# Patient Record
Sex: Female | Born: 1945 | Race: White | Hispanic: No | Smoking: Never smoker
Health system: Southern US, Community
[De-identification: ages and names within clinical notes are randomized; demographics above are authoritative.]

## PROBLEM LIST (undated history)

## (undated) DIAGNOSIS — T7840XA Allergy, unspecified, initial encounter: Secondary | ICD-10-CM

## (undated) DIAGNOSIS — I10 Essential (primary) hypertension: Secondary | ICD-10-CM

## (undated) DIAGNOSIS — Z8489 Family history of other specified conditions: Secondary | ICD-10-CM

## (undated) DIAGNOSIS — F329 Major depressive disorder, single episode, unspecified: Secondary | ICD-10-CM

## (undated) DIAGNOSIS — B019 Varicella without complication: Secondary | ICD-10-CM

## (undated) DIAGNOSIS — K635 Polyp of colon: Secondary | ICD-10-CM

## (undated) DIAGNOSIS — S46012A Strain of muscle(s) and tendon(s) of the rotator cuff of left shoulder, initial encounter: Secondary | ICD-10-CM

## (undated) DIAGNOSIS — F32A Depression, unspecified: Secondary | ICD-10-CM

## (undated) DIAGNOSIS — K5790 Diverticulosis of intestine, part unspecified, without perforation or abscess without bleeding: Secondary | ICD-10-CM

## (undated) DIAGNOSIS — K224 Dyskinesia of esophagus: Secondary | ICD-10-CM

## (undated) DIAGNOSIS — K219 Gastro-esophageal reflux disease without esophagitis: Secondary | ICD-10-CM

## (undated) DIAGNOSIS — K5792 Diverticulitis of intestine, part unspecified, without perforation or abscess without bleeding: Secondary | ICD-10-CM

## (undated) DIAGNOSIS — K222 Esophageal obstruction: Secondary | ICD-10-CM

## (undated) DIAGNOSIS — M4302 Spondylolysis, cervical region: Secondary | ICD-10-CM

## (undated) DIAGNOSIS — R011 Cardiac murmur, unspecified: Secondary | ICD-10-CM

## (undated) DIAGNOSIS — M1990 Unspecified osteoarthritis, unspecified site: Secondary | ICD-10-CM

## (undated) DIAGNOSIS — H40009 Preglaucoma, unspecified, unspecified eye: Secondary | ICD-10-CM

## (undated) DIAGNOSIS — H269 Unspecified cataract: Secondary | ICD-10-CM

## (undated) DIAGNOSIS — F419 Anxiety disorder, unspecified: Secondary | ICD-10-CM

## (undated) DIAGNOSIS — M810 Age-related osteoporosis without current pathological fracture: Secondary | ICD-10-CM

## (undated) HISTORY — PX: NECK SURGERY: SHX720

## (undated) HISTORY — DX: Diverticulitis of intestine, part unspecified, without perforation or abscess without bleeding: K57.92

## (undated) HISTORY — DX: Anxiety disorder, unspecified: F41.9

## (undated) HISTORY — DX: Polyp of colon: K63.5

## (undated) HISTORY — DX: Depression, unspecified: F32.A

## (undated) HISTORY — DX: Major depressive disorder, single episode, unspecified: F32.9

## (undated) HISTORY — DX: Allergy, unspecified, initial encounter: T78.40XA

## (undated) HISTORY — DX: Age-related osteoporosis without current pathological fracture: M81.0

## (undated) HISTORY — DX: Gastro-esophageal reflux disease without esophagitis: K21.9

## (undated) HISTORY — DX: Varicella without complication: B01.9

## (undated) HISTORY — DX: Essential (primary) hypertension: I10

## (undated) HISTORY — DX: Unspecified cataract: H26.9

## (undated) HISTORY — DX: Diverticulosis of intestine, part unspecified, without perforation or abscess without bleeding: K57.90

## (undated) HISTORY — DX: Esophageal obstruction: K22.2

## (undated) HISTORY — DX: Cardiac murmur, unspecified: R01.1

---

## 1988-03-29 HISTORY — PX: HERNIA REPAIR: SHX51

## 1994-03-29 HISTORY — PX: OTHER SURGICAL HISTORY: SHX169

## 1998-03-29 HISTORY — PX: EYE SURGERY: SHX253

## 1998-07-24 ENCOUNTER — Other Ambulatory Visit: Admission: RE | Admit: 1998-07-24 | Discharge: 1998-07-24 | Payer: Self-pay | Admitting: Oral Surgery

## 1999-02-17 ENCOUNTER — Ambulatory Visit (HOSPITAL_COMMUNITY): Admission: RE | Admit: 1999-02-17 | Discharge: 1999-02-18 | Payer: Self-pay | Admitting: Ophthalmology

## 1999-02-17 ENCOUNTER — Encounter: Payer: Self-pay | Admitting: Ophthalmology

## 1999-09-16 ENCOUNTER — Other Ambulatory Visit: Admission: RE | Admit: 1999-09-16 | Discharge: 1999-09-16 | Payer: Self-pay | Admitting: Obstetrics and Gynecology

## 2000-03-23 ENCOUNTER — Other Ambulatory Visit: Admission: RE | Admit: 2000-03-23 | Discharge: 2000-03-23 | Payer: Self-pay | Admitting: Obstetrics and Gynecology

## 2000-09-05 ENCOUNTER — Encounter: Admission: RE | Admit: 2000-09-05 | Discharge: 2000-09-05 | Payer: Self-pay | Admitting: Internal Medicine

## 2000-09-05 ENCOUNTER — Encounter: Payer: Self-pay | Admitting: Internal Medicine

## 2000-10-10 ENCOUNTER — Other Ambulatory Visit: Admission: RE | Admit: 2000-10-10 | Discharge: 2000-10-10 | Payer: Self-pay | Admitting: Obstetrics and Gynecology

## 2001-04-17 ENCOUNTER — Other Ambulatory Visit: Admission: RE | Admit: 2001-04-17 | Discharge: 2001-04-17 | Payer: Self-pay | Admitting: Obstetrics and Gynecology

## 2002-03-19 ENCOUNTER — Other Ambulatory Visit: Admission: RE | Admit: 2002-03-19 | Discharge: 2002-03-19 | Payer: Self-pay | Admitting: Obstetrics and Gynecology

## 2004-04-13 ENCOUNTER — Other Ambulatory Visit: Admission: RE | Admit: 2004-04-13 | Discharge: 2004-04-13 | Payer: Self-pay | Admitting: Obstetrics and Gynecology

## 2005-06-10 ENCOUNTER — Other Ambulatory Visit: Admission: RE | Admit: 2005-06-10 | Discharge: 2005-06-10 | Payer: Self-pay | Admitting: Obstetrics and Gynecology

## 2005-06-17 ENCOUNTER — Encounter: Admission: RE | Admit: 2005-06-17 | Discharge: 2005-06-17 | Payer: Self-pay | Admitting: Internal Medicine

## 2005-07-16 ENCOUNTER — Ambulatory Visit: Payer: Self-pay | Admitting: Internal Medicine

## 2005-07-27 LAB — HM COLONOSCOPY

## 2005-07-29 ENCOUNTER — Ambulatory Visit: Payer: Self-pay | Admitting: Internal Medicine

## 2008-10-09 ENCOUNTER — Encounter: Admission: RE | Admit: 2008-10-09 | Discharge: 2008-10-09 | Payer: Self-pay | Admitting: Obstetrics and Gynecology

## 2009-11-25 ENCOUNTER — Encounter: Admission: RE | Admit: 2009-11-25 | Discharge: 2009-11-25 | Payer: Self-pay | Admitting: Obstetrics and Gynecology

## 2010-01-02 ENCOUNTER — Emergency Department (HOSPITAL_BASED_OUTPATIENT_CLINIC_OR_DEPARTMENT_OTHER): Admission: EM | Admit: 2010-01-02 | Discharge: 2010-01-02 | Payer: Self-pay | Admitting: Emergency Medicine

## 2010-06-11 LAB — COMPREHENSIVE METABOLIC PANEL
AST: 24 U/L (ref 0–37)
BUN: 18 mg/dL (ref 6–23)
CO2: 25 mEq/L (ref 19–32)
Calcium: 8.6 mg/dL (ref 8.4–10.5)
Creatinine, Ser: 0.6 mg/dL (ref 0.4–1.2)
GFR calc Af Amer: >60 mL/min (ref >60)
GFR calc non Af Amer: >60 mL/min (ref >60)
Total Bilirubin: 0.6 mg/dL (ref 0.3–1.2)

## 2010-06-11 LAB — CBC
HCT: 38.7 % (ref 36.0–46.0)
Hemoglobin: 12.8 g/dL (ref 12.0–15.0)
MCH: 30.5 pg (ref 26.0–34.0)
MCHC: 33 g/dL (ref 30.0–36.0)
MCV: 92 fL (ref 78.0–100.0)
Platelets: 300 10*3/uL (ref 150–400)
RBC: 4.19 MIL/uL (ref 3.87–5.11)
RDW: 11.6 % (ref 11.5–15.5)
WBC: 9.6 10*3/uL (ref 4.0–10.5)

## 2010-06-11 LAB — DIFFERENTIAL
Blasts: 0 %
Eosinophils Absolute: 0.3 10*3/uL (ref 0.0–0.7)
Eosinophils Relative: 3 % (ref 0–5)
Lymphocytes Relative: 16 % (ref 12–46)
Lymphs Abs: 1.5 10*3/uL (ref 0.7–4.0)
Monocytes Absolute: 0.4 10*3/uL (ref 0.1–1.0)
Monocytes Relative: 4 % (ref 3–12)
Neutro Abs: 7.3 10*3/uL (ref 1.7–7.7)
Neutrophils Relative %: 76 % (ref 43–77)
nRBC: 0 /100 WBC

## 2010-11-09 ENCOUNTER — Other Ambulatory Visit: Payer: Self-pay | Admitting: Obstetrics and Gynecology

## 2010-11-09 DIAGNOSIS — Z1231 Encounter for screening mammogram for malignant neoplasm of breast: Secondary | ICD-10-CM

## 2010-12-01 ENCOUNTER — Ambulatory Visit
Admission: RE | Admit: 2010-12-01 | Discharge: 2010-12-01 | Disposition: A | Discharge status: Home / Self Care | Payer: BC Managed Care – PPO | Source: Ambulatory Visit | Attending: Obstetrics and Gynecology | Admitting: Obstetrics and Gynecology

## 2010-12-01 DIAGNOSIS — Z1231 Encounter for screening mammogram for malignant neoplasm of breast: Secondary | ICD-10-CM

## 2011-12-08 ENCOUNTER — Other Ambulatory Visit: Payer: Self-pay | Admitting: Obstetrics and Gynecology

## 2011-12-08 DIAGNOSIS — Z1231 Encounter for screening mammogram for malignant neoplasm of breast: Secondary | ICD-10-CM

## 2011-12-10 ENCOUNTER — Ambulatory Visit
Admission: RE | Admit: 2011-12-10 | Discharge: 2011-12-10 | Disposition: A | Discharge status: Home / Self Care | Payer: BC Managed Care – PPO | Source: Ambulatory Visit | Attending: Obstetrics and Gynecology | Admitting: Obstetrics and Gynecology

## 2011-12-10 DIAGNOSIS — Z1231 Encounter for screening mammogram for malignant neoplasm of breast: Secondary | ICD-10-CM

## 2011-12-14 ENCOUNTER — Other Ambulatory Visit: Payer: Self-pay | Admitting: Obstetrics and Gynecology

## 2011-12-14 DIAGNOSIS — R928 Other abnormal and inconclusive findings on diagnostic imaging of breast: Secondary | ICD-10-CM

## 2011-12-15 ENCOUNTER — Ambulatory Visit
Admission: RE | Admit: 2011-12-15 | Discharge: 2011-12-15 | Disposition: A | Discharge status: Home / Self Care | Payer: BC Managed Care – PPO | Source: Ambulatory Visit | Attending: Obstetrics and Gynecology | Admitting: Obstetrics and Gynecology

## 2011-12-15 ENCOUNTER — Other Ambulatory Visit: Payer: Self-pay | Admitting: Obstetrics and Gynecology

## 2011-12-15 DIAGNOSIS — R928 Other abnormal and inconclusive findings on diagnostic imaging of breast: Secondary | ICD-10-CM

## 2012-11-10 ENCOUNTER — Other Ambulatory Visit: Payer: Self-pay

## 2012-11-10 DIAGNOSIS — Z1231 Encounter for screening mammogram for malignant neoplasm of breast: Secondary | ICD-10-CM

## 2012-11-23 ENCOUNTER — Ambulatory Visit (INDEPENDENT_AMBULATORY_CARE_PROVIDER_SITE_OTHER): Payer: BC Managed Care – PPO | Admitting: Family Medicine

## 2012-11-23 ENCOUNTER — Encounter: Payer: Self-pay | Admitting: Family Medicine

## 2012-11-23 VITALS — BP 118/80 | HR 74 | Temp 97.9°F | Ht 60.0 in | Wt 122.0 lb

## 2012-11-23 DIAGNOSIS — I10 Essential (primary) hypertension: Secondary | ICD-10-CM

## 2012-11-23 DIAGNOSIS — M546 Pain in thoracic spine: Secondary | ICD-10-CM

## 2012-11-23 DIAGNOSIS — R002 Palpitations: Secondary | ICD-10-CM

## 2012-11-23 DIAGNOSIS — Z8659 Personal history of other mental and behavioral disorders: Secondary | ICD-10-CM

## 2012-11-23 DIAGNOSIS — M545 Low back pain, unspecified: Secondary | ICD-10-CM

## 2012-11-23 DIAGNOSIS — K5732 Diverticulitis of large intestine without perforation or abscess without bleeding: Secondary | ICD-10-CM

## 2012-11-23 NOTE — Progress Notes (Signed)
  Subjective:    Patient ID: Heidi Shah, female    DOB: 1945/09/20, 67 y.o.   MRN: [id number]  HPI Patient seen to establish care Past medical history significant for remote depression, diverticulitis, hypertension. She is treated for hypertension with lisinopril HCTZ. Blood pressures have been well controlled. She's had previous surgeries as outlined below. 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 She is divorced. Smoked only briefly during college. Drinks about one glass of one per night. Exercises regularly.   she has the following issues:  Palpitations. She has not had any for 6 months but had previous episodes usually transient lasting about 30 seconds of sensation of increased heart rate associated with dizziness and presyncope but no syncope episodes. No chest pain. No clear provoking factors. She's not sure she's ever had even EKG  Left lumbar back pain. She has known scoliosis. She has a fullness left lumbar area and lower thoracic region. Has never had x-rays. She's had some relief with massage therapy. No appetite or weight changes. No radiculopathy symptoms  past history of frequent loose stools. She has recently made some dietary changes and symptoms have improved somewhat with that. She's had previous colonoscopy 2007- diverticular changes otherwise normal  Past Medical History  Diagnosis Date  . Chicken pox   . Depression   . Diverticulitis   . Hypertension    Past Surgical History  Procedure Laterality Date  . Cataract surgery Bilateral 1996  . Hernia repair  1990    reports that she has never smoked. She does not have any smokeless tobacco history on file. She reports that  drinks alcohol. She reports that she does not use illicit drugs. family history includes Cancer in her father; Heart disease (age of onset: 63) in her mother; Heart disease (age of onset: 59) in her father. Allergies  Allergen Reactions  . Penicillins             Review of  Systems  Constitutional: Negative for appetite change, fatigue and unexpected weight change.  Eyes: Negative for visual disturbance.  Respiratory: Negative for cough, chest tightness, shortness of breath and wheezing.   Cardiovascular: Positive for palpitations. Negative for chest pain and leg swelling.  Gastrointestinal: Negative for abdominal pain.  Endocrine: Negative for polydipsia and polyuria.  Genitourinary: Negative for dysuria.  Musculoskeletal: Positive for back pain.  Neurological: Negative for dizziness, seizures, syncope, weakness, light-headedness and headaches.       Objective:   Physical Exam  Constitutional: She is oriented to person, place, and time. She appears well-developed and well-nourished.  Neck: Neck supple. No thyromegaly present.  Cardiovascular: Normal rate and regular rhythm.  Exam reveals no gallop.   No murmur heard. Pulmonary/Chest: Effort normal and breath sounds normal. No respiratory distress. She has no wheezes. She has no rales.  Musculoskeletal: She exhibits no edema.  Patient has some curvature of the spine. She has bony fullness left lower thoracic and upper lumbar region. Minimally tender to palpation  Neurological: She is alert and oriented to person, place, and time.          Assessment & plan   #1 hypertension. Well controlled #2 history of diverticulitis currently stable #3 history of palpitations with presyncopal-type symptoms. She has not episode now for 6 months. Start with basic EKG. We discussed possible event monitor but these symptoms are very infrequent and would be difficult to capture. #4 left lumbar back pain. Given duration of symptoms start with some plain films

## 2012-11-23 NOTE — Patient Instructions (Addendum)
Schedule complete physical. 

## 2012-12-05 ENCOUNTER — Ambulatory Visit (INDEPENDENT_AMBULATORY_CARE_PROVIDER_SITE_OTHER)
Admission: RE | Admit: 2012-12-05 | Discharge: 2012-12-05 | Disposition: A | Discharge status: Home / Self Care | Payer: BC Managed Care – PPO | Source: Ambulatory Visit | Attending: Family Medicine | Admitting: Family Medicine

## 2012-12-05 DIAGNOSIS — M545 Low back pain, unspecified: Secondary | ICD-10-CM

## 2012-12-05 DIAGNOSIS — M546 Pain in thoracic spine: Secondary | ICD-10-CM

## 2012-12-05 LAB — HM PAP SMEAR: HM Pap smear: NEGATIVE

## 2012-12-11 ENCOUNTER — Ambulatory Visit
Admission: RE | Admit: 2012-12-11 | Discharge: 2012-12-11 | Disposition: A | Discharge status: Home / Self Care | Payer: BC Managed Care – PPO | Source: Ambulatory Visit

## 2012-12-11 DIAGNOSIS — Z1231 Encounter for screening mammogram for malignant neoplasm of breast: Secondary | ICD-10-CM

## 2012-12-12 ENCOUNTER — Other Ambulatory Visit: Payer: Self-pay | Admitting: Obstetrics and Gynecology

## 2012-12-12 DIAGNOSIS — R928 Other abnormal and inconclusive findings on diagnostic imaging of breast: Secondary | ICD-10-CM

## 2012-12-20 ENCOUNTER — Other Ambulatory Visit (INDEPENDENT_AMBULATORY_CARE_PROVIDER_SITE_OTHER): Payer: BC Managed Care – PPO

## 2012-12-20 DIAGNOSIS — R7989 Other specified abnormal findings of blood chemistry: Secondary | ICD-10-CM

## 2012-12-20 DIAGNOSIS — Z0000 Encounter for general adult medical examination without abnormal findings: Secondary | ICD-10-CM

## 2012-12-20 LAB — CBC WITH DIFFERENTIAL/PLATELET
Basophils Absolute: 0 10*3/uL (ref 0.0–0.1)
Eosinophils Absolute: 0.2 10*3/uL (ref 0.0–0.7)
Hemoglobin: 13.7 g/dL (ref 12.0–15.0)
Lymphocytes Relative: 43.2 % (ref 12.0–46.0)
Lymphs Abs: 2 10*3/uL (ref 0.7–4.0)
MCHC: 33.9 g/dL (ref 30.0–36.0)
Monocytes Relative: 11.2 % (ref 3.0–12.0)
Neutro Abs: 1.8 10*3/uL (ref 1.4–7.7)
Platelets: 288 10*3/uL (ref 150.0–400.0)
RDW: 12.8 % (ref 11.5–14.6)

## 2012-12-20 LAB — BASIC METABOLIC PANEL
BUN: 16 mg/dL (ref 6–23)
CO2: 30 mEq/L (ref 19–32)
Calcium: 9.4 mg/dL (ref 8.4–10.5)
GFR: 115.52 mL/min (ref >60.00)
Glucose, Bld: 86 mg/dL (ref 70–99)

## 2012-12-20 LAB — LIPID PANEL
Cholesterol: 263 mg/dL — ABNORMAL HIGH (ref 0–200)
HDL: 87.9 mg/dL (ref >39.00)
Triglycerides: 47 mg/dL (ref 0.0–149.0)

## 2012-12-20 LAB — POCT URINALYSIS DIPSTICK
Bilirubin, UA: NEGATIVE
Glucose, UA: NEGATIVE
Nitrite, UA: NEGATIVE
Urobilinogen, UA: 0.2

## 2012-12-20 LAB — HEPATIC FUNCTION PANEL
AST: 24 U/L (ref 0–37)
Albumin: 4.2 g/dL (ref 3.5–5.2)
Alkaline Phosphatase: 38 U/L — ABNORMAL LOW (ref 39–117)
Total Bilirubin: 0.6 mg/dL (ref 0.3–1.2)

## 2012-12-20 LAB — TSH: TSH: 1.09 u[IU]/mL (ref 0.35–5.50)

## 2012-12-21 ENCOUNTER — Ambulatory Visit
Admission: RE | Admit: 2012-12-21 | Discharge: 2012-12-21 | Disposition: A | Discharge status: Home / Self Care | Payer: BC Managed Care – PPO | Source: Ambulatory Visit | Attending: Obstetrics and Gynecology | Admitting: Obstetrics and Gynecology

## 2012-12-21 DIAGNOSIS — R928 Other abnormal and inconclusive findings on diagnostic imaging of breast: Secondary | ICD-10-CM

## 2012-12-27 ENCOUNTER — Encounter: Payer: Self-pay | Admitting: Family Medicine

## 2012-12-27 ENCOUNTER — Ambulatory Visit (INDEPENDENT_AMBULATORY_CARE_PROVIDER_SITE_OTHER): Payer: BC Managed Care – PPO | Admitting: Family Medicine

## 2012-12-27 VITALS — BP 128/82 | HR 92 | Temp 98.2°F | Ht 60.0 in | Wt 124.0 lb

## 2012-12-27 DIAGNOSIS — R319 Hematuria, unspecified: Secondary | ICD-10-CM

## 2012-12-27 DIAGNOSIS — Z23 Encounter for immunization: Secondary | ICD-10-CM

## 2012-12-27 DIAGNOSIS — Z0000 Encounter for general adult medical examination without abnormal findings: Secondary | ICD-10-CM

## 2012-12-27 LAB — POCT URINALYSIS DIPSTICK
Bilirubin, UA: NEGATIVE
Glucose, UA: NEGATIVE
Nitrite, UA: NEGATIVE
Protein, UA: NEGATIVE
Spec Grav, UA: 1.01
Urobilinogen, UA: 0.2
pH, UA: 6

## 2012-12-27 MED ORDER — LISINOPRIL-HYDROCHLOROTHIAZIDE 10-12.5 MG PO TABS: 1.0000 | ORAL_TABLET | Freq: Every day | ORAL | Status: DC

## 2012-12-27 NOTE — Progress Notes (Signed)
  Subjective:    Patient ID: Heidi Shah, female    DOB: [birth year], 67 y.o.   MRN: [id number]  HPI Patient is here for complete physical She continues see gynecologist regularly for Pap smears and mammograms. She has not had flu vaccine yet. No history of Pneumovax. She has hypertension which is been well controlled with lisinopril HCTZ. She had colonoscopy back in 2007 which was normal. She exercises regularly with walking.  Past Medical History  Diagnosis Date  . Chicken pox   . Depression   . Diverticulitis   . Hypertension    Past Surgical History  Procedure Laterality Date  . Cataract surgery Bilateral 1996  . Hernia repair  1990    reports that she has never smoked. She does not have any smokeless tobacco history on file. She reports that  drinks alcohol. She reports that she does not use illicit drugs. family history includes COPD in her mother; Cancer in her father; Heart disease (age of onset: 73) in her mother; Heart disease (age of onset: 46) in her father. Allergies  Allergen Reactions  . Penicillins       Review of Systems  Constitutional: Negative for fever, activity change, appetite change, fatigue and unexpected weight change.  HENT: Negative for hearing loss, ear pain, sore throat and trouble swallowing.   Eyes: Negative for visual disturbance.  Respiratory: Negative for cough and shortness of breath.   Cardiovascular: Negative for chest pain and palpitations.  Gastrointestinal: Negative for abdominal pain, diarrhea, constipation and blood in stool.  Genitourinary: Negative for dysuria and hematuria.  Musculoskeletal: Negative for myalgias, back pain and arthralgias.  Skin: Negative for rash.  Neurological: Negative for dizziness, syncope and headaches.  Hematological: Negative for adenopathy.  Psychiatric/Behavioral: Negative for confusion and dysphoric mood.       Objective:   Physical Exam  Constitutional: She is oriented to person, place,  and time. She appears well-developed and well-nourished.  HENT:  Head: Normocephalic and atraumatic.  Eyes: EOM are normal. Pupils are equal, round, and reactive to light.  Neck: Normal range of motion. Neck supple. No thyromegaly present.  Cardiovascular: Normal rate, regular rhythm and normal heart sounds.   No murmur heard. Pulmonary/Chest: Breath sounds normal. No respiratory distress. She has no wheezes. She has no rales.  Abdominal: Soft. Bowel sounds are normal. She exhibits no distension and no mass. There is no tenderness. There is no rebound and no guarding.  Genitourinary:  Per GYN  Musculoskeletal: Normal range of motion. She exhibits no edema.  Lymphadenopathy:    She has no cervical adenopathy.  Neurological: She is alert and oriented to person, place, and time. She displays normal reflexes. No cranial nerve deficit.  Skin: No rash noted.  Psychiatric: She has a normal mood and affect. Her behavior is normal. Judgment and thought content normal.          Assessment & Plan:  Healthy 67 year old female. She'll continue GYN followup. Flu vaccine and Pneumovax given. Repeat urine with recent hematuria on urine dipstick. She will need repeat colonoscopy in 3 years

## 2012-12-27 NOTE — Patient Instructions (Addendum)
Continue with regular weightbearing exercise Continue with daily calcium consumption 1200 mg and vitamin D [phone] international units daily

## 2012-12-28 ENCOUNTER — Telehealth: Payer: Self-pay

## 2012-12-28 LAB — URINALYSIS, MICROSCOPIC ONLY
Bacteria, UA: NONE SEEN
Crystals: NONE SEEN
Squamous Epithelial / LPF: NONE SEEN

## 2012-12-28 MED ORDER — ALPRAZOLAM 0.25 MG PO TABS: ORAL_TABLET | ORAL | Status: DC

## 2012-12-28 NOTE — Telephone Encounter (Signed)
May refill once but would avoid regular use to avoid dependency.

## 2012-12-28 NOTE — Telephone Encounter (Signed)
Xanax refill Patient was getting this RX filled by another doctor, and she wants to know if she can start getting it filled here. She only wants #30  Patient was seen 12/27/2012

## 2012-12-28 NOTE — Telephone Encounter (Signed)
RX called into pharmacy

## 2013-01-19 ENCOUNTER — Ambulatory Visit (INDEPENDENT_AMBULATORY_CARE_PROVIDER_SITE_OTHER): Payer: BC Managed Care – PPO | Admitting: Internal Medicine

## 2013-01-19 ENCOUNTER — Encounter: Payer: Self-pay | Admitting: Internal Medicine

## 2013-01-19 VITALS — BP 144/90 | HR 64 | Temp 98.1°F | Wt 123.0 lb

## 2013-01-19 DIAGNOSIS — K224 Dyskinesia of esophagus: Secondary | ICD-10-CM | POA: Insufficient documentation

## 2013-01-19 DIAGNOSIS — K219 Gastro-esophageal reflux disease without esophagitis: Secondary | ICD-10-CM | POA: Insufficient documentation

## 2013-01-19 DIAGNOSIS — R1310 Dysphagia, unspecified: Secondary | ICD-10-CM | POA: Insufficient documentation

## 2013-01-19 NOTE — Patient Instructions (Addendum)
This   Sounds  Like esophageal spasm  Acid related but concerned about poss narrowing of esophagus.  Stop aspirin   Stop the tums. Can cause acid rebound. Begin prilosec otc   Or nexium every day   And can add on  Zantac 150 mg  twice a day.   Will arrange Gi referral for the reasons we discussed .   Diet for Gastroesophageal Reflux Disease, Adult Reflux (acid reflux) is when acid from your stomach flows up into the esophagus. When acid comes in contact with the esophagus, the acid causes irritation and soreness (inflammation) in the esophagus. When reflux happens often or so severely that it causes damage to the esophagus, it is called gastroesophageal reflux disease (GERD). Nutrition therapy can help ease the discomfort of GERD. FOODS OR DRINKS TO AVOID OR LIMIT  Smoking or chewing tobacco. Nicotine is one of the most potent stimulants to acid production in the gastrointestinal tract.  Caffeinated and decaffeinated coffee and black tea.  Regular or low-calorie carbonated beverages or energy drinks (caffeine-free carbonated beverages are allowed).   Strong spices, such as black pepper, white pepper, red pepper, cayenne, curry powder, and chili powder.  Peppermint or spearmint.  Chocolate.  High-fat foods, including meats and fried foods. Extra added fats including oils, butter, salad dressings, and nuts. Limit these to less than 8 tsp per day.  Fruits and vegetables if they are not tolerated, such as citrus fruits or tomatoes.  Alcohol.  Any food that seems to aggravate your condition. If you have questions regarding your diet, call your caregiver or a registered dietitian. OTHER THINGS THAT MAY HELP GERD INCLUDE:   Eating your meals slowly, in a relaxed setting.  Eating 5 to 6 small meals per day instead of 3 large meals.  Eliminating food for a period of time if it causes distress.  Not lying down until 3 hours after eating a meal.  Keeping the head of your bed raised 6  to 9 inches (15 to 23 cm) by using a foam wedge or blocks under the legs of the bed. Lying flat may make symptoms worse.  Being physically active. Weight loss may be helpful in reducing reflux in overweight or obese adults.  Wear loose fitting clothing EXAMPLE MEAL PLAN This meal plan is approximately 2,000 calories based on https://www.bernard.org/ meal planning guidelines. Breakfast   cup cooked oatmeal.  1 cup strawberries.  1 cup low-fat milk.  1 oz almonds. Snack  1 cup cucumber slices.  6 oz yogurt (made from low-fat or fat-free milk). Lunch  2 slice whole-wheat bread.  2 oz sliced [place].  2 tsp mayonnaise.  1 cup blueberries.  1 cup snap peas. Snack  6 whole-wheat crackers.  1 oz string cheese. Dinner   cup brown rice.  1 cup mixed veggies.  1 tsp olive oil.  3 oz grilled fish. Document Released: 03/15/2005 Document Revised: 06/07/2011 Document Reviewed: 01/29/2011 [facility] Patient Information 2014 [place], [place].

## 2013-01-19 NOTE — Progress Notes (Signed)
Chief Complaint  Patient presents with  . Abdominal Pain    Has tried tums and soft drinks for the carbonation. Not helping.  Started on Wednesday.  Karie Fetch Reflux    HPI: Patient comes in today for SDA for  new problem evaluation. PCP NA  New sudden onset this week of  Pain high epigastrum after eating  When eats food heurts and get caught  Solids worse than liquids "burns" no radiation . Taking tums.so far.  ? remote hx of   Endoscopy.  No chronic gerd described  Onset 2 days ago.  Never had this before  Years ago had some prilosec for years. But none needed  ROS: See pertinent positives and negatives per HPI. No other chest pain shortness of breath cardiovascular symptoms. No change in bowel habits blood in stool is leaving town soon. Uncertain what to take. Doesn't take 1 Advil Aleve does take a baby aspirin a day. Denies any choking. No unusual weight loss.  Past Medical History  Diagnosis Date  . Chicken pox   . Depression   . Diverticulitis   . Hypertension     Family History  Problem Relation Age of Onset  . Heart disease Mother 31  . COPD Mother   . Heart disease Father 97  . Cancer Father     kidney    History   Social History  . Marital Status: Legally Separated    Spouse Name: N/A    Number of Children: N/A  . Years of Education: N/A   Social History Main Topics  . Smoking status: Never Smoker   . Smokeless tobacco: None  . Alcohol Use: Yes  . Drug Use: No  . Sexual Activity: None   Other Topics Concern  . None   Social History Narrative  . None    Outpatient Encounter Prescriptions as of 01/19/2013  Medication Sig Dispense Refill  . ALPRAZolam (XANAX) 0.25 MG tablet Take 1 tablet by mouth twice daily as needed  30 tablet  0  . aspirin 81 MG tablet Take 81 mg by mouth daily.      . cetirizine (ZYRTEC) 10 MG tablet Take 10 mg by mouth daily.      . Cholecalciferol (D3 ADULT PO) Take by mouth. daily      . lisinopril-hydrochlorothiazide  (PRINZIDE,ZESTORETIC) 10-12.5 MG per tablet Take 1 tablet by mouth daily.  90 tablet  3  . saccharomyces boulardii (FLORASTOR) 250 MG capsule Take 250 mg by mouth daily.      . vitamin C (ASCORBIC ACID) 500 MG tablet Take 500 mg by mouth daily.       No facility-administered encounter medications on file as of 01/19/2013.    EXAM:  BP 144/90  Pulse 64  Temp(Src) 98.1 F (36.7 C) (Oral)  Wt 123 lb (55.792 kg)  BMI 24.02 kg/m2  SpO2 98%  Body mass index is 24.02 kg/(m^2).  GENERAL: vitals reviewed and listed above, alert, oriented, appears well hydrated and in no acute distress HEENT: atraumatic, conjunctiva  clear, no obvious abnormalities on inspection of external nose and ears OP : no lesion edema or exudate  NECK: no obvious masses on inspection palpation no adenopathy LUNGS: clear to auscultation bilaterally, no wheezes, rales or rhonchi, good air movement CV: HRRR, no clubbing cyanosis or  peripheral edema nl cap refill  Abdomen:  Sof,t normal bowel sounds without hepatosplenomegaly, no guarding rebound or masses no CVA tenderness has some tenderness in the high epigastrium without guarding or  rebound no masses are felt. 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 -Patient advised to return or notify health care team  if symptoms worsen or persist or new concerns arise. 2 weeks  Fu  Also  Gi referral.  Patient Instructions  This   Sounds  Like esophageal spasm  Acid related but concerned about poss narrowing of esophagus.  Stop  aspirin   Stop the tums. Can cause acid rebound. Begin prilosec otc   Or nexium every day   And can add on  Zantac 150 mg  twice a day.   Will arrange Gi referral for the reasons we discussed .   Diet for Gastroesophageal Reflux Disease, Adult Reflux (acid reflux) is when acid from your stomach flows up into the esophagus. When acid comes in contact with the esophagus, the acid causes irritation and soreness (inflammation) in the esophagus. When reflux happens often or so severely that it causes damage to the esophagus, it is called gastroesophageal reflux disease (GERD). Nutrition therapy can help ease the discomfort of GERD. FOODS OR DRINKS TO AVOID OR LIMIT  Smoking or chewing tobacco. Nicotine is one of the most potent stimulants to acid production in the gastrointestinal tract.  Caffeinated and decaffeinated coffee and black tea.  Regular or low-calorie carbonated beverages or energy drinks (caffeine-free carbonated beverages are allowed).   Strong spices, such as black pepper, white pepper, red pepper, cayenne, curry powder, and chili powder.  Peppermint or spearmint.  Chocolate.  High-fat foods, including meats and fried foods. Extra added fats including oils, butter, salad dressings, and nuts. Limit these to less than 8 tsp per day.  Fruits and vegetables if they are not tolerated, such as citrus fruits or tomatoes.  Alcohol.  Any food that seems to aggravate your condition. If you have questions regarding your diet, call your caregiver or a registered dietitian. OTHER THINGS THAT MAY HELP GERD INCLUDE:   Eating your meals slowly, in a relaxed setting.  Eating 5 to 6 small meals per day instead of 3 large meals.  Eliminating food for a period of time if it causes distress.  Not lying down until 3 hours after eating a meal.  Keeping the head of your bed raised 6 to 9 inches (15 to 23 cm) by using a foam wedge or blocks under the legs of the bed. Lying flat may make  symptoms worse.  Being physically active. Weight loss may be helpful in reducing reflux in overweight or obese adults.  Wear loose fitting clothing EXAMPLE MEAL PLAN This meal plan is approximately 2,000 calories based on https://www.bernard.org/ meal planning guidelines. Breakfast   cup cooked oatmeal.  1 cup strawberries.  1 cup low-fat milk.  1 oz almonds. Snack  1 cup cucumber slices.  6 oz yogurt (made from low-fat or fat-free milk). Lunch  2 slice whole-wheat bread.  2 oz sliced [place].  2 tsp mayonnaise.  1 cup blueberries.  1 cup snap peas. Snack  6 whole-wheat crackers.  1 oz string cheese. Dinner   cup brown rice.  1 cup mixed veggies.  1 tsp olive oil.  3 oz grilled fish. Document Released:  03/15/2005 Document Revised: 06/07/2011 Document Reviewed: 01/29/2011 [employer] Patient Information 2014 [place], [place].         Neta Mends. Panosh M.D.

## 2013-01-31 ENCOUNTER — Telehealth: Payer: Self-pay | Admitting: Internal Medicine

## 2013-01-31 NOTE — Telephone Encounter (Signed)
Pt had colon done with Dr. Marina Goodell 07/29/05. Pt has been referred for GERD. Pt would like to switch from Dr. Marina Goodell to Dr. Arlyce Dice, states Dr. Arlyce Dice was her father's doctor. Dr. Marina Goodell will you approve the switch? Please advise.

## 2013-02-01 ENCOUNTER — Encounter: Payer: Self-pay | Admitting: Gastroenterology

## 2013-02-01 NOTE — Telephone Encounter (Signed)
Sure, no problem. 

## 2013-02-01 NOTE — Telephone Encounter (Signed)
Please schedule pt to see Dr. Arlyce Dice, see notes below.

## 2013-02-01 NOTE — Telephone Encounter (Signed)
Dr. Kaplan will you accept this pt? 

## 2013-02-01 NOTE — Telephone Encounter (Signed)
Lm for pt to call to schedule

## 2013-02-01 NOTE — Telephone Encounter (Signed)
Pt is scheduled for an OV with Dr. Arlyce Dice on 03-05-13.

## 2013-02-01 NOTE — Telephone Encounter (Signed)
Ok

## 2013-03-05 ENCOUNTER — Ambulatory Visit (INDEPENDENT_AMBULATORY_CARE_PROVIDER_SITE_OTHER): Payer: Medicare Other | Admitting: Gastroenterology

## 2013-03-05 ENCOUNTER — Encounter: Payer: Self-pay | Admitting: Gastroenterology

## 2013-03-05 VITALS — BP 122/80 | HR 88 | Ht 59.45 in | Wt 126.2 lb

## 2013-03-05 DIAGNOSIS — K5900 Constipation, unspecified: Secondary | ICD-10-CM | POA: Insufficient documentation

## 2013-03-05 DIAGNOSIS — R1310 Dysphagia, unspecified: Secondary | ICD-10-CM

## 2013-03-05 NOTE — Progress Notes (Signed)
History of Present Illness: Heidi Shah 67 year old white female referred for evaluation of dysphagia.  She's having dysphagia to solids as well as odynophagia.  She takes ranitidine and over-the-counter Prilosec for pyrosis.  Should she miss a day"s worth of therapy she becomes symptomatic.  She also complains of intermittent constipation.  Last colonoscopy apparently was 2007.    Past Medical History  Diagnosis Date  . Chicken pox   . Depression   . Diverticulitis   . Hypertension    Past Surgical History  Procedure Laterality Date  . Cataract surgery Bilateral 1996  . Hernia repair  1990  . Neck surgery      as a child   family history includes COPD in her mother; Cancer in her father; Crohn's disease in her mother; Heart disease (age of onset: 59) in her mother; Heart disease (age of onset: 77) in her father. Current Outpatient Prescriptions  Medication Sig Dispense Refill  . ALPRAZolam (XANAX) 0.25 MG tablet 2 (two) times daily as needed. Take 1 tablet by mouth twice daily as needed      . aspirin 81 MG tablet Take 81 mg by mouth daily.      . cetirizine (ZYRTEC) 10 MG tablet Take 10 mg by mouth daily.      . Cholecalciferol (D3 ADULT PO) Take by mouth. daily      . lisinopril-hydrochlorothiazide (PRINZIDE,ZESTORETIC) 10-12.5 MG per tablet Take 1 tablet by mouth daily.  90 tablet  3  . omeprazole (PRILOSEC OTC) 20 MG tablet Take 20 mg by mouth daily.      Marland Kitchen saccharomyces boulardii (FLORASTOR) 250 MG capsule Take 250 mg by mouth daily.      . vitamin C (ASCORBIC ACID) 500 MG tablet Take 500 mg by mouth daily.       No current facility-administered medications for this visit.   Allergies as of 03/05/2013 - Review Complete 03/05/2013  Allergen Reaction Noted  . Penicillins  11/23/2012    reports that she has never smoked. She has never used smokeless tobacco. She reports that she drinks alcohol. She reports that she does not use illicit drugs.     Review of Systems: Pertinent  positive and negative review of systems were noted in the above HPI section. All other review of systems were otherwise negative.  Vital signs were reviewed in today's medical record Physical Exam: General: Well developed , well nourished, no acute distress Skin: anicteric Head: Normocephalic and atraumatic Eyes:  sclerae anicteric, EOMI Ears: Normal auditory acuity Mouth: No deformity or lesions Neck: Supple, no masses or thyromegaly Lungs: Clear throughout to auscultation Heart: Regular rate and rhythm; no murmurs, rubs or bruits Abdomen: Soft, non tender and non distended. No masses, hepatosplenomegaly or hernias noted. Normal Bowel sounds Rectal:deferred Musculoskeletal: Symmetrical with no gross deformities  Skin: No lesions on visible extremities Pulses:  Normal pulses noted Extremities: No clubbing, cyanosis, edema or deformities noted Neurological: Alert oriented x 4, grossly nonfocal Cervical Nodes:  No significant cervical adenopathy Inguinal Nodes: No significant inguinal adenopathy Psychological:  Alert and cooperative. Normal mood and affect      A reason

## 2013-03-05 NOTE — Assessment & Plan Note (Signed)
Patient has new onset of dysphagia to solids raising the concern for an esophageal stricture.  She also has odynophagia probably do to esophageal inflammation.  Recommendations #1 upper endoscopy with dilatation as indicated #2 trial of dexilant 60 mg daily  Risks, alternatives, and complications of the procedure, including bleeding, perforation, and possible need for surgery, were explained to the patient.  Patient's questions were answered.

## 2013-03-05 NOTE — Patient Instructions (Signed)
You have been scheduled for an endoscopy with propofol. Please follow written instructions given to you at your visit today. If you use inhalers (even only as needed), please bring them with you on the day of your procedure. Your physician has requested that you go to www.startemmi.com and enter the access code given to you at your visit today. This web site gives a general overview about your procedure. However, you should still follow specific instructions given to you by our office regarding your preparation for the procedure.  Dexilant will be sent to your pharmacy

## 2013-03-05 NOTE — Assessment & Plan Note (Signed)
Symptoms are route probably due to functional constipation  Recommendations #1 fiber supplementation

## 2013-03-07 ENCOUNTER — Telehealth: Payer: Self-pay | Admitting: Gastroenterology

## 2013-03-07 ENCOUNTER — Encounter: Payer: Self-pay | Admitting: Gastroenterology

## 2013-03-07 MED ORDER — ESOMEPRAZOLE MAGNESIUM 40 MG PO CPDR: 40.0000 mg | DELAYED_RELEASE_CAPSULE | Freq: Every morning | ORAL | Status: DC

## 2013-03-07 NOTE — Telephone Encounter (Signed)
Dr Retia Passe sending in Nexium for this patient Her insurance will not cover dexilant at this point

## 2013-03-08 NOTE — Telephone Encounter (Signed)
ok 

## 2013-04-20 ENCOUNTER — Encounter: Payer: Self-pay | Admitting: Gastroenterology

## 2013-04-20 ENCOUNTER — Ambulatory Visit (AMBULATORY_SURGERY_CENTER): Payer: Medicare Other | Admitting: Gastroenterology

## 2013-04-20 VITALS — BP 140/93 | HR 74 | Temp 98.8°F | Resp 18 | Ht 59.0 in | Wt 126.0 lb

## 2013-04-20 DIAGNOSIS — R1310 Dysphagia, unspecified: Secondary | ICD-10-CM

## 2013-04-20 DIAGNOSIS — K222 Esophageal obstruction: Secondary | ICD-10-CM

## 2013-04-20 MED ORDER — SODIUM CHLORIDE 0.9 % IV SOLN: 500.0000 mL | INTRAVENOUS | Status: DC

## 2013-04-20 NOTE — Op Note (Signed)
[facility]  [address] [place], [address]   ENDOSCOPY PROCEDURE REPORT  PATIENT: Heidi, Shah.  MR#: [id number] BIRTHDATE: Jan 18, 1946 , 67  yrs. old GENDER: Female ENDOSCOPIST: Inda Castle, MD REFERRED BY:  Carolann Littler, M.D. PROCEDURE DATE:  04/20/2013 PROCEDURE:  EGD, diagnostic ASA CLASS:     Class II INDICATIONS:  Heartburn.   Dysphagia (resolved) MEDICATIONS: MAC sedation, administered by CRNA, propofol (Diprivan) 200mg  IV, and Simethicone 0.6cc PO TOPICAL ANESTHETIC: Cetacaine Spray  DESCRIPTION OF PROCEDURE: After the risks benefits and alternatives of the procedure were thoroughly explained, informed consent was obtained.  The LB GIF-H180 Loaner [id number] endoscope was introduced through the mouth and advanced to the third portion of the duodenum. Without limitations.  The instrument was slowly withdrawn as the mucosa was fully examined.      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 The 9 mm gastroscope easily traversed the stricture.   The remainder of the upper endoscopy exam was otherwise normal. Retroflexed views revealed no abnormalities.     The scope was then withdrawn from the patient and the procedure completed.  COMPLICATIONS: There were no complications. ENDOSCOPIC IMPRESSION: 1.   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  The 9 mm gastroscope easily traversed the stricture. 2.   The remainder of the upper endoscopy exam was otherwise normal  RECOMMENDATIONS: continue Nexium REPEAT EXAM:  eSigned:  Inda Castle, MD 04/20/2013 3:55 PM   CC:

## 2013-04-20 NOTE — Patient Instructions (Signed)
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  Continue Nexium.  YOU HAD AN ENDOSCOPIC PROCEDURE TODAY AT [facility]: Refer to the procedure report that was given to you for any specific questions about what was found during the examination.  If the procedure report does not answer your questions, please call your gastroenterologist to clarify.  If you requested that your care partner not be given the details of your procedure findings, then the procedure report has been included in a sealed envelope for you to review at your convenience later.  YOU SHOULD EXPECT: Some feelings of bloating in the abdomen. Passage of more gas than usual.  Walking can help get rid of the air that was put into your GI tract during the procedure and reduce the bloating. If you had a lower endoscopy (such as a colonoscopy or flexible sigmoidoscopy) you may notice spotting of blood in your stool or on the toilet paper. If you underwent a bowel prep for your procedure, then you may not have a normal bowel movement for a few days.  DIET: Your first meal following the procedure should be a light meal and then it is ok to progress to your normal diet.  A half-sandwich or bowl of soup is an example of a good first meal.  Heavy or fried foods are harder to digest and may make you feel nauseous or bloated.  Likewise meals heavy in dairy and vegetables can cause extra gas to form and this can also increase the bloating.  Drink plenty of fluids but you should avoid alcoholic beverages for 24 hours.  ACTIVITY: Your care partner should take you home directly after the procedure.  You should plan to take it easy, moving slowly for the rest of the day.  You can resume normal activity the day after the procedure however you should NOT DRIVE or use heavy machinery for 24 hours (because of the sedation medicines used during the test).    SYMPTOMS TO REPORT IMMEDIATELY: A gastroenterologist can be reached at  any hour.  During normal business hours, 8:30 AM to 5:00 PM Monday through Friday, call [phone].  After hours and on weekends, please call the GI answering service at [phone] who will take a message and have the physician on call contact you.   Following upper endoscopy (EGD)  Vomiting of blood or coffee ground material  New chest pain or pain under the shoulder blades  Painful or persistently difficult swallowing  New shortness of breath  Fever of 100F or higher  Black, tarry-looking stools  FOLLOW UP: If any biopsies were taken you will be contacted by phone or by letter within the next 1-3 weeks.  Call your gastroenterologist if you have not heard about the biopsies in 3 weeks.  Our staff will call the home number listed on your records the next business day following your procedure to check on you and address any questions or concerns that you may have at that time regarding the information given to you following your procedure. This is a courtesy call and so if there is no answer at the home number and we have not heard from you through the emergency physician on call, we will assume that you have returned to your regular daily activities without incident.  SIGNATURES/CONFIDENTIALITY: You and/or your care partner have signed paperwork which will be entered into your electronic medical record.  These signatures attest to the fact that that the information above on your After Visit  Summary has been reviewed and is understood.  Full responsibility of the confidentiality of this discharge information lies with you and/or your care-partner. 

## 2013-04-23 ENCOUNTER — Telehealth: Payer: Self-pay | Admitting: *Deleted

## 2013-04-23 NOTE — Telephone Encounter (Signed)
Called pt back and advised to continue the nexium at current dose or she can try prilosec 20 mg daily over the counter, pt states understanding-adm

## 2013-04-23 NOTE — Telephone Encounter (Signed)
  Follow up Call-  Call back number 04/20/2013  Post procedure Call Back phone  # [phone]  Permission to leave phone message Yes     Patient questions:  Do you have a fever, pain , or abdominal swelling? no Pain Score  0 *  Have you tolerated food without any problems? yes  Have you been able to return to your normal activities? yes  Do you have any questions about your discharge instructions: Diet   no Medications  Yes  Follow up visit  no  Do you have questions or concerns about your Care? no  Actions: * If pain score is 4 or above: No action needed, pain <4.  Pt had EGD procedure on 04/20/13. Pt wants to know if you want her to continue with the 40 mg of nexium or if you want her to decrease it and only take 20 mg, pt states you said something about getting it over the counter and wanted to verify amount. Recommendations say continue nexium, please advise-adm

## 2013-04-23 NOTE — Telephone Encounter (Signed)
She continues Nexium or she can try over-the-counter Prilosec 20 mg daily

## 2013-09-04 ENCOUNTER — Ambulatory Visit (INDEPENDENT_AMBULATORY_CARE_PROVIDER_SITE_OTHER): Payer: Medicare Other | Admitting: Family Medicine

## 2013-09-04 ENCOUNTER — Ambulatory Visit (INDEPENDENT_AMBULATORY_CARE_PROVIDER_SITE_OTHER)
Admission: RE | Admit: 2013-09-04 | Discharge: 2013-09-04 | Disposition: A | Discharge status: Home / Self Care | Payer: Medicare Other | Source: Ambulatory Visit | Attending: Family Medicine | Admitting: Family Medicine

## 2013-09-04 ENCOUNTER — Encounter: Payer: Self-pay | Admitting: Family Medicine

## 2013-09-04 VITALS — BP 126/78 | HR 89 | Wt 127.0 lb

## 2013-09-04 DIAGNOSIS — M79674 Pain in right toe(s): Secondary | ICD-10-CM

## 2013-09-04 DIAGNOSIS — M79609 Pain in unspecified limb: Secondary | ICD-10-CM

## 2013-09-04 MED ORDER — ESOMEPRAZOLE MAGNESIUM 40 MG PO CPDR: 40.0000 mg | DELAYED_RELEASE_CAPSULE | Freq: Every morning | ORAL | Status: DC

## 2013-09-04 NOTE — Progress Notes (Signed)
Pre visit review using our clinic review tool, if applicable. No additional management support is needed unless otherwise documented below in the visit note. 

## 2013-09-04 NOTE — Progress Notes (Signed)
   Subjective:    Patient ID: Heidi Shah, female    DOB: 13-Feb-1946, 68 y.o.   MRN: [id number]  Toe Pain  Pertinent negatives include no numbness.   Patient seen with right fourth toe pain. Last September she was playing around with grandson and he fell with his knee full weight on that toe. She denied any bruising or bleeding or skin breakthrough at that time. She was able to ambulate afterwards. Since that time though she's had some intermittent swelling and occasional redness involving just the right fourth toe. She has never noted any warmth. No fevers or chills. No history of gout.  No other arthralgias.  She also has some nonspecific burning on the bottoms of both feet occasionally and she typically works 12 hour shifts and noticed after prolonged standing that the symptoms are worse. No lower extremity weakness.  Past Medical History  Diagnosis Date  . Chicken pox   . Depression   . Diverticulitis   . Hypertension   . GERD (gastroesophageal reflux disease)   . Cataract     surgery   Past Surgical History  Procedure Laterality Date  . Cataract surgery Bilateral 1996  . Hernia repair  1990  . Neck surgery      as a child    reports that she has never smoked. She has never used smokeless tobacco. She reports that she drinks alcohol. She reports that she does not use illicit drugs. family history includes COPD in her mother; Cancer in her father; Crohn's disease in her mother; Heart disease (age of onset: 65) in her mother; Heart disease (age of onset: 37) in her father. There is no history of Colon cancer, Esophageal cancer, or Stomach cancer. Allergies  Allergen Reactions  . Penicillins       Review of Systems  Constitutional: Negative for fever and chills.  Skin: Negative for rash.  Neurological: Negative for weakness and numbness.       Objective:   Physical Exam  Constitutional: She appears well-developed and well-nourished.  Cardiovascular: Normal rate  and regular rhythm.   Musculoskeletal:  Right fourth toe reveals some mild erythema around the DIP joint. No warmth to palpation. Nontender. No evidence for ingrown toenail. No paronychia. Feet reveal 2+ dorsalis pedis pulses bilaterally. No edema other than slight edema right fourth toe          Assessment & Plan:  Intermittent right fourth toe pain and swelling. Remote history of injury as above. She does not describe any injury which would place her at risk for osteomyelitis -such as nail injury or skin tear. Check plain x-rays to start.

## 2013-09-05 ENCOUNTER — Other Ambulatory Visit: Payer: Self-pay

## 2013-09-05 MED ORDER — CEPHALEXIN 500 MG PO CAPS: 500.0000 mg | ORAL_CAPSULE | Freq: Three times a day (TID) | ORAL | Status: DC

## 2013-09-18 ENCOUNTER — Telehealth: Payer: Self-pay | Admitting: Family Medicine

## 2013-09-18 NOTE — Telephone Encounter (Signed)
FYI Pt was seen on 09-04-13 for toes and is calling to report abx cleared up infection.

## 2013-11-27 ENCOUNTER — Other Ambulatory Visit: Payer: Self-pay

## 2013-11-27 DIAGNOSIS — Z1231 Encounter for screening mammogram for malignant neoplasm of breast: Secondary | ICD-10-CM

## 2013-11-30 ENCOUNTER — Other Ambulatory Visit: Payer: Self-pay | Admitting: Family Medicine

## 2013-12-06 ENCOUNTER — Telehealth: Payer: Self-pay | Admitting: Family Medicine

## 2013-12-06 NOTE — Telephone Encounter (Signed)
Pt states [facility] is requesting a prior auth on her esomeprazole (NEXIUM) 40 MG capsule. This is a refill s Pharm states pt has met maximum dosage and will fax info.for prior auth.

## 2013-12-06 NOTE — Telephone Encounter (Signed)
PA has been received and submitted

## 2013-12-13 ENCOUNTER — Ambulatory Visit: Payer: Medicare Other

## 2013-12-19 ENCOUNTER — Ambulatory Visit
Admission: RE | Admit: 2013-12-19 | Discharge: 2013-12-19 | Disposition: A | Discharge status: Home / Self Care | Payer: Medicare Other | Source: Ambulatory Visit

## 2013-12-19 DIAGNOSIS — Z1231 Encounter for screening mammogram for malignant neoplasm of breast: Secondary | ICD-10-CM

## 2013-12-25 ENCOUNTER — Encounter: Payer: Self-pay | Admitting: Physician Assistant

## 2013-12-25 ENCOUNTER — Ambulatory Visit (INDEPENDENT_AMBULATORY_CARE_PROVIDER_SITE_OTHER): Payer: Medicare Other | Admitting: Physician Assistant

## 2013-12-25 VITALS — BP 120/86 | HR 66 | Temp 98.3°F | Resp 18 | Wt 129.0 lb

## 2013-12-25 DIAGNOSIS — N390 Urinary tract infection, site not specified: Secondary | ICD-10-CM

## 2013-12-25 DIAGNOSIS — R300 Dysuria: Secondary | ICD-10-CM

## 2013-12-25 LAB — POCT URINALYSIS DIPSTICK
BILIRUBIN UA: NEGATIVE
Glucose, UA: NEGATIVE
Ketones, UA: NEGATIVE
Nitrite, UA: NEGATIVE
PH UA: 6
Spec Grav, UA: 1.01
Urobilinogen, UA: 1

## 2013-12-25 MED ORDER — CEPHALEXIN 500 MG PO CAPS: 500.0000 mg | ORAL_CAPSULE | Freq: Two times a day (BID) | ORAL | Status: DC

## 2013-12-25 NOTE — Progress Notes (Signed)
Subjective:    Patient ID: Heidi Shah, female    DOB: 1946-01-23, 68 y.o.   MRN: [id number]  Dysuria  This is a new problem. The current episode started in the past 7 days (7 days). The problem occurs every urination. The problem has been unchanged. The quality of the pain is described as burning. The pain is at a severity of 8/10. There has been no fever. There is no history of pyelonephritis. Associated symptoms include frequency, hematuria and urgency. Pertinent negatives include no chills, discharge, flank pain, hesitancy, nausea, possible pregnancy, sweats or vomiting. She has tried increased fluids (cranberry juice) for the symptoms. The treatment provided no relief. There is no history of catheterization, kidney stones, recurrent UTIs, a single kidney, urinary stasis or a urological procedure.      Review of Systems  Constitutional: Negative for fever and chills.  Respiratory: Negative for shortness of breath.   Cardiovascular: Negative for chest pain.  Gastrointestinal: Negative for nausea, vomiting and diarrhea.  Genitourinary: Positive for dysuria, urgency, frequency and hematuria. Negative for hesitancy and flank pain.  Neurological: Negative for syncope and headaches.  All other systems reviewed and are negative.    Past Medical History  Diagnosis Date  . Chicken pox   . Depression   . Diverticulitis   . Hypertension   . GERD (gastroesophageal reflux disease)   . Cataract     surgery    History   Social History  . Marital Status: Legally Separated    Spouse Name: N/A    Number of Children: 2  . Years of Education: N/A   Occupational History  . retired Pharmacist, hospital    Social History Main Topics  . Smoking status: Never Smoker   . Smokeless tobacco: Never Used  . Alcohol Use: Yes     Comment: one or two glasses of wine a couple times a week  . Drug Use: No  . Sexual Activity: Not on file   Other Topics Concern  . Not on file   Social History  Narrative  . No narrative on file    Past Surgical History  Procedure Laterality Date  . Cataract surgery Bilateral 1996  . Hernia repair  1990  . Neck surgery      as a child    Family History  Problem Relation Age of Onset  . Heart disease Mother 32  . COPD Mother   . Crohn's disease Mother   . Heart disease Father 29  . Cancer Father     kidney  . Colon cancer Neg Hx   . Esophageal cancer Neg Hx   . Stomach cancer Neg Hx     Allergies  Allergen Reactions  . Penicillins     Current Outpatient Prescriptions on File Prior to Visit  Medication Sig Dispense Refill  . ALPRAZolam (XANAX) 0.25 MG tablet 2 (two) times daily as needed. Take 1 tablet by mouth twice daily as needed      . aspirin 81 MG tablet Take 81 mg by mouth daily.      . cephALEXin (KEFLEX) 500 MG capsule Take 1 capsule (500 mg total) by mouth 3 (three) times daily.  30 capsule  0  . cetirizine (ZYRTEC) 10 MG tablet Take 10 mg by mouth daily.      . Cholecalciferol (D3 ADULT PO) Take by mouth. daily      . esomeprazole (NEXIUM) 40 MG capsule Take 1 capsule (40 mg total) by mouth every morning. 30 minutes  before breakfast  90 capsule  3  . lisinopril-hydrochlorothiazide (PRINZIDE,ZESTORETIC) 10-12.5 MG per tablet TAKE 1 TABLET BY MOUTH DAILY.  90 tablet  1  . Multiple Vitamin (MULTIVITAMIN) tablet Take 1 tablet by mouth daily.      Marland Kitchen saccharomyces boulardii (FLORASTOR) 250 MG capsule Take 250 mg by mouth daily.      . vitamin C (ASCORBIC ACID) 500 MG tablet Take 500 mg by mouth daily.       No current facility-administered medications on file prior to visit.    EXAM: BP 120/86  Pulse 66  Temp(Src) 98.3 F (36.8 C) (Oral)  Resp 18  Wt 129 lb (58.514 kg)     Objective:   Physical Exam  Nursing note and vitals reviewed. Constitutional: She is oriented to person, place, and time. She appears well-developed and well-nourished. No distress.  HENT:  Head: Normocephalic and atraumatic.  Eyes:  Conjunctivae and EOM are normal. Pupils are equal, round, and reactive to light.  Cardiovascular: Normal rate, regular rhythm and intact distal pulses.   Pulmonary/Chest: Effort normal and breath sounds normal. No respiratory distress. She has no wheezes. She has no rales. She exhibits no tenderness.  No cva ttp.  Neurological: She is alert and oriented to person, place, and time.  Skin: Skin is warm and dry. No rash noted. She is not diaphoretic. No erythema. No pallor.  Psychiatric: She has a normal mood and affect. Her behavior is normal. Judgment and thought content normal.    Lab Results  Component Value Date   WBC 4.6 12/20/2012   HGB 13.7 12/20/2012   HCT 40.4 12/20/2012   PLT 288.0 12/20/2012   GLUCOSE 86 12/20/2012   CHOL 263* 12/20/2012   TRIG 47.0 12/20/2012   HDL 87.90 12/20/2012   LDLDIRECT 162.6 12/20/2012   ALT 16 12/20/2012   AST 24 12/20/2012   NA 139 12/20/2012   K 3.7 12/20/2012   CL 104 12/20/2012   CREATININE 0.6 12/20/2012   BUN 16 12/20/2012   CO2 30 12/20/2012   TSH 1.09 12/20/2012         Assessment & Plan:  Heidi Shah was seen today for dysuria.  Diagnoses and associated orders for this visit:  Dysuria Comments: UA shows blood and leuks, will obtain culture. - POCT urinalysis dipstick; Standing - POCT urinalysis dipstick - Culture, Urine - cephALEXin (KEFLEX) 500 MG capsule; Take 1 capsule (500 mg total) by mouth 2 (two) times daily.  Urinary tract infection, site not specified Comments: Treat with Keflex pending culture. Push fluid hydration with water. Watchful waiting. - cephALEXin (KEFLEX) 500 MG capsule; Take 1 capsule (500 mg total) by mouth 2 (two) times daily.    Return precautions provided, and patient handout on UTI.  Plan to follow up as needed, or for worsening or persistent symptoms despite treatment.  Patient Instructions  Keflex twice daily for 7 days to treat UTI.   We will call with your urine culture results when they are  available.  Push fluid hydration with water.  If emergency symptoms discussed during visit developed, seek medical attention immediately.  Followup as needed, or for worsening or persistent symptoms despite treatment.

## 2013-12-25 NOTE — Patient Instructions (Addendum)
Keflex twice daily for 7 days to treat UTI.   We will call with your urine culture results when they are available.  Push fluid hydration with water.  If emergency symptoms discussed during visit developed, seek medical attention immediately.  Followup as needed, or for worsening or persistent symptoms despite treatment.    Urinary Tract Infection A urinary tract infection (UTI) can occur any place along the urinary tract. The tract includes the kidneys, ureters, bladder, and urethra. A type of germ called bacteria often causes a UTI. UTIs are often helped with antibiotic medicine.  HOME CARE   If given, take antibiotics as told by your doctor. Finish them even if you start to feel better.  Drink enough fluids to keep your pee (urine) clear or pale yellow.  Avoid tea, drinks with caffeine, and bubbly (carbonated) drinks.  Pee often. Avoid holding your pee in for a long time.  Pee before and after having sex (intercourse).  Wipe from front to back after you poop (bowel movement) if you are a woman. Use each tissue only once. GET HELP RIGHT AWAY IF:   You have back pain.  You have lower belly (abdominal) pain.  You have chills.  You feel sick to your stomach (nauseous).  You throw up (vomit).  Your burning or discomfort with peeing does not go away.  You have a fever.  Your symptoms are not better in 3 days. MAKE SURE YOU:   Understand these instructions.  Will watch your condition.  Will get help right away if you are not doing well or get worse. Document Released: 09/01/2007 Document Revised: 12/08/2011 Document Reviewed: 10/14/2011 [facility] Patient Information 2015 [place], [place]. This information is not intended to replace advice given to you by your health care provider. Make sure you discuss any questions you have with your health care provider.

## 2013-12-25 NOTE — Progress Notes (Signed)
Pre visit review using our clinic review tool, if applicable. No additional management support is needed unless otherwise documented below in the visit note. 

## 2013-12-26 ENCOUNTER — Other Ambulatory Visit (INDEPENDENT_AMBULATORY_CARE_PROVIDER_SITE_OTHER): Payer: Medicare Other

## 2013-12-26 DIAGNOSIS — Z23 Encounter for immunization: Secondary | ICD-10-CM

## 2013-12-26 DIAGNOSIS — Z0000 Encounter for general adult medical examination without abnormal findings: Secondary | ICD-10-CM

## 2013-12-26 DIAGNOSIS — I10 Essential (primary) hypertension: Secondary | ICD-10-CM

## 2013-12-26 LAB — POCT URINALYSIS DIPSTICK
Bilirubin, UA: NEGATIVE
Glucose, UA: NEGATIVE
KETONES UA: NEGATIVE
NITRITE UA: NEGATIVE
PH UA: 7
Protein, UA: NEGATIVE
SPEC GRAV UA: 1.01
Urobilinogen, UA: 0.2

## 2013-12-26 LAB — BASIC METABOLIC PANEL
BUN: 14 mg/dL (ref 6–23)
CHLORIDE: 100 meq/L (ref 96–112)
CO2: 28 mEq/L (ref 19–32)
Calcium: 9.7 mg/dL (ref 8.4–10.5)
Creatinine, Ser: 0.6 mg/dL (ref 0.4–1.2)
GFR: 104.34 mL/min (ref >60.00)
Glucose, Bld: 89 mg/dL (ref 70–99)
Potassium: 4.7 mEq/L (ref 3.5–5.1)
SODIUM: 137 meq/L (ref 135–145)

## 2013-12-26 LAB — CBC WITH DIFFERENTIAL/PLATELET
Basophils Absolute: 0.1 10*3/uL (ref 0.0–0.1)
Basophils Relative: 0.9 % (ref 0.0–3.0)
Eosinophils Absolute: 0.3 10*3/uL (ref 0.0–0.7)
Eosinophils Relative: 4.3 % (ref 0.0–5.0)
HCT: 40.4 % (ref 36.0–46.0)
Hemoglobin: 13.7 g/dL (ref 12.0–15.0)
LYMPHS ABS: 1.8 10*3/uL (ref 0.7–4.0)
Lymphocytes Relative: 28.6 % (ref 12.0–46.0)
MCHC: 33.9 g/dL (ref 30.0–36.0)
MCV: 92 fl (ref 78.0–100.0)
MONO ABS: 0.6 10*3/uL (ref 0.1–1.0)
MONOS PCT: 9.2 % (ref 3.0–12.0)
NEUTROS PCT: 57 % (ref 43.0–77.0)
Neutro Abs: 3.7 10*3/uL (ref 1.4–7.7)
PLATELETS: 314 10*3/uL (ref 150.0–400.0)
RBC: 4.39 Mil/uL (ref 3.87–5.11)
RDW: 12.9 % (ref 11.5–15.5)
WBC: 6.5 10*3/uL (ref 4.0–10.5)

## 2013-12-26 LAB — HEPATIC FUNCTION PANEL
ALT: 16 U/L (ref 0–35)
AST: 26 U/L (ref 0–37)
Albumin: 4.2 g/dL (ref 3.5–5.2)
Alkaline Phosphatase: 49 U/L (ref 39–117)
BILIRUBIN DIRECT: 0 mg/dL (ref 0.0–0.3)
BILIRUBIN TOTAL: 0.5 mg/dL (ref 0.2–1.2)
Total Protein: 7.3 g/dL (ref 6.0–8.3)

## 2013-12-26 LAB — LIPID PANEL
Cholesterol: 242 mg/dL — ABNORMAL HIGH (ref 0–200)
HDL: 73.7 mg/dL (ref >39.00)
LDL Cholesterol: 150 mg/dL — ABNORMAL HIGH (ref 0–99)
NONHDL: 168.3
Total CHOL/HDL Ratio: 3
Triglycerides: 93 mg/dL (ref 0.0–149.0)
VLDL: 18.6 mg/dL (ref 0.0–40.0)

## 2013-12-26 LAB — TSH: TSH: 0.91 u[IU]/mL (ref 0.35–4.50)

## 2013-12-26 NOTE — Addendum Note (Signed)
Addended by: Colleen Can on: 12/26/2013 09:44 AM   Modules accepted: Orders

## 2013-12-28 LAB — URINE CULTURE: Colony Count: >=100000

## 2014-01-01 ENCOUNTER — Ambulatory Visit (INDEPENDENT_AMBULATORY_CARE_PROVIDER_SITE_OTHER): Payer: Medicare Other | Admitting: Family Medicine

## 2014-01-01 ENCOUNTER — Encounter: Payer: Self-pay | Admitting: Family Medicine

## 2014-01-01 VITALS — BP 120/80 | HR 70 | Temp 98.0°F | Ht 59.0 in | Wt 126.0 lb

## 2014-01-01 DIAGNOSIS — Z23 Encounter for immunization: Secondary | ICD-10-CM

## 2014-01-01 DIAGNOSIS — Z0000 Encounter for general adult medical examination without abnormal findings: Secondary | ICD-10-CM

## 2014-01-01 DIAGNOSIS — R319 Hematuria, unspecified: Secondary | ICD-10-CM

## 2014-01-01 LAB — POCT URINALYSIS DIPSTICK
Bilirubin, UA: NEGATIVE
GLUCOSE UA: NEGATIVE
KETONES UA: NEGATIVE
Nitrite, UA: NEGATIVE
Protein, UA: NEGATIVE
RBC UA: NEGATIVE
Urobilinogen, UA: 0.2
pH, UA: 6

## 2014-01-01 MED ORDER — LISINOPRIL 10 MG PO TABS: 10.0000 mg | ORAL_TABLET | Freq: Every day | ORAL | Status: DC

## 2014-01-01 NOTE — Addendum Note (Signed)
Addended by: Marcina Millard on: 01/01/2014 09:44 AM   Modules accepted: Orders

## 2014-01-01 NOTE — Addendum Note (Signed)
Addended by: Marcina Millard on: 01/01/2014 09:38 AM   Modules accepted: Orders

## 2014-01-01 NOTE — Patient Instructions (Signed)
Will change lisinopril HCTZ to plain lisinopril 10 mg once daily Office followup in one month to reassess blood pressure

## 2014-01-01 NOTE — Progress Notes (Signed)
Pre visit review using our clinic review tool, if applicable. No additional management support is needed unless otherwise documented below in the visit note. 

## 2014-01-01 NOTE — Progress Notes (Signed)
Subjective:    Patient ID: Heidi Shah, female    DOB: Aug 12, 1945, 68 y.o.   MRN: [id number]  HPI Patient seen for complete physical. She has hypertension treated with lisinopril HCTZ. Blood pressures been very well controlled. She has history of reflux and had upper endoscopy last year secondary to dysphagia. She's not any recent progressive dysphagia symptoms. She had 23 valent pneumococcal vaccine last year.  flu vaccine already. Colonoscopy 2007. She sees gynecologist had recent mammogram. Walks for exercise. Nonsmoker.  She had recent urinary tract infection which was treated and is asymptomatic at this time.  Past Medical History  Diagnosis Date  . Chicken pox   . Depression   . Diverticulitis   . Hypertension   . GERD (gastroesophageal reflux disease)   . Cataract     surgery   Past Surgical History  Procedure Laterality Date  . Cataract surgery Bilateral 1996  . Hernia repair  1990  . Neck surgery      as a child    reports that she has never smoked. She has never used smokeless tobacco. She reports that she drinks alcohol. She reports that she does not use illicit drugs. family history includes COPD in her mother; Cancer in her father; Crohn's disease in her mother; Heart disease (age of onset: 61) in her mother; Heart disease (age of onset: 32) in her father. There is no history of Colon cancer, Esophageal cancer, or Stomach cancer. Allergies  Allergen Reactions  . Penicillins       Review of Systems  Constitutional: Negative for fever, activity change, appetite change, fatigue and unexpected weight change.  HENT: Negative for ear pain, hearing loss, sore throat and trouble swallowing.   Eyes: Negative for visual disturbance.  Respiratory: Negative for cough and shortness of breath.   Cardiovascular: Negative for chest pain and palpitations.  Gastrointestinal: Negative for abdominal pain, diarrhea, constipation and blood in stool.  Genitourinary: Negative  for dysuria and hematuria.  Musculoskeletal: Negative for arthralgias, back pain and myalgias.  Skin: Negative for rash.  Neurological: Negative for dizziness, syncope and headaches.  Hematological: Negative for adenopathy.  Psychiatric/Behavioral: Negative for confusion and dysphoric mood.       Objective:   Physical Exam  Constitutional: She is oriented to person, place, and time. She appears well-developed and well-nourished.  HENT:  Head: Normocephalic and atraumatic.  Eyes: EOM are normal. Pupils are equal, round, and reactive to light.  Neck: Normal range of motion. Neck supple. No thyromegaly present.  Cardiovascular: Normal rate, regular rhythm and normal heart sounds.   No murmur heard. Pulmonary/Chest: Breath sounds normal. No respiratory distress. She has no wheezes. She has no rales.  Abdominal: Soft. Bowel sounds are normal. She exhibits no distension and no mass. There is no tenderness. There is no rebound and no guarding.  Genitourinary:  Per gyn  Musculoskeletal: Normal range of motion. She exhibits no edema.  Lymphadenopathy:    She has no cervical adenopathy.  Neurological: She is alert and oriented to person, place, and time. She displays normal reflexes. No cranial nerve deficit.  Skin: No rash noted.  Psychiatric: She has a normal mood and affect. Her behavior is normal. Judgment and thought content normal.          Assessment & Plan:  Complete physical. Prevnar 13 given. Flu vaccine already given. Hemoccult cards given. Try reduce the lisinopril HCTZ to plain lisinopril and reassess blood pressure one month. We'll consider further titrating down that point  if to goal.  Repeat urinalysis. If positive urine blood check micro.

## 2014-01-22 ENCOUNTER — Ambulatory Visit: Payer: Medicare Other | Admitting: Family Medicine

## 2014-02-12 ENCOUNTER — Ambulatory Visit (INDEPENDENT_AMBULATORY_CARE_PROVIDER_SITE_OTHER): Payer: Medicare Other | Admitting: Family Medicine

## 2014-02-12 ENCOUNTER — Encounter: Payer: Self-pay | Admitting: Family Medicine

## 2014-02-12 VITALS — BP 140/90 | HR 86 | Temp 98.3°F | Wt 126.0 lb

## 2014-02-12 DIAGNOSIS — I10 Essential (primary) hypertension: Secondary | ICD-10-CM

## 2014-02-12 NOTE — Progress Notes (Signed)
   Subjective:    Patient ID: Heidi Shah, female    DOB: 1946-02-03, 68 y.o.   MRN: [id number]  HPI Follow-up hypertension. Her blood pressures been very well controlled. We recently reduced her from lisinopril HCTZ to plain lisinopril. She has felt better overall with less urine frequency. She is not monitor blood pressure. Still walking about 3 miles per day. No dizziness. No headaches. No chest pains. She did not take her blood pressure medication this morning.  Past Medical History  Diagnosis Date  . Chicken pox   . Depression   . Diverticulitis   . Hypertension   . GERD (gastroesophageal reflux disease)   . Cataract     surgery   Past Surgical History  Procedure Laterality Date  . Cataract surgery Bilateral 1996  . Hernia repair  1990  . Neck surgery      as a child    reports that she has never smoked. She has never used smokeless tobacco. She reports that she drinks alcohol. She reports that she does not use illicit drugs. family history includes COPD in her mother; Cancer in her father; Crohn's disease in her mother; Heart disease (age of onset: 81) in her mother; Heart disease (age of onset: 65) in her father. There is no history of Colon cancer, Esophageal cancer, or Stomach cancer. Allergies  Allergen Reactions  . Penicillins       Review of Systems  Constitutional: Negative for fatigue.  Eyes: Negative for visual disturbance.  Respiratory: Negative for cough, chest tightness, shortness of breath and wheezing.   Cardiovascular: Negative for chest pain, palpitations and leg swelling.  Neurological: Negative for dizziness, seizures, syncope, weakness, light-headedness and headaches.       Objective:   Physical Exam  Constitutional: She appears well-developed and well-nourished.  Cardiovascular: Normal rate and regular rhythm.   Pulmonary/Chest: Effort normal and breath sounds normal. No respiratory distress. She has no wheezes. She has no rales.    Musculoskeletal: She exhibits no edema.          Assessment & Plan:  Hypertension. Diastolic is marginal today but she did not take her medication. We've recommended close home monitoring and be in touch if consistently greater than 150/90 which is her goal for her age with lack of co-morbidities such as diabetes or chronic kidney disease

## 2014-02-12 NOTE — Progress Notes (Signed)
Pre visit review using our clinic review tool, if applicable. No additional management support is needed unless otherwise documented below in the visit note. 

## 2014-02-12 NOTE — Patient Instructions (Signed)
Monitor blood pressure and be in touch if consistently > 150/90 

## 2014-02-14 ENCOUNTER — Telehealth: Payer: Self-pay | Admitting: Family Medicine

## 2014-02-14 NOTE — Telephone Encounter (Signed)
emmi mailed  °

## 2014-04-18 ENCOUNTER — Other Ambulatory Visit: Payer: Self-pay | Admitting: Family Medicine

## 2014-04-29 ENCOUNTER — Ambulatory Visit (INDEPENDENT_AMBULATORY_CARE_PROVIDER_SITE_OTHER): Payer: Medicare HMO | Admitting: Family Medicine

## 2014-04-29 ENCOUNTER — Encounter: Payer: Self-pay | Admitting: Family Medicine

## 2014-04-29 VITALS — BP 128/80 | HR 80 | Temp 98.5°F | Wt 120.0 lb

## 2014-04-29 DIAGNOSIS — M5416 Radiculopathy, lumbar region: Secondary | ICD-10-CM

## 2014-04-29 NOTE — Patient Instructions (Signed)

## 2014-04-29 NOTE — Progress Notes (Signed)
Pre visit review using our clinic review tool, if applicable. No additional management support is needed unless otherwise documented below in the visit note. 

## 2014-04-29 NOTE — Progress Notes (Signed)
   Subjective:    Patient ID: Heidi Shah, female    DOB: [birth year], 69 y.o.   MRN: [id number]  HPI Acute visit. Patient seen with low back pain. Onset last week after picking up 40 pound bag of salt. Patient relates left lower lumbar back pain and she has some radiation occasion down to the knee. She has also noticed a tingling and slight numbness on the lateral aspect of the leg. There's been no weakness. No loss of bladder bowel control. No fevers or chills. No appetite or weight changes. Pain is generally about 6 out of 10 and improved with Advil. Worse with back flexion and improved with back extension. No prior history of major back difficulties.  Past Medical History  Diagnosis Date  . Chicken pox   . Depression   . Diverticulitis   . Hypertension   . GERD (gastroesophageal reflux disease)   . Cataract     surgery   Past Surgical History  Procedure Laterality Date  . Cataract surgery Bilateral 1996  . Hernia repair  1990  . Neck surgery      as a child    reports that she has never smoked. She has never used smokeless tobacco. She reports that she drinks alcohol. She reports that she does not use illicit drugs. family history includes COPD in her mother; Cancer in her father; Crohn's disease in her mother; Heart disease (age of onset: 4) in her mother; Heart disease (age of onset: 64) in her father. There is no history of Colon cancer, Esophageal cancer, or Stomach cancer. Allergies  Allergen Reactions  . Penicillins       Review of Systems  Constitutional: Negative for fever, chills, appetite change and unexpected weight change.  Gastrointestinal: Negative for abdominal pain.  Musculoskeletal: Positive for back pain.  Neurological: Positive for numbness. Negative for weakness.       Objective:   Physical Exam  Constitutional: She appears well-developed and well-nourished.  Cardiovascular: Normal rate and regular rhythm.   Pulmonary/Chest: Effort normal  and breath sounds normal. No respiratory distress. She has no wheezes. She has no rales.  Musculoskeletal: She exhibits no edema.  Straight leg raise are negative bilaterally. Left hip full range of motion  Neurological:  She has generally blunted reflexes throughout knee and ankle bilaterally only trace. Full-strength lower extremities with plantar flexion dorsiflexion. Subjective minimal impairment sensation left lateral foot fourth and fifth toes          Assessment & Plan:  Left lumbar radiculopathy. Nonfocal exam neurologically. Her pain is fairly well-controlled this point on Advil. We've recommended observation. Avoid heavy lifting or frequent bending at the waist. Reviewed simple stretches. Continue Advil. Touch base and 4-6 weeks if not resolving

## 2014-07-26 NOTE — Telephone Encounter (Signed)
error:[id number] ° °

## 2014-08-12 ENCOUNTER — Encounter: Payer: Self-pay | Admitting: Family Medicine

## 2014-08-12 ENCOUNTER — Ambulatory Visit (INDEPENDENT_AMBULATORY_CARE_PROVIDER_SITE_OTHER): Payer: Medicare PPO | Admitting: Family Medicine

## 2014-08-12 VITALS — BP 120/76 | HR 102 | Temp 98.9°F | Ht 59.0 in | Wt 127.3 lb

## 2014-08-12 DIAGNOSIS — I10 Essential (primary) hypertension: Secondary | ICD-10-CM

## 2014-08-12 MED ORDER — LISINOPRIL 10 MG PO TABS: ORAL_TABLET | ORAL | Status: DC

## 2014-08-12 NOTE — Progress Notes (Signed)
   Subjective:    Patient ID: Heidi Shah, female    DOB: 11-30-1945, 69 y.o.   MRN: [id number]  HPI Patient seen for six-month follow-up. She has not been walking much recently. She remains on lisinopril 10 mg daily. Does not monitor blood pressures regular. No headaches. No dizziness. She developed cough about 3 days ago. Dry cough without fever. Minimal postnasal drainage. Cough relieved with over-the-counter medications.  Past Medical History  Diagnosis Date  . Chicken pox   . Depression   . Diverticulitis   . Hypertension   . GERD (gastroesophageal reflux disease)   . Cataract     surgery   Past Surgical History  Procedure Laterality Date  . Cataract surgery Bilateral 1996  . Hernia repair  1990  . Neck surgery      as a child    reports that she has never smoked. She has never used smokeless tobacco. She reports that she drinks alcohol. She reports that she does not use illicit drugs. family history includes COPD in her mother; Cancer in her father; Crohn's disease in her mother; Heart disease (age of onset: 3) in her mother; Heart disease (age of onset: 37) in her father. There is no history of Colon cancer, Esophageal cancer, or Stomach cancer. Allergies  Allergen Reactions  . Penicillins       Review of Systems  Constitutional: Negative for fatigue.  HENT: Positive for congestion.   Eyes: Negative for visual disturbance.  Respiratory: Positive for cough. Negative for chest tightness, shortness of breath and wheezing.   Cardiovascular: Negative for chest pain, palpitations and leg swelling.  Neurological: Negative for dizziness, seizures, syncope, weakness, light-headedness and headaches.       Objective:   Physical Exam  Constitutional: She appears well-developed and well-nourished.  HENT:  Right Ear: External ear normal.  Left Ear: External ear normal.  Mouth/Throat: Oropharynx is clear and moist.  Neck: Neck supple.  Cardiovascular: Normal rate  and regular rhythm.   Pulmonary/Chest: Effort normal and breath sounds normal. No respiratory distress. She has no wheezes. She has no rales.          Assessment & Plan:  Hypertension. Repeat reading by me left arm seated 148/88. Continue close home monitoring. Be in touch if consistently greater than 150/90. Start back regular walking.

## 2014-08-12 NOTE — Progress Notes (Signed)
Pre visit review using our clinic review tool, if applicable. No additional management support is needed unless otherwise documented below in the visit note. 

## 2014-08-12 NOTE — Patient Instructions (Signed)
Monitor blood pressure and be in touch if consistently > 150/90 Try to get back to more regular exercise.

## 2014-08-29 ENCOUNTER — Encounter: Payer: Self-pay | Admitting: Family Medicine

## 2014-08-29 ENCOUNTER — Ambulatory Visit (INDEPENDENT_AMBULATORY_CARE_PROVIDER_SITE_OTHER): Payer: Medicare PPO | Admitting: Family Medicine

## 2014-08-29 VITALS — BP 124/80 | HR 92 | Temp 100.1°F | Wt 125.0 lb

## 2014-08-29 DIAGNOSIS — R05 Cough: Secondary | ICD-10-CM

## 2014-08-29 DIAGNOSIS — R059 Cough, unspecified: Secondary | ICD-10-CM

## 2014-08-29 MED ORDER — AZITHROMYCIN 250 MG PO TABS: ORAL_TABLET | ORAL | Status: DC

## 2014-08-29 NOTE — Patient Instructions (Signed)
Follow up for any persistent fever or increased shortness of breath. 

## 2014-08-29 NOTE — Progress Notes (Signed)
Pre visit review using our clinic review tool, if applicable. No additional management support is needed unless otherwise documented below in the visit note. 

## 2014-08-29 NOTE — Progress Notes (Signed)
   Subjective:    Patient ID: Heidi Shah, female    DOB: April 20, 1945, 69 y.o.   MRN: [id number]  HPI Acute visit. Patient onset of cough about 3 weeks ago. Then earlier this week she developed low-grade fever around 100. She's had some chills and body aches. Some nasal congestion. Cough frequently productive of colored sputum. Increased fatigue. No dyspnea. No pleuritic pain. Denies any nausea, vomiting, or diarrhea. Nonsmoker.  Past Medical History  Diagnosis Date  . Chicken pox   . Depression   . Diverticulitis   . Hypertension   . GERD (gastroesophageal reflux disease)   . Cataract     surgery   Past Surgical History  Procedure Laterality Date  . Cataract surgery Bilateral 1996  . Hernia repair  1990  . Neck surgery      as a child    reports that she has never smoked. She has never used smokeless tobacco. She reports that she drinks alcohol. She reports that she does not use illicit drugs. family history includes COPD in her mother; Cancer in her father; Crohn's disease in her mother; Heart disease (age of onset: 72) in her mother; Heart disease (age of onset: 6) in her father. There is no history of Colon cancer, Esophageal cancer, or Stomach cancer. Allergies  Allergen Reactions  . Penicillins       Review of Systems  Constitutional: Positive for fever, chills and fatigue.  HENT: Positive for congestion.   Respiratory: Positive for cough.   Gastrointestinal: Negative for nausea, vomiting and diarrhea.       Objective:   Physical Exam  Constitutional: She appears well-developed and well-nourished.  HENT:  Right Ear: External ear normal.  Left Ear: External ear normal.  Mouth/Throat: Oropharynx is clear and moist.  Neck: Neck supple. No thyromegaly present.  Cardiovascular: Normal rate and regular rhythm.   Pulmonary/Chest: Effort normal and breath sounds normal. No respiratory distress. She has no wheezes.  She has a few scattered rhonchi in the bases  bilaterally  Lymphadenopathy:    She has no cervical adenopathy.          Assessment & Plan:  Cough for 3 weeks now with low-grade fever. Pulse oximetry stable and she is in no respiratory distress. No localizing asymmetric breath sounds but given new onset of fever cover with Zithromax. If not promptly improving over the next few days consider chest x-ray and further evaluation.

## 2014-08-30 ENCOUNTER — Telehealth: Payer: Self-pay | Admitting: Family Medicine

## 2014-08-30 ENCOUNTER — Encounter: Payer: Self-pay | Admitting: Family Medicine

## 2014-08-30 ENCOUNTER — Ambulatory Visit (INDEPENDENT_AMBULATORY_CARE_PROVIDER_SITE_OTHER): Payer: Medicare PPO | Admitting: Family Medicine

## 2014-08-30 VITALS — BP 130/80 | HR 94 | Temp 98.9°F | Wt 124.0 lb

## 2014-08-30 DIAGNOSIS — R059 Cough, unspecified: Secondary | ICD-10-CM

## 2014-08-30 DIAGNOSIS — R05 Cough: Secondary | ICD-10-CM

## 2014-08-30 MED ORDER — HYDROCODONE-HOMATROPINE 5-1.5 MG/5ML PO SYRP: 5.0000 mL | ORAL_SOLUTION | Freq: Four times a day (QID) | ORAL | Status: AC | PRN

## 2014-08-30 NOTE — Progress Notes (Signed)
   Subjective:    Patient ID: Heidi Shah, female    DOB: [birth year], 69 y.o.   MRN: [id number]  HPI Patient seen with worsening cough. She was seen just yesterday and started on Zithromax. She has not had any fever today and denies any shortness of breath. No nausea or vomiting. She has some right lower rib pain with extreme coughing. No hemoptysis. No pleuritic pain. Cough is intermittent. Not relieved with over-the-counter medication.  Past Medical History  Diagnosis Date  . Chicken pox   . Depression   . Diverticulitis   . Hypertension   . GERD (gastroesophageal reflux disease)   . Cataract     surgery   Past Surgical History  Procedure Laterality Date  . Cataract surgery Bilateral 1996  . Hernia repair  1990  . Neck surgery      as a child    reports that she has never smoked. She has never used smokeless tobacco. She reports that she drinks alcohol. She reports that she does not use illicit drugs. family history includes COPD in her mother; Cancer in her father; Crohn's disease in her mother; Heart disease (age of onset: 66) in her mother; Heart disease (age of onset: 47) in her father. There is no history of Colon cancer, Esophageal cancer, or Stomach cancer. Allergies  Allergen Reactions  . Penicillins       Review of Systems  Constitutional: Negative for fever and chills.  Respiratory: Positive for cough. Negative for shortness of breath and wheezing.   Neurological: Negative for syncope.       Objective:   Physical Exam  Constitutional: She appears well-developed and well-nourished. No distress.  HENT:  Mouth/Throat: Oropharynx is clear and moist.  Cardiovascular: Normal rate and regular rhythm.   Pulmonary/Chest: Effort normal and breath sounds normal. No respiratory distress. She has no wheezes. She has no rales.  Mild tenderness right lower rib cage          Assessment & Plan:  Cough. Nonfocal exam. No rales. Afebrile. No respiratory  distress. Suspect chest wall pain related to coughing. Finish out [employer]. Hycodan cough syrup 1 teaspoon daily at bedtime when necessary for cough. Follow-up for any fever or worsening symptoms

## 2014-08-30 NOTE — Telephone Encounter (Signed)
Appointment today with MD Elease Hashimoto

## 2014-08-30 NOTE — Progress Notes (Signed)
Pre visit review using our clinic review tool, if applicable. No additional management support is needed unless otherwise documented below in the visit note. 

## 2014-08-30 NOTE — Telephone Encounter (Signed)
Patient Name: Heidi Shah DOB: October 21, 1945 Initial Comment Caller states her cough is getting worse. She is having pains when she takes breath and coughs. Nurse Assessment Nurse: Vallery Sa, RN, Cathy Date/Time (Eastern Time): 08/30/2014 1:41:56 PM Confirm and document reason for call. If symptomatic, describe symptoms. ---Caller states she developed a productive cough 3 weeks ago. She was started on a Z-pak yesterday and told she may have Pneumonia. She was told to call if she became worse. She developed chest pain today (rated as a 6 on the 1 to 10 scale). No severe breathing difficulty. No known fever. Has the patient traveled out of the country within the last 30 days? ---No Does the patient require triage? ---Yes Related visit to physician within the last 2 weeks? ---Yes Does the PT have any chronic conditions? (i.e. diabetes, asthma, etc.) ---Yes List chronic conditions. ---High Blood Pressure Guidelines Guideline Title Affirmed Question Affirmed Notes Chest Pain Taking a deep breath makes pain worse Final Disposition User Go to ED Now (or PCP triage) Vallery Sa, RN, Cathy Scheduled for 2:45pm appointment with Dr. Elease Hashimoto today.

## 2014-09-26 ENCOUNTER — Telehealth: Payer: Self-pay | Admitting: Family Medicine

## 2014-09-26 NOTE — Telephone Encounter (Signed)
Pt request refill of the following: ALPRAZolam Duanne Moron) 0.25 MG tablet   Phamacy: [place]

## 2014-09-26 NOTE — Telephone Encounter (Signed)
Pt aware that Dr. B is out of the office. Pt is okay with waiting till next week.

## 2014-09-27 NOTE — Telephone Encounter (Signed)
Last refill 12/28/12 #30 0 refill Last visit 08/30/14

## 2014-09-29 NOTE — Telephone Encounter (Signed)
Refill once.  Try to avoid regular use. 

## 2014-10-01 MED ORDER — ALPRAZOLAM 0.25 MG PO TABS: ORAL_TABLET | ORAL | Status: DC

## 2014-10-01 NOTE — Telephone Encounter (Signed)
Rx called in to pharmacy. 

## 2014-10-01 NOTE — Addendum Note (Signed)
Addended by: Marcina Millard on: 10/01/2014 09:37 AM   Modules accepted: Orders

## 2014-10-04 ENCOUNTER — Ambulatory Visit: Payer: Medicare PPO | Admitting: Family Medicine

## 2014-10-30 ENCOUNTER — Encounter: Payer: Self-pay | Admitting: Family Medicine

## 2014-10-30 ENCOUNTER — Ambulatory Visit (INDEPENDENT_AMBULATORY_CARE_PROVIDER_SITE_OTHER): Payer: Medicare PPO | Admitting: Family Medicine

## 2014-10-30 VITALS — BP 128/80 | HR 74 | Temp 98.0°F | Wt 125.0 lb

## 2014-10-30 DIAGNOSIS — R21 Rash and other nonspecific skin eruption: Secondary | ICD-10-CM

## 2014-10-30 MED ORDER — TRIAMCINOLONE ACETONIDE 0.1 % EX CREA: 1.0000 "application " | TOPICAL_CREAM | Freq: Two times a day (BID) | CUTANEOUS | Status: DC | PRN

## 2014-10-30 NOTE — Progress Notes (Signed)
   Subjective:    Patient ID: Heidi Shah, female    DOB: 1945-11-02, 69 y.o.   MRN: [id number]  HPI Patient seen with pruritic slightly scaly and dry skin rash involving her upper eyelids and around her chin region intermittent for the past couple of months. She's had similar rash the past. No clear etiology. No recent change of makeup or any change of soaps or detergent. She takes daily and histamine which did not help much. No pustules or papules.  Denies any generalized rash  Past Medical History  Diagnosis Date  . Chicken pox   . Depression   . Diverticulitis   . Hypertension   . GERD (gastroesophageal reflux disease)   . Cataract     surgery   Past Surgical History  Procedure Laterality Date  . Cataract surgery Bilateral 1996  . Hernia repair  1990  . Neck surgery      as a child    reports that she has never smoked. She has never used smokeless tobacco. She reports that she drinks alcohol. She reports that she does not use illicit drugs. family history includes COPD in her mother; Cancer in her father; Crohn's disease in her mother; Heart disease (age of onset: 58) in her mother; Heart disease (age of onset: 65) in her father. There is no history of Colon cancer, Esophageal cancer, or Stomach cancer. Allergies  Allergen Reactions  . Penicillins       Review of Systems  Constitutional: Negative for fever and chills.  Skin: Positive for rash.       Objective:   Physical Exam  Constitutional: She appears well-developed and well-nourished.  Cardiovascular: Normal rate and regular rhythm.   Pulmonary/Chest: Effort normal and breath sounds normal. No respiratory distress. She has no wheezes.  Skin: Rash noted.  Patient has somewhat nonspecific macular erythematous rash with the patch on her left chin slightly scaly- not well-circumscribed. She has similar mild scaly rash on both upper lids.          Assessment & Plan:  Skin rash. Differential includes  contact dermatitis versus eczematous no clear triggers. Use over-the-counter hydrocortisone 1% cream to upper eyelids as needed. Triamcinolone 0.1% cream twice daily as needed to face and avoid eyes- touch base in 2 weeks if not clearing

## 2014-10-30 NOTE — Progress Notes (Signed)
Pre visit review using our clinic review tool, if applicable. No additional management support is needed unless otherwise documented below in the visit note. 

## 2014-10-30 NOTE — Patient Instructions (Signed)
Try over the counter hydrocortisone cream on eyelids Would try the Triamcinolone cream on other parts of face.

## 2014-11-11 ENCOUNTER — Encounter: Payer: Self-pay | Admitting: Internal Medicine

## 2014-12-17 ENCOUNTER — Telehealth: Payer: Self-pay | Admitting: Family Medicine

## 2014-12-17 NOTE — Telephone Encounter (Signed)
Patient is having increased BP readings. (i.e. 161/99 and 160/98) I have scheduled her for an appointment on Thursday but in the meantime should she increase her medication?

## 2014-12-18 NOTE — Telephone Encounter (Signed)
Should could take one extra Lisinopril 10 mg

## 2014-12-18 NOTE — Telephone Encounter (Signed)
Called and left voicemail for patient to return call.

## 2014-12-18 NOTE — Telephone Encounter (Signed)
Would you like to provide home advice to get through the night until appointment tomorrow?

## 2014-12-19 ENCOUNTER — Ambulatory Visit (INDEPENDENT_AMBULATORY_CARE_PROVIDER_SITE_OTHER): Payer: Medicare PPO | Admitting: Family Medicine

## 2014-12-19 ENCOUNTER — Encounter: Payer: Self-pay | Admitting: Family Medicine

## 2014-12-19 VITALS — BP 120/88 | HR 78 | Temp 98.6°F | Wt 125.0 lb

## 2014-12-19 DIAGNOSIS — I10 Essential (primary) hypertension: Secondary | ICD-10-CM | POA: Diagnosis not present

## 2014-12-19 DIAGNOSIS — Z0000 Encounter for general adult medical examination without abnormal findings: Secondary | ICD-10-CM | POA: Diagnosis not present

## 2014-12-19 MED ORDER — LISINOPRIL 20 MG PO TABS: 20.0000 mg | ORAL_TABLET | Freq: Every day | ORAL | Status: DC

## 2014-12-19 NOTE — Progress Notes (Signed)
   Subjective:    Patient ID: Heidi Shah, female    DOB: 09/21/1945, 69 y.o.   MRN: [id number]  HPI  Patient seen for follow-up hypertension. She called couple days ago with couple of recent blood pressure readings over 923 systolic and over 30Q diastolic. She has recently seen chiropractor for some treatments for her back and has had several elevated readings. She brings in a log of readings today. She currently takes lisinopril 10 mg daily. We had her double up medication couple days ago and blood pressures been fine since then. No headaches. No dizziness. No chest pains. No non-steroidal use. No recent steroid.  Past Medical History  Diagnosis Date  . Chicken pox   . Depression   . Diverticulitis   . Hypertension   . GERD (gastroesophageal reflux disease)   . Cataract     surgery   Past Surgical History  Procedure Laterality Date  . Cataract surgery Bilateral 1996  . Hernia repair  1990  . Neck surgery      as a child    reports that she has never smoked. She has never used smokeless tobacco. She reports that she drinks alcohol. She reports that she does not use illicit drugs. family history includes COPD in her mother; Cancer in her father; Crohn's disease in her mother; Heart disease (age of onset: 5) in her mother; Heart disease (age of onset: 62) in her father. There is no history of Colon cancer, Esophageal cancer, or Stomach cancer. Allergies  Allergen Reactions  . Penicillins      Review of Systems  Constitutional: Negative for fatigue and unexpected weight change.  Eyes: Negative for visual disturbance.  Respiratory: Negative for cough, chest tightness, shortness of breath and wheezing.   Cardiovascular: Negative for chest pain, palpitations and leg swelling.  Neurological: Negative for dizziness, seizures, syncope, weakness, light-headedness and headaches.       Objective:   Physical Exam  Constitutional: She appears well-developed and well-nourished.    Neck: Neck supple. No thyromegaly present.  Cardiovascular: Normal rate and regular rhythm.   Murmur heard. 1/6 systolic ejection murmur right upper sternal border  Pulmonary/Chest: Effort normal and breath sounds normal. No respiratory distress. She has no wheezes. She has no rales.  Musculoskeletal: She exhibits no edema.          Assessment & Plan:  Hypertension. Recent poor control. Get back to regular aerobic exercise such as walking. Increase lisinopril to 20 mg once daily. She will schedule physical for November and reassess then. Continue home monitoring

## 2014-12-19 NOTE — Telephone Encounter (Signed)
Pt called back this am and did not get message last night. Pt would like to know if she should keep her 11 am appt or take your advice concerning med adjustment  And fu after that?

## 2014-12-19 NOTE — Telephone Encounter (Signed)
Patient seen by PCP today.

## 2014-12-19 NOTE — Patient Instructions (Signed)
Go ahead and increase your Lisinopril to 20 mg once daily.

## 2014-12-19 NOTE — Progress Notes (Signed)
Pre visit review using our clinic review tool, if applicable. No additional management support is needed unless otherwise documented below in the visit note. 

## 2014-12-19 NOTE — Telephone Encounter (Signed)
Called patient back and got voicemail. 

## 2014-12-26 ENCOUNTER — Other Ambulatory Visit: Payer: Self-pay

## 2014-12-26 DIAGNOSIS — Z1231 Encounter for screening mammogram for malignant neoplasm of breast: Secondary | ICD-10-CM

## 2015-01-02 ENCOUNTER — Ambulatory Visit: Payer: Medicare PPO

## 2015-01-27 ENCOUNTER — Other Ambulatory Visit: Payer: Medicare PPO

## 2015-01-30 ENCOUNTER — Ambulatory Visit
Admission: RE | Admit: 2015-01-30 | Discharge: 2015-01-30 | Disposition: A | Discharge status: Home / Self Care | Payer: Medicare PPO | Source: Ambulatory Visit

## 2015-01-30 DIAGNOSIS — Z1231 Encounter for screening mammogram for malignant neoplasm of breast: Secondary | ICD-10-CM

## 2015-02-03 ENCOUNTER — Encounter (INDEPENDENT_AMBULATORY_CARE_PROVIDER_SITE_OTHER): Payer: Self-pay

## 2015-02-03 ENCOUNTER — Encounter: Payer: Medicare PPO | Admitting: Family Medicine

## 2015-02-03 ENCOUNTER — Other Ambulatory Visit (INDEPENDENT_AMBULATORY_CARE_PROVIDER_SITE_OTHER): Payer: Medicare PPO

## 2015-02-03 DIAGNOSIS — Z0000 Encounter for general adult medical examination without abnormal findings: Secondary | ICD-10-CM

## 2015-02-03 DIAGNOSIS — Z23 Encounter for immunization: Secondary | ICD-10-CM

## 2015-02-03 DIAGNOSIS — I10 Essential (primary) hypertension: Secondary | ICD-10-CM | POA: Diagnosis not present

## 2015-02-03 LAB — BASIC METABOLIC PANEL
BUN: 15 mg/dL (ref 6–23)
CALCIUM: 10 mg/dL (ref 8.4–10.5)
CO2: 28 mEq/L (ref 19–32)
CREATININE: 0.61 mg/dL (ref 0.40–1.20)
Chloride: 105 mEq/L (ref 96–112)
GFR: 103.98 mL/min (ref >60.00)
GLUCOSE: 92 mg/dL (ref 70–99)
Potassium: 5.2 mEq/L — ABNORMAL HIGH (ref 3.5–5.1)
Sodium: 142 mEq/L (ref 135–145)

## 2015-02-03 LAB — HEPATIC FUNCTION PANEL
ALBUMIN: 4.2 g/dL (ref 3.5–5.2)
ALK PHOS: 52 U/L (ref 39–117)
ALT: 12 U/L (ref 0–35)
AST: 20 U/L (ref 0–37)
BILIRUBIN TOTAL: 0.4 mg/dL (ref 0.2–1.2)
Bilirubin, Direct: 0.1 mg/dL (ref 0.0–0.3)
Total Protein: 6.5 g/dL (ref 6.0–8.3)

## 2015-02-03 LAB — CBC WITH DIFFERENTIAL/PLATELET
BASOS PCT: 0.8 % (ref 0.0–3.0)
Basophils Absolute: 0.1 10*3/uL (ref 0.0–0.1)
EOS PCT: 4.1 % (ref 0.0–5.0)
Eosinophils Absolute: 0.3 10*3/uL (ref 0.0–0.7)
HEMATOCRIT: 42 % (ref 36.0–46.0)
HEMOGLOBIN: 13.7 g/dL (ref 12.0–15.0)
LYMPHS PCT: 34.4 % (ref 12.0–46.0)
Lymphs Abs: 2.2 10*3/uL (ref 0.7–4.0)
MCHC: 32.7 g/dL (ref 30.0–36.0)
MCV: 93.1 fl (ref 78.0–100.0)
MONO ABS: 0.4 10*3/uL (ref 0.1–1.0)
Monocytes Relative: 7 % (ref 3.0–12.0)
Neutro Abs: 3.4 10*3/uL (ref 1.4–7.7)
Neutrophils Relative %: 53.7 % (ref 43.0–77.0)
Platelets: 311 10*3/uL (ref 150.0–400.0)
RBC: 4.51 Mil/uL (ref 3.87–5.11)
RDW: 12.6 % (ref 11.5–15.5)
WBC: 6.3 10*3/uL (ref 4.0–10.5)

## 2015-02-03 LAB — LIPID PANEL
CHOLESTEROL: 260 mg/dL — AB (ref 0–200)
HDL: 85.5 mg/dL (ref >39.00)
LDL Cholesterol: 165 mg/dL — ABNORMAL HIGH (ref 0–99)
NONHDL: 174.74
Total CHOL/HDL Ratio: 3
Triglycerides: 50 mg/dL (ref 0.0–149.0)
VLDL: 10 mg/dL (ref 0.0–40.0)

## 2015-02-03 LAB — TSH: TSH: 0.92 u[IU]/mL (ref 0.35–4.50)

## 2015-02-10 ENCOUNTER — Ambulatory Visit (INDEPENDENT_AMBULATORY_CARE_PROVIDER_SITE_OTHER): Payer: Medicare PPO | Admitting: Family Medicine

## 2015-02-10 ENCOUNTER — Encounter: Payer: Self-pay | Admitting: Family Medicine

## 2015-02-10 VITALS — BP 130/90 | HR 88 | Temp 98.7°F | Resp 16 | Ht 59.0 in | Wt 129.2 lb

## 2015-02-10 DIAGNOSIS — Z0000 Encounter for general adult medical examination without abnormal findings: Secondary | ICD-10-CM

## 2015-02-10 MED ORDER — LISINOPRIL-HYDROCHLOROTHIAZIDE 20-12.5 MG PO TABS: 1.0000 | ORAL_TABLET | Freq: Every day | ORAL | Status: DC

## 2015-02-10 NOTE — Patient Instructions (Signed)
Stop the Lisinopril and start Lisinopril HCTZ one tablet daily. Be in touch if BP not consistently < 150/90.

## 2015-02-10 NOTE — Progress Notes (Signed)
Subjective:    Patient ID: Heidi Shah, female    DOB: 11-Mar-1946, 69 y.o.   MRN: [id number]  HPI Patient seen for complete physical. She is followed by gynecologist regularly. She'll be due for a colonoscopy in 2017. Immunizations are up-to-date. Nonsmoker. Infrequent exercise recently. She's gone back to teaching part-time.  Hypertension currently treated with lisinopril 20 mg daily. She's had consistent blood pressures around 150 year high or systolic and 90 or higher diastolic in recent weeks. She has some fatigue. No headaches. No dizziness. No chest pains. Compliant with therapy.  Frequent abdominal bloating.  She has long history of GERD and remains on Nexium. No appetite or weight changes. No stool changes.  Past Medical History  Diagnosis Date  . Chicken pox   . Depression   . Diverticulitis   . Hypertension   . GERD (gastroesophageal reflux disease)   . Cataract     surgery   Past Surgical History  Procedure Laterality Date  . Cataract surgery Bilateral 1996  . Hernia repair  1990  . Neck surgery      as a child    reports that she has never smoked. She has never used smokeless tobacco. She reports that she drinks alcohol. She reports that she does not use illicit drugs. family history includes COPD in her mother; Cancer in her father; Crohn's disease in her mother; Heart disease (age of onset: 78) in her mother; Heart disease (age of onset: 54) in her father. There is no history of Colon cancer, Esophageal cancer, or Stomach cancer. Allergies  Allergen Reactions  . Penicillins       Review of Systems  Constitutional: Negative for fever, activity change, appetite change, fatigue and unexpected weight change.  HENT: Negative for ear pain, hearing loss, sore throat and trouble swallowing.   Eyes: Negative for visual disturbance.  Respiratory: Negative for cough and shortness of breath.   Cardiovascular: Negative for chest pain and palpitations.    Gastrointestinal: Negative for nausea, vomiting, diarrhea, constipation and blood in stool.  Genitourinary: Negative for dysuria and hematuria.  Musculoskeletal: Negative for myalgias, back pain and arthralgias.  Skin: Negative for rash.  Neurological: Negative for dizziness, syncope and headaches.  Hematological: Negative for adenopathy.  Psychiatric/Behavioral: Negative for confusion and dysphoric mood.       Objective:   Physical Exam  Constitutional: She is oriented to person, place, and time. She appears well-developed and well-nourished.  HENT:  Head: Normocephalic and atraumatic.  Eyes: EOM are normal. Pupils are equal, round, and reactive to light.  Neck: Normal range of motion. Neck supple. No thyromegaly present.  Cardiovascular: Normal rate, regular rhythm and normal heart sounds.   No murmur heard. Pulmonary/Chest: Breath sounds normal. No respiratory distress. She has no wheezes. She has no rales.  Abdominal: Soft. Bowel sounds are normal. She exhibits no distension and no mass. There is no tenderness. There is no rebound and no guarding.  Genitourinary:  Per gyn   Musculoskeletal: Normal range of motion. She exhibits no edema.  Lymphadenopathy:    She has no cervical adenopathy.  Neurological: She is alert and oriented to person, place, and time. She displays normal reflexes. No cranial nerve deficit.  Skin: No rash noted.  Psychiatric: She has a normal mood and affect. Her behavior is normal. Judgment and thought content normal.          Assessment & Plan:  Physical exam. Labs reviewed. No major concerns. She will continue GYN follow-up. Vaccines  up-to-date. Blood pressure has been consistently elevated recently. Change back to lisinopril HCTZ 20/12.5 mg 1 daily. Be in touch of blood pressure not her regular 150/90.  She'll plan to see gastroenterologist soon regarding follow-up colonoscopy

## 2015-03-12 ENCOUNTER — Telehealth: Payer: Self-pay | Admitting: Internal Medicine

## 2015-03-12 NOTE — Telephone Encounter (Signed)
Okay 

## 2015-03-12 NOTE — Telephone Encounter (Signed)
Appointment scheduled with Dr. Perry °

## 2015-04-23 ENCOUNTER — Ambulatory Visit (INDEPENDENT_AMBULATORY_CARE_PROVIDER_SITE_OTHER): Payer: Medicare PPO | Admitting: Family Medicine

## 2015-04-23 VITALS — BP 110/84 | HR 96 | Temp 98.9°F | Wt 130.0 lb

## 2015-04-23 DIAGNOSIS — K219 Gastro-esophageal reflux disease without esophagitis: Secondary | ICD-10-CM | POA: Diagnosis not present

## 2015-04-23 NOTE — Progress Notes (Signed)
Pre visit review using our clinic review tool, if applicable. No additional management support is needed unless otherwise documented below in the visit note. 

## 2015-04-23 NOTE — Progress Notes (Signed)
   Subjective:    Patient ID: Heidi Shah, female    DOB: 05-Dec-1945, 70 y.o.   MRN: [id number]  HPI Patient seen with three-week history of some breakthrough reflux symptoms and upper abdominal bloating. She denies any appetite or weight changes. No nausea or vomiting. No dysphagia. No pain with swallowing. She is currently taking Nexium 20 mg twice daily. She took supplement with Zantac which did control her breakthrough symptoms. She has scheduled follow-up with GI in 3 weeks. She had endoscopy 2015 which showed "nonobstructing stricture of the GE junction ".  Still drinks some caffeine. Symptoms sometimes exacerbated by spicy foods. No regular alcohol use  Past Medical History  Diagnosis Date  . Chicken pox   . Depression   . Diverticulitis   . Hypertension   . GERD (gastroesophageal reflux disease)   . Cataract     surgery   Past Surgical History  Procedure Laterality Date  . Cataract surgery Bilateral 1996  . Hernia repair  1990  . Neck surgery      as a child    reports that she has never smoked. She has never used smokeless tobacco. She reports that she drinks alcohol. She reports that she does not use illicit drugs. family history includes COPD in her mother; Cancer in her father; Crohn's disease in her mother; Heart disease (age of onset: 27) in her mother; Heart disease (age of onset: 70) in her father. There is no history of Colon cancer, Esophageal cancer, or Stomach cancer. Allergies  Allergen Reactions  . Penicillins       Review of Systems  Constitutional: Negative for appetite change and unexpected weight change.  Respiratory: Negative for shortness of breath.   Cardiovascular: Negative for chest pain.  Gastrointestinal: Negative for nausea and vomiting.       Objective:   Physical Exam  Constitutional: She appears well-developed and well-nourished.  HENT:  Mouth/Throat: Oropharynx is clear and moist.  Cardiovascular: Normal rate and regular  rhythm.   Pulmonary/Chest: Effort normal and breath sounds normal. No respiratory distress. She has no wheezes. She has no rales.          Assessment & Plan:  GERD with occasional breakthrough symptoms on Nexium. We've encouraged her to supplement with Zantac or Pepcid as needed. She has follow-up with GI in 3 weeks. We discussed dietary modification. Recommend elevate head of bed about 6 inches

## 2015-04-23 NOTE — Patient Instructions (Signed)
Okay to take Zantac 12 hours after Nexium for GERD symptoms.

## 2015-05-16 ENCOUNTER — Encounter: Payer: Self-pay | Admitting: Internal Medicine

## 2015-05-16 ENCOUNTER — Ambulatory Visit (INDEPENDENT_AMBULATORY_CARE_PROVIDER_SITE_OTHER): Payer: Medicare PPO | Admitting: Internal Medicine

## 2015-05-16 VITALS — BP 110/82 | HR 86 | Ht 59.5 in | Wt 129.6 lb

## 2015-05-16 DIAGNOSIS — R1310 Dysphagia, unspecified: Secondary | ICD-10-CM | POA: Diagnosis not present

## 2015-05-16 DIAGNOSIS — K219 Gastro-esophageal reflux disease without esophagitis: Secondary | ICD-10-CM | POA: Diagnosis not present

## 2015-05-16 DIAGNOSIS — K222 Esophageal obstruction: Secondary | ICD-10-CM | POA: Diagnosis not present

## 2015-05-16 DIAGNOSIS — K589 Irritable bowel syndrome without diarrhea: Secondary | ICD-10-CM

## 2015-05-16 DIAGNOSIS — K625 Hemorrhage of anus and rectum: Secondary | ICD-10-CM

## 2015-05-16 MED ORDER — NA SULFATE-K SULFATE-MG SULF 17.5-3.13-1.6 GM/177ML PO SOLN: 1.0000 | Freq: Once | ORAL | Status: DC

## 2015-05-16 NOTE — Progress Notes (Signed)
HISTORY OF PRESENT ILLNESS:  Heidi Shah is a 70 y.o. female who is referred by her primary care physician Dr. Elease Hashimoto for multiple multiple GI complaints. Previous patient of Dr. Erskine Emery until his departure from the group. First, the patient complains of reflux symptoms. She describes intermittent classic symptoms such as pyrosis and regurgitation. This despite taking Nexium 40 mg in the morning. Occasionally will take Zantac at night. This helps. She has gained 10 pounds over the past 5 years. Next, she reports intermittent solid food dysphagia. For this same complaints she underwent upper endoscopy January 2015 with Dr. Deatra Ina. Distal esophageal stricture noted. However not dilated as complaints of dysphagia had resolved prior to endoscopy. Next, problems with postprandial bloating and gas. Chronic alternating bowel habits with a tendency toward constipation. This is been long-standing. Also intermittent minor rectal bleeding as manifested by red blood on the tissue. She last underwent colonoscopy in 2007 (with me). This was normal except for sigmoid diverticulosis. Routine follow-up in 10 years recommended. No family history of colon cancer.  REVIEW OF SYSTEMS:  All non-GI ROS negative except for allergy, anxiety, back pain, fatigue, sleeping problems  Past Medical History  Diagnosis Date  . Chicken pox   . Depression   . Diverticulitis   . Hypertension   . GERD (gastroesophageal reflux disease)   . Cataract     surgery  . Diverticulosis     Past Surgical History  Procedure Laterality Date  . Cataract surgery Bilateral 1996  . Hernia repair  1990  . Neck surgery      as a child    Social History Heidi Shah  reports that she has never smoked. She has never used smokeless tobacco. She reports that she drinks alcohol. She reports that she does not use illicit drugs.  family history includes COPD in her mother; Cancer in her father; Crohn's disease in her mother;  Heart disease (age of onset: 60) in her mother; Heart disease (age of onset: 68) in her father. There is no history of Colon cancer, Esophageal cancer, or Stomach cancer.  Allergies  Allergen Reactions  . Penicillins        PHYSICAL EXAMINATION: Vital signs: BP 110/82 mmHg  Pulse 86  Ht 4' 11.5" (1.511 m)  Wt 129 lb 9.6 oz (58.786 kg)  BMI 25.75 kg/m2  Constitutional: generally well-appearing, no acute distress Psychiatric: alert and oriented x3, cooperative Eyes: extraocular movements intact, anicteric, conjunctiva pink Mouth: oral pharynx moist, no lesions Neck: supple thyromegaly Lymph: no lymphadenopathy Cardiovascular: heart regular rate and rhythm, no murmur Lungs: clear to auscultation bilaterally Abdomen: soft, nontender, nondistended, no obvious ascites, no peritoneal signs, normal bowel sounds, no organomegaly Rectal: Deferred until colonoscopy Extremities: no clubbing cyanosis or edema lower extremity edema bilaterally Skin: no lesions on visible extremities Neuro: No focal deficits. Cranial nerves intact. No asterixis.  ASSESSMENT:  #1. GERD. Some breakthrough symptoms despite acid suppressive therapy #2. Intermittent solid food dysphagia secondary to known esophageal stricture #3. 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 Minor rectal bleeding #5. Colon cancer screening. Due #6. Gen. medical problems. Stable   PLAN:  #1. Reflux precautions reviewed #2. Reflux precautions with attention to weight loss. Sheet provided #3. Continue Nexium 40 mg in the morning #4. Add ranitidine 150 mg at night #5. Exercise #6. Schedule upper endoscopy with esophageal dilation.The nature of the procedure, as well as the risks, benefits, and alternatives were carefully and thoroughly reviewed with the patient. Ample time for  discussion and questions allowed. The patient understood, was satisfied, and agreed to proceed. #7. Schedule colonoscopy for colon cancer  screening and to evaluate minor rectal bleedingThe nature of the procedure, as well as the risks, benefits, and alternatives were carefully and thoroughly reviewed with the patient. Ample time for discussion and questions allowed. The patient understood, was satisfied, and agreed to proceed. #8. Recommended daily fiber supplementation with Citrucel 1 tablespoon for alternating bowel habits and IBS  40 minutes spent with the patient. Greater than 50% of the time use for counseling regarding her multiple GI issues and the planned treatment

## 2015-05-16 NOTE — Patient Instructions (Signed)
You have been scheduled for an endoscopy and colonoscopy. Please follow the written instructions given to you at your visit today. Please pick up your prep supplies at the pharmacy within the next 1-3 days. If you use inhalers (even only as needed), please bring them with you on the day of your procedure.  

## 2015-06-09 ENCOUNTER — Ambulatory Visit (AMBULATORY_SURGERY_CENTER): Payer: Medicare PPO | Admitting: Internal Medicine

## 2015-06-09 ENCOUNTER — Encounter: Payer: Self-pay | Admitting: Internal Medicine

## 2015-06-09 VITALS — BP 106/60 | HR 73 | Temp 98.6°F | Resp 9 | Ht 59.5 in | Wt 129.0 lb

## 2015-06-09 DIAGNOSIS — R1310 Dysphagia, unspecified: Secondary | ICD-10-CM | POA: Diagnosis not present

## 2015-06-09 DIAGNOSIS — K219 Gastro-esophageal reflux disease without esophagitis: Secondary | ICD-10-CM | POA: Diagnosis not present

## 2015-06-09 DIAGNOSIS — K620 Anal polyp: Secondary | ICD-10-CM

## 2015-06-09 DIAGNOSIS — D122 Benign neoplasm of ascending colon: Secondary | ICD-10-CM

## 2015-06-09 DIAGNOSIS — K222 Esophageal obstruction: Secondary | ICD-10-CM

## 2015-06-09 DIAGNOSIS — K589 Irritable bowel syndrome without diarrhea: Secondary | ICD-10-CM

## 2015-06-09 DIAGNOSIS — Z1211 Encounter for screening for malignant neoplasm of colon: Secondary | ICD-10-CM

## 2015-06-09 MED ORDER — ESOMEPRAZOLE MAGNESIUM 40 MG PO CPDR: 40.0000 mg | DELAYED_RELEASE_CAPSULE | Freq: Two times a day (BID) | ORAL | Status: DC

## 2015-06-09 MED ORDER — SODIUM CHLORIDE 0.9 % IV SOLN: 500.0000 mL | INTRAVENOUS | Status: DC

## 2015-06-09 NOTE — Op Note (Signed)
[place] Patient Name: Heidi Shah Procedure Date: 06/09/2015 2:54 PM MRN: [id number] Endoscopist: Docia Chuck. Henrene Pastor , MD Age: 70 Referring MD:  Date of Birth: February 24, 1946 Gender: Female Procedure:                Upper GI endoscopy and Maloney dilation of the                            esophagus Indications:              Dysphagia, Esophageal reflux Medicines:                Monitored Anesthesia Care Procedure:                Pre-Anesthesia Assessment:                           - Prior to the procedure, a History and Physical                            was performed, and patient medications and                            allergies were reviewed. The patient's tolerance of                            previous anesthesia was also reviewed. The risks                            and benefits of the procedure and the sedation                            options and risks were discussed with the patient.                            All questions were answered, and informed consent                            was obtained. Prior Anticoagulants: The patient has                            taken no previous anticoagulant or antiplatelet                            agents. ASA Grade Assessment: II - A patient with                            mild systemic disease. After reviewing the risks                            and benefits, the patient was deemed in                            satisfactory condition to undergo the procedure.  After obtaining informed consent, the endoscope was                            passed under direct vision. Throughout the                            procedure, the patient's blood pressure, pulse, and                            oxygen saturations were monitored continuously. The                            Model GIF-HQ190 [id number]) scope was introduced                            through the mouth, and advanced to the second part                        of duodenum. The upper GI endoscopy was                            accomplished without difficulty. The patient                            tolerated the procedure well. Scope In: Scope Out: Findings:      LA Grade B (one or more mucosal breaks greater than 5 mm, not extending       between the tops of two mucosal folds) esophagitis was found, distally.      One moderate (circumferential scarring or stenosis; an endoscope may       pass) benign-appearing, intrinsic stenosis was found 35 cm from the       incisors. This measured 1.5 cm (inner diameter). The scope was       withdrawn. Dilation was performed with a Maloney dilator with no       resistance at 42 Fr. Tolerated well      The entire examined stomach was normal.      The duodenal bulb and second portion of the duodenum were normal.      A 4 cm hiatal hernia was present.      The cardia and gastric fundus were normal on retroflexion, with hiatal       hernia noted. Complications:            No immediate complications. Estimated Blood Loss:     Estimated blood loss: none. Impression:               - LA Grade B esophagitis.                           - Benign-appearing esophageal stenosis. Dilated [place].                           - Normal stomach.                           -  Normal duodenal bulb and second portion of the                            duodenum.                           - 4 cm hiatal hernia.                           - No specimens collected. Recommendation:           - Clear liquids for 2 hours, then soft foods, then                            resume previous diet.                           - Prescribe Nexium 40 mg twice daily; #60; 11                            refills.                           - Return to my office in 6 weeks, for follow-up. Procedure Code(s):        --- Professional ---                           [phone], Esophagogastroduodenoscopy,  flexible,                            transoral; diagnostic, including collection of                            specimen(s) by brushing or washing, when performed                            (separate procedure)                           [id number], Dilation of esophagus, by unguided sound or                            bougie, single or multiple passes CPT copyright 2016 American Medical Association. All rights reserved. Docia Chuck. Henrene Pastor, MD Docia Chuck. Henrene Pastor, MD 06/09/2015 3:27:15 PM This report has been signed electronically. Number of Addenda: 0

## 2015-06-09 NOTE — Op Note (Signed)
[place] Patient Name: Heidi Shah Procedure Date: 06/09/2015 2:26 PM MRN: [id number] Endoscopist: Docia Chuck. Henrene Pastor , MD Age: 70 Referring MD:  Date of Birth: Jul 28, 1945 Gender: Female Procedure:                Colonoscopy Indications:              Screening for colorectal malignant neoplasm Medicines:                Monitored Anesthesia Care Procedure:                Pre-Anesthesia Assessment:                           - Prior to the procedure, a History and Physical                            was performed, and patient medications and                            allergies were reviewed. The patient's tolerance of                            previous anesthesia was also reviewed. The risks                            and benefits of the procedure and the sedation                            options and risks were discussed with the patient.                            All questions were answered, and informed consent                            was obtained. Prior Anticoagulants: The patient has                            taken no previous anticoagulant or antiplatelet                            agents. ASA Grade Assessment: II - A patient with                            mild systemic disease. After reviewing the risks                            and benefits, the patient was deemed in                            satisfactory condition to undergo the procedure.                           After obtaining informed consent, the colonoscope  was passed under direct vision. Throughout the                            procedure, the patient's blood pressure, pulse, and                            oxygen saturations were monitored continuously. The                            Model CF-[id number] [id number]) scope was introduced                            through the anus and advanced to the the cecum,                            identified by appendiceal orifice and  ileocecal                            valve. The colonoscopy was performed without                            difficulty. The patient tolerated the procedure                            well. The quality of the bowel preparation was                            excellent. The bowel preparation used was SUPREP.                            The ileocecal valve, appendiceal orifice, and                            rectum were photographed. Scope In: 2:35:11 PM Scope Out: 2:53:37 PM Scope Withdrawal Time: 0 hours 15 minutes 0 seconds  Total Procedure Duration: 0 hours 18 minutes 26 seconds  Findings:      The perianal and digital rectal examinations were normal.      A 5 mm polyp was found in the mid ascending colon. The polyp was       sessile. The polyp was removed with a cold snare. Resection and       retrieval were complete.      A 5 mm polyp was found in the anus, anteriorly. The polyp was       pedunculated, inflamed and somewhat firm, and at the anal verge.       Question anal papilla versus adenoma versus other. The polyp was removed       with a hot snare. Resection and retrieval were complete.      Multiple medium-mouthed diverticula were found in the left colon.      The entire examined colon appeared normal on direct and retroflexion       views. Complications:            No immediate complications. Estimated Blood Loss:     Estimated blood loss: none. Impression:               -  One 5 mm polyp in the mid ascending colon,                            removed with a cold snare. Resected and retrieved.                           - One 5 mm polyp at the anus, removed with a hot                            snare. Resected and retrieved.                           - Diverticulosis in the left colon.                           - The entire examined colon is normal on direct and                            retroflexion views. Recommendation:           - Patient has a contact number available for                             emergencies. The signs and symptoms of potential                            delayed complications were discussed with the                            patient. Return to normal activities tomorrow.                            Written discharge instructions were provided to the                            patient.                           - Resume previous diet.                           - Continue present medications.                           - If the pathology report reveals adenomatous                            tissue, then repeat the colonoscopy for                            surveillance in 5 years, otherwise 10 years.                           -EGD today (please see report) Procedure Code(s):        --- Professional ---  45385, Colonoscopy, flexible; with removal of                            tumor(s), polyp(s), or other lesion(s) by snare                            technique CPT copyright 2016 American Medical Association. All rights reserved. Docia Chuck. Henrene Pastor, MD Docia Chuck. Henrene Pastor, MD 06/09/2015 3:09:26 PM This report has been signed electronically. Number of Addenda: 0

## 2015-06-09 NOTE — Patient Instructions (Signed)
YOU HAD AN ENDOSCOPIC PROCEDURE TODAY AT [facility]:   Refer to the procedure report that was given to you for any specific questions about what was found during the examination.  If the procedure report does not answer your questions, please call your gastroenterologist to clarify.  If you requested that your care partner not be given the details of your procedure findings, then the procedure report has been included in a sealed envelope for you to review at your convenience later.  YOU SHOULD EXPECT: Some feelings of bloating in the abdomen. Passage of more gas than usual.  Walking can help get rid of the air that was put into your GI tract during the procedure and reduce the bloating. If you had a lower endoscopy (such as a colonoscopy or flexible sigmoidoscopy) you may notice spotting of blood in your stool or on the toilet paper. If you underwent a bowel prep for your procedure, you may not have a normal bowel movement for a few days.  Please Note:  You might notice some irritation and congestion in your nose or some drainage.  This is from the oxygen used during your procedure.  There is no need for concern and it should clear up in a day or so.  SYMPTOMS TO REPORT IMMEDIATELY:   Following lower endoscopy (colonoscopy or flexible sigmoidoscopy):  Excessive amounts of blood in the stool  Significant tenderness or worsening of abdominal pains  Swelling of the abdomen that is new, acute  Fever of 100F or higher   Following upper endoscopy (EGD)  Vomiting of blood or coffee ground material  New chest pain or pain under the shoulder blades  Painful or persistently difficult swallowing  New shortness of breath  Fever of 100F or higher  Black, tarry-looking stools  For urgent or emergent issues, a gastroenterologist can be reached at any hour by calling [phone].   DIET: Your first meal following the procedure should be a small meal and then it is ok to progress to  your normal diet. Heavy or fried foods are harder to digest and may make you feel nauseous or bloated.  Clear liquids until 4:30pm.  Then a soft diet for the rest of today.  Regular diet tomorrow. No alcohol for 24 hours.  Tomorrow, you may start your high fiber diet.  ACTIVITY:  You should plan to take it easy for the rest of today and you should NOT DRIVE or use heavy machinery until tomorrow (because of the sedation medicines used during the test).    FOLLOW UP: Our staff will call the number listed on your records the next business day following your procedure to check on you and address any questions or concerns that you may have regarding the information given to you following your procedure. If we do not reach you, we will leave a message.  However, if you are feeling well and you are not experiencing any problems, there is no need to return our call.  We will assume that you have returned to your regular daily activities without incident.  If any biopsies were taken you will be contacted by phone or by letter within the next 1-3 weeks.  Please call us at [phone] if you have not heard about the biopsies in 3 weeks.    SIGNATURES/CONFIDENTIALITY: You and/or your care partner have signed paperwork which will be entered into your electronic medical record.  These signatures attest to the fact that that the information above  on your After Visit Summary has been reviewed and is understood.  Full responsibility of the confidentiality of this discharge information lies with you and/or your care-partner.  Read all of the handouts given to you by your recovery room nurse. Thank-you for choosing [facility] for your healthcare needs today.

## 2015-06-09 NOTE — Progress Notes (Signed)
To recovery, report to [place], RN, VSS 

## 2015-06-09 NOTE — Progress Notes (Signed)
Called to room to assist during endoscopic procedure.  Patient ID and intended procedure confirmed with present staff. Received instructions for my participation in the procedure from the performing physician.  

## 2015-06-10 ENCOUNTER — Telehealth: Payer: Self-pay | Admitting: *Deleted

## 2015-06-10 ENCOUNTER — Telehealth: Payer: Self-pay | Admitting: Family Medicine

## 2015-06-10 NOTE — Telephone Encounter (Signed)
  Follow up Call-  Call back number 06/09/2015 04/20/2013  Post procedure Call Back phone  # [phone]  Permission to leave phone message Yes Yes     Patient questions:  Do you have a fever, pain , or abdominal swelling? No. Pain Score  0 *  Have you tolerated food without any problems? Yes.    Have you been able to return to your normal activities? Yes.    Do you have any questions about your discharge instructions: Diet   No. Medications  No. Follow up visit  No.  Do you have questions or concerns about your Care? No.  Actions: * If pain score is 4 or above: No action needed, pain <4.  Pt complained of soreness and slight discomfort when swallowing.  Advised her to call back if symptoms do not improve.

## 2015-06-10 NOTE — Telephone Encounter (Signed)
Pt is going on a cruise on would like to know if Dr Elease Hashimoto will send in rx  for the dramamine patch. [facility]/ [address]

## 2015-06-11 MED ORDER — SCOPOLAMINE 1 MG/3DAYS TD PT72: MEDICATED_PATCH | TRANSDERMAL | Status: DC

## 2015-06-11 NOTE — Telephone Encounter (Signed)
Thinks she is referring to scopolamine patch. May dispense scopolamine patch one behind ear every 72 hours #6

## 2015-06-11 NOTE — Telephone Encounter (Signed)
Last seen on 04/23/2015.

## 2015-06-11 NOTE — Telephone Encounter (Signed)
Heidi Shah is aware via voicemail that RX has been sent into the pharmacy.

## 2015-06-13 ENCOUNTER — Encounter: Payer: Self-pay | Admitting: Internal Medicine

## 2015-07-17 ENCOUNTER — Ambulatory Visit: Payer: Medicare PPO | Admitting: Internal Medicine

## 2015-08-18 ENCOUNTER — Ambulatory Visit (INDEPENDENT_AMBULATORY_CARE_PROVIDER_SITE_OTHER): Payer: Medicare PPO | Admitting: Internal Medicine

## 2015-08-18 ENCOUNTER — Encounter: Payer: Self-pay | Admitting: Internal Medicine

## 2015-08-18 VITALS — BP 110/78 | HR 76 | Ht 59.0 in | Wt 127.4 lb

## 2015-08-18 DIAGNOSIS — K219 Gastro-esophageal reflux disease without esophagitis: Secondary | ICD-10-CM | POA: Diagnosis not present

## 2015-08-18 DIAGNOSIS — K222 Esophageal obstruction: Secondary | ICD-10-CM

## 2015-08-18 DIAGNOSIS — R1310 Dysphagia, unspecified: Secondary | ICD-10-CM | POA: Diagnosis not present

## 2015-08-18 DIAGNOSIS — K589 Irritable bowel syndrome without diarrhea: Secondary | ICD-10-CM | POA: Diagnosis not present

## 2015-08-18 NOTE — Progress Notes (Signed)
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 Also IBS with a tendency toward constipation. Previous patient of Dr. Erskine Emery. She subsequently underwent colonoscopy and upper endoscopy 06/09/2015. Colonoscopy revealed diminutive colon polyps, one of which was SSP, and diverticulosis. Follow-up in 5 years recommended. Upper endoscopy revealed active esophagitis with peptic stricture formation and 4 cm hiatal hernia. The esophagus was dilated with 54 French Maloney dilator and her Nexium increased to 40 mg twice daily. She presents today for follow-up. The patient took Nexium twice daily for proximal one week then return to once daily dosage. She tells me that her reflux symptoms have improved. No further dysphagia. For her bowel habits fiber recommended. This is helped. No new complaints. She does have multiple questions regarding her diet and long-term treatment plans.  REVIEW OF SYSTEMS:  All non-GI ROS negative except for sinus and allergy, visual change, fatigue, leg cramps, urinary frequency, skin rash  Past Medical History  Diagnosis Date  . Chicken pox   . Depression   . Diverticulitis   . Hypertension   . GERD (gastroesophageal reflux disease)   . Cataract     surgery  . Diverticulosis   . Heart murmur   . Esophageal stricture   . Colon polyps     Past Surgical History  Procedure Laterality Date  . Cataract surgery Bilateral 1996  . Hernia repair  1990  . Neck surgery      as a child    Social History Heidi Shah  reports that she has never smoked. She has never used smokeless tobacco. She reports that she drinks alcohol. She reports that she does not use illicit drugs.  family history includes COPD in her mother; Cancer in her father; Crohn's disease in her mother; Heart disease (age of onset:  15) in her mother; Heart disease (age of onset: 86) in her father. There is no history of Colon cancer, Esophageal cancer, or Stomach cancer.  Allergies  Allergen Reactions  . Penicillins        PHYSICAL EXAMINATION: Vital signs: BP 110/78 mmHg  Pulse 76  Ht 4\' 11"  (1.499 m)  Wt 127 lb 6 oz (57.777 kg)  BMI 25.71 kg/m2 General: Well-developed, well-nourished, no acute distress Abdomen: Not reexamined Psychiatric: alert and oriented x3. Cooperative   ASSESSMENT:  #1. GERD. Recent endoscopy on once daily Nexium with active esophagitis and peptic stricture. PPI increased to twice daily. Patient has reverted back to once daily and denies active symptoms at this time #2. Dysphagia secondary to peptic stricture. Improve post dilation #3. Constipation/IBS. Improved on fiber #4. Sessile serrated polyp on colonoscopy as well as incidental diverticulosis   PLAN:  #1. Reflux precautions #2. Advised to increase PPI to twice daily if significant breakthrough symptoms occur once daily dosage #3. Discussion on long-term PPI use. Potential clinical concerns that have been raised. She understands #4. Continue daily fiber supplementation #5. Surveillance colonoscopy 5 years #6. Routine office follow-up one year. Sooner if needed for questions or problems  25 and spent face-to-face with the patient. Greater than 50% a time use for counseling regarding her various GI diagnoses, treatment, and follow-up. As well, answering multiple questions regarding diet and other issues

## 2015-08-18 NOTE — Patient Instructions (Signed)
Please follow up in one year 

## 2015-11-25 ENCOUNTER — Telehealth: Payer: Self-pay | Admitting: Emergency Medicine

## 2015-11-25 NOTE — Telephone Encounter (Signed)
Refill once.  Avoid regular use. 

## 2015-11-25 NOTE — Telephone Encounter (Signed)
Pt requesting refill Xanax 0.25  Last filled 10/01/14 Refills: 0   Pt last seen in office 04/23/15   Okay to refill?

## 2015-11-26 MED ORDER — ALPRAZOLAM 0.25 MG PO TABS: ORAL_TABLET | ORAL | 0 refills | Status: DC

## 2015-11-26 NOTE — Telephone Encounter (Signed)
Rx refill phoned in to pharmacy.

## 2016-01-12 ENCOUNTER — Other Ambulatory Visit: Payer: Self-pay | Admitting: Family Medicine

## 2016-01-12 DIAGNOSIS — Z1231 Encounter for screening mammogram for malignant neoplasm of breast: Secondary | ICD-10-CM

## 2016-02-05 ENCOUNTER — Ambulatory Visit
Admission: RE | Admit: 2016-02-05 | Discharge: 2016-02-05 | Disposition: A | Discharge status: Home / Self Care | Payer: Medicare PPO | Source: Ambulatory Visit | Attending: Family Medicine | Admitting: Family Medicine

## 2016-02-05 DIAGNOSIS — Z1231 Encounter for screening mammogram for malignant neoplasm of breast: Secondary | ICD-10-CM

## 2016-02-06 ENCOUNTER — Ambulatory Visit: Payer: Medicare PPO

## 2016-02-06 ENCOUNTER — Other Ambulatory Visit (INDEPENDENT_AMBULATORY_CARE_PROVIDER_SITE_OTHER): Payer: Medicare PPO

## 2016-02-06 DIAGNOSIS — Z0000 Encounter for general adult medical examination without abnormal findings: Secondary | ICD-10-CM | POA: Diagnosis not present

## 2016-02-06 LAB — LIPID PANEL
CHOLESTEROL: 249 mg/dL — AB (ref 0–200)
HDL: 79.6 mg/dL (ref >39.00)
LDL Cholesterol: 155 mg/dL — ABNORMAL HIGH (ref 0–99)
NONHDL: 169.31
Total CHOL/HDL Ratio: 3
Triglycerides: 70 mg/dL (ref 0.0–149.0)
VLDL: 14 mg/dL (ref 0.0–40.0)

## 2016-02-06 LAB — CBC WITH DIFFERENTIAL/PLATELET
Basophils Absolute: 0.1 10*3/uL (ref 0.0–0.1)
Basophils Relative: 0.9 % (ref 0.0–3.0)
EOS PCT: 5 % (ref 0.0–5.0)
Eosinophils Absolute: 0.3 10*3/uL (ref 0.0–0.7)
HEMATOCRIT: 40.4 % (ref 36.0–46.0)
Hemoglobin: 13.5 g/dL (ref 12.0–15.0)
LYMPHS ABS: 2.4 10*3/uL (ref 0.7–4.0)
LYMPHS PCT: 39.2 % (ref 12.0–46.0)
MCHC: 33.5 g/dL (ref 30.0–36.0)
MCV: 91.9 fl (ref 78.0–100.0)
MONOS PCT: 10.7 % (ref 3.0–12.0)
Monocytes Absolute: 0.6 10*3/uL (ref 0.1–1.0)
NEUTROS ABS: 2.7 10*3/uL (ref 1.4–7.7)
NEUTROS PCT: 44.2 % (ref 43.0–77.0)
PLATELETS: 359 10*3/uL (ref 150.0–400.0)
RBC: 4.4 Mil/uL (ref 3.87–5.11)
RDW: 12.5 % (ref 11.5–15.5)
WBC: 6 10*3/uL (ref 4.0–10.5)

## 2016-02-06 LAB — HEPATIC FUNCTION PANEL
ALBUMIN: 4.3 g/dL (ref 3.5–5.2)
ALK PHOS: 51 U/L (ref 39–117)
ALT: 11 U/L (ref 0–35)
AST: 18 U/L (ref 0–37)
Bilirubin, Direct: 0.1 mg/dL (ref 0.0–0.3)
TOTAL PROTEIN: 6.5 g/dL (ref 6.0–8.3)
Total Bilirubin: 0.4 mg/dL (ref 0.2–1.2)

## 2016-02-06 LAB — BASIC METABOLIC PANEL
BUN: 26 mg/dL — ABNORMAL HIGH (ref 6–23)
CHLORIDE: 102 meq/L (ref 96–112)
CO2: 30 meq/L (ref 19–32)
Calcium: 9.7 mg/dL (ref 8.4–10.5)
Creatinine, Ser: 0.68 mg/dL (ref 0.40–1.20)
GFR: 91.45 mL/min (ref >60.00)
GLUCOSE: 87 mg/dL (ref 70–99)
POTASSIUM: 4.5 meq/L (ref 3.5–5.1)
SODIUM: 140 meq/L (ref 135–145)

## 2016-02-06 LAB — TSH: TSH: 0.72 u[IU]/mL (ref 0.35–4.50)

## 2016-02-13 ENCOUNTER — Ambulatory Visit (INDEPENDENT_AMBULATORY_CARE_PROVIDER_SITE_OTHER): Payer: Medicare PPO | Admitting: Family Medicine

## 2016-02-13 ENCOUNTER — Encounter: Payer: Self-pay | Admitting: Family Medicine

## 2016-02-13 VITALS — BP 128/82 | HR 97 | Temp 97.9°F | Ht 59.0 in | Wt 129.0 lb

## 2016-02-13 DIAGNOSIS — Z0100 Encounter for examination of eyes and vision without abnormal findings: Secondary | ICD-10-CM

## 2016-02-13 DIAGNOSIS — Z780 Asymptomatic menopausal state: Secondary | ICD-10-CM

## 2016-02-13 DIAGNOSIS — Z0000 Encounter for general adult medical examination without abnormal findings: Secondary | ICD-10-CM

## 2016-02-13 DIAGNOSIS — Z23 Encounter for immunization: Secondary | ICD-10-CM

## 2016-02-13 NOTE — Progress Notes (Signed)
Pre visit review using our clinic review tool, if applicable. No additional management support is needed unless otherwise documented below in the visit note. 

## 2016-02-13 NOTE — Progress Notes (Signed)
Subjective:     Patient ID: Heidi Shah, female   DOB: May 14, 1945, 70 y.o.   MRN: [id number]  HPI Patient seen for physical exam. She has history of GERD and underwent EGD with esophageal dilatation and colonoscopy last year. She was instructed to take Nexium 40 mg twice a day but recently been taking once daily. She's not had any recent dysphagia or reflux symptoms but has occasional clearing of throat. Last tetanus was 10 years ago. She's already had flu vaccine. Last bone density about 3-4 years ago. Mammogram just last week normal. Her other chronic problems include history of hypertension and hyperlipidemia. Never smoked.  Previous Pap smears through GYN. She states these have been all normal recently. She had Pap smear last year  Past Medical History:  Diagnosis Date  . Cataract    surgery  . Chicken pox   . Colon polyps   . Depression   . Diverticulitis   . Diverticulosis   . Esophageal stricture   . GERD (gastroesophageal reflux disease)   . Heart murmur   . Hypertension    Past Surgical History:  Procedure Laterality Date  . cataract surgery Bilateral 1996  . HERNIA REPAIR  1990  . NECK SURGERY     as a child    reports that she has never smoked. She has never used smokeless tobacco. She reports that she drinks alcohol. She reports that she does not use drugs. family history includes COPD in her mother; Cancer in her father; Crohn's disease in her mother; Heart disease (age of onset: 60) in her mother; Heart disease (age of onset: 24) in her father. Allergies  Allergen Reactions  . Penicillins      Review of Systems  Constitutional: Negative for activity change, appetite change, fatigue, fever and unexpected weight change.  HENT: Negative for ear pain, hearing loss, sore throat and trouble swallowing.   Eyes: Negative for visual disturbance.  Respiratory: Negative for cough and shortness of breath.   Cardiovascular: Negative for chest pain and palpitations.   Gastrointestinal: Negative for abdominal pain, blood in stool, constipation and diarrhea.  Genitourinary: Negative for dysuria and hematuria.  Musculoskeletal: Negative for arthralgias, back pain and myalgias.  Skin: Negative for rash.  Neurological: Negative for dizziness, syncope and headaches.  Hematological: Negative for adenopathy.  Psychiatric/Behavioral: Negative for confusion and dysphoric mood.       Objective:   Physical Exam  Constitutional: She is oriented to person, place, and time. She appears well-developed and well-nourished.  HENT:  Head: Normocephalic and atraumatic.  Eyes: EOM are normal. Pupils are equal, round, and reactive to light.  Neck: Normal range of motion. Neck supple. No thyromegaly present.  Cardiovascular: Normal rate, regular rhythm and normal heart sounds.   No murmur heard. Pulmonary/Chest: Breath sounds normal. No respiratory distress. She has no wheezes. She has no rales.  Abdominal: Soft. Bowel sounds are normal. She exhibits no distension and no mass. There is no tenderness. There is no rebound and no guarding.  Musculoskeletal: Normal range of motion. She exhibits no edema.  Lymphadenopathy:    She has no cervical adenopathy.  Neurological: She is alert and oriented to person, place, and time. She displays normal reflexes. No cranial nerve deficit.  Skin: No rash noted.  Psychiatric: She has a normal mood and affect. Her behavior is normal. Judgment and thought content normal.       Assessment:     Physical exam. Patient needs follow-up bone density scan and flu  vaccine. She wishes to wait on tetanus    Plan:     -Flu vaccine given -Consider tetanus booster later this year -Set up DEXA scan -Continue regular weightbearing exercise -Consider Pap smear by next year  Eulas Post MD [facility] at [facility]

## 2016-02-13 NOTE — Patient Instructions (Signed)
Consider tetanus booster later this year Check on coverage for nutrition referral. Consider pap smear by next year.

## 2016-02-17 ENCOUNTER — Other Ambulatory Visit: Payer: Self-pay | Admitting: Family Medicine

## 2016-02-26 ENCOUNTER — Telehealth: Payer: Self-pay | Admitting: Family Medicine

## 2016-02-26 ENCOUNTER — Ambulatory Visit (INDEPENDENT_AMBULATORY_CARE_PROVIDER_SITE_OTHER)
Admission: RE | Admit: 2016-02-26 | Discharge: 2016-02-26 | Disposition: A | Discharge status: Home / Self Care | Payer: Medicare PPO | Source: Ambulatory Visit | Attending: Family Medicine | Admitting: Family Medicine

## 2016-02-26 DIAGNOSIS — Z713 Dietary counseling and surveillance: Secondary | ICD-10-CM

## 2016-02-26 DIAGNOSIS — Z780 Asymptomatic menopausal state: Secondary | ICD-10-CM | POA: Diagnosis not present

## 2016-02-26 NOTE — Telephone Encounter (Signed)
Pt has check with her insurance and she would like a referral to [facility]. Pt saw md on 02-13-16. Pt is aware md out of office

## 2016-02-27 NOTE — Telephone Encounter (Signed)
OK to refer.

## 2016-02-27 NOTE — Telephone Encounter (Signed)
Order entered for referral.  

## 2016-02-27 NOTE — Telephone Encounter (Signed)
Please advise if okay for referral 

## 2016-03-02 ENCOUNTER — Other Ambulatory Visit: Payer: Self-pay | Admitting: Family Medicine

## 2016-03-02 DIAGNOSIS — E559 Vitamin D deficiency, unspecified: Secondary | ICD-10-CM

## 2016-03-08 ENCOUNTER — Other Ambulatory Visit (INDEPENDENT_AMBULATORY_CARE_PROVIDER_SITE_OTHER): Payer: Medicare PPO

## 2016-03-08 DIAGNOSIS — E559 Vitamin D deficiency, unspecified: Secondary | ICD-10-CM

## 2016-03-11 LAB — VITAMIN D 1,25 DIHYDROXY
VITAMIN D 1, 25 (OH) TOTAL: 54 pg/mL (ref 18–72)
Vitamin D3 1, 25 (OH)2: 54 pg/mL

## 2016-03-26 ENCOUNTER — Encounter: Payer: Self-pay | Admitting: Skilled Nursing Facility1

## 2016-03-26 ENCOUNTER — Encounter: Payer: Medicare PPO | Attending: Family Medicine | Admitting: Skilled Nursing Facility1

## 2016-03-26 DIAGNOSIS — Z713 Dietary counseling and surveillance: Secondary | ICD-10-CM | POA: Insufficient documentation

## 2016-03-26 DIAGNOSIS — E7800 Pure hypercholesterolemia, unspecified: Secondary | ICD-10-CM | POA: Insufficient documentation

## 2016-03-26 DIAGNOSIS — I10 Essential (primary) hypertension: Secondary | ICD-10-CM | POA: Diagnosis not present

## 2016-03-26 DIAGNOSIS — K5732 Diverticulitis of large intestine without perforation or abscess without bleeding: Secondary | ICD-10-CM

## 2016-03-26 NOTE — Progress Notes (Signed)
Medical Nutrition Therapy:  Appt start time: 7:58 end time:  8:43   Assessment:  Primary concerns today. Pt states she is having a lot of problems with her stomach and reflux. Pt states she needs calcium but she cannot tolerate the supplements. Pt states the calcium supplements cause her to be constipated and bloated. Pt states she has a lot of gas, cramping, bloating: pt states she is intolerant to dairy. Pt states she goes 3-4 days without a bowel movement and then every day for 5 days. Pt states anything with milk gives her gas such as cookies and creamed potatoes. Pt states she has taken tums without any issue. Pt states peanuts cause her stomach to cramp and has to have a bowel movement immediately after she eats them. Pt states eggs and mayonnaise cause her to have a bowel movement immediately after she eats them. Pt states she eats fried foods 3 days a week.   Preferred Learning Style:   No preference indicated   Learning Readiness:   Contemplating  MEDICATIONS: See List   DIETARY INTAKE:  Usual eating pattern includes 3 meals and 2 snacks per day.  Everyday foods include none stated.  Avoided foods include a number of items.    24-hr recall:  B ( AM): oatmeal or peanut butter crackers  Snk ( AM): candy L ( PM): lean cuisine----turkey sandwich---hot dog Snk ( PM): crackers D ( PM): hamburger----chicken wings Snk ( PM):  Beverages: water, unsweet tea, diet soda, wine   Usual physical activity: ADL's  Estimated energy needs: 1400 calories 158 g carbohydrates 105 g protein 39 g fat  Progress Towards Goal(s):  In progress.   Nutritional Diagnosis:  NB-1.1 Food and nutrition-related knowledge deficit As related to Undoagnosed GI issues.  As evidenced by pt reported symptoms and hypertension.    Intervention:  Nutrition counseling for GI issues. Dietitian educated the pt on causes of gas/bloating, probiotic, calcium sources, Reflux, and the importance of physical  activity. Goals: -Look into trying tums again   -Avoid fried foods as much as possible  -Go grocery shopping at least once a week to once every other week to have foods available to make meals  -Try to insert unsweet almond milk in your diet  -With your oatmeal a spoonful of peanut butter   -Nut butters can be a good source of protein   -Chew very well: 20 times per bite   -Try to drink more water by keeping up weaning off the sodas  -Avoid drinking your tea with meals   -Read your probiotic and make sure it has a variety of bacteria  -Consistently eating your fruits and vegetables will help with your GI  -Start off with no peels and softer not as stringy vegetables  -Give the sardines a try if you cannot get other good sources of calcium in   -For your juice: 100% fruit juice with added calcium   -For your bread focus on at least 3 grams of fiber per slice  -Anytime you buy a food product look and see if they have a calcium fortified option   -2 days a week ride the bike for 60 minutes: also at least 1 day a week do some sort of weights or resistance exercise   Teaching Method Utilized:  Visual Auditory  Handouts given during visit include:  Calcium rich foods  Barriers to learning/adherence to lifestyle change: GI issues  Demonstrated degree of understanding via:  Teach Back   Monitoring/Evaluation:  Dietary intake, exercise, and body weight prn.

## 2016-03-26 NOTE — Patient Instructions (Addendum)
-  Look into trying tums again   -Avoid fried foods as much as possible  -Go grocery shopping at least once a week to once every other week to have foods available to make meals  -Try to insert unsweet almond milk in your diet  -With your oatmeal a spoonful of peanut butter   -Nut butters can be a good source of protein   -Chew very well: 20 times per bite   -Try to drink more water by keeping up weaning off the sodas  -Avoid drinking your tea with meals   -Read your probiotic and make sure it has a variety of bacteria  -Consistently eating your fruits and vegetables will help with your GI  -Start off with no peels and softer not as stringy vegetables  -Give the sardines a try if you cannot get other good sources of calcium in   -For your juice: 100% fruit juice with added calcium   -For your bread focus on at least 3 grams of fiber per slice  -Anytime you buy a food product look and see if they have a calcium fortified option   -2 days a week ride the bike for 60 minutes: also at least 1 day a week do some sort of weights or resistance exercise

## 2016-04-23 ENCOUNTER — Ambulatory Visit (INDEPENDENT_AMBULATORY_CARE_PROVIDER_SITE_OTHER): Payer: Medicare PPO | Admitting: Family Medicine

## 2016-04-23 VITALS — BP 124/82 | HR 86 | Ht 59.0 in | Wt 130.6 lb

## 2016-04-23 DIAGNOSIS — L608 Other nail disorders: Secondary | ICD-10-CM

## 2016-04-23 NOTE — Patient Instructions (Signed)
We will set up follow up with Dr Elvera Lennox.

## 2016-04-23 NOTE — Progress Notes (Signed)
Pre visit review using our clinic review tool, if applicable. No additional management support is needed unless otherwise documented below in the visit note. 

## 2016-04-23 NOTE — Progress Notes (Signed)
Subjective:     Patient ID: Heidi Shah, female   DOB: [birth year], 71 y.o.   MRN: [id number]  HPI   Patient seen with discoloration of several nails. She first noticed some discoloration right great toenail about a year ago. She's not had any associated pain. Denies any history of trauma. Few months ago noticed some discoloration of her left great toenail and then just recently after removing polish which been on for couple months she noticed some darkened discoloration of the left third toe. She was concerned she may have some type of fungal infection. She's not noted any brittle nail changes or abnormal thickening.  Past Medical History:  Diagnosis Date  . Cataract    surgery  . Chicken pox   . Colon polyps   . Depression   . Diverticulitis   . Diverticulosis   . Esophageal stricture   . GERD (gastroesophageal reflux disease)   . Heart murmur   . Hypertension    Past Surgical History:  Procedure Laterality Date  . cataract surgery Bilateral 1996  . HERNIA REPAIR  1990  . NECK SURGERY     as a child    reports that she has never smoked. She has never used smokeless tobacco. She reports that she drinks alcohol. She reports that she does not use drugs. family history includes COPD in her mother; Cancer in her father; Crohn's disease in her mother; Heart disease (age of onset: 75) in her mother; Heart disease (age of onset: 76) in her father. Allergies  Allergen Reactions  . Penicillins      Review of Systems  Constitutional: Negative for chills and fever.       Objective:   Physical Exam  Constitutional: She appears well-developed and well-nourished.  Cardiovascular: Normal rate and regular rhythm.   Pulmonary/Chest: Effort normal and breath sounds normal. No respiratory distress. She has no wheezes. She has no rales.  Skin:  Right great toe reveals some discoloration mostly involving the distal half. She has some evidence for onycholysis. No abnormal thickening  or brittle changes.  She has similar involvement of the left first and third toes. No evidence for subungual hematoma. No surrounding soft tissue swelling.       Assessment:     Discoloration of several toenails including right first and left first and third toes.?onycholysis.    Plan:     -Recommend Derm referral for second opinion -will set up with Dr Elvera Lennox, whom she has seen in past.  Eulas Post MD [facility] at [facility]'

## 2016-06-03 ENCOUNTER — Telehealth: Payer: Self-pay | Admitting: Family Medicine

## 2016-06-03 MED ORDER — SCOPOLAMINE 1 MG/3DAYS TD PT72: MEDICATED_PATCH | TRANSDERMAL | 0 refills | Status: DC

## 2016-06-03 NOTE — Telephone Encounter (Signed)
Scopolamine patch one patch behind the ear every 72 hours prn motion sickness prevention   Disp #9

## 2016-06-03 NOTE — Telephone Encounter (Signed)
° ° ° °  Pt is requesting a rx for motion sickness she is going on a [place]

## 2016-06-03 NOTE — Telephone Encounter (Signed)
Rx done. 

## 2016-06-07 ENCOUNTER — Ambulatory Visit: Payer: Medicare PPO | Admitting: Internal Medicine

## 2016-07-16 ENCOUNTER — Ambulatory Visit: Payer: Medicare PPO | Admitting: Internal Medicine

## 2016-08-02 ENCOUNTER — Encounter: Payer: Self-pay | Admitting: Family Medicine

## 2016-08-02 ENCOUNTER — Ambulatory Visit (INDEPENDENT_AMBULATORY_CARE_PROVIDER_SITE_OTHER): Payer: Medicare PPO | Admitting: Family Medicine

## 2016-08-02 VITALS — BP 110/70 | HR 88 | Temp 98.5°F | Wt 127.8 lb

## 2016-08-02 DIAGNOSIS — M5416 Radiculopathy, lumbar region: Secondary | ICD-10-CM

## 2016-08-02 NOTE — Progress Notes (Signed)
Pre visit review using our clinic review tool, if applicable. No additional management support is needed unless otherwise documented below in the visit note. 

## 2016-08-02 NOTE — Progress Notes (Signed)
Subjective:     Patient ID: Heidi Shah, female   DOB: 1946/02/25, 71 y.o.   MRN: [id number]  HPI Patient seen with approximately one-month history of pain radiating down her left lower extremity. She recalls twisting and felt a 'pop' sensation in her left lower lumbar area-about a month ago. No real pain at the time. She's had since that time though some achy quality pain which is somewhat intermittent posterior lateral aspect the left lower extremity occasionally below the knee. 8 out of 10 severity at its worst. Achy quality. No exacerbating factors. She taken Advil without much relief but only infrequently. Denies any urine or stool incontinence. No appetite or weight changes. No history of cancer. No fever. No chills. No dysuria.  No prior hx of major back difficulties.   Past Medical History:  Diagnosis Date  . Cataract    surgery  . Chicken pox   . Colon polyps   . Depression   . Diverticulitis   . Diverticulosis   . Esophageal stricture   . GERD (gastroesophageal reflux disease)   . Heart murmur   . Hypertension    Past Surgical History:  Procedure Laterality Date  . cataract surgery Bilateral 1996  . HERNIA REPAIR  1990  . NECK SURGERY     as a child    reports that she has never smoked. She has never used smokeless tobacco. She reports that she drinks alcohol. She reports that she does not use drugs. family history includes COPD in her mother; Cancer in her father; Crohn's disease in her mother; Heart disease (age of onset: 51) in her mother; Heart disease (age of onset: 84) in her father. Allergies  Allergen Reactions  . Penicillins      Review of Systems  Constitutional: Negative for appetite change, chills, fever and unexpected weight change.  Gastrointestinal: Negative for abdominal pain.  Genitourinary: Negative for dysuria.  Musculoskeletal: Negative for back pain.  Skin: Negative for rash.  Neurological: Negative for weakness.  Hematological:  Negative for adenopathy.       Objective:   Physical Exam  Constitutional: She appears well-developed and well-nourished.  Cardiovascular: Normal rate and regular rhythm.   Pulmonary/Chest: Effort normal and breath sounds normal. No respiratory distress. She has no wheezes. She has no rales.  Musculoskeletal: She exhibits no edema.  Straight leg raises are negative bilaterally Patient is able flex back 90 without difficulty and extend 20 without pain  Excellent distal pulses  Neurological:  She has diminished reflexes knee and ankle bilaterally. Full-strength with plantar flexion, dorsiflexion, knee extension bilaterally       Assessment:     One-month history of left lumbar radiculitis symptoms with nonfocal neuro exam at this time. Symptoms somewhat intermittent as above    Plan:     -We discussed possible trial of prednisone but patient is reluctant. She is concerned about possible side effects -She will continue over-the-counter ibuprofen twice daily as needed. She is cautioned about risk of ulcers with prolonged use. Take with food. -We reviewed simple extension stretches -Walking and other aerobic activity as tolerated but avoid any heavy lifting or frequent back flexion -Touch base in 2-3 weeks if not improving -may eventually need MRI to assess if not improving.  Eulas Post MD [facility] at [facility]

## 2016-08-02 NOTE — Patient Instructions (Signed)
Lumbosacral Radiculopathy Lumbosacral radiculopathy is a condition that involves the spinal nerves and nerve roots in the low back and bottom of the spine. The condition develops when these nerves and nerve roots move out of place or become inflamed and cause symptoms. What are the causes? This condition may be caused by:  Pressure from a disk that bulges out of place (herniated disk). A disk is a plate of cartilage that separates bones in the spine.  Disk degeneration.  A narrowing of the bones of the lower back (spinal stenosis).  A tumor.  An infection.  An injury that places sudden pressure on the disks that cushion the bones of your lower spine. What increases the risk? This condition is more likely to develop in:  Males aged 30-50 years.  Females aged 38-60 years.  People who lift improperly.  People who are overweight or live a sedentary lifestyle.  People who smoke.  People who perform repetitive activities that strain the spine. What are the signs or symptoms? Symptoms of this condition include:  Pain that goes down from the back into the legs (sciatica). This is the most common symptom. The pain may be worse with sitting, coughing, or sneezing.  Pain and numbness in the arms and legs.  Muscle weakness.  Tingling.  Loss of bladder control or bowel control. How is this diagnosed? This condition is diagnosed with a physical exam and medical history. If the pain is lasting, you may have tests, such as:  MRI scan.  X-ray.  CT scan.  Myelogram.  Nerve conduction study. How is this treated? This condition is often treated with:  Hot packs and ice applied to affected areas.  Stretches to improve flexibility.  Exercises to strengthen back muscles.  Physical therapy.  Pain medicine.  A steroid injection in the spine. In some cases, no treatment is needed. If the condition is long-lasting (chronic), or if symptoms are severe, treatment may involve  surgery or lifestyle changes, such as following a weight loss plan. Follow these instructions at home: Medicines   Take medicines only as directed by your health care provider.  Do not drive or operate heavy machinery while taking pain medicine. Injury care   Apply a heat pack to the injured area as directed by your health care provider.  Apply ice to the affected area:  Put ice in a plastic bag.  Place a towel between your skin and the bag.  Leave the ice on for 20-30 minutes, every 2 hours while you are awake or as needed. Or, leave the ice on for as long as directed by your health care provider. Other Instructions   If you were shown how to do any exercises or stretches, do them as directed by your health care provider.  If your health care provider prescribed a diet or exercise program, follow it as directed.  Keep all follow-up visits as directed by your health care provider. This is important. Contact a health care provider if:  Your pain does not improve over time even when taking pain medicines. Get help right away if:  Your develop severe pain.  Your pain suddenly gets worse.  You develop increasing weakness in your legs.  You lose the ability to control your bladder or bowel.  You have difficulty walking or balancing.  You have a fever. This information is not intended to replace advice given to you by your health care provider. Make sure you discuss any questions you have with your health care  provider. Document Released: 03/15/2005 Document Revised: 08/21/2015 Document Reviewed: 03/11/2014 Elsevier Interactive Patient Education  2017 [employer].

## 2016-08-09 ENCOUNTER — Ambulatory Visit: Payer: Medicare PPO | Admitting: Internal Medicine

## 2016-08-12 ENCOUNTER — Other Ambulatory Visit: Payer: Self-pay | Admitting: Internal Medicine

## 2016-08-12 DIAGNOSIS — R1310 Dysphagia, unspecified: Secondary | ICD-10-CM

## 2016-08-12 DIAGNOSIS — K222 Esophageal obstruction: Secondary | ICD-10-CM

## 2016-08-12 DIAGNOSIS — K219 Gastro-esophageal reflux disease without esophagitis: Secondary | ICD-10-CM

## 2016-08-13 ENCOUNTER — Telehealth: Payer: Self-pay | Admitting: Family Medicine

## 2016-08-13 MED ORDER — PREDNISONE 10 MG PO TABS: ORAL_TABLET | ORAL | 0 refills | Status: DC

## 2016-08-13 NOTE — Telephone Encounter (Signed)
Rx done and I called the pt and informed her of this. 

## 2016-08-13 NOTE — Telephone Encounter (Signed)
May send in prednisone taper  Prednisone 10 mg taper: 4-4-4-3-3-2-2  #22

## 2016-08-13 NOTE — Telephone Encounter (Signed)
Pt would like to have a call back to discuss prednisone before she is prescribed it.

## 2016-08-13 NOTE — Telephone Encounter (Signed)
I called the pt for more information and she stated she was reluctant to take Prednisone at the visit on 5/7 and now would like an Rx as states she has been taking Ibuprofen which has not helped much with the left hip pain that radiates down her leg.  Patient asked if she could be given only a 7-da course as mentioned at her visit or would prefer to have a shot? Patient asked that a detailed message be left for her if she does not answer the phone. Message sent to Dr Elease Hashimoto.

## 2016-08-20 ENCOUNTER — Telehealth: Payer: Self-pay | Admitting: Family Medicine

## 2016-08-20 NOTE — Telephone Encounter (Signed)
Pt was on prednisone for 7 day and started on 08-14-16. Pt was taking med for pinched nerve in hip and leg. [employer] at [employer]. Pt stated prednisone did not help

## 2016-08-23 NOTE — Telephone Encounter (Signed)
Pt now with 2 month hx of low back pain with left lumbar radiculitis symptoms not relieved with conservative measures. Set up MRI lumbar spine without contrast.

## 2016-08-25 NOTE — Telephone Encounter (Signed)
Spoke with pt and she has seen Dr. Tonita Cong since original message. He has scheduled pt to have MRI and will manage her back pain. Nothing further needed at this time.

## 2016-08-25 NOTE — Telephone Encounter (Signed)
LMTCB

## 2017-01-03 ENCOUNTER — Other Ambulatory Visit: Payer: Self-pay | Admitting: Family Medicine

## 2017-01-03 DIAGNOSIS — Z1231 Encounter for screening mammogram for malignant neoplasm of breast: Secondary | ICD-10-CM

## 2017-01-27 LAB — HM MAMMOGRAPHY

## 2017-02-11 ENCOUNTER — Other Ambulatory Visit: Payer: Self-pay | Admitting: Family Medicine

## 2017-02-14 ENCOUNTER — Encounter: Payer: Self-pay | Admitting: Family Medicine

## 2017-02-14 ENCOUNTER — Ambulatory Visit
Admission: RE | Admit: 2017-02-14 | Discharge: 2017-02-14 | Disposition: A | Discharge status: Home / Self Care | Payer: Medicare PPO | Source: Ambulatory Visit | Attending: Family Medicine | Admitting: Family Medicine

## 2017-02-14 ENCOUNTER — Other Ambulatory Visit (HOSPITAL_COMMUNITY)
Admission: RE | Admit: 2017-02-14 | Discharge: 2017-02-14 | Disposition: A | Discharge status: Home / Self Care | Payer: Medicare PPO | Source: Ambulatory Visit | Attending: Family Medicine | Admitting: Family Medicine

## 2017-02-14 ENCOUNTER — Ambulatory Visit (INDEPENDENT_AMBULATORY_CARE_PROVIDER_SITE_OTHER): Payer: Medicare PPO | Admitting: Family Medicine

## 2017-02-14 VITALS — BP 122/80 | Temp 98.4°F | Ht 59.0 in | Wt 129.9 lb

## 2017-02-14 DIAGNOSIS — Z1231 Encounter for screening mammogram for malignant neoplasm of breast: Secondary | ICD-10-CM

## 2017-02-14 DIAGNOSIS — Z0000 Encounter for general adult medical examination without abnormal findings: Secondary | ICD-10-CM | POA: Insufficient documentation

## 2017-02-14 LAB — CBC WITH DIFFERENTIAL/PLATELET
BASOS ABS: 0.1 10*3/uL (ref 0.0–0.1)
Basophils Relative: 1.4 % (ref 0.0–3.0)
EOS ABS: 0.2 10*3/uL (ref 0.0–0.7)
Eosinophils Relative: 4.7 % (ref 0.0–5.0)
HCT: 40.5 % (ref 36.0–46.0)
Hemoglobin: 13.3 g/dL (ref 12.0–15.0)
LYMPHS ABS: 2 10*3/uL (ref 0.7–4.0)
Lymphocytes Relative: 46.7 % — ABNORMAL HIGH (ref 12.0–46.0)
MCHC: 32.9 g/dL (ref 30.0–36.0)
MCV: 95.1 fl (ref 78.0–100.0)
MONO ABS: 0.4 10*3/uL (ref 0.1–1.0)
Monocytes Relative: 10 % (ref 3.0–12.0)
Neutro Abs: 1.6 10*3/uL (ref 1.4–7.7)
Neutrophils Relative %: 37.2 % — ABNORMAL LOW (ref 43.0–77.0)
Platelets: 331 10*3/uL (ref 150.0–400.0)
RBC: 4.26 Mil/uL (ref 3.87–5.11)
RDW: 12.6 % (ref 11.5–15.5)
WBC: 4.4 10*3/uL (ref 4.0–10.5)

## 2017-02-14 LAB — HEPATIC FUNCTION PANEL
ALBUMIN: 4.3 g/dL (ref 3.5–5.2)
ALK PHOS: 47 U/L (ref 39–117)
ALT: 13 U/L (ref 0–35)
AST: 21 U/L (ref 0–37)
BILIRUBIN DIRECT: 0.1 mg/dL (ref 0.0–0.3)
BILIRUBIN TOTAL: 0.5 mg/dL (ref 0.2–1.2)
Total Protein: 6.4 g/dL (ref 6.0–8.3)

## 2017-02-14 LAB — LIPID PANEL
CHOL/HDL RATIO: 3
Cholesterol: 258 mg/dL — ABNORMAL HIGH (ref 0–200)
HDL: 81.8 mg/dL (ref >39.00)
LDL Cholesterol: 159 mg/dL — ABNORMAL HIGH (ref 0–99)
NONHDL: 175.93
Triglycerides: 84 mg/dL (ref 0.0–149.0)
VLDL: 16.8 mg/dL (ref 0.0–40.0)

## 2017-02-14 LAB — BASIC METABOLIC PANEL
BUN: 10 mg/dL (ref 6–23)
CHLORIDE: 100 meq/L (ref 96–112)
CO2: 30 meq/L (ref 19–32)
Calcium: 9.9 mg/dL (ref 8.4–10.5)
Creatinine, Ser: 0.56 mg/dL (ref 0.40–1.20)
GFR: 114.08 mL/min (ref >60.00)
GLUCOSE: 83 mg/dL (ref 70–99)
POTASSIUM: 3.9 meq/L (ref 3.5–5.1)
Sodium: 138 mEq/L (ref 135–145)

## 2017-02-14 LAB — TSH: TSH: 1.45 u[IU]/mL (ref 0.35–4.50)

## 2017-02-14 NOTE — Patient Instructions (Addendum)
Check on coverage for new shingles vaccine (Shingrix)-if interested Consider setting up Medicare Wellness visit.

## 2017-02-14 NOTE — Progress Notes (Signed)
Subjective:     Patient ID: Heidi Shah, female   DOB: 1945-09-23, 71 y.o.   MRN: [id number]  HPI Patient seen for physical exam. She has previously gotten Pap smears through GYN and thinks her last one was over 3 years ago. She's had previous shingles vaccine but not shingles. She had mammogram earlier today. Nonsmoker. Exercise with walking. She has history of GERD which is been stable and also history of hypertension. Indications up-to-date. She has no specific complaints today. She takes regular vitamin D that has been intolerant of calcium in the past  Past Medical History:  Diagnosis Date  . Cataract    surgery  . Chicken pox   . Colon polyps   . Depression   . Diverticulitis   . Diverticulosis   . Esophageal stricture   . GERD (gastroesophageal reflux disease)   . Heart murmur   . Hypertension    Past Surgical History:  Procedure Laterality Date  . cataract surgery Bilateral 1996  . HERNIA REPAIR  1990  . NECK SURGERY     as a child    reports that  has never smoked. she has never used smokeless tobacco. She reports that she drinks alcohol. She reports that she does not use drugs. family history includes COPD in her mother; Cancer in her father; Crohn's disease in her mother; Heart disease (age of onset: 39) in her mother; Heart disease (age of onset: 88) in her father. Allergies  Allergen Reactions  . Penicillins      Review of Systems  Constitutional: Negative for activity change, appetite change, fatigue, fever and unexpected weight change.  HENT: Negative for ear pain, hearing loss, sore throat and trouble swallowing.   Eyes: Negative for visual disturbance.  Respiratory: Negative for cough and shortness of breath.   Cardiovascular: Negative for chest pain and palpitations.  Gastrointestinal: Negative for abdominal pain, blood in stool, constipation and diarrhea.  Genitourinary: Negative for dysuria and hematuria.  Musculoskeletal: Negative for  arthralgias, back pain and myalgias.  Skin: Negative for rash.  Neurological: Negative for dizziness, syncope and headaches.  Hematological: Negative for adenopathy.  Psychiatric/Behavioral: Negative for confusion and dysphoric mood.       Objective:   Physical Exam  Constitutional: She is oriented to person, place, and time. She appears well-developed and well-nourished.  HENT:  Head: Normocephalic and atraumatic.  Eyes: EOM are normal. Pupils are equal, round, and reactive to light.  Neck: Normal range of motion. Neck supple. No thyromegaly present.  Cardiovascular: Normal rate, regular rhythm and normal heart sounds.  No murmur heard. Pulmonary/Chest: Breath sounds normal. No respiratory distress. She has no wheezes. She has no rales.  Breasts are symmetric with no mass. No nipple inversion.  Abdominal: Soft. Bowel sounds are normal. She exhibits no distension and no mass. There is no tenderness. There is no rebound and no guarding.  Genitourinary: Vagina normal and uterus normal. No vaginal discharge found.  Genitourinary Comments: Normal external genitalia. Vaginal mucosa appears normal. Cervical os somewhat stenotic. Bimanual normal uterus in size and nontender. No adnexal masses.  Musculoskeletal: Normal range of motion. She exhibits no edema.  Lymphadenopathy:    She has no cervical adenopathy.  Neurological: She is alert and oriented to person, place, and time. She displays normal reflexes. No cranial nerve deficit.  Skin: No rash noted.  Psychiatric: She has a normal mood and affect. Her behavior is normal. Judgment and thought content normal.       Assessment:  Physical exam. Several issues addressed as below    Plan:     -Obtain screening lab work -Set up [employer] annual wellness visit -Pap smear obtained -Patient had mammogram earlier today -Check on coverage for new shingles vaccine -Continue regular weightbearing exercise and vitamin D supplements   she  has been intolerant of calcium supplementation and we have stressed importance of trying to get around 1200 mg daily calcium intake  Eulas Post MD [facility]

## 2017-02-15 LAB — CYTOLOGY - PAP: DIAGNOSIS: NEGATIVE

## 2017-03-03 ENCOUNTER — Other Ambulatory Visit: Payer: Self-pay | Admitting: Internal Medicine

## 2017-03-03 DIAGNOSIS — R1310 Dysphagia, unspecified: Secondary | ICD-10-CM

## 2017-03-03 DIAGNOSIS — K219 Gastro-esophageal reflux disease without esophagitis: Secondary | ICD-10-CM

## 2017-03-03 DIAGNOSIS — K222 Esophageal obstruction: Secondary | ICD-10-CM

## 2017-03-03 NOTE — Progress Notes (Addendum)
Subjective:   Heidi Shah is a 71 y.o. female who presents for an Initial Medicare Annual Wellness Visit.  Describes health as   Diet Eats a lot of fast food;  Grilled or salads Healthy choice frozen meal   Not a big sweet eater   BMI 26   Exercise To begin 3 day a week program   Patient concerns Has IBS;  Had lost weight; IBS help diet; not to use insoluable foods; No potatoes, limited bread, no seeds; drank a lot more water Dr. Henrene Pastor  Will call and make an apt   Cardiac Risk Factors include: family history of premature cardiovascular disease;hypertensionhad issue with caffeine         Objective:    Today's Vitals   03/04/17 0814  BP: 132/88  Pulse: 89  SpO2: 98%  Weight: 129 lb 8 oz (58.7 kg)  Height: 4\' 11"  (1.499 m)   Body mass index is 26.16 kg/m.  Advanced Directives 03/04/2017  Does Patient Have a Medical Advance Directive? No  .  Advanced Directive; Reviewed advanced directive and agreed to receipt of information and discussion.  Focused face to face x  20 minutes discussing HCPOA and Living will and reviewed all the questions in the [facility] forms. The patient voices understanding of HCPOA; LW reviewed and information provided on each question. Educated on how to revoke this HCPOA or LW at any time.   Also  discussed life prolonging measures (given a few examples) and where she could choose to initiate or not;  the ability to given the HCPOA power to change her living will or not if she cannot speak for herself; as well as finalizing the will by 2 unrelated witnesses and notary.  Will call for questions and given information on Heidi Shah pastoral department for further assistance.      Current Medications (verified) Outpatient Encounter Medications as of 03/04/2017  Medication Sig  . ALPRAZolam (XANAX) 0.25 MG tablet Take 1 tablet by mouth twice daily as needed  . aspirin 81 MG tablet Take 81 mg by mouth daily.  . Cholecalciferol (D3  ADULT PO) Take by mouth. daily  . esomeprazole (NEXIUM) 40 MG capsule TAKE ONE CAPSULE BY MOUTH TWO TIMES A DAY AT 8AM AND 10PM  . lisinopril-hydrochlorothiazide (PRINZIDE,ZESTORETIC) 20-12.5 MG tablet TAKE ONE TABLET BY MOUTH ONCE DAILY  . Multiple Vitamin (MULTIVITAMIN) tablet Take 1 tablet by mouth daily.  Marland Kitchen saccharomyces boulardii (FLORASTOR) 250 MG capsule Take 250 mg by mouth 2 (two) times daily.  . vitamin C (ASCORBIC ACID) 500 MG tablet Take 500 mg by mouth daily.  . Misc Natural Products (GINKOGIN PO) Take by mouth.   No facility-administered encounter medications on file as of 03/04/2017.     Allergies (verified) Penicillins   History: Past Medical History:  Diagnosis Date  . Cataract    surgery  . Chicken pox   . Colon polyps   . Depression   . Diverticulitis   . Diverticulosis   . Esophageal stricture   . GERD (gastroesophageal reflux disease)   . Heart murmur   . Hypertension    Past Surgical History:  Procedure Laterality Date  . cataract surgery Bilateral 1996  . HERNIA REPAIR  1990  . NECK SURGERY     as a child   Family History  Problem Relation Age of Onset  . Heart disease Mother 47  . COPD Mother   . Crohn's disease Mother   . Heart disease Father 43  .  Cancer Father        kidney  . Colon cancer Neg Hx   . Esophageal cancer Neg Hx   . Stomach cancer Neg Hx    Social History   Socioeconomic History  . Marital status: Legally Separated    Spouse name: Not on file  . Number of children: 2  . Years of education: Not on file  . Highest education level: Not on file  Social Needs  . Financial resource strain: Not on file  . Food insecurity - worry: Not on file  . Food insecurity - inability: Not on file  . Transportation needs - medical: Not on file  . Transportation needs - non-medical: Not on file  Occupational History  . Occupation: retired Pharmacist, [employer]  Tobacco Use  . Smoking status: Never Smoker  . Smokeless tobacco: Never Used    Substance and Sexual Activity  . Alcohol use: Yes    Alcohol/week: 0.0 oz    Comment: one or two glasses of wine a couple times a week  . Drug use: No  . Sexual activity: Not on file  Other Topics Concern  . Not on file  Social History Narrative  . Not on file    Tobacco Counseling Counseling given: Yes       Activities of Daily Living In your present state of health, do you have any difficulty performing the following activities: 03/04/2017  Hearing? N  Vision? N  Difficulty concentrating or making decisions? N  Walking or climbing stairs? N  Dressing or bathing? N  Doing errands, shopping? N  Preparing Food and eating ? N  Using the Toilet? N  In the past six months, have you accidently leaked urine? (No Data)  Comment frequency  Do you have problems with loss of bowel control? Y  Comment IBS   Managing your Medications? N  Managing your Finances? N  Housekeeping or managing your Housekeeping? N  Some recent data might be hidden    Timed Get Up and Go Performed ; normal   Immunizations and Health Maintenance Immunization History  Administered Date(s) Administered  . Influenza, High Dose Seasonal PF 02/03/2015, 02/13/2016  . Influenza,inj,Quad PF,6+ Mos 12/27/2012, 12/26/2013  . Influenza-Unspecified 01/09/2017  . Pneumococcal Conjugate-13 01/01/2014  . Pneumococcal Polysaccharide-23 12/27/2012  . Td 08/27/2005  . Tdap 02/26/2016   There are no preventive care reminders to display for this patient.  Patient Care Team: Eulas Post, MD as PCP - General (Family Medicine) Dr. Rutherford Guys ophthalmology Dr. Scarlette Shorts; GI      Assessment:   This is a routine wellness examination for Heidi Shah.     Hearing/Vision screen Hearing Screening Comments: Hearing issues;  Vision Screening Comments: Vision checks  Dr. Heather [place]; Annual    Exam in 03/2016 Dr. Gershon Crane  Dietary issues and exercise activities discussed: Exercise limited by: Other - see  comments(biking and waer aerobics )  Goals    . Exercise 150 min/wk Moderate Activity     The beginning year, will start to go 3 days a week For at 60 minutes       Depression Screen PHQ 2/9 Scores 03/04/2017 02/14/2017 02/13/2016 02/10/2015 01/01/2014 09/04/2013  PHQ - 2 Score 0 0 0 0 0 0    Fall Risk Fall Risk  03/04/2017 02/14/2017 02/13/2016 02/10/2015 01/01/2014  Falls in the past year? No No No No No   Currently lives in a home that is one level Good neighbors;   Prevention of falls: Remove  rugs or any tripping hazards in the home Use Non slip mats in bathtubs and showers Placing grab bars next to the toilet and or shower Placing handrails on both sides of the stair way Adding extra lighting in the home.   Personal safety issues reviewed:  1. Consider starting a community watch program per [facility] 2.  Changes batteries is smoke detector and/or carbon monoxide detector  3.  If you have firearms; keep them in a safe place 4.  Wear protection when in the sun; Always wear sunscreen or a hat; It is good to have your doctor check your skin annually or review any new areas of concern 5. Driving safety; Keep in the right lane; stay 3 car lengths behind the car in front of you on the highway; look 3 times prior to pulling out; carry your cell phone everywhere you go!    Cognitive Function: MMSE - Mini Mental State Exam 03/04/2017  Not completed: (No Data)   Ad8 score reviewed for issues:  Issues making decisions:  Less interest in hobbies / activities:  Repeats questions, stories (family complaining):  Trouble using ordinary gadgets (microwave, computer, phone):  Forgets the month or year:   Mismanaging finances:   Remembering appts:  Daily problems with thinking and/or memory: Ad8 score is=0         Screening Tests Health Maintenance  Topic Date Due  . Hepatitis C Screening  02/12/2026 (Originally [birth year])  . PNA vac Low Risk Adult (2 of 2 -  PPSV23) 12/27/2017  . MAMMOGRAM  02/15/2019  . COLONOSCOPY  06/08/2020  . TETANUS/TDAP  02/25/2026  . INFLUENZA VACCINE  Completed  . DEXA SCAN  Completed    Cancer Screenings: Breast: 01/2017 Bone Density /01/2016  -1.9  Educated on osteopenia Pap 01/2017 - Colonoscopy  05/2015  Advanced Directive; will give information     Plan:     PCP Notes  Health Maintenance There are no preventive care reminders to display for this patient. To note; PSV 23 was given one month prior to her Oretha Caprice (one due again 01/2018) Will discuss next year  Discussed AD and given [facility] as needed  Educated regarding the shingrix   Abnormal Screens  Dexa -1.9 Educated regarding osteopenia and given a list of calcium foods  Given the osteoporosis foundation site for education  Referrals  None today  Patient concerns; Now giving it up as she feels it makes her heart race, but she has discussed this with Dr. Elease Hashimoto. Does not note this when she does not drink Tea or other caffeined beverages   Wants to come off her Nexium, but will schedule an apt with Dr. Henrene Pastor to discuss  States she does have some urgency that will be problematic at times. Will make an apt with Dr. Elease Hashimoto after the holidays   Nurse Concerns; As noted  Next PCP apt TBS      I have personally reviewed and noted the following in the patient's chart:   . Medical and social history . Use of alcohol, tobacco or illicit drugs  . Current medications and supplements . Functional ability and status . Nutritional status . Physical activity . Advanced directives . List of other physicians . Hospitalizations, surgeries, and ER visits in previous 12 months . Vitals . Screenings to include cognitive, depression, and falls . Referrals and appointments  In addition, I have reviewed and discussed with patient certain preventive protocols, quality metrics, and best practice recommendations. A written  personalized care plan for preventive services as well as general preventive health recommendations were provided to patient.     VOHYW,VPXTG, RN   03/04/2017    Above reviewed and agree.  Will discuss urine urgency issues at follow up.  Eulas Post MD [facility]

## 2017-03-03 NOTE — Progress Notes (Signed)
  PCP Notes   Health Maintenance   Abnormal Screens    Referrals    Patient Concerns   Nurse Concerns;   Next PCP apt

## 2017-03-04 ENCOUNTER — Ambulatory Visit (INDEPENDENT_AMBULATORY_CARE_PROVIDER_SITE_OTHER): Payer: Medicare PPO

## 2017-03-04 VITALS — BP 132/88 | HR 89 | Ht 59.0 in | Wt 129.5 lb

## 2017-03-04 DIAGNOSIS — Z0000 Encounter for general adult medical examination without abnormal findings: Secondary | ICD-10-CM

## 2017-03-04 NOTE — Patient Instructions (Addendum)
Heidi Shah , Thank you for taking time to come for your Medicare Wellness Visit. I appreciate your ongoing commitment to your health goals. Please review the following plan we discussed and let me know if I can assist you in the future.   Just mentioned the fodmap diet on line for IBS just for general information  Will make an apt with Dr. Samara Deist is a vaccine for the prevention of Shingles in Adults 61 and older.  If you are on Medicare, you can request a prescription from your doctor to be filled at a pharmacy.  Please check with your benefits regarding applicable copays or out of pocket expenses.  The Shingrix is given in 2 vaccines approx 8 weeks apart. You must receive the 2nd dose prior to 6 months from receipt of the first.    You can make an apt with Dr. Elease Hashimoto to discuss urinary urgency   Can review he HCPOA when ready Will try to complete AD; Given copy  Referred to [facility] for questions [employer] offers free advance directive forms, as well as assistance in completing the forms themselves. For assistance, contact the Spiritual Care Department at [phone], or the Clinical Social Work Department at [phone].   Recommendations for Dexa Scan Female over the age of 79 Man age 51 or older If you broke a bone past the age of 4 Women menopausal age with risk factors (thin frame; smoker; hx of fx ) Post menopausal women under the age of 58 with risk factors A man age 46 to 105 with risk factors Other: Spine xray that is showing break of bone loss Back pain with possible break Height loss of 1/2 inch or more within one year Total loss in height of 1.5 inches from your original height  Calcium 1223m with Vit D 800u per day; more as directed by physician Strength building exercises discussed; can include walking; housework; small weights or stretch bands; silver sneakers if access to the Y  Please visit the osteoporosis foundation.org for up to date  recommendations  [facility] [facility] [place]- can assist with hearing aid x 1  No reviews  [employer]  [phone]  ([phone] http://[email or web address]-[email or web address]_How_to_Buy_Hearing_Aids.pdf     These are the goals we discussed: Goals    . Exercise 150 min/wk Moderate Activity     The beginning year, will start to go 3 days a week For at 60 minutes        This is a list of the screening recommended for you and due dates:  Health Maintenance  Topic Date Due  .  Hepatitis C: One time screening is recommended by Center for Disease Control  (CDC) for  adults born from [birth year]through 1963.   02/12/2026*  . Pneumonia vaccines (2 of 2 - PPSV23) 12/27/2017  . Mammogram  02/15/2019  . Colon Cancer Screening  06/08/2020  . Tetanus Vaccine  02/25/2026  . Flu Shot  Completed  . DEXA scan (bone density measurement)  Completed  *Topic was postponed. The date shown is not the original due date.      Fall Prevention in the Home Falls can cause injuries. They can happen to people of all ages. There are many things you can do to make your home safe and to help prevent falls. What can I do on the outside of my home?  Regularly fix the edges of walkways and driveways and fix any cracks.  Remove anything that might make you trip as you walk through a door, such as a raised step or threshold.  Trim any bushes or trees on the path to your home.  Use bright outdoor lighting.  Clear any walking paths of anything that might make someone trip, such as rocks or tools.  Regularly check to see if handrails are loose or broken. Make sure that both sides of any steps have handrails.  Any raised decks and porches should have guardrails on the edges.  Have any leaves, snow, or ice cleared regularly.  Use sand or salt on walking paths during winter.  Clean up any spills in your garage right away. This includes  oil or grease spills. What can I do in the bathroom?  Use night lights.  Install grab bars by the toilet and in the tub and shower. Do not use towel bars as grab bars.  Use non-skid mats or decals in the tub or shower.  If you need to sit down in the shower, use a plastic, non-slip stool.  Keep the floor dry. Clean up any water that spills on the floor as soon as it happens.  Remove soap buildup in the tub or shower regularly.  Attach bath mats securely with double-sided non-slip rug tape.  Do not have throw rugs and other things on the floor that can make you trip. What can I do in the bedroom?  Use night lights.  Make sure that you have a light by your bed that is easy to reach.  Do not use any sheets or blankets that are too big for your bed. They should not hang down onto the floor.  Have a firm chair that has side arms. You can use this for support while you get dressed.  Do not have throw rugs and other things on the floor that can make you trip. What can I do in the kitchen?  Clean up any spills right away.  Avoid walking on wet floors.  Keep items that you use a lot in easy-to-reach places.  If you need to reach something above you, use a strong step stool that has a grab bar.  Keep electrical cords out of the way.  Do not use floor polish or wax that makes floors slippery. If you must use wax, use non-skid floor wax.  Do not have throw rugs and other things on the floor that can make you trip. What can I do with my stairs?  Do not leave any items on the stairs.  Make sure that there are handrails on both sides of the stairs and use them. Fix handrails that are broken or loose. Make sure that handrails are as long as the stairways.  Check any carpeting to make sure that it is firmly attached to the stairs. Fix any carpet that is loose or worn.  Avoid having throw rugs at the top or bottom of the stairs. If you do have throw rugs, attach them to the floor  with carpet tape.  Make sure that you have a light switch at the top of the stairs and the bottom of the stairs. If you do not have them, ask someone to add them for you. What else can I do to help prevent falls?  Wear shoes that: ? Do not have high heels. ? Have rubber bottoms. ? Are comfortable and fit you well. ? Are closed at the toe. Do not wear sandals.  If you use a stepladder: ?  Make sure that it is fully opened. Do not climb a closed stepladder. ? Make sure that both sides of the stepladder are locked into place. ? Ask someone to hold it for you, if possible.  Clearly mark and make sure that you can see: ? Any grab bars or handrails. ? First and last steps. ? Where the edge of each step is.  Use tools that help you move around (mobility aids) if they are needed. These include: ? Canes. ? Walkers. ? Scooters. ? Crutches.  Turn on the lights when you go into a dark area. Replace any light bulbs as soon as they burn out.  Set up your furniture so you have a clear path. Avoid moving your furniture around.  If any of your floors are uneven, fix them.  If there are any pets around you, be aware of where they are.  Review your medicines with your doctor. Some medicines can make you feel dizzy. This can increase your chance of falling. Ask your doctor what other things that you can do to help prevent falls. This information is not intended to replace advice given to you by your health care provider. Make sure you discuss any questions you have with your health care provider. Document Released: 01/09/2009 Document Revised: 08/21/2015 Document Reviewed: 04/19/2014 Elsevier Interactive Patient Education  2018 [place] Maintenance, Female Adopting a healthy lifestyle and getting preventive care can go a long way to promote health and wellness. Talk with your health care provider about what schedule of regular examinations is right for you. This is a good chance  for you to check in with your provider about disease prevention and staying healthy. In between checkups, there are plenty of things you can do on your own. Experts have done a lot of research about which lifestyle changes and preventive measures are most likely to keep you healthy. Ask your health care provider for more information. Weight and diet Eat a healthy diet  Be sure to include plenty of vegetables, fruits, low-fat dairy products, and lean protein.  Do not eat a lot of foods high in solid fats, added sugars, or salt.  Get regular exercise. This is one of the most important things you can do for your health. ? Most adults should exercise for at least 150 minutes each week. The exercise should increase your heart rate and make you sweat (moderate-intensity exercise). ? Most adults should also do strengthening exercises at least twice a week. This is in addition to the moderate-intensity exercise.  Maintain a healthy weight  Body mass index (BMI) is a measurement that can be used to identify possible weight problems. It estimates body fat based on height and weight. Your health care provider can help determine your BMI and help you achieve or maintain a healthy weight.  For females 69 years of age and older: ? A BMI below 18.5 is considered underweight. ? A BMI of 18.5 to 24.9 is normal. ? A BMI of 25 to 29.9 is considered overweight. ? A BMI of 30 and above is considered obese.  Watch levels of cholesterol and blood lipids  You should start having your blood tested for lipids and cholesterol at 69 years of age, then have this test every 5 years.  You may need to have your cholesterol levels checked more often if: ? Your lipid or cholesterol levels are high. ? You are older than 69 years of age. ? You are at high risk for heart  disease.  Cancer screening Lung Cancer  Lung cancer screening is recommended for adults 57-3 years old who are at high risk for lung cancer because  of a history of smoking.  A yearly low-dose CT scan of the lungs is recommended for people who: ? Currently smoke. ? Have quit within the past 15 years. ? Have at least a 30-pack-year history of smoking. A pack year is smoking an average of one pack of cigarettes a day for 1 year.  Yearly screening should continue until it has been 15 years since you quit.  Yearly screening should stop if you develop a health problem that would prevent you from having lung cancer treatment.  Breast Cancer  Practice breast self-awareness. This means understanding how your breasts normally appear and feel.  It also means doing regular breast self-exams. Let your health care provider know about any changes, no matter how small.  If you are in your 20s or 30s, you should have a clinical breast exam (CBE) by a health care provider every 1-3 years as part of a regular health exam.  If you are 66 or older, have a CBE every year. Also consider having a breast X-ray (mammogram) every year.  If you have a family history of breast cancer, talk to your health care provider about genetic screening.  If you are at high risk for breast cancer, talk to your health care provider about having an MRI and a mammogram every year.  Breast cancer gene (BRCA) assessment is recommended for women who have family members with BRCA-related cancers. BRCA-related cancers include: ? Breast. ? Ovarian. ? Tubal. ? Peritoneal cancers.  Results of the assessment will determine the need for genetic counseling and BRCA1 and BRCA2 testing.  Cervical Cancer Your health care provider may recommend that you be screened regularly for cancer of the pelvic organs (ovaries, uterus, and vagina). This screening involves a pelvic examination, including checking for microscopic changes to the surface of your cervix (Pap test). You may be encouraged to have this screening done every 3 years, beginning at age 43.  For women ages 51-65, health care  providers may recommend pelvic exams and Pap testing every 3 years, or they may recommend the Pap and pelvic exam, combined with testing for human papilloma virus (HPV), every 5 years. Some types of HPV increase your risk of cervical cancer. Testing for HPV may also be done on women of any age with unclear Pap test results.  Other health care providers may not recommend any screening for nonpregnant women who are considered low risk for pelvic cancer and who do not have symptoms. Ask your health care provider if a screening pelvic exam is right for you.  If you have had past treatment for cervical cancer or a condition that could lead to cancer, you need Pap tests and screening for cancer for at least 20 years after your treatment. If Pap tests have been discontinued, your risk factors (such as having a new sexual partner) need to be reassessed to determine if screening should resume. Some women have medical problems that increase the chance of getting cervical cancer. In these cases, your health care provider may recommend more frequent screening and Pap tests.  Colorectal Cancer  This type of cancer can be detected and often prevented.  Routine colorectal cancer screening usually begins at 69 years of age and continues through 69 years of age.  Your health care provider may recommend screening at an earlier age if you have  risk factors for colon cancer.  Your health care provider may also recommend using home test kits to check for hidden blood in the stool.  A small camera at the end of a tube can be used to examine your colon directly (sigmoidoscopy or colonoscopy). This is done to check for the earliest forms of colorectal cancer.  Routine screening usually begins at age 79.  Direct examination of the colon should be repeated every 5-10 years through 69 years of age. However, you may need to be screened more often if early forms of precancerous polyps or small growths are found.  Skin  Cancer  Check your skin from head to toe regularly.  Tell your health care provider about any new moles or changes in moles, especially if there is a change in a mole's shape or color.  Also tell your health care provider if you have a mole that is larger than the size of a pencil eraser.  Always use sunscreen. Apply sunscreen liberally and repeatedly throughout the day.  Protect yourself by wearing long sleeves, pants, a wide-brimmed hat, and sunglasses whenever you are outside.  Heart disease, diabetes, and high blood pressure  High blood pressure causes heart disease and increases the risk of stroke. High blood pressure is more likely to develop in: ? People who have blood pressure in the high end of the normal range (130-139/85-89 mm Hg). ? People who are overweight or obese. ? People who are African American.  If you are 78-50 years of age, have your blood pressure checked every 3-5 years. If you are 55 years of age or older, have your blood pressure checked every year. You should have your blood pressure measured twice-once when you are at a hospital or clinic, and once when you are not at a hospital or clinic. Record the average of the two measurements. To check your blood pressure when you are not at a hospital or clinic, you can use: ? An automated blood pressure machine at a pharmacy. ? A home blood pressure monitor.  If you are between 58 years and 33 years old, ask your health care provider if you should take aspirin to prevent strokes.  Have regular diabetes screenings. This involves taking a blood sample to check your fasting blood sugar level. ? If you are at a normal weight and have a low risk for diabetes, have this test once every three years after 69 years of age. ? If you are overweight and have a high risk for diabetes, consider being tested at a younger age or more often. Preventing infection Hepatitis B  If you have a higher risk for hepatitis B, you should be  screened for this virus. You are considered at high risk for hepatitis B if: ? You were born in a country where hepatitis B is common. Ask your health care provider which countries are considered high risk. ? Your parents were born in a high-risk country, and you have not been immunized against hepatitis B (hepatitis B vaccine). ? You have HIV or AIDS. ? You use needles to inject street drugs. ? You live with someone who has hepatitis B. ? You have had sex with someone who has hepatitis B. ? You get hemodialysis treatment. ? You take certain medicines for conditions, including cancer, organ transplantation, and autoimmune conditions.  Hepatitis C  Blood testing is recommended for: ? Everyone born from [birth year] through 1963. ? Anyone with known risk factors for hepatitis C.  Sexually transmitted infections (  STIs)  You should be screened for sexually transmitted infections (STIs) including gonorrhea and chlamydia if: ? You are sexually active and are younger than 69 years of age. ? You are older than 69 years of age and your health care provider tells you that you are at risk for this type of infection. ? Your sexual activity has changed since you were last screened and you are at an increased risk for chlamydia or gonorrhea. Ask your health care provider if you are at risk.  If you do not have HIV, but are at risk, it may be recommended that you take a prescription medicine daily to prevent HIV infection. This is called pre-exposure prophylaxis (PrEP). You are considered at risk if: ? You are sexually active and do not regularly use condoms or know the HIV status of your partner(s). ? You take drugs by injection. ? You are sexually active with a partner who has HIV.  Talk with your health care provider about whether you are at high risk of being infected with HIV. If you choose to begin PrEP, you should first be tested for HIV. You should then be tested every 3 months for as long as you are  taking PrEP. Pregnancy  If you are premenopausal and you may become pregnant, ask your health care provider about preconception counseling.  If you may become pregnant, take 400 to 800 micrograms (mcg) of folic acid every day.  If you want to prevent pregnancy, talk to your health care provider about birth control (contraception). Osteoporosis and menopause  Osteoporosis is a disease in which the bones lose minerals and strength with aging. This can result in serious bone fractures. Your risk for osteoporosis can be identified using a bone density scan.  If you are 14 years of age or older, or if you are at risk for osteoporosis and fractures, ask your health care provider if you should be screened.  Ask your health care provider whether you should take a calcium or vitamin D supplement to lower your risk for osteoporosis.  Menopause may have certain physical symptoms and risks.  Hormone replacement therapy may reduce some of these symptoms and risks. Talk to your health care provider about whether hormone replacement therapy is right for you. Follow these instructions at home:  Schedule regular health, dental, and eye exams.  Stay current with your immunizations.  Do not use any tobacco products including cigarettes, chewing tobacco, or electronic cigarettes.  If you are pregnant, do not drink alcohol.  If you are breastfeeding, limit how much and how often you drink alcohol.  Limit alcohol intake to no more than 1 drink per day for nonpregnant women. One drink equals 12 ounces of beer, 5 ounces of wine, or 1 ounces of hard liquor.  Do not use street drugs.  Do not share needles.  Ask your health care provider for help if you need support or information about quitting drugs.  Tell your health care provider if you often feel depressed.  Tell your health care provider if you have ever been abused or do not feel safe at home. This information is not intended to replace  advice given to you by your health care provider. Make sure you discuss any questions you have with your health care provider. Document Released: 09/28/2010 Document Revised: 08/21/2015 Document Reviewed: 12/17/2014 Elsevier Interactive Patient Education  [employer].

## 2017-03-12 ENCOUNTER — Other Ambulatory Visit: Payer: Self-pay | Admitting: Internal Medicine

## 2017-03-12 DIAGNOSIS — K222 Esophageal obstruction: Secondary | ICD-10-CM

## 2017-03-12 DIAGNOSIS — K219 Gastro-esophageal reflux disease without esophagitis: Secondary | ICD-10-CM

## 2017-03-12 DIAGNOSIS — R1310 Dysphagia, unspecified: Secondary | ICD-10-CM

## 2017-03-25 ENCOUNTER — Telehealth: Payer: Self-pay | Admitting: Internal Medicine

## 2017-03-25 DIAGNOSIS — K219 Gastro-esophageal reflux disease without esophagitis: Secondary | ICD-10-CM

## 2017-03-25 DIAGNOSIS — K222 Esophageal obstruction: Secondary | ICD-10-CM

## 2017-03-25 DIAGNOSIS — R1310 Dysphagia, unspecified: Secondary | ICD-10-CM

## 2017-03-25 NOTE — Telephone Encounter (Signed)
Patient states she needs medication nexium refilled at [facility]. Pt sch for follow up on 2.7.19

## 2017-03-28 MED ORDER — ESOMEPRAZOLE MAGNESIUM 40 MG PO CPDR: DELAYED_RELEASE_CAPSULE | ORAL | 2 refills | Status: DC

## 2017-03-28 NOTE — Telephone Encounter (Signed)
Nexium refilled 

## 2017-05-05 ENCOUNTER — Ambulatory Visit: Payer: BC Managed Care – PPO | Admitting: Internal Medicine

## 2017-05-06 ENCOUNTER — Ambulatory Visit (INDEPENDENT_AMBULATORY_CARE_PROVIDER_SITE_OTHER): Payer: Medicare PPO | Admitting: Internal Medicine

## 2017-05-06 ENCOUNTER — Encounter: Payer: Self-pay | Admitting: Internal Medicine

## 2017-05-06 ENCOUNTER — Encounter (INDEPENDENT_AMBULATORY_CARE_PROVIDER_SITE_OTHER): Payer: Self-pay

## 2017-05-06 VITALS — BP 120/86 | HR 100 | Ht 59.0 in | Wt 130.0 lb

## 2017-05-06 DIAGNOSIS — R1310 Dysphagia, unspecified: Secondary | ICD-10-CM | POA: Diagnosis not present

## 2017-05-06 DIAGNOSIS — K219 Gastro-esophageal reflux disease without esophagitis: Secondary | ICD-10-CM

## 2017-05-06 DIAGNOSIS — K222 Esophageal obstruction: Secondary | ICD-10-CM

## 2017-05-06 NOTE — Progress Notes (Signed)
HISTORY OF PRESENT ILLNESS:  Heidi Shah is a 72 y.o. female with history of GERD, Cleophus Molt by peptic stricture who presents today with complaints of recurrent dysphagia. The patient underwent upper endoscopy 06/09/2015 to evaluate reflux and dysphagia. She was found to have erosive esophagitis and a distal esophageal stricture as well as a 4 cm hiatal hernia. She was prescribed Nexium 40 mg twice daily in addition to esophageal dilation with 54 French Maloney dilator. Follow-up in the office 08/18/2015 found that the patient was doing well on once daily Nexium. Specifically no active reflux symptoms or recurrent dysphagia. She stayed on her medication until last summer when she "wean herself off". After being off medication for one month she did notice recurrent reflux symptoms which she was treating with Tums. Also recurrent intermittent solid food dysphagia which culminated in a 12 hour self-limited food impaction around Christmas. She contacted this office was placed on Nexium 40 mg twice daily. Her reflux symptoms have resolved. Dysphagia has significantly improved though not completely.. Review of outside blood work from 02/14/2017 findings normal comprehensive metabolic panel and CBC.  REVIEW OF SYSTEMS:  All non-GI ROS negative except for back pain, cough, excessive urination  Past Medical History:  Diagnosis Date  . Cataract    surgery  . Chicken pox   . Colon polyps   . Depression   . Diverticulitis   . Diverticulosis   . Esophageal stricture   . GERD (gastroesophageal reflux disease)   . Heart murmur   . Hypertension     Past Surgical History:  Procedure Laterality Date  . cataract surgery Bilateral 1996  . HERNIA REPAIR  1990  . NECK SURGERY     as a child    Social History Heidi Shah  reports that  has never smoked. she has never used smokeless tobacco. She reports that she drinks alcohol. She reports that she does not use drugs.  family history includes  COPD in her mother; Cancer in her father; Crohn's disease in her mother; Heart disease (age of onset: 67) in her mother; Heart disease (age of onset: 30) in her father.  Allergies  Allergen Reactions  . Penicillins        PHYSICAL EXAMINATION: Vital signs: BP 120/86   Pulse 100   Ht 4\' 11"  (1.499 m)   Wt 130 lb (59 kg)   BMI 26.26 kg/m   Constitutional: generally well-appearing, no acute distress Psychiatric: alert and oriented x3, cooperative Eyes: extraocular movements intact, anicteric, conjunctiva pink Mouth: oral pharynx moist, no lesions Neck: supple no lymphadenopathy Cardiovascular: heart regular rate and rhythm, no murmur Lungs: clear to auscultation bilaterally Abdomen: soft, nontender, nondistended, no obvious ascites, no peritoneal signs, normal bowel sounds, no organomegaly Rectal: Omitted Extremities: no clubbing, cyanosis, or lower extremity edema bilaterally Skin: no lesions on visible extremities Neuro: No focal deficits. Cranial nerves intact  ASSESSMENT:  #1. GERD complicated by erosive esophagitis and peptic stricture. The patient had recurrent symptoms of reflux as well as significant dysphagia off PPI. #2. Colonoscopy March 2017 with sessile serrated polyp  PLAN:  #1. Reflux precautions #2. Continue Nexium 40 mg twice daily. May be able to reduce to once daily post endoscopy. #3. Upper endoscopy with esophageal dilation.The nature of the procedure, as well as the risks, benefits, and alternatives were carefully and thoroughly reviewed with the patient. Ample time for discussion and questions allowed. The patient understood, was satisfied, and agreed to proceed. #4. Surveillance colonoscopy around March 2022

## 2017-05-06 NOTE — Patient Instructions (Signed)

## 2017-05-16 ENCOUNTER — Telehealth: Payer: Self-pay | Admitting: Internal Medicine

## 2017-05-16 NOTE — Telephone Encounter (Signed)
Pt aware and appt cancelled. Pt knows to call back to reschedule when recovered.
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
## 2017-05-16 NOTE — Telephone Encounter (Signed)
Just to be safe, she should cancel and reschedule after she has completely recovered. Please remove from [place] slot. Thank you

## 2017-05-26 ENCOUNTER — Encounter: Payer: BC Managed Care – PPO | Admitting: Internal Medicine

## 2017-07-28 ENCOUNTER — Other Ambulatory Visit: Payer: Self-pay

## 2017-07-28 ENCOUNTER — Encounter: Payer: Self-pay | Admitting: Internal Medicine

## 2017-07-28 ENCOUNTER — Ambulatory Visit (AMBULATORY_SURGERY_CENTER): Payer: Medicare PPO | Admitting: Internal Medicine

## 2017-07-28 VITALS — BP 132/72 | HR 80 | Temp 99.1°F | Resp 16 | Ht 59.0 in | Wt 130.0 lb

## 2017-07-28 DIAGNOSIS — K222 Esophageal obstruction: Secondary | ICD-10-CM

## 2017-07-28 DIAGNOSIS — K219 Gastro-esophageal reflux disease without esophagitis: Secondary | ICD-10-CM | POA: Diagnosis not present

## 2017-07-28 DIAGNOSIS — R1310 Dysphagia, unspecified: Secondary | ICD-10-CM

## 2017-07-28 MED ORDER — SODIUM CHLORIDE 0.9 % IV SOLN: 500.0000 mL | Freq: Once | INTRAVENOUS | Status: DC

## 2017-07-28 NOTE — Progress Notes (Signed)
I have reviewed the patient's medical history in detail and updated the computerized patient record.

## 2017-07-28 NOTE — Progress Notes (Signed)
Report to PACU, RN, vss, BBS= Clear.  

## 2017-07-28 NOTE — Progress Notes (Signed)
Called to room to assist during endoscopic procedure.  Patient ID and intended procedure confirmed with present staff. Received instructions for my participation in the procedure from the performing physician.  

## 2017-07-28 NOTE — Patient Instructions (Signed)
YOU HAD AN ENDOSCOPIC PROCEDURE TODAY AT [facility]:   Refer to the procedure report that was given to you for any specific questions about what was found during the examination.  If the procedure report does not answer your questions, please call your gastroenterologist to clarify.  If you requested that your care partner not be given the details of your procedure findings, then the procedure report has been included in a sealed envelope for you to review at your convenience later.  YOU SHOULD EXPECT: Some feelings of bloating in the abdomen. Passage of more gas than usual.  Walking can help get rid of the air that was put into your GI tract during the procedure and reduce the bloating.   Please Note:  You might notice some irritation and congestion in your nose or some drainage.  This is from the oxygen used during your procedure.  There is no need for concern and it should clear up in a day or so.  SYMPTOMS TO REPORT IMMEDIATELY:    Following upper endoscopy (EGD)  Vomiting of blood or coffee ground material  New chest pain or pain under the shoulder blades  Painful or persistently difficult swallowing  New shortness of breath  Fever of 100F or higher  Black, tarry-looking stools  For urgent or emergent issues, a gastroenterologist can be reached at any hour by calling [phone].   DIET:  We recommend clear liquids beginning at 5 pm. If tolerated, you can proceed to a soft diet for the rest of today. You can have a regular diet tomorrow.  Drink plenty of fluids but you should avoid alcoholic beverages for 24 hours.  ACTIVITY:  You should plan to take it easy for the rest of today and you should NOT DRIVE or use heavy machinery until tomorrow (because of the sedation medicines used during the test).    FOLLOW UP: Our staff will call the number listed on your records the next business day following your procedure to check on you and address any questions or concerns  that you may have regarding the information given to you following your procedure. If we do not reach you, we will leave a message.  However, if you are feeling well and you are not experiencing any problems, there is no need to return our call.  We will assume that you have returned to your regular daily activities without incident.  If any biopsies were taken you will be contacted by phone or by letter within the next 1-3 weeks.  Please call us at [phone] if you have not heard about the biopsies in 3 weeks.    SIGNATURES/CONFIDENTIALITY: You and/or your care partner have signed paperwork which will be entered into your electronic medical record.  These signatures attest to the fact that that the information above on your After Visit Summary has been reviewed and is understood.  Full responsibility of the confidentiality of this discharge information lies with you and/or your care-partner.  Decrease Nexium to 40 mg/ day

## 2017-07-28 NOTE — Op Note (Signed)
[facility] Patient Name: Heidi Shah Procedure Date: 07/28/2017 3:21 PM MRN: [id number] Endoscopist: Docia Chuck. Henrene Pastor , MD Age: 72 Referring MD:  Date of Birth: 1945/06/08 Gender: Female Account #: [id number] Procedure:                Upper GI endoscopy, With [facility] dilation of the                            esophagus?"83 F Indications:              Dysphagia, Esophageal reflux Medicines:                Monitored Anesthesia Care Procedure:                Pre-Anesthesia Assessment:                           - Prior to the procedure, a History and Physical                            was performed, and patient medications and                            allergies were reviewed. The patient's tolerance of                            previous anesthesia was also reviewed. The risks                            and benefits of the procedure and the sedation                            options and risks were discussed with the patient.                            All questions were answered, and informed consent                            was obtained. Prior Anticoagulants: The patient has                            taken no previous anticoagulant or antiplatelet                            agents. ASA Grade Assessment: II - A patient with                            mild systemic disease. After reviewing the risks                            and benefits, the patient was deemed in                            satisfactory condition to undergo the procedure.  After obtaining informed consent, the endoscope was                            passed under direct vision. Throughout the                            procedure, the patient's blood pressure, pulse, and                            oxygen saturations were monitored continuously. The                            Endoscope was introduced through the mouth, and                            advanced to the second part  of duodenum. The upper                            GI endoscopy was accomplished without difficulty.                            The patient tolerated the procedure well. Scope In: Scope Out: Findings:                 One benign-appearing, intrinsic moderate stenosis                            was found 35 cm from the incisors. This stenosis                            measured 1.5 cm (inner diameter). The scope was                            withdrawn After completing the endoscopic survey.                            Dilation was performed with a Maloney dilator with                            no resistance at 89 Fr.                           The exam of the esophagus was otherwise normal.                           The stomach was normal, Save 3-4 mm hiatal hernia.                           The examined duodenum was normal.                           The cardia and gastric fundus were normal on  retroflexion. Complications:            No immediate complications. Estimated Blood Loss:     Estimated blood loss: none. Impression:               - Benign-appearing esophageal stenosis. Dilated.                           - Normal stomach with Hiatal hernia.                           - Normal examined duodenum.                           - No specimens collected. Recommendation:           - Patient has a contact number available for                            emergencies. The signs and symptoms of potential                            delayed complications were discussed with the                            patient. Return to normal activities tomorrow.                            Written discharge instructions were provided to the                            patient.                           - Post dilation diet.                           - Continue Nexium daily. May decrease Nexium to 40                            mg once daily                           - Office follow-up  with Dr. Henrene Pastor in about 3                            months.Docia Chuck Henrene Pastor, MD 07/28/2017 3:39:15 PM This report has been signed electronically.

## 2017-07-29 ENCOUNTER — Telehealth: Payer: Self-pay | Admitting: *Deleted

## 2017-07-29 NOTE — Telephone Encounter (Signed)
  Follow up Call-  Call back number 07/28/2017 06/09/2015  Post procedure Call Back phone  # [phone]  Permission to leave phone message Yes Yes  Some recent data might be hidden     Patient questions:  Do you have a fever, pain , or abdominal swelling? No. Pain Score  0 *  Have you tolerated food without any problems? Yes.    Have you been able to return to your normal activities? Yes.    Do you have any questions about your discharge instructions: Diet   No. Medications  No. Follow up visit  No.  Do you have questions or concerns about your Care? No.  Actions: * If pain score is 4 or above: No action needed, pain <4.

## 2017-08-11 ENCOUNTER — Other Ambulatory Visit: Payer: Self-pay | Admitting: Family Medicine

## 2017-08-11 NOTE — Telephone Encounter (Signed)
Rx done. 

## 2017-08-11 NOTE — Telephone Encounter (Signed)
Last refill given on 11/26/2015 for #30 with no refills

## 2017-08-11 NOTE — Telephone Encounter (Signed)
Refill once 

## 2017-08-15 ENCOUNTER — Other Ambulatory Visit: Payer: Self-pay | Admitting: Internal Medicine

## 2017-08-15 DIAGNOSIS — K219 Gastro-esophageal reflux disease without esophagitis: Secondary | ICD-10-CM

## 2017-08-15 DIAGNOSIS — R1310 Dysphagia, unspecified: Secondary | ICD-10-CM

## 2017-08-15 DIAGNOSIS — K222 Esophageal obstruction: Secondary | ICD-10-CM

## 2017-08-30 ENCOUNTER — Ambulatory Visit (INDEPENDENT_AMBULATORY_CARE_PROVIDER_SITE_OTHER): Payer: Medicare PPO | Admitting: Family Medicine

## 2017-08-30 ENCOUNTER — Encounter: Payer: Self-pay | Admitting: Family Medicine

## 2017-08-30 VITALS — BP 110/80 | HR 91 | Temp 98.2°F | Wt 121.9 lb

## 2017-08-30 DIAGNOSIS — R21 Rash and other nonspecific skin eruption: Secondary | ICD-10-CM | POA: Diagnosis not present

## 2017-08-30 MED ORDER — TRIAMCINOLONE ACETONIDE 0.1 % EX CREA: 1.0000 "application " | TOPICAL_CREAM | Freq: Two times a day (BID) | CUTANEOUS | 1 refills | Status: DC

## 2017-08-30 NOTE — Patient Instructions (Signed)
Leave off Polysporin - or any other topical antibiotics  Touch base in 2 weeks if rash not cleared.

## 2017-08-30 NOTE — Progress Notes (Signed)
  Subjective:     Patient ID: Heidi Shah, female   DOB: December 12, 1945, 72 y.o.   MRN: [id number]  HPI Patient seen with 2 day history of left breast rash. Rash is just above the nipple area. Nonscaly. No drainage. No associated pain. No nipple discharge. Patient had mammogram November 2018 which was normal. No fevers or chills. Patient applied some Polysporin without improvement  Past Medical History:  Diagnosis Date  . Cataract    surgery  . Chicken pox   . Colon polyps   . Depression   . Diverticulitis   . Diverticulosis   . Esophageal stricture   . GERD (gastroesophageal reflux disease)   . Heart murmur   . Hypertension    Past Surgical History:  Procedure Laterality Date  . cataract surgery Bilateral 1996  . HERNIA REPAIR  1990  . NECK SURGERY     as a child    reports that she has never smoked. She has never used smokeless tobacco. She reports that she drinks alcohol. She reports that she does not use drugs. family history includes COPD in her mother; Cancer in her father; Crohn's disease in her mother; Heart disease (age of onset: 72) in her mother; Heart disease (age of onset: 76) in her father. Allergies  Allergen Reactions  . Penicillins      Review of Systems  Constitutional: Negative for chills and fever.  Skin: Positive for rash.       Objective:   Physical Exam  Constitutional: She appears well-developed and well-nourished.  Cardiovascular: Normal rate and regular rhythm.  Skin: Rash noted.  Patient has area of rash which is erythematous macular nonscaly nontender involving the left areola region.  This is only a very small rim of erythema about 1-2 mm diameter. No nipple inversion.       Assessment:     Skin rash confined to the left ureteral region. Non-scaly.    Plan:     -Leave off Polysporin -Recommend trial of triamcinolone 0.1% cream twice daily and touch base if rash not fully resolved in 1-2 weeks  Eulas Post MD [place]  [facility] at [facility]

## 2017-09-21 ENCOUNTER — Ambulatory Visit (INDEPENDENT_AMBULATORY_CARE_PROVIDER_SITE_OTHER): Payer: Medicare PPO | Admitting: Family Medicine

## 2017-09-21 ENCOUNTER — Encounter: Payer: Self-pay | Admitting: Family Medicine

## 2017-09-21 VITALS — BP 120/84 | HR 104 | Temp 98.8°F | Wt 128.2 lb

## 2017-09-21 DIAGNOSIS — R21 Rash and other nonspecific skin eruption: Secondary | ICD-10-CM | POA: Diagnosis not present

## 2017-09-21 NOTE — Progress Notes (Signed)
  Subjective:     Patient ID: Heidi Shah, female   DOB: 01-21-1946, 72 y.o.   MRN: [id number]  HPI Patient seen with rash left breast region. Pruritic, eczematous type rash. Started triamcinolone and this did clear some with triamcinolone but when she stopped the cream rash comes back. No nipple discharge. No associated pain. No other rash noted.  Past Medical History:  Diagnosis Date  . Cataract    surgery  . Chicken pox   . Colon polyps   . Depression   . Diverticulitis   . Diverticulosis   . Esophageal stricture   . GERD (gastroesophageal reflux disease)   . Heart murmur   . Hypertension    Past Surgical History:  Procedure Laterality Date  . cataract surgery Bilateral 1996  . HERNIA REPAIR  1990  . NECK SURGERY     as a child    reports that she has never smoked. She has never used smokeless tobacco. She reports that she drinks alcohol. She reports that she does not use drugs. family history includes COPD in her mother; Cancer in her father; Crohn's disease in her mother; Heart disease (age of onset: 72) in her mother; Heart disease (age of onset: 41) in her father. Allergies  Allergen Reactions  . Penicillins      Review of Systems  Constitutional: Negative for appetite change and unexpected weight change.  Skin: Positive for rash.       Objective:   Physical Exam  Constitutional: She appears well-developed and well-nourished.  Cardiovascular: Normal rate and regular rhythm.  Skin:  Eczema rash from last visit resolved.  She has a few very faint telangiectasia of skin left lateral breast.  No scaly rash.  No cellulitis changes.       Assessment:     Eczema rash left breast- improved    Plan:     -continue with triamcinolone as needed- avoid regular use..  Follow up as needed.  Eulas Post MD [facility]

## 2017-09-21 NOTE — Patient Instructions (Signed)
Follow up for any recurrent rash.   Use the steroid cream only as needed for recurrent scaly rash.

## 2017-10-18 ENCOUNTER — Encounter: Payer: Self-pay | Admitting: Family Medicine

## 2017-10-28 ENCOUNTER — Ambulatory Visit (INDEPENDENT_AMBULATORY_CARE_PROVIDER_SITE_OTHER): Payer: Medicare PPO | Admitting: Internal Medicine

## 2017-10-28 ENCOUNTER — Encounter: Payer: Self-pay | Admitting: Internal Medicine

## 2017-10-28 VITALS — BP 132/92 | HR 88 | Ht 59.0 in | Wt 133.0 lb

## 2017-10-28 DIAGNOSIS — K219 Gastro-esophageal reflux disease without esophagitis: Secondary | ICD-10-CM | POA: Diagnosis not present

## 2017-10-28 DIAGNOSIS — K222 Esophageal obstruction: Secondary | ICD-10-CM | POA: Diagnosis not present

## 2017-10-28 DIAGNOSIS — Z8719 Personal history of other diseases of the digestive system: Secondary | ICD-10-CM

## 2017-10-28 DIAGNOSIS — R1310 Dysphagia, unspecified: Secondary | ICD-10-CM

## 2017-10-28 NOTE — Progress Notes (Signed)
HISTORY OF PRESENT ILLNESS:  Heidi Shah is a 72 y.o. female with GERD complicated by erosive esophagitis and peptic stricture as well as sessile serrated polyp on colonoscopy who was evaluated in this office 05/06/2017 regarding recurrent GERD symptoms and significant dysphagia off PPI therapy. She was placed back on PPI therapy (Nexium 40 mg twice daily) and scheduled for upper endoscopy with esophageal dilation which was performed 07/28/2017. She was found to have a distal esophageal stricture which was dilated with 54 [place] Maloney dilator. As well as 3-4 cm hiatal hernia. Otherwise normal exam. No active mucosal esophagitis or edema that time. She was told that she could decrease Nexium to once daily to see if her reflux symptoms were controlled. She was told to follow-up at this time. The patient reports overall she is significantly better. She will experience some breakthrough symptoms on once daily Nexium with dietary indiscretion. She has had rare mild dysphagia though significantly improved since her esophageal dilation. No medication side effects. Still has questions or concerns regarding chronic PPI use. She is up-to-date regarding colorectal neoplasia surveillance.  REVIEW OF SYSTEMS:  All non-GI ROS negative except for cough, itching, urinary frequency, fatigue  Past Medical History:  Diagnosis Date  . Cataract    surgery  . Chicken pox   . Colon polyps   . Depression   . Diverticulitis   . Diverticulosis   . Esophageal stricture   . GERD (gastroesophageal reflux disease)   . Heart murmur   . Hypertension     Past Surgical History:  Procedure Laterality Date  . cataract surgery Bilateral 1996  . HERNIA REPAIR  1990  . NECK SURGERY     as a child    Social History Heidi Shah  reports that she has never smoked. She has never used smokeless tobacco. She reports that she drinks alcohol. She reports that she does not use drugs.  family history includes COPD  in her mother; Cancer in her father; Crohn's disease in her mother; Heart disease (age of onset: 36) in her mother; Heart disease (age of onset: 66) in her father.  Allergies  Allergen Reactions  . Penicillins        PHYSICAL EXAMINATION: Vital signs: BP (!) 132/92 (BP Location: Left Arm, Patient Position: Sitting, Cuff Size: Normal)   Pulse 88   Ht 4\' 11"  (1.499 m) Comment: height measured without shoes  Wt 133 lb (60.3 kg)   BMI 26.86 kg/m   Constitutional: generally well-appearing, no acute distress Psychiatric: alert and oriented x3, cooperative Abdomen: not reexamined.   ASSESSMENT:  #1. Chronic GERD with history of erosive esophagitis. Requires PPI to control symptoms. Currently with some breakthrough symptoms on once daily Nexium #2. Peptic stricture with dysphagia. Improve post dilation #3. History of sessile serrated polyp. Surveillance up-to-date.   PLAN:  #1. Reflux precautions. Strict adherence would be most helpful #2. Continue Nexium 40 mg daily. If significant breakthrough symptoms that increased to twice daily as previous. We discussed current knowledge base regarding chronic PPI use and potential side effects #3. Nexium prescription rewritten for one year #4. Surveillance colonoscopy around March 2022 #5. Routine GI follow-up one year. Contact the office in the interim for questions or problems such as recurrent dysphagia or significant breakthrough symptoms on medication  15 minutes spent face-to-face with the patient. Greater than 50% a time use for counseling regarding her chronic complicated GERD and its management

## 2017-10-28 NOTE — Patient Instructions (Signed)
Continue Nexium 40 mg daily.   Follow up with Dr. Henrene Pastor in one year.

## 2017-11-09 ENCOUNTER — Other Ambulatory Visit: Payer: Self-pay | Admitting: Family Medicine

## 2018-01-11 ENCOUNTER — Other Ambulatory Visit: Payer: Self-pay | Admitting: Family Medicine

## 2018-01-11 DIAGNOSIS — Z1231 Encounter for screening mammogram for malignant neoplasm of breast: Secondary | ICD-10-CM

## 2018-02-16 ENCOUNTER — Ambulatory Visit
Admission: RE | Admit: 2018-02-16 | Discharge: 2018-02-16 | Disposition: A | Discharge status: Home / Self Care | Payer: Medicare PPO | Source: Ambulatory Visit | Attending: Family Medicine | Admitting: Family Medicine

## 2018-02-16 DIAGNOSIS — Z1231 Encounter for screening mammogram for malignant neoplasm of breast: Secondary | ICD-10-CM

## 2018-02-17 ENCOUNTER — Encounter: Payer: Self-pay | Admitting: Family Medicine

## 2018-02-17 ENCOUNTER — Ambulatory Visit (INDEPENDENT_AMBULATORY_CARE_PROVIDER_SITE_OTHER): Payer: Medicare PPO | Admitting: Family Medicine

## 2018-02-17 VITALS — BP 122/80 | HR 79 | Temp 98.1°F | Ht 58.75 in | Wt 129.6 lb

## 2018-02-17 DIAGNOSIS — Z1331 Encounter for screening for depression: Secondary | ICD-10-CM

## 2018-02-17 DIAGNOSIS — Z9181 History of falling: Secondary | ICD-10-CM | POA: Diagnosis not present

## 2018-02-17 DIAGNOSIS — Z0000 Encounter for general adult medical examination without abnormal findings: Secondary | ICD-10-CM

## 2018-02-17 LAB — CBC WITH DIFFERENTIAL/PLATELET
Basophils Absolute: 0.1 10*3/uL (ref 0.0–0.1)
Basophils Relative: 1.2 % (ref 0.0–3.0)
EOS ABS: 0.2 10*3/uL (ref 0.0–0.7)
Eosinophils Relative: 4.4 % (ref 0.0–5.0)
HEMATOCRIT: 41.3 % (ref 36.0–46.0)
HEMOGLOBIN: 13.9 g/dL (ref 12.0–15.0)
Lymphocytes Relative: 36.7 % (ref 12.0–46.0)
Lymphs Abs: 1.8 10*3/uL (ref 0.7–4.0)
MCHC: 33.7 g/dL (ref 30.0–36.0)
MCV: 92.5 fl (ref 78.0–100.0)
MONO ABS: 0.5 10*3/uL (ref 0.1–1.0)
Monocytes Relative: 10.8 % (ref 3.0–12.0)
NEUTROS PCT: 46.9 % (ref 43.0–77.0)
Neutro Abs: 2.3 10*3/uL (ref 1.4–7.7)
Platelets: 318 10*3/uL (ref 150.0–400.0)
RBC: 4.46 Mil/uL (ref 3.87–5.11)
RDW: 13 % (ref 11.5–15.5)
WBC: 5 10*3/uL (ref 4.0–10.5)

## 2018-02-17 LAB — HEPATIC FUNCTION PANEL
ALBUMIN: 4.4 g/dL (ref 3.5–5.2)
ALK PHOS: 57 U/L (ref 39–117)
ALT: 15 U/L (ref 0–35)
AST: 21 U/L (ref 0–37)
BILIRUBIN DIRECT: 0.1 mg/dL (ref 0.0–0.3)
Total Bilirubin: 0.5 mg/dL (ref 0.2–1.2)
Total Protein: 6.6 g/dL (ref 6.0–8.3)

## 2018-02-17 LAB — LIPID PANEL
CHOL/HDL RATIO: 3
CHOLESTEROL: 250 mg/dL — AB (ref 0–200)
HDL: 77.7 mg/dL (ref >39.00)
LDL CALC: 157 mg/dL — AB (ref 0–99)
NonHDL: 172.78
TRIGLYCERIDES: 78 mg/dL (ref 0.0–149.0)
VLDL: 15.6 mg/dL (ref 0.0–40.0)

## 2018-02-17 LAB — TSH: TSH: 0.84 u[IU]/mL (ref 0.35–4.50)

## 2018-02-17 LAB — BASIC METABOLIC PANEL
BUN: 20 mg/dL (ref 6–23)
CO2: 32 meq/L (ref 19–32)
CREATININE: 0.61 mg/dL (ref 0.40–1.20)
Calcium: 9.7 mg/dL (ref 8.4–10.5)
Chloride: 101 mEq/L (ref 96–112)
GFR: 103.05 mL/min (ref >60.00)
Glucose, Bld: 89 mg/dL (ref 70–99)
Potassium: 4.1 mEq/L (ref 3.5–5.1)
Sodium: 140 mEq/L (ref 135–145)

## 2018-02-17 NOTE — Patient Instructions (Signed)
Health Maintenance for Postmenopausal Women Menopause is a normal process in which your reproductive ability comes to an end. This process happens gradually over a span of months to years, usually between the ages of 22 and 9. Menopause is complete when you have missed 12 consecutive menstrual periods. It is important to talk with your health care provider about some of the most common conditions that affect postmenopausal women, such as heart disease, cancer, and bone loss (osteoporosis). Adopting a healthy lifestyle and getting preventive care can help to promote your health and wellness. Those actions can also lower your chances of developing some of these common conditions. What should I know about menopause? During menopause, you may experience a number of symptoms, such as:  Moderate-to-severe hot flashes.  Night sweats.  Decrease in sex drive.  Mood swings.  Headaches.  Tiredness.  Irritability.  Memory problems.  Insomnia.  Choosing to treat or not to treat menopausal changes is an individual decision that you make with your health care provider. What should I know about hormone replacement therapy and supplements? Hormone therapy products are effective for treating symptoms that are associated with menopause, such as hot flashes and night sweats. Hormone replacement carries certain risks, especially as you become older. If you are thinking about using estrogen or estrogen with progestin treatments, discuss the benefits and risks with your health care provider. What should I know about heart disease and stroke? Heart disease, heart attack, and stroke become more likely as you age. This may be due, in part, to the hormonal changes that your body experiences during menopause. These can affect how your body processes dietary fats, triglycerides, and cholesterol. Heart attack and stroke are both medical emergencies. There are many things that you can do to help prevent heart disease  and stroke:  Have your blood pressure checked at least every 1-2 years. High blood pressure causes heart disease and increases the risk of stroke.  If you are 53-22 years old, ask your health care provider if you should take aspirin to prevent a heart attack or a stroke.  Do not use any tobacco products, including cigarettes, chewing tobacco, or electronic cigarettes. If you need help quitting, ask your health care provider.  It is important to eat a healthy diet and maintain a healthy weight. ? Be sure to include plenty of vegetables, fruits, low-fat dairy products, and lean protein. ? Avoid eating foods that are high in solid fats, added sugars, or salt (sodium).  Get regular exercise. This is one of the most important things that you can do for your health. ? Try to exercise for at least 150 minutes each week. The type of exercise that you do should increase your heart rate and make you sweat. This is known as moderate-intensity exercise. ? Try to do strengthening exercises at least twice each week. Do these in addition to the moderate-intensity exercise.  Know your numbers.Ask your health care provider to check your cholesterol and your blood glucose. Continue to have your blood tested as directed by your health care provider.  What should I know about cancer screening? There are several types of cancer. Take the following steps to reduce your risk and to catch any cancer development as early as possible. Breast Cancer  Practice breast self-awareness. ? This means understanding how your breasts normally appear and feel. ? It also means doing regular breast self-exams. Let your health care provider know about any changes, no matter how small.  If you are 40  or older, have a clinician do a breast exam (clinical breast exam or CBE) every year. Depending on your age, family history, and medical history, it may be recommended that you also have a yearly breast X-ray (mammogram).  If you  have a family history of breast cancer, talk with your health care provider about genetic screening.  If you are at high risk for breast cancer, talk with your health care provider about having an MRI and a mammogram every year.  Breast cancer (BRCA) gene test is recommended for women who have family members with BRCA-related cancers. Results of the assessment will determine the need for genetic counseling and BRCA1 and for BRCA2 testing. BRCA-related cancers include these types: ? Breast. This occurs in males or females. ? Ovarian. ? Tubal. This may also be called fallopian tube cancer. ? Cancer of the abdominal or pelvic lining (peritoneal cancer). ? Prostate. ? Pancreatic.  Cervical, Uterine, and Ovarian Cancer Your health care provider may recommend that you be screened regularly for cancer of the pelvic organs. These include your ovaries, uterus, and vagina. This screening involves a pelvic exam, which includes checking for microscopic changes to the surface of your cervix (Pap test).  For women ages 21-65, health care providers may recommend a pelvic exam and a Pap test every three years. For women ages 79-65, they may recommend the Pap test and pelvic exam, combined with testing for human papilloma virus (HPV), every five years. Some types of HPV increase your risk of cervical cancer. Testing for HPV may also be done on women of any age who have unclear Pap test results.  Other health care providers may not recommend any screening for nonpregnant women who are considered low risk for pelvic cancer and have no symptoms. Ask your health care provider if a screening pelvic exam is right for you.  If you have had past treatment for cervical cancer or a condition that could lead to cancer, you need Pap tests and screening for cancer for at least 20 years after your treatment. If Pap tests have been discontinued for you, your risk factors (such as having a new sexual partner) need to be  reassessed to determine if you should start having screenings again. Some women have medical problems that increase the chance of getting cervical cancer. In these cases, your health care provider may recommend that you have screening and Pap tests more often.  If you have a family history of uterine cancer or ovarian cancer, talk with your health care provider about genetic screening.  If you have vaginal bleeding after reaching menopause, tell your health care provider.  There are currently no reliable tests available to screen for ovarian cancer.  Lung Cancer Lung cancer screening is recommended for adults 69-62 years old who are at high risk for lung cancer because of a history of smoking. A yearly low-dose CT scan of the lungs is recommended if you:  Currently smoke.  Have a history of at least 30 pack-years of smoking and you currently smoke or have quit within the past 15 years. A pack-year is smoking an average of one pack of cigarettes per day for one year.  Yearly screening should:  Continue until it has been 15 years since you quit.  Stop if you develop a health problem that would prevent you from having lung cancer treatment.  Colorectal Cancer  This type of cancer can be detected and can often be prevented.  Routine colorectal cancer screening usually begins at  age 42 and continues through age 45.  If you have risk factors for colon cancer, your health care provider may recommend that you be screened at an earlier age.  If you have a family history of colorectal cancer, talk with your health care provider about genetic screening.  Your health care provider may also recommend using home test kits to check for hidden blood in your stool.  A small camera at the end of a tube can be used to examine your colon directly (sigmoidoscopy or colonoscopy). This is done to check for the earliest forms of colorectal cancer.  Direct examination of the colon should be repeated every  5-10 years until age 71. However, if early forms of precancerous polyps or small growths are found or if you have a family history or genetic risk for colorectal cancer, you may need to be screened more often.  Skin Cancer  Check your skin from head to toe regularly.  Monitor any moles. Be sure to tell your health care provider: ? About any new moles or changes in moles, especially if there is a change in a mole's shape or color. ? If you have a mole that is larger than the size of a pencil eraser.  If any of your family members has a history of skin cancer, especially at a young age, talk with your health care provider about genetic screening.  Always use sunscreen. Apply sunscreen liberally and repeatedly throughout the day.  Whenever you are outside, protect yourself by wearing long sleeves, pants, a wide-brimmed hat, and sunglasses.  What should I know about osteoporosis? Osteoporosis is a condition in which bone destruction happens more quickly than new bone creation. After menopause, you may be at an increased risk for osteoporosis. To help prevent osteoporosis or the bone fractures that can happen because of osteoporosis, the following is recommended:  If you are 46-71 years old, get at least 1,000 mg of calcium and at least 600 mg of vitamin D per day.  If you are older than age 55 but younger than age 65, get at least 1,200 mg of calcium and at least 600 mg of vitamin D per day.  If you are older than age 54, get at least 1,200 mg of calcium and at least 800 mg of vitamin D per day.  Smoking and excessive alcohol intake increase the risk of osteoporosis. Eat foods that are rich in calcium and vitamin D, and do weight-bearing exercises several times each week as directed by your health care provider. What should I know about how menopause affects my mental health? Depression may occur at any age, but it is more common as you become older. Common symptoms of depression  include:  Low or sad mood.  Changes in sleep patterns.  Changes in appetite or eating patterns.  Feeling an overall lack of motivation or enjoyment of activities that you previously enjoyed.  Frequent crying spells.  Talk with your health care provider if you think that you are experiencing depression. What should I know about immunizations? It is important that you get and maintain your immunizations. These include:  Tetanus, diphtheria, and pertussis (Tdap) booster vaccine.  Influenza every year before the flu season begins.  Pneumonia vaccine.  Shingles vaccine.  Your health care provider may also recommend other immunizations. This information is not intended to replace advice given to you by your health care provider. Make sure you discuss any questions you have with your health care provider. Document Released: 05/07/2005  Document Revised: 10/03/2015 Document Reviewed: 12/17/2014 Elsevier Interactive Patient Education  2018 [employer] Inc[employer]  

## 2018-02-17 NOTE — Progress Notes (Signed)
Subjective:     Patient ID: Heidi Shah, female   DOB: Aug 03, 1945, 72 y.o.   MRN: [id number]  HPI Patient seen for physical exam.  She has history of hypertension which is stable on lisinopril HCTZ.  She has a membership at the gym but has not been exercising regularly.  We discussed several health maintenance issues as below  -Has already had flu vaccine and has had both pneumonia vaccines -Previous hepatitis C screening negative -Mammogram yesterday negative -Colonoscopy 2017 with recommended 5-year follow-up -Tetanus 2017 -Bone density 02/26/2016 with osteopenia with T score -1.9  Only concern is that she states she has had some issues with balance for several years.  She would like to consider possible physical therapy referral.  No frequent falls.  She thinks a lot of her balance issues are related to poor vision.  She is treated for glaucoma  Past Medical History:  Diagnosis Date  . Cataract    surgery  . Chicken pox   . Colon polyps   . Depression   . Diverticulitis   . Diverticulosis   . Esophageal stricture   . GERD (gastroesophageal reflux disease)   . Heart murmur   . Hypertension    Past Surgical History:  Procedure Laterality Date  . cataract surgery Bilateral 1996  . HERNIA REPAIR  1990  . NECK SURGERY     as a child    reports that she has never smoked. She has never used smokeless tobacco. She reports that she drinks alcohol. She reports that she does not use drugs. family history includes COPD in her mother; Cancer in her father; Crohn's disease in her mother; Heart disease (age of onset: 57) in her mother; Heart disease (age of onset: 97) in her father. Allergies  Allergen Reactions  . Penicillins      Review of Systems  Constitutional: Negative for activity change, appetite change, fatigue, fever and unexpected weight change.  HENT: Negative for ear pain, hearing loss, sore throat and trouble swallowing.   Eyes: Negative for visual  disturbance.  Respiratory: Negative for cough and shortness of breath.   Cardiovascular: Negative for chest pain and palpitations.  Gastrointestinal: Negative for abdominal pain, blood in stool, constipation and diarrhea.  Genitourinary: Negative for dysuria and hematuria.  Musculoskeletal: Negative for arthralgias, back pain and myalgias.  Skin: Negative for rash.  Neurological: Negative for dizziness, syncope and headaches.  Hematological: Negative for adenopathy.  Psychiatric/Behavioral: Negative for confusion and dysphoric mood.       Objective:   Physical Exam  Constitutional: She is oriented to person, place, and time. She appears well-developed and well-nourished.  HENT:  Head: Normocephalic and atraumatic.  Eyes: Pupils are equal, round, and reactive to light. EOM are normal.  Neck: Normal range of motion. Neck supple. No thyromegaly present.  Cardiovascular: Normal rate, regular rhythm and normal heart sounds.  No murmur heard. Pulmonary/Chest: Breath sounds normal. No respiratory distress. She has no wheezes. She has no rales.  Abdominal: Soft. Bowel sounds are normal. She exhibits no distension and no mass. There is no tenderness. There is no rebound and no guarding.  Musculoskeletal: Normal range of motion. She exhibits no edema.  Lymphadenopathy:    She has no cervical adenopathy.  Neurological: She is alert and oriented to person, place, and time. She displays normal reflexes. No cranial nerve deficit.  Skin: No rash noted.  Psychiatric: She has a normal mood and affect. Her behavior is normal. Judgment and thought content normal.  Assessment:     Physical exam.  Several health maintenance issues were addressed as below    Plan:     -She had Pap smear last year which was normal.  She is low risk.  She will continue with yearly mammogram.  Flu vaccine already given.  We discussed checking about new shingles vaccine  -Set up physical therapy for fall risk  reduction and balance training  -Obtain follow-up labs  Eulas Post MD [facility]

## 2018-03-07 ENCOUNTER — Ambulatory Visit: Payer: BC Managed Care – PPO

## 2018-03-30 ENCOUNTER — Ambulatory Visit: Payer: Medicare PPO | Admitting: Rehabilitative and Restorative Service Providers"

## 2018-03-31 ENCOUNTER — Emergency Department (INDEPENDENT_AMBULATORY_CARE_PROVIDER_SITE_OTHER)
Admission: EM | Admit: 2018-03-31 | Discharge: 2018-03-31 | Payer: Medicare PPO | Source: Home / Self Care | Attending: Emergency Medicine | Admitting: Emergency Medicine

## 2018-03-31 ENCOUNTER — Ambulatory Visit: Payer: Medicare PPO | Admitting: Physical Therapy

## 2018-03-31 NOTE — ED Provider Notes (Signed)
[facility]    CSN: [id number] Arrival date & time: 03/31/18  0825     History   Chief Complaint No chief complaint on file.   HPI Heidi Shah is a 73 y.o. female.  Patient registered in this clinic but needed to be seen in physical therapy and was transferred to that clinic. HPI  Past Medical History:  Diagnosis Date  . Cataract    surgery  . Chicken pox   . Colon polyps   . Depression   . Diverticulitis   . Diverticulosis   . Esophageal stricture   . GERD (gastroesophageal reflux disease)   . Heart murmur   . Hypertension     Patient Active Problem List   Diagnosis Date Noted  . Unspecified constipation 03/05/2013  . GERD (gastroesophageal reflux disease) 01/19/2013  . Esophageal spasm 01/19/2013  . Dysphagia, unspecified(787.20) 01/19/2013  . Diverticulitis of colon (without mention of hemorrhage)(562.11) 11/23/2012  . History of depression 11/23/2012  . Essential hypertension, benign 11/23/2012    Past Surgical History:  Procedure Laterality Date  . cataract surgery Bilateral 1996  . HERNIA REPAIR  1990  . NECK SURGERY     as a child    OB History   No obstetric history on file.      Home Medications    Prior to Admission medications   Medication Sig Start Date End Date Taking? Authorizing Provider  ALPRAZolam (XANAX) 0.25 MG tablet TAKE ONE TABLET BY MOUTH TWICE A DAY AS NEEDED. **NOTE: PER DR. Elease Hashimoto, USE THESE SPARINGLY** 08/11/17   Burchette, Alinda Sierras, MD  aspirin 81 MG tablet Take 81 mg by mouth daily.    [provider]  Cholecalciferol (D3 ADULT PO) Take by mouth. daily    [provider]  doxycycline (VIBRAMYCIN) 100 MG capsule Take 1 capsule by mouth 2 (two) times daily. 10/17/17   [provider]  esomeprazole (NEXIUM) 40 MG capsule TAKE ONE CAPSULE BY MOUTH TWICE A DAY AT 8 AM AND 10 PM 08/15/17   Irene Shipper, MD  lisinopril-hydrochlorothiazide (PRINZIDE,ZESTORETIC) 20-12.5 MG tablet TAKE  ONE TABLET BY MOUTH ONCE DAILY 11/09/17   Burchette, Alinda Sierras, MD  Misc Natural Products (GINKOGIN PO) Take by mouth.    [provider]  Multiple Vitamin (MULTIVITAMIN) tablet Take 1 tablet by mouth daily.    [provider]  saccharomyces boulardii (FLORASTOR) 250 MG capsule Take 250 mg by mouth 2 (two) times daily.    [provider]  tacrolimus (PROTOPIC) 0.1 % ointment  02/03/18   [provider]  vitamin C (ASCORBIC ACID) 500 MG tablet Take 500 mg by mouth daily.    [provider]    Family History Family History  Problem Relation Age of Onset  . Heart disease Mother 33  . COPD Mother   . Crohn's disease Mother   . Heart disease Father 76  . Cancer Father        kidney  . Colon cancer Neg Hx   . Esophageal cancer Neg Hx   . Stomach cancer Neg Hx     Social History Social History   Tobacco Use  . Smoking status: Never Smoker  . Smokeless tobacco: Never Used  Substance Use Topics  . Alcohol use: Yes    Alcohol/week: 0.0 standard drinks    Comment: one or two glasses of wine a couple times a week  . Drug use: No     Allergies   Penicillins  Review of Systems Review of Systems   Physical Exam Triage Vital Signs ED Triage Vitals  Enc Vitals Group     BP      Pulse      Resp      Temp      Temp src      SpO2      Weight      Height      Head Circumference      Peak Flow      Pain Score      Pain Loc      Pain Edu?      Excl. in [place]?    No data found.  Updated Vital Signs There were no vitals taken for this visit.  Visual Acuity Right Eye Distance:   Left Eye Distance:   Bilateral Distance:    Right Eye Near:   Left Eye Near:    Bilateral Near:     Physical Exam   UC Treatments / Results  Labs (all labs ordered are listed, but only abnormal results are displayed) Labs Reviewed - No data to display  EKG None  Radiology No results found.  Procedures Procedures (including critical care  time)  Medications Ordered in UC Medications - No data to display  Initial Impression / Assessment and Plan / UC Course  I have reviewed the triage vital signs and the nursing notes.  Pertinent labs & imaging results that were available during my care of the patient were reviewed by me and considered in my medical decision making (see chart for details). Patient sent to physical therapy for evaluation     Final Clinical Impressions(s) / UC Diagnoses   Final diagnoses:  None   Discharge Instructions   None    ED Prescriptions    None     Controlled Substance Prescriptions [place] Controlled Substance Registry consulted? Not Applicable   Darlyne Russian, MD 03/31/18 [phone]

## 2018-04-05 DIAGNOSIS — L309 Dermatitis, unspecified: Secondary | ICD-10-CM | POA: Diagnosis not present

## 2018-04-05 DIAGNOSIS — L308 Other specified dermatitis: Secondary | ICD-10-CM | POA: Diagnosis not present

## 2018-04-05 DIAGNOSIS — H40009 Preglaucoma, unspecified, unspecified eye: Secondary | ICD-10-CM | POA: Insufficient documentation

## 2018-04-06 ENCOUNTER — Ambulatory Visit: Payer: Medicare PPO

## 2018-04-10 DIAGNOSIS — L235 Allergic contact dermatitis due to other chemical products: Secondary | ICD-10-CM | POA: Diagnosis not present

## 2018-04-14 ENCOUNTER — Other Ambulatory Visit: Payer: Self-pay

## 2018-04-14 ENCOUNTER — Telehealth: Payer: Self-pay | Admitting: Family Medicine

## 2018-04-14 MED ORDER — LISINOPRIL-HYDROCHLOROTHIAZIDE 20-12.5 MG PO TABS: 1.0000 | ORAL_TABLET | Freq: Every day | ORAL | 1 refills | Status: DC

## 2018-04-14 NOTE — Telephone Encounter (Signed)
Medication has been sent to the [facility] pharmacy for the patient.

## 2018-04-14 NOTE — Telephone Encounter (Signed)
Copied from [place] [phone]. Topic: Quick Communication - Rx Refill/Question >> Apr 14, 2018 10:16 AM Rayann Heman wrote: Medication: lisinopril-hydrochlorothiazide (PRINZIDE,ZESTORETIC) 20-12.5 MG tablet [[id number]]   Has the patient contacted their pharmacy? yes Preferred Pharmacy (with phone number or street name): [facility] [address] [place], [place]. [phone] (Phone) [phone] (Fax)   Agent: Please be advised that RX refills may take up to 3 business days. We ask that you follow-up with your pharmacy.

## 2018-04-17 ENCOUNTER — Ambulatory Visit: Payer: Medicare PPO | Admitting: Physical Therapy

## 2018-04-17 ENCOUNTER — Ambulatory Visit: Payer: Medicare HMO

## 2018-04-17 ENCOUNTER — Ambulatory Visit (INDEPENDENT_AMBULATORY_CARE_PROVIDER_SITE_OTHER): Payer: Medicare HMO

## 2018-04-17 VITALS — BP 122/80 | HR 99 | Temp 98.2°F | Resp 20 | Ht 59.0 in | Wt 128.0 lb

## 2018-04-17 DIAGNOSIS — Z0000 Encounter for general adult medical examination without abnormal findings: Secondary | ICD-10-CM

## 2018-04-17 DIAGNOSIS — Z13820 Encounter for screening for osteoporosis: Secondary | ICD-10-CM

## 2018-04-17 NOTE — Patient Instructions (Addendum)
Advance healthcare planning booklet provided; please review with family and bring with you to next appointment.  Follow up with Dr. Henrene Pastor about your reflux.  Refer to cholesterol-lowering education provided.    You have been set up with MyChart! Call us for any questions or concerns, especially if urgent.   Good luck with PT this week! Keep staying physically and mentally active!   Fat and Cholesterol Restricted Eating Plan Eating a diet that limits fat and cholesterol may help lower your risk for heart disease and other conditions. Your body needs fat and cholesterol for basic functions, but eating too much of these things can be harmful to your health. Your health care provider may order lab tests to check your blood fat (lipid) and cholesterol levels. This helps your health care provider understand your risk for certain conditions and whether you need to make diet changes. Work with your health care provider or dietitian to make an eating plan that is right for you. Your plan includes:  Limit your fat intake to ______% or less of your total calories a day.  Limit your saturated fat intake to ______% or less of your total calories a day.  Limit the amount of cholesterol in your diet to less than _________mg a day.  Eat ___________ g of fiber a day. What are tips for following this plan? General guidelines   If you are overweight, work with your health care provider to lose weight safely. Losing just 5-10% of your body weight can improve your overall health and help prevent diseases such as diabetes and heart disease.  Avoid: ? Foods with added sugar. ? Fried foods. ? Foods that contain partially hydrogenated oils, including stick margarine, some tub margarines, cookies, crackers, and other baked goods.  Limit alcohol intake to no more than 1 drink a day for nonpregnant women and 2 drinks a day for men. One drink equals 12 oz of beer, 5 oz of wine, or 1 oz of hard liquor. Reading  food labels  Check food labels for: ? Trans fats, partially hydrogenated oils, or high amounts of saturated fat. Avoid foods that contain saturated fat and trans fat. ? The amount of cholesterol in each serving. Try to eat no more than 200 mg of cholesterol each day. ? The amount of fiber in each serving. Try to eat at least 20-30 g of fiber each day.  Choose foods with healthy fats, such as: ? Monounsaturated and polyunsaturated fats. These include olive and canola oil, flaxseeds, walnuts, almonds, and seeds. ? Omega-3 fats. These are found in foods such as salmon, mackerel, sardines, tuna, flaxseed oil, and ground flaxseeds.  Choose grain products that have whole grains. Look for the word "whole" as the first word in the ingredient list. Cooking  Cook foods using methods other than frying. Baking, boiling, grilling, and broiling are some healthy options.  Eat more home-cooked food and less restaurant, buffet, and fast food.  Avoid cooking using saturated fats. ? Animal sources of saturated fats include meats, butter, and cream. ? Plant sources of saturated fats include palm oil, palm kernel oil, and coconut oil. Meal planning   At meals, imagine dividing your plate into fourths: ? Fill one-half of your plate with vegetables and green salads. ? Fill one-fourth of your plate with whole grains. ? Fill one-fourth of your plate with lean protein foods.  Eat fish that is high in omega-3 fats at least two times a week.  Eat more foods that contain fiber,  such as whole grains, beans, apples, broccoli, carrots, peas, and barley. These foods help promote healthy cholesterol levels in the blood. Recommended foods Grains  Whole grains, such as whole wheat or whole grain breads, crackers, cereals, and pasta. Unsweetened oatmeal, bulgur, barley, quinoa, or brown rice. Corn or whole wheat flour tortillas. Vegetables  Fresh or frozen vegetables (raw, steamed, roasted, or grilled). Green  salads. Fruits  All fresh, canned (in natural juice), or frozen fruits. Meats and other protein foods  Ground beef (85% or leaner), grass-fed beef, or beef trimmed of fat. Skinless chicken or [place]. Ground chicken or [place]. Pork trimmed of fat. All fish and seafood. Egg whites. Dried beans, peas, or lentils. Unsalted nuts or seeds. Unsalted canned beans. Natural nut butters without added sugar and oil. Dairy  Low-fat or nonfat dairy products, such as skim or 1% milk, 2% or reduced-fat cheeses, low-fat and fat-free ricotta or cottage cheese, or plain low-fat and nonfat yogurt. Fats and oils  Tub margarine without trans fats. Light or reduced-fat mayonnaise and salad dressings. Avocado. Olive, canola, sesame, or safflower oils. The items listed above may not be a complete list of recommended foods or beverages. Contact your dietitian for more options. Foods to avoid Grains  White bread. White pasta. White rice. Cornbread. Bagels, pastries, and croissants. Crackers and snack foods that contain trans fat and hydrogenated oils. Vegetables  Vegetables cooked in cheese, cream, or butter sauce. Fried vegetables. Fruits  Canned fruit in heavy syrup. Fruit in cream or butter sauce. Fried fruit. Meats and other protein foods  Fatty cuts of meat. Ribs, chicken wings, bacon, sausage, bologna, salami, chitterlings, fatback, hot dogs, bratwurst, and packaged lunch meats. Liver and organ meats. Whole eggs and egg yolks. Chicken and [place] with skin. Fried meat. Dairy  Whole or 2% milk, cream, half-and-half, and cream cheese. Whole milk cheeses. Whole-fat or sweetened yogurt. Full-fat cheeses. Nondairy creamers and whipped toppings. Processed cheese, cheese spreads, and cheese curds. Beverages  Alcohol. Sugar-sweetened drinks such as sodas, lemonade, and fruit drinks. Fats and oils  Butter, stick margarine, lard, shortening, ghee, or bacon fat. Coconut, palm kernel, and palm oils. Sweets and  desserts  Corn syrup, sugars, honey, and molasses. Candy. Jam and jelly. Syrup. Sweetened cereals. Cookies, pies, cakes, donuts, muffins, and ice cream. The items listed above may not be a complete list of foods and beverages to avoid. Contact your dietitian for more information. Summary  Your body needs fat and cholesterol for basic functions. However, eating too much of these things can be harmful to your health.  Work with your health care provider and dietitian to follow a diet low in fat and cholesterol. Doing this may help lower your risk for heart disease and other conditions.  Choose healthy fats, such as monounsaturated and polyunsaturated fats, and foods high in omega-3 fatty acids.  Eat fiber-rich foods, such as whole grains, beans, peas, fruits, and vegetables.  Limit or avoid alcohol, fried foods, and foods high in saturated fats, partially hydrogenated oils, and sugar. This information is not intended to replace advice given to you by your health care provider. Make sure you discuss any questions you have with your health care provider. Document Released: 03/15/2005 Document Revised: 11/30/2016 Document Reviewed: 11/30/2016 Elsevier Interactive Patient Education  2019 [place] Maintenance, Female Adopting a healthy lifestyle and getting preventive care can go a long way to promote health and wellness. Talk with your health care provider about what schedule of regular examinations is right  for you. This is a good chance for you to check in with your provider about disease prevention and staying healthy. In between checkups, there are plenty of things you can do on your own. Experts have done a lot of research about which lifestyle changes and preventive measures are most likely to keep you healthy. Ask your health care provider for more information. Weight and diet Eat a healthy diet  Be sure to include plenty of vegetables, fruits, low-fat dairy products, and lean  protein.  Do not eat a lot of foods high in solid fats, added sugars, or salt.  Get regular exercise. This is one of the most important things you can do for your health. ? Most adults should exercise for at least 150 minutes each week. The exercise should increase your heart rate and make you sweat (moderate-intensity exercise). ? Most adults should also do strengthening exercises at least twice a week. This is in addition to the moderate-intensity exercise. Maintain a healthy weight  Body mass index (BMI) is a measurement that can be used to identify possible weight problems. It estimates body fat based on height and weight. Your health care provider can help determine your BMI and help you achieve or maintain a healthy weight.  For females 46 years of age and older: ? A BMI below 18.5 is considered underweight. ? A BMI of 18.5 to 24.9 is normal. ? A BMI of 25 to 29.9 is considered overweight. ? A BMI of 30 and above is considered obese. Watch levels of cholesterol and blood lipids  You should start having your blood tested for lipids and cholesterol at 71 years of age, then have this test every 5 years.  You may need to have your cholesterol levels checked more often if: ? Your lipid or cholesterol levels are high. ? You are older than 71 years of age. ? You are at high risk for heart disease. Cancer screening Lung Cancer  Lung cancer screening is recommended for adults 64-30 years old who are at high risk for lung cancer because of a history of smoking.  A yearly low-dose CT scan of the lungs is recommended for people who: ? Currently smoke. ? Have quit within the past 15 years. ? Have at least a 30-pack-year history of smoking. A pack year is smoking an average of one pack of cigarettes a day for 1 year.  Yearly screening should continue until it has been 15 years since you quit.  Yearly screening should stop if you develop a health problem that would prevent you from having  lung cancer treatment. Breast Cancer  Practice breast self-awareness. This means understanding how your breasts normally appear and feel.  It also means doing regular breast self-exams. Let your health care provider know about any changes, no matter how small.  If you are in your 20s or 30s, you should have a clinical breast exam (CBE) by a health care provider every 1-3 years as part of a regular health exam.  If you are 78 or older, have a CBE every year. Also consider having a breast X-ray (mammogram) every year.  If you have a family history of breast cancer, talk to your health care provider about genetic screening.  If you are at high risk for breast cancer, talk to your health care provider about having an MRI and a mammogram every year.  Breast cancer gene (BRCA) assessment is recommended for women who have family members with BRCA-related cancers. BRCA-related cancers include: ?  Breast. ? Ovarian. ? Tubal. ? Peritoneal cancers.  Results of the assessment will determine the need for genetic counseling and BRCA1 and BRCA2 testing. Cervical Cancer Your health care provider may recommend that you be screened regularly for cancer of the pelvic organs (ovaries, uterus, and vagina). This screening involves a pelvic examination, including checking for microscopic changes to the surface of your cervix (Pap test). You may be encouraged to have this screening done every 3 years, beginning at age 73.  For women ages 80-65, health care providers may recommend pelvic exams and Pap testing every 3 years, or they may recommend the Pap and pelvic exam, combined with testing for human papilloma virus (HPV), every 5 years. Some types of HPV increase your risk of cervical cancer. Testing for HPV may also be done on women of any age with unclear Pap test results.  Other health care providers may not recommend any screening for nonpregnant women who are considered low risk for pelvic cancer and who do  not have symptoms. Ask your health care provider if a screening pelvic exam is right for you.  If you have had past treatment for cervical cancer or a condition that could lead to cancer, you need Pap tests and screening for cancer for at least 20 years after your treatment. If Pap tests have been discontinued, your risk factors (such as having a new sexual partner) need to be reassessed to determine if screening should resume. Some women have medical problems that increase the chance of getting cervical cancer. In these cases, your health care provider may recommend more frequent screening and Pap tests. Colorectal Cancer  This type of cancer can be detected and often prevented.  Routine colorectal cancer screening usually begins at 71 years of age and continues through 71 years of age.  Your health care provider may recommend screening at an earlier age if you have risk factors for colon cancer.  Your health care provider may also recommend using home test kits to check for hidden blood in the stool.  A small camera at the end of a tube can be used to examine your colon directly (sigmoidoscopy or colonoscopy). This is done to check for the earliest forms of colorectal cancer.  Routine screening usually begins at age 65.  Direct examination of the colon should be repeated every 5-10 years through 71 years of age. However, you may need to be screened more often if early forms of precancerous polyps or small growths are found. Skin Cancer  Check your skin from head to toe regularly.  Tell your health care provider about any new moles or changes in moles, especially if there is a change in a mole's shape or color.  Also tell your health care provider if you have a mole that is larger than the size of a pencil eraser.  Always use sunscreen. Apply sunscreen liberally and repeatedly throughout the day.  Protect yourself by wearing long sleeves, pants, a wide-brimmed hat, and sunglasses  whenever you are outside. Heart disease, diabetes, and high blood pressure  High blood pressure causes heart disease and increases the risk of stroke. High blood pressure is more likely to develop in: ? People who have blood pressure in the high end of the normal range (130-139/85-89 mm Hg). ? People who are overweight or obese. ? People who are African American.  If you are 28-72 years of age, have your blood pressure checked every 3-5 years. If you are 47 years of age or  older, have your blood pressure checked every year. You should have your blood pressure measured twice-once when you are at a hospital or clinic, and once when you are not at a hospital or clinic. Record the average of the two measurements. To check your blood pressure when you are not at a hospital or clinic, you can use: ? An automated blood pressure machine at a pharmacy. ? A home blood pressure monitor.  If you are between 49 years and 72 years old, ask your health care provider if you should take aspirin to prevent strokes.  Have regular diabetes screenings. This involves taking a blood sample to check your fasting blood sugar level. ? If you are at a normal weight and have a low risk for diabetes, have this test once every three years after 71 years of age. ? If you are overweight and have a high risk for diabetes, consider being tested at a younger age or more often. Preventing infection Hepatitis B  If you have a higher risk for hepatitis B, you should be screened for this virus. You are considered at high risk for hepatitis B if: ? You were born in a country where hepatitis B is common. Ask your health care provider which countries are considered high risk. ? Your parents were born in a high-risk country, and you have not been immunized against hepatitis B (hepatitis B vaccine). ? You have HIV or AIDS. ? You use needles to inject street drugs. ? You live with someone who has hepatitis B. ? You have had sex with  someone who has hepatitis B. ? You get hemodialysis treatment. ? You take certain medicines for conditions, including cancer, organ transplantation, and autoimmune conditions. Hepatitis C  Blood testing is recommended for: ? Everyone born from [birth year] through 1963. ? Anyone with known risk factors for hepatitis C. Sexually transmitted infections (STIs)  You should be screened for sexually transmitted infections (STIs) including gonorrhea and chlamydia if: ? You are sexually active and are younger than 71 years of age. ? You are older than 71 years of age and your health care provider tells you that you are at risk for this type of infection. ? Your sexual activity has changed since you were last screened and you are at an increased risk for chlamydia or gonorrhea. Ask your health care provider if you are at risk.  If you do not have HIV, but are at risk, it may be recommended that you take a prescription medicine daily to prevent HIV infection. This is called pre-exposure prophylaxis (PrEP). You are considered at risk if: ? You are sexually active and do not regularly use condoms or know the HIV status of your partner(s). ? You take drugs by injection. ? You are sexually active with a partner who has HIV. Talk with your health care provider about whether you are at high risk of being infected with HIV. If you choose to begin PrEP, you should first be tested for HIV. You should then be tested every 3 months for as long as you are taking PrEP. Pregnancy  If you are premenopausal and you may become pregnant, ask your health care provider about preconception counseling.  If you may become pregnant, take 400 to 800 micrograms (mcg) of folic acid every day.  If you want to prevent pregnancy, talk to your health care provider about birth control (contraception). Osteoporosis and menopause  Osteoporosis is a disease in which the bones lose minerals and strength with aging.  This can result in  serious bone fractures. Your risk for osteoporosis can be identified using a bone density scan.  If you are 56 years of age or older, or if you are at risk for osteoporosis and fractures, ask your health care provider if you should be screened.  Ask your health care provider whether you should take a calcium or vitamin D supplement to lower your risk for osteoporosis.  Menopause may have certain physical symptoms and risks.  Hormone replacement therapy may reduce some of these symptoms and risks. Talk to your health care provider about whether hormone replacement therapy is right for you. Follow these instructions at home:  Schedule regular health, dental, and eye exams.  Stay current with your immunizations.  Do not use any tobacco products including cigarettes, chewing tobacco, or electronic cigarettes.  If you are pregnant, do not drink alcohol.  If you are breastfeeding, limit how much and how often you drink alcohol.  Limit alcohol intake to no more than 1 drink per day for nonpregnant women. One drink equals 12 ounces of beer, 5 ounces of wine, or 1 ounces of hard liquor.  Do not use street drugs.  Do not share needles.  Ask your health care provider for help if you need support or information about quitting drugs.  Tell your health care provider if you often feel depressed.  Tell your health care provider if you have ever been abused or do not feel safe at home. This information is not intended to replace advice given to you by your health care provider. Make sure you discuss any questions you have with your health care provider. Document Released: 09/28/2010 Document Revised: 08/21/2015 Document Reviewed: 12/17/2014 Elsevier Interactive Patient Education  2019 [employer].

## 2018-04-17 NOTE — Progress Notes (Addendum)
Subjective:   Heidi Shah is a 73 y.o. female who presents for Medicare Annual (Subsequent) preventive examination.  Review of Systems:  No ROS.  Medicare Wellness Visit. Additional risk factors are reflected in the social history.  Cardiac Risk Factors include: dyslipidemia;advanced age (>27men, >81 women) Sleep patterns: no sleep issues and feels rested on waking.    Home Safety/Smoke Alarms: Feels safe in home. Smoke alarms in place.  Living environment; residence and Firearm Safety: 1-story house/ trailer. Seat Belt Safety/Bike Helmet: Wears seat belt.   Female: Pap- N/A as pt. Over 65yo      Mammo- 01/2018, due 01/2019     Dexa scan- 01/2016, due 01/2018 per Dr. Erick Blinks notation. Order placed for Dr. Erick Blinks approval.          CCS- 05/2015, due 04/2020     Objective:     Vitals: BP 122/80 (BP Location: Right Arm, Patient Position: Sitting, Cuff Size: Normal)   Pulse 99   Temp 98.2 F (36.8 C) (Oral)   Resp 20   Ht 4\' 11"  (1.499 m)   Wt 128 lb (58.1 kg)   SpO2 98%   BMI 25.85 kg/m   Body mass index is 25.85 kg/m.  Advanced Directives 04/17/2018 07/28/2017 03/04/2017  Does Patient Have a Medical Advance Directive? No No No  Would patient like information on creating a medical advance directive? Yes (MAU/Ambulatory/Procedural Areas - Information given) - -    Tobacco Social History   Tobacco Use  Smoking Status Never Smoker  Smokeless Tobacco Never Used     Counseling given: Not Answered   Past Medical History:  Diagnosis Date  . Cataract    surgery  . Chicken pox   . Colon polyps   . Depression   . Diverticulitis   . Diverticulosis   . Esophageal stricture   . GERD (gastroesophageal reflux disease)   . Heart murmur   . Hypertension    Past Surgical History:  Procedure Laterality Date  . cataract surgery Bilateral 1996  . EYE SURGERY  2000   detached L retina  . HERNIA REPAIR  1990  . NECK SURGERY     as a child   Family History   Problem Relation Age of Onset  . Heart disease Mother 78  . COPD Mother   . Crohn's disease Mother   . Heart disease Father 89  . Cancer Father        kidney  . Colon cancer Neg Hx   . Esophageal cancer Neg Hx   . Stomach cancer Neg Hx    Social History   Socioeconomic History  . Marital status: Legally Separated    Spouse name: Not on file  . Number of children: 2  . Years of education: Not on file  . Highest education level: Not on file  Occupational History  . Occupation: retired Pharmacist, hospital    Comment: part - time Pharmacist, hospital  Social Needs  . Financial resource strain: Not hard at all  . Food insecurity:    Worry: Never true    Inability: Never true  . Transportation needs:    Medical: No    Non-medical: No  Tobacco Use  . Smoking status: Never Smoker  . Smokeless tobacco: Never Used  Substance and Sexual Activity  . Alcohol use: Yes    Alcohol/week: 1.0 standard drinks    Types: 1 Glasses of wine per week    Comment: one or two glasses of wine a couple times a  week  . Drug use: No  . Sexual activity: Not on file  Lifestyle  . Physical activity:    Days per week: 3 days    Minutes per session: 30 min  . Stress: To some extent  Relationships  . Social connections:    Talks on phone: Three times a week    Gets together: Once a week    Attends religious service: More than 4 times per year    Active member of club or organization: No    Attends meetings of clubs or organizations: Never    Relationship status: Divorced  Other Topics Concern  . Not on file  Social History Narrative   04/17/18:       Lives alone in ranch home. Son lives locally but will be moving to [place] with family soon, which has been difficult for her to adjust to.   Started back up with exercising at [employer]    Outpatient Encounter Medications as of 04/17/2018  Medication Sig  . ALPRAZolam (XANAX) 0.25 MG tablet TAKE ONE TABLET BY MOUTH TWICE A DAY AS NEEDED. **NOTE: PER DR.  Elease Hashimoto, USE THESE SPARINGLY**  . aspirin 81 MG tablet Take 81 mg by mouth daily.  . Cholecalciferol (D3 ADULT PO) Take by mouth. daily  . esomeprazole (NEXIUM) 40 MG capsule TAKE ONE CAPSULE BY MOUTH TWICE A DAY AT 8 AM AND 10 PM  . lisinopril-hydrochlorothiazide (PRINZIDE,ZESTORETIC) 20-12.5 MG tablet Take 1 tablet by mouth daily.  Marland Kitchen saccharomyces boulardii (FLORASTOR) 250 MG capsule Take 250 mg by mouth 2 (two) times daily.  . tacrolimus (PROTOPIC) 0.1 % ointment   . triamcinolone cream (KENALOG) 0.1 % Apply 1 application topically 2 (two) times daily as needed.  . TURMERIC CURCUMIN PO Take by mouth daily.  . vitamin C (ASCORBIC ACID) 500 MG tablet Take 500 mg by mouth daily.  . [DISCONTINUED] doxycycline (VIBRAMYCIN) 100 MG capsule Take 1 capsule by mouth 2 (two) times daily.  . [DISCONTINUED] Misc Natural Products (GINKOGIN PO) Take by mouth.  . [DISCONTINUED] Multiple Vitamin (MULTIVITAMIN) tablet Take 1 tablet by mouth daily.   Facility-Administered Encounter Medications as of 04/17/2018  Medication  . 0.9 %  sodium chloride infusion    Activities of Daily Living In your present state of health, do you have any difficulty performing the following activities: 04/17/2018  Hearing? N  Vision? N  Difficulty concentrating or making decisions? N  Walking or climbing stairs? N  Dressing or bathing? N  Doing errands, shopping? N  Preparing Food and eating ? N  Using the Toilet? N  In the past six months, have you accidently leaked urine? N  Do you have problems with loss of bowel control? N  Managing your Medications? N  Managing your Finances? N  Housekeeping or managing your Housekeeping? N  Some recent data might be hidden    Patient Care Team: Eulas Post, MD as PCP - General (Family Medicine) Coy Saunas, Juanda Crumble, MD as Referring Physician (Dermatology) [place], Heather, [place] (Optometry) Irene Shipper, MD as Consulting Physician (Gastroenterology)    Assessment:    This is a routine wellness examination for Heidi Shah. Physical assessment deferred to PCP.   Exercise Activities and Dietary recommendations Current Exercise Habits: Structured exercise class, Type of exercise: walking;strength training/weights, Time (Minutes): 30, Frequency (Times/Week): 3, Weekly Exercise (Minutes/Week): 90, Intensity: Moderate, Exercise limited by: cardiac condition(s) Diet (meal preparation, eat out, water intake, caffeinated beverages, dairy products, fruits and vegetables): in general, a "healthy"  diet  . Noticed she had been eating high cholesterol foods like shrimp, and is trying to limit these. Cholesterol workbook provided for patient's review.     Goals    . Exercise 150 min/wk Moderate Activity     The beginning year, will start to go 3 days a week For at 60 minutes     . Patient Stated     Walk more, and get rash under control       Fall Risk Fall Risk  04/17/2018 02/17/2018 03/04/2017 02/14/2017 02/13/2016  Falls in the past year? 1 1 No No No  Number falls in past yr: 0 1 - - -  Injury with Fall? 1 1 - - -  Risk for fall due to : History of fall(s);Impaired balance/gait;Impaired vision - - - -  Follow up Education provided;Falls prevention discussed - - - -    Depression Screen PHQ 2/9 Scores 04/17/2018 02/17/2018 03/04/2017 02/14/2017  PHQ - 2 Score 0 0 0 0  PHQ- 9 Score 0 - - -     Cognitive Function MMSE - Mini Mental State Exam 03/04/2017  Not completed: (No Data)       Ad8 score reviewed for issues:  Issues making decisions: no  Less interest in hobbies / activities: no  Repeats questions, stories (family complaining): no  Trouble using ordinary gadgets (microwave, computer, phone):no  Forgets the month or year: no  Mismanaging finances: no  Remembering appts: no  Daily problems with thinking and/or memory: no Ad8 score is= 0  Immunization History  Administered Date(s) Administered  . Influenza, High Dose Seasonal PF  02/03/2015, 02/13/2016, 01/09/2017, 01/02/2018  . Influenza,inj,Quad PF,6+ Mos 12/27/2012, 12/26/2013  . Influenza-Unspecified 01/09/2017  . Pneumococcal Conjugate-13 01/01/2014  . Pneumococcal Polysaccharide-23 12/27/2012  . Td 08/27/2005  . Tdap 02/26/2016    Screening Tests Health Maintenance  Topic Date Due  . Hepatitis C Screening  02/12/2026 (Originally [birth year])  . PNA vac Low Risk Adult (2 of 2 - PPSV23) 04/12/2046 (Originally 12/27/2017)  . MAMMOGRAM  02/17/2020  . COLONOSCOPY  06/08/2020  . TETANUS/TDAP  02/25/2026  . INFLUENZA VACCINE  Completed  . DEXA SCAN  Completed       Plan:    Advance healthcare planning booklet provided; please review with family and bring with you to next appointment.  Follow up with Dr. Henrene Pastor about your reflux.  Refer to cholesterol-lowering education provided.    You have been set up with MyChart! Call us for any questions or concerns, especially if urgent.   Good luck with PT this week! Keep staying physically and mentally active!  I have personally reviewed and noted the following in the patient's chart:   . Medical and social history . Use of alcohol, tobacco or illicit drugs  . Current medications and supplements . Functional ability and status . Nutritional status . Physical activity . Advanced directives . List of other physicians . Vitals . Screenings to include cognitive, depression, and falls . Referrals and appointments  In addition, I have reviewed and discussed with patient certain preventive protocols, quality metrics, and best practice recommendations. A written personalized care plan for preventive services as well as general preventive health recommendations were provided to patient.     Alphia Moh, RN  04/17/2018  I have reviewed the documentation for the [facility] and [place] provided by the health coach and agree with their documentation. I was immediately available for any questions  Eulas Post MD [place]  Primary Care at [facility]

## 2018-04-20 ENCOUNTER — Other Ambulatory Visit: Payer: Self-pay

## 2018-04-20 ENCOUNTER — Ambulatory Visit: Payer: Medicare HMO

## 2018-04-20 ENCOUNTER — Ambulatory Visit (INDEPENDENT_AMBULATORY_CARE_PROVIDER_SITE_OTHER): Payer: Medicare HMO | Admitting: Physical Therapy

## 2018-04-20 ENCOUNTER — Encounter: Payer: Self-pay | Admitting: Physical Therapy

## 2018-04-20 DIAGNOSIS — Z9181 History of falling: Secondary | ICD-10-CM

## 2018-04-20 DIAGNOSIS — R2681 Unsteadiness on feet: Secondary | ICD-10-CM | POA: Diagnosis not present

## 2018-04-20 DIAGNOSIS — R293 Abnormal posture: Secondary | ICD-10-CM

## 2018-04-20 NOTE — Therapy (Signed)
[place], [place], [address] Phone: [phone]   Fax:  [phone]  Physical Therapy Evaluation  Patient Details  Name: Heidi Shah MRN: [id number] Date of Birth: Apr 13, 1945 Referring Provider (PT): Eulas Post, MD   Encounter Date: 04/20/2018  PT End of Session - 04/20/18 0854    Visit Number  1    Number of Visits  6    Date for PT Re-Evaluation  06/01/18    Authorization Type  [employer]    PT Start Time  0800    PT Stop Time  0843    PT Time Calculation (min)  43 min    Activity Tolerance  Patient tolerated treatment well    Behavior During Therapy  [facility] for tasks assessed/performed       Past Medical History:  Diagnosis Date  . Cataract    surgery  . Chicken pox   . Colon polyps   . Depression   . Diverticulitis   . Diverticulosis   . Esophageal stricture   . GERD (gastroesophageal reflux disease)   . Heart murmur   . Hypertension     Past Surgical History:  Procedure Laterality Date  . cataract surgery Bilateral 1996  . EYE SURGERY  2000   detached L retina  . HERNIA REPAIR  1990  . NECK SURGERY     as a child    There were no vitals filed for this visit.   Subjective Assessment - 04/20/18 0800    Subjective  Pt is a 73 y/o female who presents to OPPT for decreased balance and feeling of imbalance.  Pt reports decreased vision since age of 42, as well as back injury ~2-3 years ago with scoliosis affecting balance.    Pertinent History  scoliosis    Patient Stated Goals  walk straighter, improve balance    Pain Score  0-No pain         [facility] PT Assessment - 04/20/18 0802      Assessment   Medical Diagnosis  Z91.81 (ICD-10-CM) - At moderate risk for fall    Referring Provider (PT)  Eulas Post, MD    Onset Date/Surgical Date  --   2-3 years ago   Hand Dominance  Right    Next MD Visit  PRN    Prior Therapy  none      Precautions   Precautions  Fall      Restrictions   Weight Bearing Restrictions  No      Balance Screen   Has the patient fallen in the past 6 months  Yes    How many times?  2-tripped over daughter's dog and falling down steps when door knob fell off pulling on door    Has the patient had a decrease in activity level because of a fear of falling?   No   reports increased caution, especially on stairs   Is the patient reluctant to leave their home because of a fear of falling?   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 ([employer]     Leisure  reading, movies, spend time with friends; starting back into regular exercise (goal is gym 2-3x/wk)      Cognition   Overall Cognitive Status  Within Functional Limits for tasks assessed      Functional Tests   Functional tests  Single leg stance;Other      Single Leg Stance   Comments  Lt:4sec; Rt:7sec      Other:   Other/ Comments  EC on compliant surface: WNL with wide BOS; increased sway with feet together and LOB when attempting head turns      Posture/Postural Control   Posture/Postural Control  Postural limitations    Postural Limitations  Increased thoracic kyphosis;Rounded Shoulders;Forward head   scoliosis curve resulting in slight Lt lean at shoulders     ROM / Strength   AROM / PROM / Strength  Strength      Strength   Overall Strength Comments  tested in sitting    Strength Assessment Site  Hip;Knee;Ankle    Right/Left Hip  Right;Left    Right Hip Flexion  4/5    Left Hip Flexion  4/5    Right/Left Knee  Right;Left    Right Knee Flexion  5/5    Right  Knee Extension  5/5    Left Knee Flexion  5/5    Left Knee Extension  5/5    Right/Left Ankle  Right;Left    Right Ankle Dorsiflexion  5/5    Left Ankle Dorsiflexion  5/5   reports occasional tripping as fatigues     Standardized Balance Assessment   Standardized Balance Assessment  Berg Balance Test      Berg Balance Test   Sit to Stand  Able to stand without using hands and stabilize independently    Standing Unsupported  Able to stand safely 2 minutes    Sitting with Back Unsupported but Feet Supported on Floor or Stool  Able to sit safely and securely 2 minutes    Stand to Sit  Sits safely with minimal use of hands    Transfers  Able to transfer safely, minor use of hands    Standing Unsupported with Eyes Closed  Able to stand 10 seconds safely    Standing Ubsupported with Feet Together  Able to place feet together independently and stand 1 minute safely    From Standing, Reach Forward with Outstretched Arm  Can reach confidently >25 cm (10")    From Standing Position, Pick up Object from Floor  Able to pick up shoe safely and easily    From Standing Position, Turn to Look Behind Over each Shoulder  Looks behind from both sides and weight shifts well    Turn 360 Degrees  Able to turn 360 degrees safely in 4 seconds or less    Standing Unsupported, Alternately Place Feet on Step/Stool  Able to stand independently and safely and complete 8 steps in 20 seconds    Standing Unsupported, One Foot in Front  Able to plae foot ahead of the other independently and hold 30 seconds    Standing on One Leg  Able to lift leg independently and hold 5-10 seconds    Total Score  54      Functional Gait  Assessment   Gait assessed   Yes    Gait Level Surface  Walks 20 ft in  less than 5.5 sec, no assistive devices, good speed, no evidence for imbalance, normal gait pattern, deviates no more than 6 in outside of the 12 in walkway width.    Change in Gait Speed  Able to smoothly change walking speed  without loss of balance or gait deviation. Deviate no more than 6 in outside of the 12 in walkway width.    Gait with Horizontal Head Turns  Performs head turns smoothly with slight change in gait velocity (eg, minor disruption to smooth gait path), deviates 6-10 in outside 12 in walkway width, or uses an assistive device.    Gait with Vertical Head Turns  Performs task with slight change in gait velocity (eg, minor disruption to smooth gait path), deviates 6 - 10 in outside 12 in walkway width or uses assistive device    Gait and Pivot Turn  Pivot turns safely within 3 sec and stops quickly with no loss of balance.    Step Over Obstacle  Is able to step over one shoe box (4.5 in total height) but must slow down and adjust steps to clear box safely. May require verbal cueing.    Gait with Narrow Base of Support  Is able to ambulate for 10 steps heel to toe with no staggering.    Gait with Eyes Closed  Walks 20 ft, uses assistive device, slower speed, mild gait deviations, deviates 6-10 in outside 12 in walkway width. Ambulates 20 ft in less than 9 sec but greater than 7 sec.    Ambulating Backwards  Walks 20 ft, uses assistive device, slower speed, mild gait deviations, deviates 6-10 in outside 12 in walkway width.    Steps  Alternating feet, no rail.    Total Score  24                Objective measurements completed on examination: See above findings.      [facility] Adult PT Treatment/Exercise - 04/20/18 0850      Self-Care   Self-Care  Other Self-Care Comments    Other Self-Care Comments   educated on balance systems and how vision/glassess affecting negotiating stairs.  pt has trifocals and feel this is contributing to difficulty with descending stairs.  recommended heel contact first when descending and to try using single lenses to see if she notices a difference.  pt with decreased vestibular input and will address through balance exercises          Balance Exercises - 04/20/18  0849      Balance Exercises: Standing   Standing Eyes Closed  Head turns;Narrow base of support (BOS);Foam/compliant surface      [place] PROGRAM   Heel Walking  Support        PT Education - 04/20/18 [phone]    Education Details  HEP, see self care    Person(s) Educated  Patient    Methods  Explanation;Demonstration;Handout    Comprehension  Verbalized understanding;Returned demonstration;Need further instruction          PT Long Term Goals - 04/20/18 0906      PT LONG TERM GOAL #1   Title  independent with HEP    Status  New    Target Date  06/01/18      PT LONG TERM GOAL #2   Title  report no falls for improved mobility    Status  New    Target Date  06/01/18      PT LONG TERM GOAL #3   Title  demonstrate improved balance by standing >9 sec SLS    Status  New    Target Date  06/01/18      PT LONG TERM GOAL #4   Title  stand EC x 30 sec on compliant surface with feet together for improved balance    Status  New    Target Date  06/01/18             Plan - 04/20/18 0858    Clinical Impression Statement  Pt is a 73 y/o female who presents to OPPT for imbalance with history of 2 recent falls.  Pt demonstrates mild balance and strength deficits and postural abnormalities affecting functional mobility.  Pt will benefit from PT to address deficits listed.    Clinical Presentation  Stable    Clinical Decision Making  Low    Rehab Potential  Excellent    PT Frequency  1x / week    PT Duration  6 weeks    PT Treatment/Interventions  ADLs/Self Care Home Management;Moist Heat;Cryotherapy;Gait training;Stair training;Functional mobility training;Neuromuscular re-education;Balance training;Therapeutic exercise;Therapeutic activities;Patient/family education;Manual techniques;Taping;Vestibular    PT Next Visit Plan  review HEP and add posture exercises to HEP (including core)    PT Home Exercise Plan  Access Code: [email or web address]    Consulted and Agree with Plan of Care  Patient        Patient will benefit from skilled therapeutic intervention in order to improve the following deficits and impairments:  Abnormal gait, Decreased balance, Decreased strength, Postural dysfunction  Visit Diagnosis: Unsteadiness on feet - Plan: PT plan of care cert/re-cert  History of falling - Plan: PT plan of care cert/re-cert  Abnormal posture - Plan: PT plan of care cert/re-cert     Problem List Patient Active Problem List   Diagnosis Date Noted  . Unspecified constipation 03/05/2013  . GERD (gastroesophageal reflux disease) 01/19/2013  . Esophageal spasm 01/19/2013  . Dysphagia, unspecified(787.20) 01/19/2013  . Diverticulitis of colon (without mention of hemorrhage)(562.11) 11/23/2012  . History of depression 11/23/2012  . Essential hypertension, benign 11/23/2012      Laureen Abrahams, PT, DPT 04/20/18 9:10 AM     [facility] [address] [place], [place], [address] Phone: [phone]   Fax:  [phone]  Name: Heidi Shah MRN: [id number] Date of Birth: 1945/11/20

## 2018-04-20 NOTE — Patient Instructions (Signed)
Access Code: [email or web address]  URL: [email or web address]  Date: 04/20/2018  Prepared by: Faustino Congress   Exercises  Heel Walking - 4 reps - 1 sets - 10-15 feet - 2x daily - 7x weekly  Romberg Stance with Head Nods on Foam Pad - 10 reps - 1 sets - 2x daily - 7x weekly

## 2018-04-27 ENCOUNTER — Ambulatory Visit (INDEPENDENT_AMBULATORY_CARE_PROVIDER_SITE_OTHER): Payer: Medicare HMO | Admitting: Physical Therapy

## 2018-04-27 DIAGNOSIS — R293 Abnormal posture: Secondary | ICD-10-CM | POA: Diagnosis not present

## 2018-04-27 DIAGNOSIS — Z9181 History of falling: Secondary | ICD-10-CM | POA: Diagnosis not present

## 2018-04-27 DIAGNOSIS — R2681 Unsteadiness on feet: Secondary | ICD-10-CM | POA: Diagnosis not present

## 2018-04-27 NOTE — Therapy (Signed)
[place], [place], [address] Phone: [phone]   Fax:  [phone]  Physical Therapy Treatment  Patient Details  Name: Heidi Shah MRN: [id number] Date of Birth: 27-Sep-1945 Referring Provider (PT): Eulas Post, MD   Encounter Date: 04/27/2018  PT End of Session - 04/27/18 0814    Visit Number  2    Number of Visits  6    Date for PT Re-Evaluation  06/01/18    Authorization Type  [employer]    PT Start Time  0804    PT Stop Time  6269    PT Time Calculation (min)  43 min       Past Medical History:  Diagnosis Date  . Cataract    surgery  . Chicken pox   . Colon polyps   . Depression   . Diverticulitis   . Diverticulosis   . Esophageal stricture   . GERD (gastroesophageal reflux disease)   . Heart murmur   . Hypertension     Past Surgical History:  Procedure Laterality Date  . cataract surgery Bilateral 1996  . EYE SURGERY  2000   detached L retina  . HERNIA REPAIR  1990  . NECK SURGERY     as a child    There were no vitals filed for this visit.  Subjective Assessment - 04/27/18 0814    Subjective  Pt reports she is still looking for a medium to stand on for balance exercises.  She has been doing the heel walking with her grandson.  No falls since August.     Pertinent History  scoliosis    Patient Stated Goals  walk straighter, improve balance    Currently in Pain?  No/denies         [facility] PT Assessment - 04/27/18 0001      Assessment   Medical Diagnosis  Z91.81 (ICD-10-CM) - At moderate risk for fall    Referring Provider (PT)  Eulas Post, MD    Onset Date/Surgical Date  --   2-3 years ago   Hand Dominance  Right    Next MD Visit  PRN    Prior Therapy  none        [facility] Adult PT Treatment/Exercise - 04/27/18 0001      Exercises   Exercises  Shoulder      Shoulder Exercises: Standing   External Rotation  Strengthening;Both;10  reps;Theraband   2 sets; cues for form.    Theraband Level (Shoulder External Rotation)  Level 1 (Yellow)    Extension  Strengthening;Both;10 reps;Theraband   2 sets; cues for form.    Theraband Level (Shoulder Extension)  Level 1 (Yellow)    Row  Strengthening;Both;15 reps;Theraband   2 sets; cues for form.    Theraband Level (Shoulder Row)  Level 1 (Yellow)      Balance Exercises - 04/27/18 0839      Balance Exercises: Standing   Standing Eyes Closed  Narrow base of support (BOS);Foam/compliant surface;2 reps;20 secs;30 secs    SLS  Eyes open;Foam/compliant surface;Intermittent upper extremity support;2 reps;15 secs;Solid surface   3 sec on foam; up to 11 sec on solid surface.    Tandem Gait  Forward;Retro;3 reps;Intermittent upper extremity support    Sidestepping  Upper extremity support;2 reps   alternating heel and toe walking, 10 ft, at counter       PT Education - 04/27/18 1307    Education Details  updated HEP  Person(s) Educated  Patient    Methods  Explanation;Handout    Comprehension  Verbalized understanding          PT Long Term Goals - 04/20/18 0906      PT LONG TERM GOAL #1   Title  independent with HEP    Status  New    Target Date  06/01/18      PT LONG TERM GOAL #2   Title  report no falls for improved mobility    Status  New    Target Date  06/01/18      PT LONG TERM GOAL #3   Title  demonstrate improved balance by standing >9 sec SLS    Status  New    Target Date  06/01/18      PT LONG TERM GOAL #4   Title  stand EC x 30 sec on compliant surface with feet together for improved balance    Status  New    Target Date  06/01/18            Plan - 04/27/18 0833    Clinical Impression Statement  Pt able to complete SLS for 6-11 sec each leg on solid surface; added to HEP.  Pt tolerated all execises well, with some challenge in SLS. Progressing towards goals.      Rehab Potential  Excellent    PT Frequency  1x / week    PT Duration  6  weeks    PT Next Visit Plan  review HEP and progress as tolerated.     PT Home Exercise Plan  Access Code: [email or web address]    Consulted and Agree with Plan of Care  Patient       Patient will benefit from skilled therapeutic intervention in order to improve the following deficits and impairments:  Abnormal gait, Decreased balance, Decreased strength, Postural dysfunction  Visit Diagnosis: Unsteadiness on feet  History of falling  Abnormal posture     Problem List Patient Active Problem List   Diagnosis Date Noted  . Unspecified constipation 03/05/2013  . GERD (gastroesophageal reflux disease) 01/19/2013  . Esophageal spasm 01/19/2013  . Dysphagia, unspecified(787.20) 01/19/2013  . Diverticulitis of colon (without mention of hemorrhage)(562.11) 11/23/2012  . History of depression 11/23/2012  . Essential hypertension, benign 11/23/2012   Kerin Perna, PTA 04/27/18 1:22 PM  [facility] [place], [place], [address] Phone: [phone]   Fax:  [phone]  Name: Heidi Shah MRN: [id number] Date of Birth: 1946-02-21

## 2018-04-27 NOTE — Patient Instructions (Signed)
Access Code: [email or web address]  URL: [email or web address]  Date: 04/27/2018  Prepared by: Kerin Perna   Exercises  Heel Walking - 4 reps - 1 sets - 10-15 feet - 2x daily - 7x weekly  Toe Walking - 3 reps - 1 sets - 10-12 feet - 1x daily - 3-4x weekly  Romberg Stance with Head Nods on Foam Pad - 10 reps - 1 sets - 2x daily - 3-4x weekly  Backward Tandem Walking - 3 reps - 10-12 feet - 1x daily - 3-4x weekly  Single Leg Stance - 3 reps - 15-30 sec hold - 1x daily - 7x weekly  Shoulder External Rotation and Scapular Retraction with Resistance - 10 reps - 2-3 sets - 1x daily - 7x weekly  Scapular Retraction with Resistance - 10 reps - 2-3 sets - 1x daily - 7x weekly  Scapular Retraction with Resistance Advanced - 10 reps - 2-3 sets - 1x daily - 7x weekly

## 2018-05-04 ENCOUNTER — Ambulatory Visit (INDEPENDENT_AMBULATORY_CARE_PROVIDER_SITE_OTHER): Payer: Medicare HMO | Admitting: Physical Therapy

## 2018-05-04 ENCOUNTER — Encounter: Payer: Self-pay | Admitting: Physical Therapy

## 2018-05-04 DIAGNOSIS — Z9181 History of falling: Secondary | ICD-10-CM

## 2018-05-04 DIAGNOSIS — R293 Abnormal posture: Secondary | ICD-10-CM

## 2018-05-04 DIAGNOSIS — R2681 Unsteadiness on feet: Secondary | ICD-10-CM | POA: Diagnosis not present

## 2018-05-04 NOTE — Therapy (Signed)
[place], [place], [address] Phone: [phone]   Fax:  [phone]  Physical Therapy Treatment  Patient Details  Name: Heidi Shah MRN: [id number] Date of Birth: 25-Mar-1946 Referring Provider (PT): Eulas Post, MD   Encounter Date: 05/04/2018  PT End of Session - 05/04/18 0936    Visit Number  3    Number of Visits  6    Date for PT Re-Evaluation  06/01/18    Authorization Type  [employer]    PT Start Time  0804    PT Stop Time  0845    PT Time Calculation (min)  41 min    Activity Tolerance  Patient tolerated treatment well    Behavior During Therapy  [facility] for tasks assessed/performed       Past Medical History:  Diagnosis Date  . Cataract    surgery  . Chicken pox   . Colon polyps   . Depression   . Diverticulitis   . Diverticulosis   . Esophageal stricture   . GERD (gastroesophageal reflux disease)   . Heart murmur   . Hypertension     Past Surgical History:  Procedure Laterality Date  . cataract surgery Bilateral 1996  . EYE SURGERY  2000   detached L retina  . HERNIA REPAIR  1990  . NECK SURGERY     as a child    There were no vitals filed for this visit.  Subjective Assessment - 05/04/18 0811    Subjective  "I'm sore".  Pt reports she has been doing heel walk walking 15 ft at counter and 3 sets of 10 of band exercises.  She feels her balance is improving.  No further falls.     Patient Stated Goals  walk straighter, improve balance    Currently in Pain?  No/denies    Pain Score  0-No pain         [facility] PT Assessment - 05/04/18 0001      Assessment   Medical Diagnosis  Z91.81 (ICD-10-CM) - At moderate risk for fall    Referring Provider (PT)  Eulas Post, MD    Onset Date/Surgical Date  --   2-3 years ago   Hand Dominance  Right    Next MD Visit  PRN    Prior Therapy  none       [facility] Adult PT Treatment/Exercise - 05/04/18 0001      Knee/Hip Exercises: Standing   Other Standing Knee Exercises  heel/ toe walking Rt/Lt  side stepping at counter x 10 ft x 2 reps, cues for posture.       Shoulder Exercises: Standing   External Rotation  Strengthening;Both;10 reps;Theraband   2 sets; cues for form.    Theraband Level (Shoulder External Rotation)  Level 1 (Yellow)    Extension  Both;10 reps;Theraband    Theraband Level (Shoulder Extension)  Level 2 (Red)    Row  Strengthening;Both;10 reps;Theraband   cues for form   Theraband Level (Shoulder Row)  Level 2 (Red)      Shoulder Exercises: ROM/Strengthening   Nustep  L4: 5 min (arms/legs)      Shoulder Exercises: Stretch   Other Shoulder Stretches  3 position doorway stretch x 15 sec x 2 reps each arm.       Balance Exercises - 05/04/18 0827      Balance Exercises: Standing   Standing Eyes Closed  Foam/compliant surface;1 rep;30 secs;Narrow base of  support (BOS)    Tandem Stance  Foam/compliant surface;Intermittent upper extremity support;2 reps;20 secs;Eyes open    Other Standing Exercises  SLS on flat surface: Lt 15 sec, 10 sec/ Rt 5 sec, 24 sec.  SLS on foam Rt 4 sec, Lt 8 sec.               PT Long Term Goals - 04/20/18 0906      PT LONG TERM GOAL #1   Title  independent with HEP    Status  New    Target Date  06/01/18      PT LONG TERM GOAL #2   Title  report no falls for improved mobility    Status  New    Target Date  06/01/18      PT LONG TERM GOAL #3   Title  demonstrate improved balance by standing >9 sec SLS    Status  New    Target Date  06/01/18      PT LONG TERM GOAL #4   Title  stand EC x 30 sec on compliant surface with feet together for improved balance    Status  New    Target Date  06/01/18            Plan - 05/04/18 0934    Clinical Impression Statement  Pt demonstrated improved SLS and was able to stand with EC on compliant surface for 30 seconds; great progress towards established therapy goals.  All other exercises  for strengthening were tolerated well; pt issued band with greater resistance.     Rehab Potential  Excellent    PT Frequency  1x / week    PT Duration  6 weeks    PT Treatment/Interventions  ADLs/Self Care Home Management;Moist Heat;Cryotherapy;Gait training;Stair training;Functional mobility training;Neuromuscular re-education;Balance training;Therapeutic exercise;Therapeutic activities;Patient/family education;Manual techniques;Taping;Vestibular    PT Next Visit Plan  add LE stretches to program and assess readiness to d/c.     PT Home Exercise Plan  Access Code: [email or web address]    Consulted and Agree with Plan of Care  Patient       Patient will benefit from skilled therapeutic intervention in order to improve the following deficits and impairments:  Abnormal gait, Decreased balance, Decreased strength, Postural dysfunction  Visit Diagnosis: Unsteadiness on feet  History of falling  Abnormal posture     Problem List Patient Active Problem List   Diagnosis Date Noted  . Unspecified constipation 03/05/2013  . GERD (gastroesophageal reflux disease) 01/19/2013  . Esophageal spasm 01/19/2013  . Dysphagia, unspecified(787.20) 01/19/2013  . Diverticulitis of colon (without mention of hemorrhage)(562.11) 11/23/2012  . History of depression 11/23/2012  . Essential hypertension, benign 11/23/2012   Kerin Perna, PTA 05/04/18 1:34 PM  [facility] [place], [place], [address] Phone: [phone]   Fax:  [phone]  Name: Heidi Shah MRN: [id number] Date of Birth: 10/25/1945

## 2018-05-09 ENCOUNTER — Other Ambulatory Visit: Payer: Self-pay | Admitting: Family Medicine

## 2018-05-11 ENCOUNTER — Encounter: Payer: Self-pay | Admitting: Physical Therapy

## 2018-05-11 ENCOUNTER — Ambulatory Visit (INDEPENDENT_AMBULATORY_CARE_PROVIDER_SITE_OTHER): Payer: Medicare HMO | Admitting: Physical Therapy

## 2018-05-11 DIAGNOSIS — R293 Abnormal posture: Secondary | ICD-10-CM

## 2018-05-11 DIAGNOSIS — Z9181 History of falling: Secondary | ICD-10-CM | POA: Diagnosis not present

## 2018-05-11 DIAGNOSIS — R2681 Unsteadiness on feet: Secondary | ICD-10-CM

## 2018-05-11 NOTE — Patient Instructions (Signed)
Access Code: [email or web address]  URL: [email or web address]  Date: 05/11/2018  Prepared by: Kerin Perna   Exercises  Seated Hamstring Stretch - 3 reps - 30 hold - 2x daily - 7x weekly  Seated Piriformis Stretch - 3 reps - 30 hold - 2x daily - 7x weekly  Seated Thoracic Lumbar Extension with Pectoralis Stretch - 3 reps - 5-10 hold - 2x daily - 7x weekly  Seated Hip Flexor Stretch - 3 reps - 30 hold - 2x daily - 7x weekly  Quadriceps Stretch with Chair - 2-3 reps - 20 seconds hold - 2x daily - 7x weekly  Hooklying Hamstring Stretch with Strap - 2-3 reps - 20 seconds hold - 1x daily - 7x weekly  Supine Figure 4 Piriformis Stretch - 2-3 reps - 20-30 seconds hold - 1x daily - 7x weekly  Gastroc Stretch on Wall - 2-3 reps - 20-30 seconds hold - 1x daily - 7x weekly

## 2018-05-11 NOTE — Therapy (Addendum)
[place], [place], [address] Phone: [phone]   Fax:  [phone]  Physical Therapy Treatment/Discharge  Patient Details  Name: Heidi Shah MRN: [id number] Date of Birth: [birth year] Referring Provider (PT): Eulas Post, MD   Encounter Date: 05/11/2018  PT End of Session - 05/11/18 0847    Visit Number  4    Number of Visits  6    Date for PT Re-Evaluation  06/01/18    Authorization Type  [employer]    PT Start Time  0802    PT Stop Time  0840    PT Time Calculation (min)  38 min    Activity Tolerance  Patient tolerated treatment well    Behavior During Therapy  [facility] for tasks assessed/performed       Past Medical History:  Diagnosis Date  . Cataract    surgery  . Chicken pox   . Colon polyps   . Depression   . Diverticulitis   . Diverticulosis   . Esophageal stricture   . GERD (gastroesophageal reflux disease)   . Heart murmur   . Hypertension     Past Surgical History:  Procedure Laterality Date  . cataract surgery Bilateral 1996  . EYE SURGERY  2000   detached L retina  . HERNIA REPAIR  1990  . NECK SURGERY     as a child    There were no vitals filed for this visit.  Subjective Assessment - 05/11/18 0807    Subjective  Pt reports she has been doing her exercises 3x / wk and this is working out well.  She states she has difficulty with SLS on a soft surface, but she continues to work on it.  No further falls.     Patient Stated Goals  walk straighter, improve balance    Currently in Pain?  No/denies    Pain Score  0-No pain         [facility] PT Assessment - 05/11/18 0001      Assessment   Medical Diagnosis  Z91.81 (ICD-10-CM) - At moderate risk for fall    Referring Provider (PT)  Eulas Post, MD    Onset Date/Surgical Date  --   2-3 years ago   Hand Dominance  Right    Next MD Visit  PRN    Prior Therapy  none      [facility] Adult PT  Treatment/Exercise - 05/11/18 0001      Exercises   Exercises  Knee/Hip      Knee/Hip Exercises: Stretches   Passive Hamstring Stretch  Right;Left;3 reps;20 seconds    Passive Hamstring Stretch Limitations  seated and supine    Quad Stretch  Right;Left;1 rep;20 seconds   standing   Hip Flexor Stretch  Right;Left;2 reps;20 seconds    Piriformis Stretch  Right;Left;3 reps;20 seconds   seated and supine   Gastroc Stretch  Right;Left;1 rep;30 seconds      Knee/Hip Exercises: Aerobic   Nustep  NuStep L5: 5 min (arms/legs)      Shoulder Exercises: Standing   External Rotation  Both;10 reps;Theraband;Strengthening    Theraband Level (Shoulder External Rotation)  Level 3 (Green)    Extension  Both;10 reps;Theraband    Theraband Level (Shoulder Extension)  Level 3 (Green)    Row  Strengthening;Both;10 reps;Theraband   cues to slow down   Theraband Level (Shoulder Row)  Level 3 (Green)       Balance Exercises -  05/11/18 0852      Balance Exercises: Standing   Standing Eyes Closed  Foam/compliant surface;1 rep;30 secs;Narrow base of support (BOS)    SLS  Eyes open;Solid surface;3 reps;10 secs        PT Education - 05/11/18 0850    Education Details  updated HEP, issued green band      Person(s) Educated  Patient    Methods  Explanation;Demonstration;Verbal cues;Handout    Comprehension  Verbalized understanding;Returned demonstration          PT Long Term Goals - 05/11/18 9449      PT LONG TERM GOAL #1   Title  independent with HEP    Status  Achieved      PT LONG TERM GOAL #2   Title  report no falls for improved mobility    Status  Achieved      PT LONG TERM GOAL #3   Title  demonstrate improved balance by standing >9 sec SLS    Status  Achieved      PT LONG TERM GOAL #4   Title  stand EC x 30 sec on compliant surface with feet together for improved balance    Status  Achieved            Plan - 05/11/18 0848    Clinical Impression Statement  Added LE  stretches to pt's HEP to assist in injury prevention.  Pt's balance continues to improve.  She has not had any further falls.  Pt has met all goals and verbalized readiness to d/c at this time.     Rehab Potential  Excellent    PT Frequency  1x / week    PT Duration  6 weeks    PT Treatment/Interventions  ADLs/Self Care Home Management;Moist Heat;Cryotherapy;Gait training;Stair training;Functional mobility training;Neuromuscular re-education;Balance training;Therapeutic exercise;Therapeutic activities;Patient/family education;Manual techniques;Taping;Vestibular    PT Next Visit Plan  spoke to supervising PT; will d/c at this time.     PT Home Exercise Plan  Access Code: [email or web address]    Consulted and Agree with Plan of Care  Patient       Patient will benefit from skilled therapeutic intervention in order to improve the following deficits and impairments:  Abnormal gait, Decreased balance, Decreased strength, Postural dysfunction  Visit Diagnosis: Unsteadiness on feet  History of falling  Abnormal posture     Problem List Patient Active Problem List   Diagnosis Date Noted  . Unspecified constipation 03/05/2013  . GERD (gastroesophageal reflux disease) 01/19/2013  . Esophageal spasm 01/19/2013  . Dysphagia, unspecified(787.20) 01/19/2013  . Diverticulitis of colon (without mention of hemorrhage)(562.11) 11/23/2012  . History of depression 11/23/2012  . Essential hypertension, benign 11/23/2012   Kerin Perna, PTA 05/11/18 8:53 AM  [facility] [place], [place], [address] Phone: [phone]   Fax:  [phone]  Name: Heidi Shah MRN: [id number] Date of Birth: 1945/12/17      PHYSICAL THERAPY DISCHARGE SUMMARY  Visits from Start of Care: 4  Current functional level related to goals / functional outcomes: See above   Remaining deficits: See above   Education /  Equipment: HEP  Plan: Patient agrees to discharge.  Patient goals were met. Patient is being discharged due to meeting the stated rehab goals.  ?????    Laureen Abrahams, PT, DPT 05/16/18 7:46 AM  [facility] [place], [place] [address]  [phone] (office) [phone] (  fax)

## 2018-07-09 ENCOUNTER — Other Ambulatory Visit: Payer: Self-pay | Admitting: Family Medicine

## 2018-07-20 DIAGNOSIS — D225 Melanocytic nevi of trunk: Secondary | ICD-10-CM | POA: Diagnosis not present

## 2018-07-20 DIAGNOSIS — L821 Other seborrheic keratosis: Secondary | ICD-10-CM | POA: Diagnosis not present

## 2018-07-20 DIAGNOSIS — S70362A Insect bite (nonvenomous), left thigh, initial encounter: Secondary | ICD-10-CM | POA: Diagnosis not present

## 2018-07-20 DIAGNOSIS — L814 Other melanin hyperpigmentation: Secondary | ICD-10-CM | POA: Diagnosis not present

## 2018-07-21 DIAGNOSIS — H401131 Primary open-angle glaucoma, bilateral, mild stage: Secondary | ICD-10-CM | POA: Diagnosis not present

## 2018-09-22 ENCOUNTER — Telehealth: Payer: Self-pay

## 2018-09-22 NOTE — Telephone Encounter (Signed)
Copied from [place] [phone]. Topic: General - Other >> Sep 22, 2018 10:22 AM Rainey Pines A wrote: Patient stated that her new pharmacy will be [facility] mail order and wanted to make the office aware that [facility] will be contacting soon.

## 2018-10-03 ENCOUNTER — Other Ambulatory Visit: Payer: Self-pay

## 2018-10-03 ENCOUNTER — Telehealth: Payer: Self-pay

## 2018-10-03 DIAGNOSIS — K222 Esophageal obstruction: Secondary | ICD-10-CM

## 2018-10-03 DIAGNOSIS — R1310 Dysphagia, unspecified: Secondary | ICD-10-CM

## 2018-10-03 DIAGNOSIS — K219 Gastro-esophageal reflux disease without esophagitis: Secondary | ICD-10-CM

## 2018-10-03 MED ORDER — ESOMEPRAZOLE MAGNESIUM 40 MG PO CPDR: DELAYED_RELEASE_CAPSULE | ORAL | 6 refills | Status: DC

## 2018-10-03 NOTE — Telephone Encounter (Signed)
Phone screening complete 

## 2018-10-04 ENCOUNTER — Encounter: Payer: Self-pay | Admitting: Internal Medicine

## 2018-10-04 ENCOUNTER — Other Ambulatory Visit: Payer: Self-pay

## 2018-10-04 ENCOUNTER — Ambulatory Visit (INDEPENDENT_AMBULATORY_CARE_PROVIDER_SITE_OTHER): Payer: Medicare HMO | Admitting: Internal Medicine

## 2018-10-04 VITALS — Ht <= 58 in | Wt 110.0 lb

## 2018-10-04 DIAGNOSIS — R1310 Dysphagia, unspecified: Secondary | ICD-10-CM | POA: Diagnosis not present

## 2018-10-04 DIAGNOSIS — K222 Esophageal obstruction: Secondary | ICD-10-CM

## 2018-10-04 DIAGNOSIS — K219 Gastro-esophageal reflux disease without esophagitis: Secondary | ICD-10-CM | POA: Diagnosis not present

## 2018-10-04 DIAGNOSIS — R1319 Other dysphagia: Secondary | ICD-10-CM

## 2018-10-04 NOTE — Progress Notes (Signed)
HISTORY OF PRESENT ILLNESS:  Heidi Shah is a 73 y.o. female who schedules this WebEx audiovisual telehealth visit during the coronavirus pandemic regarding problems with her reflux disease, throat clearing, and dysphasia.  I last saw the patient October 28, 2017.  She has a history of chronic GERD and erosive esophagitis with peptic stricture requiring esophageal dilation.  At the time of her last visit she was continued on Nexium 40 mg daily with plans for routine follow-up in 1 year.  To contact the office at this time stating that she has been compliant with Nexium 40 mg once daily.  Despite this she will experience occasional indigestion for which an acids are needed.  This is not severe.  She does continue to have occasional mild dysphasia which is not progressive.  She does have a constant throat clearing behavior which concerns her.  This is been going on for at least 1 month.  GI review of systems is otherwise negative.  She does have a history of sessile serrated polyps of the colon for which follow-up is due around March 2022.  Review of outside laboratories from November 2019 finds normal hemoglobin of 13.9.  REVIEW OF SYSTEMS:  All non-GI ROS negative unless otherwise stated in the HPI   Past Medical History:  Diagnosis Date  . Cataract    surgery  . Chicken pox   . Colon polyps   . Depression   . Diverticulitis   . Diverticulosis   . Esophageal stricture   . GERD (gastroesophageal reflux disease)   . Heart murmur   . Hypertension     Past Surgical History:  Procedure Laterality Date  . cataract surgery Bilateral 1996  . EYE SURGERY  2000   detached L retina  . HERNIA REPAIR  1990  . NECK SURGERY     as a child    Social History [name]  reports that she has never smoked. She has never used smokeless tobacco. She reports current alcohol use of about 1.0 standard drinks of alcohol per week. She reports that she does not use drugs.  family history  includes COPD in her mother; Cancer in her father; Crohn's disease in her mother; Heart disease (age of onset: 57) in her mother; Heart disease (age of onset: 51) in her father.  Allergies  Allergen Reactions  . Beeswax     Per skin test from dermatologist  . Cetrimonium Chloride [Cetrimide]     Per skin test from dermatologist  . Methylisothiazolinone     Per skin test from dermatologist  . Neomycin Sulfate [Neomycin]     Per skin test from dermatologist  . Nickel Sulfate [Nickel]     Per skin test from dermatologist  . Penicillins   . Propolis     Per skin test from dermatologist       PHYSICAL EXAMINATION: Appears well in no acute distress.  Alert and oriented.  Cooperative.  Pleasant No additional physical examination information with telehealth visit  ASSESSMENT:  1.  Chronic GERD with erosive esophagitis and peptic stricture.  Experiencing breakthrough symptoms.  Some dysphasia which is stable.  She does not feel she requires dilation at this time.  Problems with chronic throat clearing may indeed be due to undertreated reflux 2.  History of peptic stricture 3.  Sessile serrated polyp on colonoscopy March 2017  PLAN:  1.  Reflux precautions 2.  PRESCRIBE NEXIUM 40 mg TWICE daily; #60; 11 refills.  Medication effects and side effects  reviewed 3.  OFFICE FOLLOW UP with me in 3 months.  If symptoms are persistent or worsen may require upper endoscopy.  She knows to contact the office in the interim for any questions or problems 4.  Surveillance colonoscopy around March 2022 This telehealth audiovisual WebEx appointment was scheduled by and consented for by the patient who was in her home while I was in my office during the encounter.  She understands her may be associated professional charge for this service which totaled 16 minutes in duration

## 2018-10-04 NOTE — Patient Instructions (Addendum)
1.  Reflux precautions  2.  PRESCRIBE NEXIUM 40 mg TWICE daily; #60; 11 refills.  Medication effects and side effects reviewed  3.  OFFICE FOLLOW UP with me in 3 months.  Dr. Blanch Media schedule does not go out that far so please call the office next month to schedule this.  If symptoms are persistent or worsen may require upper endoscopy.  Please contact the office in the interim for any questions or problems  4.  Surveillance colonoscopy around March 2022

## 2018-10-07 ENCOUNTER — Other Ambulatory Visit: Payer: Self-pay | Admitting: Family Medicine

## 2018-10-09 ENCOUNTER — Telehealth: Payer: Self-pay | Admitting: Internal Medicine

## 2018-10-09 NOTE — Telephone Encounter (Signed)
patient called said that Heidi Shah has not received the okay to send in her medication NEXIUM. [employer] ph# [phone]

## 2018-10-09 NOTE — Telephone Encounter (Signed)
Sent prior authorization for generic Nexium to [facility].  Sent patient mychart message to let her know.

## 2018-10-10 ENCOUNTER — Ambulatory Visit (INDEPENDENT_AMBULATORY_CARE_PROVIDER_SITE_OTHER): Payer: Medicare HMO | Admitting: Family Medicine

## 2018-10-10 ENCOUNTER — Other Ambulatory Visit: Payer: Self-pay

## 2018-10-10 ENCOUNTER — Encounter: Payer: Self-pay | Admitting: Family Medicine

## 2018-10-10 VITALS — BP 110/74 | HR 91 | Temp 98.0°F | Ht <= 58 in | Wt 111.1 lb

## 2018-10-10 DIAGNOSIS — M542 Cervicalgia: Secondary | ICD-10-CM

## 2018-10-10 NOTE — Progress Notes (Signed)
Subjective:     Patient ID: Heidi Shah, female   DOB: 08-23-1945, 73 y.o.   MRN: [id number]  HPI  Patient is seen with left-sided neck pain with onset around March.  She denies any specific injury.  She does carry her pocketbook mostly on that side.  She is had occasional tingling left upper extremity.  She has some stiffness and soreness and achiness in the trapezius muscle.  Is gotten massages in the past but not recently.  Her pain is worse with movement of the neck.  She is not aware of any upper extremity weakness.  She takes ibuprofen but tries to take only infrequently.  Past Medical History:  Diagnosis Date  . Cataract    surgery  . Chicken pox   . Colon polyps   . Depression   . Diverticulitis   . Diverticulosis   . Esophageal stricture   . GERD (gastroesophageal reflux disease)   . Heart murmur   . Hypertension    Past Surgical History:  Procedure Laterality Date  . cataract surgery Bilateral 1996  . EYE SURGERY  2000   detached L retina  . HERNIA REPAIR  1990  . NECK SURGERY     as a child    reports that she has never smoked. She has never used smokeless tobacco. She reports current alcohol use of about 1.0 standard drinks of alcohol per week. She reports that she does not use drugs. family history includes COPD in her mother; Cancer in her father; Crohn's disease in her mother; Heart disease (age of onset: 38) in her mother; Heart disease (age of onset: 25) in her father. Allergies  Allergen Reactions  . Beeswax     Per skin test from dermatologist  . Cetrimonium Chloride [Cetrimide]     Per skin test from dermatologist  . Methylisothiazolinone     Per skin test from dermatologist  . Neomycin Sulfate [Neomycin]     Per skin test from dermatologist  . Nickel Sulfate [Nickel]     Per skin test from dermatologist  . Penicillins   . Propolis     Per skin test from dermatologist    Review of Systems  Constitutional: Negative for fever.   Respiratory: Negative for shortness of breath.   Cardiovascular: Negative for chest pain.  Neurological: Negative for weakness and numbness.       Objective:   Physical Exam Constitutional:      Appearance: Normal appearance.  Neck:     Musculoskeletal: Neck supple. No neck rigidity.     Comments: She does have some palpable muscle tension and soreness left trapezius compared with right Cardiovascular:     Rate and Rhythm: Normal rate and regular rhythm.  Pulmonary:     Effort: Pulmonary effort is normal.     Breath sounds: Normal breath sounds.  Musculoskeletal:     Comments: She has some pain with neck extension and also lateral bending and rotation especially to the right side  Lymphadenopathy:     Cervical: No cervical adenopathy.  Neurological:     Mental Status: She is alert.     Comments: Symmetric reflexes upper extremities and full strength throughout        Assessment:     Left-sided neck pain.  Differential is muscular versus cervical spondylosis versus possible bulging disc.  Nonfocal neuro exam    Plan:     -We recommended conservative therapies with heat, muscle massage, topical sports cream -Avoid regular use of non-steroidals -  Patient will try some massage therapy and if not improved with that which she will be in touch over the next couple weeks to consider imaging and further evaluation  Eulas Post MD [facility] at [facility]

## 2018-10-10 NOTE — Telephone Encounter (Signed)
[facility] is already on pharmacy list for patient.

## 2018-10-10 NOTE — Patient Instructions (Signed)
Try some topical heat and consider topical sports creams such as Biofreeze or Icy Hot  Consider muscle massage  We can look at getting X-ray if not better with the above.

## 2018-10-13 NOTE — Telephone Encounter (Signed)
Prior authorization approved

## 2018-11-09 DIAGNOSIS — D3131 Benign neoplasm of right choroid: Secondary | ICD-10-CM | POA: Diagnosis not present

## 2018-11-09 DIAGNOSIS — Z0100 Encounter for examination of eyes and vision without abnormal findings: Secondary | ICD-10-CM | POA: Diagnosis not present

## 2018-11-09 DIAGNOSIS — H59812 Chorioretinal scars after surgery for detachment, left eye: Secondary | ICD-10-CM | POA: Diagnosis not present

## 2018-11-09 DIAGNOSIS — Z961 Presence of intraocular lens: Secondary | ICD-10-CM | POA: Diagnosis not present

## 2018-11-09 DIAGNOSIS — H401131 Primary open-angle glaucoma, bilateral, mild stage: Secondary | ICD-10-CM | POA: Diagnosis not present

## 2018-11-09 DIAGNOSIS — H47323 Drusen of optic disc, bilateral: Secondary | ICD-10-CM | POA: Diagnosis not present

## 2018-11-09 DIAGNOSIS — H5053 Vertical heterophoria: Secondary | ICD-10-CM | POA: Diagnosis not present

## 2018-12-07 DIAGNOSIS — H401131 Primary open-angle glaucoma, bilateral, mild stage: Secondary | ICD-10-CM | POA: Diagnosis not present

## 2019-01-10 DIAGNOSIS — H1045 Other chronic allergic conjunctivitis: Secondary | ICD-10-CM | POA: Diagnosis not present

## 2019-01-15 ENCOUNTER — Other Ambulatory Visit: Payer: Self-pay | Admitting: Family Medicine

## 2019-01-15 DIAGNOSIS — Z1231 Encounter for screening mammogram for malignant neoplasm of breast: Secondary | ICD-10-CM

## 2019-02-08 DIAGNOSIS — H1045 Other chronic allergic conjunctivitis: Secondary | ICD-10-CM | POA: Diagnosis not present

## 2019-02-16 ENCOUNTER — Other Ambulatory Visit: Payer: Self-pay

## 2019-02-19 ENCOUNTER — Encounter: Payer: Self-pay | Admitting: Family Medicine

## 2019-02-19 ENCOUNTER — Ambulatory Visit (INDEPENDENT_AMBULATORY_CARE_PROVIDER_SITE_OTHER): Payer: Medicare HMO | Admitting: Family Medicine

## 2019-02-19 ENCOUNTER — Other Ambulatory Visit: Payer: Self-pay

## 2019-02-19 VITALS — BP 118/78 | HR 73 | Temp 98.2°F | Ht <= 58 in | Wt 103.1 lb

## 2019-02-19 DIAGNOSIS — Z0000 Encounter for general adult medical examination without abnormal findings: Secondary | ICD-10-CM

## 2019-02-19 LAB — CBC WITH DIFFERENTIAL/PLATELET
Basophils Absolute: 0.1 10*3/uL (ref 0.0–0.1)
Basophils Relative: 1.8 % (ref 0.0–3.0)
Eosinophils Absolute: 0.2 10*3/uL (ref 0.0–0.7)
Eosinophils Relative: 4 % (ref 0.0–5.0)
HCT: 38.7 % (ref 36.0–46.0)
Hemoglobin: 13.1 g/dL (ref 12.0–15.0)
Lymphocytes Relative: 48.5 % — ABNORMAL HIGH (ref 12.0–46.0)
Lymphs Abs: 2.1 10*3/uL (ref 0.7–4.0)
MCHC: 33.8 g/dL (ref 30.0–36.0)
MCV: 94.2 fl (ref 78.0–100.0)
Monocytes Absolute: 0.5 10*3/uL (ref 0.1–1.0)
Monocytes Relative: 10.9 % (ref 3.0–12.0)
Neutro Abs: 1.5 10*3/uL (ref 1.4–7.7)
Neutrophils Relative %: 34.8 % — ABNORMAL LOW (ref 43.0–77.0)
Platelets: 324 10*3/uL (ref 150.0–400.0)
RBC: 4.11 Mil/uL (ref 3.87–5.11)
RDW: 12.7 % (ref 11.5–15.5)
WBC: 4.3 10*3/uL (ref 4.0–10.5)

## 2019-02-19 LAB — HEPATIC FUNCTION PANEL
ALT: 11 U/L (ref 0–35)
AST: 20 U/L (ref 0–37)
Albumin: 4.2 g/dL (ref 3.5–5.2)
Alkaline Phosphatase: 42 U/L (ref 39–117)
Bilirubin, Direct: 0.1 mg/dL (ref 0.0–0.3)
Total Bilirubin: 0.6 mg/dL (ref 0.2–1.2)
Total Protein: 6.6 g/dL (ref 6.0–8.3)

## 2019-02-19 LAB — TSH: TSH: 1.06 u[IU]/mL (ref 0.35–4.50)

## 2019-02-19 LAB — BASIC METABOLIC PANEL
BUN: 11 mg/dL (ref 6–23)
CO2: 31 mEq/L (ref 19–32)
Calcium: 9.7 mg/dL (ref 8.4–10.5)
Chloride: 96 mEq/L (ref 96–112)
Creatinine, Ser: 0.57 mg/dL (ref 0.40–1.20)
GFR: 104.55 mL/min (ref >60.00)
Glucose, Bld: 84 mg/dL (ref 70–99)
Potassium: 4.1 mEq/L (ref 3.5–5.1)
Sodium: 135 mEq/L (ref 135–145)

## 2019-02-19 LAB — LIPID PANEL
Cholesterol: 261 mg/dL — ABNORMAL HIGH (ref 0–200)
HDL: 88.9 mg/dL (ref >39.00)
LDL Cholesterol: 161 mg/dL — ABNORMAL HIGH (ref 0–99)
NonHDL: 172.42
Total CHOL/HDL Ratio: 3
Triglycerides: 57 mg/dL (ref 0.0–149.0)
VLDL: 11.4 mg/dL (ref 0.0–40.0)

## 2019-02-19 NOTE — Progress Notes (Signed)
Subjective:     Patient ID: Heidi Shah, female   DOB: 1945-12-06, 73 y.o.   MRN: [id number]  HPI   Heidi Shah is seen for physical exam.  She has lost about 30 pounds this year due to her efforts.  She is walking 5 days/week up to 2 hours/day and has greatly restricted overall calories.  She has good appetite and feels well.  Her goal weight was around her current weight.  She has noticed great improvement in previous reflux symptoms.  She has not had any dizziness or orthostatic symptoms.  She does have past history of hypertension.  Health maintenance reviewed  -Flu vaccine already given -She has mammogram scheduled -Colonoscopy due 2022 -Tetanus due 2027 -She has scheduled repeat DEXA scan -She states she had previous Zostavax but no history of Shingrix  Past Medical History:  Diagnosis Date  . Cataract    surgery  . Chicken pox   . Colon polyps   . Depression   . Diverticulitis   . Diverticulosis   . Esophageal stricture   . GERD (gastroesophageal reflux disease)   . Heart murmur   . Hypertension    Past Surgical History:  Procedure Laterality Date  . cataract surgery Bilateral 1996  . EYE SURGERY  2000   detached L retina  . HERNIA REPAIR  1990  . NECK SURGERY     as a child    reports that she has never smoked. She has never used smokeless tobacco. She reports current alcohol use of about 1.0 standard drinks of alcohol per week. She reports that she does not use drugs. family history includes COPD in her mother; Cancer in her father; Crohn's disease in her mother; Heart disease (age of onset: 29) in her mother; Heart disease (age of onset: 1) in her father. Allergies  Allergen Reactions  . Beeswax     Per skin test from dermatologist  . Cetrimonium Chloride [Cetrimide]     Per skin test from dermatologist  . Methylisothiazolinone     Per skin test from dermatologist  . Neomycin Sulfate [Neomycin]     Per skin test from dermatologist  . Nickel Sulfate  [Nickel]     Per skin test from dermatologist  . Penicillins   . Propolis     Per skin test from dermatologist     Review of Systems  Constitutional: Negative for activity change, appetite change, fatigue, fever and unexpected weight change.  HENT: Negative for ear pain, hearing loss, sore throat and trouble swallowing.   Eyes: Negative for visual disturbance.  Respiratory: Negative for cough and shortness of breath.   Cardiovascular: Negative for chest pain and palpitations.  Gastrointestinal: Negative for abdominal pain, blood in stool, constipation and diarrhea.  Genitourinary: Negative for dysuria and hematuria.  Musculoskeletal: Negative for arthralgias, back pain and myalgias.  Skin: Negative for rash.  Neurological: Negative for dizziness, syncope and headaches.  Hematological: Negative for adenopathy.  Psychiatric/Behavioral: Negative for confusion and dysphoric mood.       Objective:   Physical Exam Constitutional:      Appearance: She is well-developed.  HENT:     Head: Normocephalic and atraumatic.  Eyes:     Pupils: Pupils are equal, round, and reactive to light.  Neck:     Musculoskeletal: Normal range of motion and neck supple.     Thyroid: No thyromegaly.  Cardiovascular:     Rate and Rhythm: Normal rate and regular rhythm.     Heart sounds: Normal  heart sounds. No murmur.  Pulmonary:     Effort: No respiratory distress.     Breath sounds: Normal breath sounds. No wheezing or rales.  Abdominal:     General: Bowel sounds are normal. There is no distension.     Palpations: Abdomen is soft. There is no mass.     Tenderness: There is no abdominal tenderness. There is no guarding or rebound.  Musculoskeletal: Normal range of motion.  Lymphadenopathy:     Cervical: No cervical adenopathy.  Skin:    Findings: No rash.  Neurological:     Mental Status: She is alert and oriented to person, place, and time.     Cranial Nerves: No cranial nerve deficit.      Deep Tendon Reflexes: Reflexes normal.  Psychiatric:        Behavior: Behavior normal.        Thought Content: Thought content normal.        Judgment: Judgment normal.        Assessment:     Physical exam.  Patient has hypertension which is well controlled.  She has history of reflux currently stable off her proton pump inhibitor.    Plan:     -Discussed Shingrix vaccine and she will check on insurance coverage -Mammogram and DEXA scan as above scheduled -Recommend she try to get up calcium 1200 mg daily and vitamin D 1000 international units daily -Continue regular weightbearing exercise -Patient requesting screening labs and these will be obtained  Eulas Post MD [facility] at [facility]

## 2019-02-19 NOTE — Patient Instructions (Signed)
Preventive Care 38 Years and Older, Female Preventive care refers to lifestyle choices and visits with your health care provider that can promote health and wellness. This includes:  A yearly physical exam. This is also called an annual well check.  Regular dental and eye exams.  Immunizations.  Screening for certain conditions.  Healthy lifestyle choices, such as diet and exercise. What can I expect for my preventive care visit? Physical exam Your health care provider will check:  Height and weight. These may be used to calculate body mass index (BMI), which is a measurement that tells if you are at a healthy weight.  Heart rate and blood pressure.  Your skin for abnormal spots. Counseling Your health care provider may ask you questions about:  Alcohol, tobacco, and drug use.  Emotional well-being.  Home and relationship well-being.  Sexual activity.  Eating habits.  History of falls.  Memory and ability to understand (cognition).  Work and work Statistician.  Pregnancy and menstrual history. What immunizations do I need?  Influenza (flu) vaccine  This is recommended every year. Tetanus, diphtheria, and pertussis (Tdap) vaccine  You may need a Td booster every 10 years. Varicella (chickenpox) vaccine  You may need this vaccine if you have not already been vaccinated. Zoster (shingles) vaccine  You may need this after age 71. Pneumococcal conjugate (PCV13) vaccine  One dose is recommended after age 71. Pneumococcal polysaccharide (PPSV23) vaccine  One dose is recommended after age 71. Measles, mumps, and rubella (MMR) vaccine  You may need at least one dose of MMR if you were born in 1957 or later. You may also need a second dose. Meningococcal conjugate (MenACWY) vaccine  You may need this if you have certain conditions. Hepatitis A vaccine  You may need this if you have certain conditions or if you travel or work in places where you may be exposed  to hepatitis A. Hepatitis B vaccine  You may need this if you have certain conditions or if you travel or work in places where you may be exposed to hepatitis B. Haemophilus influenzae type b (Hib) vaccine  You may need this if you have certain conditions. You may receive vaccines as individual doses or as more than one vaccine together in one shot (combination vaccines). Talk with your health care provider about the risks and benefits of combination vaccines. What tests do I need? Blood tests  Lipid and cholesterol levels. These may be checked every 5 years, or more frequently depending on your overall health.  Hepatitis C test.  Hepatitis B test. Screening  Lung cancer screening. You may have this screening every year starting at age 71 if you have a 30-pack-year history of smoking and currently smoke or have quit within the past 15 years.  Colorectal cancer screening. All adults should have this screening starting at age 71 and continuing until age 15. Your health care provider may recommend screening at age 23 if you are at increased risk. You will have tests every 1-10 years, depending on your results and the type of screening test.  Diabetes screening. This is done by checking your blood sugar (glucose) after you have not eaten for a while (fasting). You may have this done every 1-3 years.  Mammogram. This may be done every 1-2 years. Talk with your health care provider about how often you should have regular mammograms.  BRCA-related cancer screening. This may be done if you have a family history of breast, ovarian, tubal, or peritoneal cancers.  Other tests  Sexually transmitted disease (STD) testing.  Bone density scan. This is done to screen for osteoporosis. You may have this done starting at age 71. Follow these instructions at home: Eating and drinking  Eat a diet that includes fresh fruits and vegetables, whole grains, lean protein, and low-fat dairy products. Limit  your intake of foods with high amounts of sugar, saturated fats, and salt.  Take vitamin and mineral supplements as recommended by your health care provider.  Do not drink alcohol if your health care provider tells you not to drink.  If you drink alcohol: ? Limit how much you have to 0-1 drink a day. ? Be aware of how much alcohol is in your drink. In the [place] one drink equals one 12 oz bottle of beer (355 mL), one 5 oz glass of wine (148 mL), or one 1 oz glass of hard liquor (44 mL). Lifestyle  Take daily care of your teeth and gums.  Stay active. Exercise for at least 30 minutes on 5 or more days each week.  Do not use any products that contain nicotine or tobacco, such as cigarettes, e-cigarettes, and chewing tobacco. If you need help quitting, ask your health care provider.  If you are sexually active, practice safe sex. Use a condom or other form of protection in order to prevent STIs (sexually transmitted infections).  Talk with your health care provider about taking a low-dose aspirin or statin. What's next?  Go to your health care provider once a year for a well check visit.  Ask your health care provider how often you should have your eyes and teeth checked.  Stay up to date on all vaccines. This information is not intended to replace advice given to you by your health care provider. Make sure you discuss any questions you have with your health care provider. Document Released: 04/11/2015 Document Revised: 03/09/2018 Document Reviewed: 03/09/2018 Elsevier Patient Education  2020 [employer].

## 2019-02-23 ENCOUNTER — Encounter: Payer: Medicare PPO | Admitting: Family Medicine

## 2019-03-05 ENCOUNTER — Ambulatory Visit: Payer: Medicare HMO

## 2019-03-27 DIAGNOSIS — H401131 Primary open-angle glaucoma, bilateral, mild stage: Secondary | ICD-10-CM | POA: Diagnosis not present

## 2019-04-03 ENCOUNTER — Ambulatory Visit
Admission: RE | Admit: 2019-04-03 | Discharge: 2019-04-03 | Disposition: A | Discharge status: Home / Self Care | Payer: Medicare HMO | Source: Ambulatory Visit | Attending: Family Medicine | Admitting: Family Medicine

## 2019-04-03 ENCOUNTER — Other Ambulatory Visit: Payer: Self-pay

## 2019-04-03 DIAGNOSIS — Z13820 Encounter for screening for osteoporosis: Secondary | ICD-10-CM

## 2019-04-03 DIAGNOSIS — M810 Age-related osteoporosis without current pathological fracture: Secondary | ICD-10-CM | POA: Diagnosis not present

## 2019-04-03 DIAGNOSIS — Z780 Asymptomatic menopausal state: Secondary | ICD-10-CM | POA: Diagnosis not present

## 2019-04-04 ENCOUNTER — Telehealth (INDEPENDENT_AMBULATORY_CARE_PROVIDER_SITE_OTHER): Payer: Medicare HMO | Admitting: Family Medicine

## 2019-04-04 DIAGNOSIS — M810 Age-related osteoporosis without current pathological fracture: Secondary | ICD-10-CM | POA: Diagnosis not present

## 2019-04-04 DIAGNOSIS — K219 Gastro-esophageal reflux disease without esophagitis: Secondary | ICD-10-CM

## 2019-04-04 NOTE — Patient Instructions (Signed)
Osteoporosis  Osteoporosis is thinning and loss of density in your bones. Osteoporosis makes bones more brittle and fragile and more likely to break (fracture). Over time, osteoporosis can cause your bones to become so weak that they fracture after a minor fall. Bones in the hip, wrist, and spine are most likely to fracture due to osteoporosis. What are the causes? The exact cause of this condition is not known. What increases the risk? You may be at greater risk for osteoporosis if you:  Have a family history of the condition.  Have poor nutrition.  Use steroid medicines, such as prednisone.  Are female.  Are age 72 or older.  Smoke or have a history of smoking.  Are not physically active (are sedentary).  Are white (Caucasian) or of Asian descent.  Have a small body frame.  Take certain medicines, such as antiseizure medicines. What are the signs or symptoms? A fracture might be the first sign of osteoporosis, especially if the fracture results from a fall or injury that usually would not cause a bone to break. Other signs and symptoms include:  Pain in the neck or low back.  Stooped posture.  Loss of height. How is this diagnosed? This condition may be diagnosed based on:  Your medical history.  A physical exam.  A bone mineral density test, also called a DXA or DEXA test (dual-energy X-ray absorptiometry test). This test uses X-rays to measure the amount of minerals in your bones. How is this treated? The goal of treatment is to strengthen your bones and lower your risk for a fracture. Treatment may involve:  Making lifestyle changes, such as: ? Including foods with more calcium and vitamin D in your diet. ? Doing weight-bearing and muscle-strengthening exercises. ? Stopping tobacco use. ? Limiting alcohol intake.  Taking medicine to slow the process of bone loss or to increase bone density.  Taking daily supplements of calcium and vitamin D.  Taking  hormone replacement medicines, such as estrogen for women and testosterone for men.  Monitoring your levels of calcium and vitamin D. Follow these instructions at home:  Activity  Exercise as told by your health care provider. Ask your health care provider what exercises and activities are safe for you. You should do: ? Exercises that make you work against gravity (weight-bearing exercises), such as tai chi, yoga, or walking. ? Exercises to strengthen muscles, such as lifting weights. Lifestyle  Limit alcohol intake to no more than 1 drink a day for nonpregnant women and 2 drinks a day for men. One drink equals 12 oz of beer, 5 oz of wine, or 1 oz of hard liquor.  Do not use any products that contain nicotine or tobacco, such as cigarettes and e-cigarettes. If you need help quitting, ask your health care provider. Preventing falls  Use devices to help you move around (mobility aids) as needed, such as canes, walkers, scooters, or crutches.  Keep rooms well-lit and clutter-free.  Remove tripping hazards from walkways, including cords and throw rugs.  Install grab bars in bathrooms and safety rails on stairs.  Use rubber mats in the bathroom and other areas that are often wet or slippery.  Wear closed-toe shoes that fit well and support your feet. Wear shoes that have rubber soles or low heels.  Review your medicines with your health care provider. Some medicines can cause dizziness or changes in blood pressure, which can increase your risk of falling. General instructions  Include calcium and vitamin D in  your diet. Calcium is important for bone health, and vitamin D helps your body to absorb calcium. Good sources of calcium and vitamin D include: ? Certain fatty fish, such as salmon and tuna. ? Products that have calcium and vitamin D added to them (fortified products), such as fortified cereals. ? Egg yolks. ? Cheese. ? Liver.  Take over-the-counter and prescription medicines  only as told by your health care provider.  Keep all follow-up visits as told by your health care provider. This is important. Contact a health care provider if:  You have never been screened for osteoporosis and you are: ? A woman who is age 72 or older. ? A man who is age 85 or older. Get help right away if:  You fall or injure yourself. Summary  Osteoporosis is thinning and loss of density in your bones. This makes bones more brittle and fragile and more likely to break (fracture),even with minor falls.  The goal of treatment is to strengthen your bones and reduce your risk for a fracture.  Include calcium and vitamin D in your diet. Calcium is important for bone health, and vitamin D helps your body to absorb calcium.  Talk with your health care provider about screening for osteoporosis if you are a woman who is age 72 or older, or a man who is age 72 or older. This information is not intended to replace advice given to you by your health care provider. Make sure you discuss any questions you have with your health care provider. Document Revised: 02/25/2017 Document Reviewed: 01/07/2017 Elsevier Patient Education  [place]. Denosumab injection What is this medicine? DENOSUMAB (den oh sue mab) slows bone breakdown. Prolia is used to treat osteoporosis in women after menopause and in men, and in people who are taking corticosteroids for 6 months or more. Delton See is used to treat a high calcium level due to cancer and to prevent bone fractures and other bone problems caused by multiple myeloma or cancer bone metastases. Delton See is also used to treat giant cell tumor of the bone. This medicine may be used for other purposes; ask your health care provider or pharmacist if you have questions. COMMON BRAND NAME(S): Prolia, XGEVA What should I tell my health care provider before I take this medicine? They need to know if you have any of these conditions:  dental disease  having  surgery or tooth extraction  infection  kidney disease  low levels of calcium or Vitamin D in the blood  malnutrition  on hemodialysis  skin conditions or sensitivity  thyroid or parathyroid disease  an unusual reaction to denosumab, other medicines, foods, dyes, or preservatives  pregnant or trying to get pregnant  breast-feeding How should I use this medicine? This medicine is for injection under the skin. It is given by a health care professional in a hospital or clinic setting. A special MedGuide will be given to you before each treatment. Be sure to read this information carefully each time. For Prolia, talk to your pediatrician regarding the use of this medicine in children. Special care may be needed. For Delton See, talk to your pediatrician regarding the use of this medicine in children. While this drug may be prescribed for children as young as 13 years for selected conditions, precautions do apply. Overdosage: If you think you have taken too much of this medicine contact a poison control center or emergency room at once. NOTE: This medicine is only for you. Do not share this medicine  with others. What if I miss a dose? It is important not to miss your dose. Call your doctor or health care professional if you are unable to keep an appointment. What may interact with this medicine? Do not take this medicine with any of the following medications:  other medicines containing denosumab This medicine may also interact with the following medications:  medicines that lower your chance of fighting infection  steroid medicines like prednisone or cortisone This list may not describe all possible interactions. Give your health care provider a list of all the medicines, herbs, non-prescription drugs, or dietary supplements you use. Also tell them if you smoke, drink alcohol, or use illegal drugs. Some items may interact with your medicine. What should I watch for while using this  medicine? Visit your doctor or health care professional for regular checks on your progress. Your doctor or health care professional may order blood tests and other tests to see how you are doing. Call your doctor or health care professional for advice if you get a fever, chills or sore throat, or other symptoms of a cold or flu. Do not treat yourself. This drug may decrease your body's ability to fight infection. Try to avoid being around people who are sick. You should make sure you get enough calcium and vitamin D while you are taking this medicine, unless your doctor tells you not to. Discuss the foods you eat and the vitamins you take with your health care professional. See your dentist regularly. Brush and floss your teeth as directed. Before you have any dental work done, tell your dentist you are receiving this medicine. Do not become pregnant while taking this medicine or for 5 months after stopping it. Talk with your doctor or health care professional about your birth control options while taking this medicine. Women should inform their doctor if they wish to become pregnant or think they might be pregnant. There is a potential for serious side effects to an unborn child. Talk to your health care professional or pharmacist for more information. What side effects may I notice from receiving this medicine? Side effects that you should report to your doctor or health care professional as soon as possible:  allergic reactions like skin rash, itching or hives, swelling of the face, lips, or tongue  bone pain  breathing problems  dizziness  jaw pain, especially after dental work  redness, blistering, peeling of the skin  signs and symptoms of infection like fever or chills; cough; sore throat; pain or trouble passing urine  signs of low calcium like fast heartbeat, muscle cramps or muscle pain; pain, tingling, numbness in the hands or feet; seizures  unusual bleeding or  bruising  unusually weak or tired Side effects that usually do not require medical attention (report to your doctor or health care professional if they continue or are bothersome):  constipation  diarrhea  headache  joint pain  loss of appetite  muscle pain  runny nose  tiredness  upset stomach This list may not describe all possible side effects. Call your doctor for medical advice about side effects. You may report side effects to FDA at 1-800-FDA-1088. Where should I keep my medicine? This medicine is only given in a clinic, doctor's office, or other health care setting and will not be stored at home. NOTE: This sheet is a summary. It may not cover all possible information. If you have questions about this medicine, talk to your doctor, pharmacist, or health care provider.  2020 Elsevier/Gold Standard (2017-07-22 16:10:44)

## 2019-04-04 NOTE — Progress Notes (Signed)
This visit type was conducted due to national recommendations for restrictions regarding the COVID-19 pandemic in an effort to limit this patient's exposure and mitigate transmission in our community.   Virtual Visit via Video Note  I connected with Heidi Shah on 04/04/19 at  2:30 PM EST by a video enabled telemedicine application and verified that I am speaking with the correct person using two identifiers.  Location patient: home Location provider:work or home office Persons participating in the virtual visit: patient, provider  I discussed the limitations of evaluation and management by telemedicine and the availability of in person appointments. The patient expressed understanding and agreed to proceed.   HPI: Heidi Shah had recent DEXA scan.  This showed T score of -3.0 involving the radius and -2.6 left femur.  This compared with T score of -1.9 left femur back in 2017.  She just recently started back calcium and vitamin D supplementation.  She has never been treated for osteoporosis.  This is the first time she has been diagnosed with osteoporosis.  No recent fractures.  She does have a longstanding history of GERD with complication of stricture.  She was able to taper herself off Nexium last March and current GERD symptoms are relatively stable.  She had recent basic chemistries including alkaline phosphatase, calcium, TSH which were normal.  No recent vitamin D level.   ROS: See pertinent positives and negatives per HPI.  Past Medical History:  Diagnosis Date  . Cataract    surgery  . Chicken pox   . Colon polyps   . Depression   . Diverticulitis   . Diverticulosis   . Esophageal stricture   . GERD (gastroesophageal reflux disease)   . Heart murmur   . Hypertension     Past Surgical History:  Procedure Laterality Date  . cataract surgery Bilateral 1996  . EYE SURGERY  2000   detached L retina  . HERNIA REPAIR  1990  . NECK SURGERY     as a child    Family  History  Problem Relation Age of Onset  . Heart disease Mother 69  . COPD Mother   . Crohn's disease Mother   . Heart disease Father 65  . Cancer Father        kidney  . Colon cancer Neg Hx   . Esophageal cancer Neg Hx   . Stomach cancer Neg Hx     SOCIAL HX: Non-smoker   Current Outpatient Medications:  .  ALPRAZolam (XANAX) 0.25 MG tablet, TAKE ONE TABLET BY MOUTH TWICE A DAY AS NEEDED. **NOTE: PER DR. Elease Hashimoto, USE THESE SPARINGLY**, Disp: , Rfl:  .  aspirin 81 MG tablet, Take 81 mg by mouth daily., Disp: , Rfl:  .  Cholecalciferol (D3 ADULT PO), Take by mouth. daily, Disp: , Rfl:  .  esomeprazole (NEXIUM) 40 MG capsule, TAKE ONE CAPSULE BY MOUTH TWICE A DAY AT 8 AM AND 10 PM, Disp: 60 capsule, Rfl: 6 .  lisinopril-hydrochlorothiazide (ZESTORETIC) 20-12.5 MG tablet, TAKE ONE TABLET BY MOUTH ONE TIME DAILY, Disp: 90 tablet, Rfl: 1 .  LUMIGAN 0.01 % SOLN, , Disp: , Rfl:  .  Multiple Vitamin (MULTI-VITAMIN) tablet, Take by mouth., Disp: , Rfl:  .  Olopatadine HCl (PATADAY OP), Apply to eye., Disp: , Rfl:  .  ROCKLATAN 0.02-0.005 % SOLN, , Disp: , Rfl:  .  saccharomyces boulardii (FLORASTOR) 250 MG capsule, Take 250 mg by mouth 2 (two) times daily., Disp: , Rfl:  .  vitamin C (  ASCORBIC ACID) 500 MG tablet, Take 500 mg by mouth daily., Disp: , Rfl:   Current Facility-Administered Medications:  .  0.9 %  sodium chloride infusion, 500 mL, Intravenous, Once, Irene Shipper, MD  EXAM:  VITALS per patient if applicable:  GENERAL: alert, oriented, appears well and in no acute distress  HEENT: atraumatic, conjunttiva clear, no obvious abnormalities on inspection of external nose and ears  NECK: normal movements of the head and neck  LUNGS: on inspection no signs of respiratory distress, breathing rate appears normal, no obvious gross SOB, gasping or wheezing  CV: no obvious cyanosis  MS: moves all visible extremities without noticeable abnormality  PSYCH/NEURO: pleasant and  cooperative, no obvious depression or anxiety, speech and thought processing grossly intact  ASSESSMENT AND PLAN:  Discussed the following assessment and plan:  Osteoporosis, unspecified osteoporosis type, unspecified pathological fracture presence  Osteoporosis without current pathological fracture, unspecified osteoporosis type  -We discussed osteoporosis treatment options.  We recommended medical therapy given the fact this has progressed significantly over the past few years and patient concurs  -We discussed the fact that oral bisphosphonates are generally considered first-line therapy but because of her longstanding history of GERD with complications this may not be a good option for her.  She is very reluctant to consider oral bisphosphonates.  We did discuss other options including Reclast and Prolia.  She has some interest in pursuing Prolia.  She would like to try to avoid IV infusion.  -We will see if we can get her approved for Prolia.  Would recommend 25 hydroxy vitamin D level and electrolyte reassessment prior to Prolia injection  -continue with regular weight bearing exercise and daily calcium 1,200 mg and Vit D 1,000 IU.     I discussed the assessment and treatment plan with the patient. The patient was provided an opportunity to ask questions and all were answered. The patient agreed with the plan and demonstrated an understanding of the instructions.   The patient was advised to call back or seek an in-person evaluation if the symptoms worsen or if the condition fails to improve as anticipated.     Carolann Littler, MD

## 2019-04-05 ENCOUNTER — Other Ambulatory Visit: Payer: Self-pay | Admitting: Family Medicine

## 2019-04-12 ENCOUNTER — Other Ambulatory Visit: Payer: Self-pay

## 2019-04-12 ENCOUNTER — Ambulatory Visit
Admission: RE | Admit: 2019-04-12 | Discharge: 2019-04-12 | Disposition: A | Discharge status: Home / Self Care | Payer: Medicare HMO | Source: Ambulatory Visit | Attending: Family Medicine | Admitting: Family Medicine

## 2019-04-12 DIAGNOSIS — Z1231 Encounter for screening mammogram for malignant neoplasm of breast: Secondary | ICD-10-CM | POA: Diagnosis not present

## 2019-04-24 ENCOUNTER — Ambulatory Visit (INDEPENDENT_AMBULATORY_CARE_PROVIDER_SITE_OTHER): Payer: Medicare HMO

## 2019-04-24 VITALS — Ht 59.5 in | Wt 100.0 lb

## 2019-04-24 DIAGNOSIS — Z0000 Encounter for general adult medical examination without abnormal findings: Secondary | ICD-10-CM | POA: Diagnosis not present

## 2019-04-24 NOTE — Progress Notes (Signed)
This visit is being conducted via phone call due to the COVID-19 pandemic. This patient has given me verbal consent via phone to conduct this visit, patient states they are participating from their home address. Some vital signs may be absent or patient reported.   Patient identification: identified by name, DOB, and current address.  Location provider: [facility], Office Persons participating in the virtual visit: Ms. Kalayna Darpino and Franne Forts, LPN.   Subjective:   Heidi Shah is a 74 y.o. female who presents for Medicare Annual (Subsequent) preventive examination.  Heidi Shah is doing a great job with physical exercise by walking >60 minutes almost every day. She is awaiting prior authorization process for prolia to treat osteoporosis recently diagnosed by DEXA completed on 04/03/2019. Discussion on importance of continued walking and weight bearing exercises as well as increasing intake of calcium rich foods because she is unable to tolerate oral calcium supplements.  Review of Systems:  No ROS; Annual Medicare Wellness Visit by telephone   Cardiac Risk Factors include: advanced age (>17men, >64 women);hypertension    Objective:     Vitals: Ht 4' 11.5" (1.511 m)   Wt 100 lb (45.4 kg)   BMI 19.86 kg/m   Body mass index is 19.86 kg/m.  Advanced Directives 04/24/2019 04/20/2018 04/17/2018 07/28/2017 03/04/2017  Does Patient Have a Medical Advance Directive? No No No No No  Would patient like information on creating a medical advance directive? Yes (MAU/Ambulatory/Procedural Areas - Information given) No - Patient declined Yes (MAU/Ambulatory/Procedural Areas - Information given) - -    Tobacco Social History   Tobacco Use  Smoking Status Never Smoker  Smokeless Tobacco Never Used     Counseling given: Not Answered   Clinical Intake:  Pre-visit preparation completed: Yes  Pain : No/denies pain     BMI - recorded: 19.86 Nutritional Status: BMI of  19-24  Normal Nutritional Risks: None Diabetes: No  How often do you need to have someone help you when you read instructions, pamphlets, or other written materials from your doctor or pharmacy?: 1 - Never What is the last grade level you completed in school?: master's degree  Interpreter Needed?: No  Information entered by :: Franne Forts, LPN.  Past Medical History:  Diagnosis Date  . Cataract    surgery  . Chicken pox   . Colon polyps   . Depression   . Diverticulitis   . Diverticulosis   . Esophageal stricture   . GERD (gastroesophageal reflux disease)   . Heart murmur   . Hypertension    Past Surgical History:  Procedure Laterality Date  . cataract surgery Bilateral 1996  . EYE SURGERY  2000   detached L retina  . HERNIA REPAIR  1990  . NECK SURGERY     as a child   Family History  Problem Relation Age of Onset  . Heart disease Mother 20  . COPD Mother   . Crohn's disease Mother   . Heart disease Father 41  . Cancer Father        kidney  . Colon cancer Neg Hx   . Esophageal cancer Neg Hx   . Stomach cancer Neg Hx    Social History   Socioeconomic History  . Marital status: Divorced    Spouse name: Not on file  . Number of children: 2  . Years of education: master's degree  . Highest education level: Master's degree (e.g., MA, MS, MEng, MEd, MSW, MBA)  Occupational History  .  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 Smoking status: Never Smoker  . Smokeless tobacco: Never Used  Substance and Sexual Activity  . Alcohol use: Yes    Alcohol/week: 1.0 standard drinks    Types: 1 Glasses of wine per week    Comment: one or two glasses of wine a couple times a week  . Drug use: No  . Sexual activity: Not on file  Other Topics Concern  . Not on file  Social History Narrative      Lives alone in ranch home.    2 children   Son lives in [place]   Continues to teach virtually part time   Social  Determinants of Health   Financial Resource Strain: Low Risk   . Difficulty of Paying Living Expenses: Not very hard  Food Insecurity: No Food Insecurity  . Worried About Charity fundraiser in the Last Year: Never true  . Ran Out of Food in the Last Year: Never true  Transportation Needs: No Transportation Needs  . Lack of Transportation (Medical): No  . Lack of Transportation (Non-Medical): No  Physical Activity: Sufficiently Active  . Days of Exercise per Week: 7 days  . Minutes of Exercise per Session: 90 min  Stress:   . Feeling of Stress : Not on file  Social Connections: Slightly Isolated  . Frequency of Communication with Friends and Family: More than three times a week  . Frequency of Social Gatherings with Friends and Family: Once a week  . Attends Religious Services: More than 4 times per year  . Active Member of Clubs or Organizations: Yes  . Attends Archivist Meetings: More than 4 times per year  . Marital Status: Divorced    Outpatient Encounter Medications as of 04/24/2019  Medication Sig  . aspirin 81 MG tablet Take 81 mg by mouth daily.  . Cholecalciferol (D3 ADULT PO) Take by mouth. daily  . lisinopril-hydrochlorothiazide (ZESTORETIC) 20-12.5 MG tablet TAKE ONE TABLET BY MOUTH ONE TIME DAILY  . Multiple Vitamin (MULTI-VITAMIN) tablet Take by mouth.  . saccharomyces boulardii (FLORASTOR) 250 MG capsule Take 250 mg by mouth 2 (two) times daily.  . vitamin C (ASCORBIC ACID) 500 MG tablet Take 500 mg by mouth daily.  Marland Kitchen ALPRAZolam (XANAX) 0.25 MG tablet TAKE ONE TABLET BY MOUTH TWICE A DAY AS NEEDED. **NOTE: PER DR. Elease Hashimoto, USE THESE SPARINGLY**  . esomeprazole (NEXIUM) 40 MG capsule TAKE ONE CAPSULE BY MOUTH TWICE A DAY AT 8 AM AND 10 PM (Patient not taking: Reported on 04/24/2019)  . FLUAD QUADRIVALENT 0.5 ML injection   . LUMIGAN 0.01 % SOLN   . Olopatadine HCl (PATADAY OP) Apply to eye.  Marland Kitchen ROCKLATAN 0.02-0.005 % SOLN    Facility-Administered  Encounter Medications as of 04/24/2019  Medication  . 0.9 %  sodium chloride infusion    Activities of Daily Living In your present state of health, do you have any difficulty performing the following activities: 04/24/2019  Hearing? N  Vision? N  Difficulty concentrating or making decisions? N  Dressing or bathing? N  Doing errands, shopping? N  Preparing Food and eating ? N  Using the Toilet? N  In the past six months, have you accidently leaked urine? N  Do you have problems with loss of bowel control? N  Managing your Medications? N  Managing your Finances? N  Housekeeping or managing your Housekeeping? N  Some recent data might  be hidden    Patient Care Team: Eulas Post, MD as PCP - General (Family Medicine) Kevan Rosebush, MD as Referring Physician (Dermatology) [place], [place], [place] (Optometry) Irene Shipper, MD as Consulting Physician (Gastroenterology)    Assessment:   This is a routine wellness examination for Marenda.  Exercise Activities and Dietary recommendations Current Exercise Habits: Home exercise routine, Type of exercise: walking, Time (Minutes): > 60, Frequency (Times/Week): 7, Weekly Exercise (Minutes/Week): 0, Intensity: Moderate, Exercise limited by: None identified  Goals    . avoid covid    . Patient Stated     Eat calcium rich foods for osteoporosis treatment since you are unable to tolerate calcium supplements       Fall Risk Fall Risk  04/24/2019 02/19/2019 04/17/2018 02/17/2018 03/04/2017  Falls in the past year? 0 0 1 1 No  Number falls in past yr: - - 0 1 -  Injury with Fall? - - 1 1 -  Risk for fall due to : Medication side effect - History of fall(s);Impaired balance/gait;Impaired vision - -  Follow up - - Education provided;Falls prevention discussed - -   Is the patient's home free of loose throw rugs in walkways, pet beds, electrical cords, etc?   yes      Grab bars in the bathroom? yes      Handrails on the stairs?   yes       Adequate lighting?   yes  Timed Get Up and Go performed: N/A due to telephone visit.  Depression Screen PHQ 2/9 Scores 04/24/2019 02/19/2019 04/17/2018 02/17/2018  PHQ - 2 Score 0 0 0 0  PHQ- 9 Score - 0 0 -     Cognitive Function MMSE - Mini Mental State Exam 03/04/2017  Not completed: (No Data)     6CIT Screen 04/24/2019  What Year? 0 points  What month? 0 points  What time? 0 points  Count back from 20 0 points  Months in reverse 0 points  Repeat phrase 0 points  Total Score 0    Immunization History  Administered Date(s) Administered  . Fluad Quad(high Dose 65+) 01/05/2019  . Influenza, High Dose Seasonal PF 02/03/2015, 02/13/2016, 01/09/2017, 01/02/2018  . Influenza,inj,Quad PF,6+ Mos 12/27/2012, 12/26/2013  . Influenza-Unspecified 01/09/2017, 01/05/2019  . Pneumococcal Conjugate-13 01/01/2014  . Pneumococcal Polysaccharide-23 12/27/2012  . Td 08/27/2005  . Tdap 02/26/2016  . Zoster Recombinat (Shingrix) 02/20/2019    Qualifies for Shingles Vaccine? Patient has completed shingrix #1 and is planning on #2 after completion of covid vaccines.   Screening Tests Health Maintenance  Topic Date Due  . Hepatitis C Screening  02/12/2026 (Originally 1946/02/10)  . PNA vac Low Risk Adult (2 of 2 - PPSV23) 04/12/2046 (Originally 12/27/2017)  . COLONOSCOPY  06/08/2020  . MAMMOGRAM  04/11/2021  . TETANUS/TDAP  02/25/2026  . INFLUENZA VACCINE  Completed  . DEXA SCAN  Completed    Cancer Screenings: Lung: Low Dose CT Chest recommended if Age 24-80 years, 30 pack-year currently smoking OR have quit w/in 15years. Patient does not qualify. Breast:  Up to date on Mammogram? Yes   Up to date of Bone Density/Dexa? Yes Colorectal: yes  Additional Screenings:  Hepatitis C Screening: plan to collect at next routine lab draw in office.     Plan:   Ms. Sheesley plans to complete Shingrix #2 after she gets both covid vaccines. She will increase foods rich in calcium to help with  osteoporosis also.    I have personally  reviewed and noted the following in the patient's chart:   . Medical and social history . Use of alcohol, tobacco or illicit drugs  . Current medications and supplements . Functional ability and status . Nutritional status . Physical activity . Advanced directives . List of other physicians . Hospitalizations, surgeries, and ER visits in previous 12 months . Vitals . Screenings to include cognitive, depression, and falls . Referrals and appointments  In addition, I have reviewed and discussed with patient certain preventive protocols, quality metrics, and best practice recommendations. A written personalized care plan for preventive services as well as general preventive health recommendations were provided to patient.     Franne Forts, LPN  [id number]

## 2019-04-24 NOTE — Patient Instructions (Addendum)
Ms. Heidi Shah , Thank you for taking time to participate in your Medicare Wellness Visit. I appreciate your ongoing commitment to your health goals. Please review the following plan we discussed and let me know if I can assist you in the future.   Screening recommendations/referrals: Colorectal Screening: colonoscopy 06/09/2015; due again 06/08/2020. Mammogram: completed 04/12/2019; due 04/11/2020. Bone Density: completed 04/03/2019; due 04/02/2021. Dr Burchette's office to work on prior authorization for prolia injections.  Vision and Dental Exams: Recommended annual ophthalmology exams for early detection of glaucoma and other disorders of the eye. Next eye appointment is this week. Recommended annual dental exams for proper oral hygiene. Behind on dental appointments due to covid pandemic.   Diabetic Exams: Diabetic Eye Exam: N/A Diabetic Foot Exam: N/A  Vaccinations: Influenza vaccine: completed 01/05/2019; due again in Fall 2021. Pneumococcal vaccine: completed 12/27/2012 & 01/01/2014. Up to date.  Tdap vaccine: completed 02/26/2016; due again 02/25/2026. Shingles vaccine: Shingrix #1 completed on 02/20/2019. Have 2nd one done > 1 month after completion of covid vaccines.  Advanced directives: Advance directives discussed with you today. I have provided a copy for you to complete at home and have notarized. Once this is complete please bring a copy in to our office so we can scan it into your chart.   Goals: Recommend to drink at least 6-8 8oz glasses of water per day.  Continue to exercise for at least 150 minutes per week.  Recommend to remove any items from the home that may cause slips or trips.  Next appointment: Please schedule your Annual Wellness Visit with your Nurse Health Advisor in one year.  Preventive Care 39 Years and Older, Female Preventive care refers to lifestyle choices and visits with your health care provider that can promote health and wellness. What  does preventive care include?  A yearly physical exam. This is also called an annual well check.  Dental exams once or twice a year.  Routine eye exams. Ask your health care provider how often you should have your eyes checked.  Personal lifestyle choices, including:  Daily care of your teeth and gums.  Regular physical activity.  Eating a healthy diet.  Avoiding tobacco and drug use.  Limiting alcohol use.  Practicing safe sex.  Taking low-dose aspirin every day if recommended by your health care provider.  Taking vitamin and mineral supplements as recommended by your health care provider. What happens during an annual well check? The services and screenings done by your health care provider during your annual well check will depend on your age, overall health, lifestyle risk factors, and family history of disease. Counseling  Your health care provider may ask you questions about your:  Alcohol use.  Tobacco use.  Drug use.  Emotional well-being.  Home and relationship well-being.  Sexual activity.  Eating habits.  History of falls.  Memory and ability to understand (cognition).  Work and work Statistician.  Reproductive health. Screening  You may have the following tests or measurements:  Height, weight, and BMI.  Blood pressure.  Lipid and cholesterol levels. These may be checked every 5 years, or more frequently if you are over 8 years old.  Skin check.  Lung cancer screening. You may have this screening every year starting at age 73 if you have a 30-pack-year history of smoking and currently smoke or have quit within the past 15 years.  Fecal occult blood test (FOBT) of the stool. You may have this test every year starting at age 105.  Flexible sigmoidoscopy or colonoscopy. You may have a sigmoidoscopy every 5 years or a colonoscopy every 10 years starting at age 7.  Hepatitis C blood test.  Hepatitis B blood test.  Sexually transmitted  disease (STD) testing.  Diabetes screening. This is done by checking your blood sugar (glucose) after you have not eaten for a while (fasting). You may have this done every 1-3 years.  Bone density scan. This is done to screen for osteoporosis. You may have this done starting at age 66.  Mammogram. This may be done every 1-2 years. Talk to your health care provider about how often you should have regular mammograms. Talk with your health care provider about your test results, treatment options, and if necessary, the need for more tests. Vaccines  Your health care provider may recommend certain vaccines, such as:  Influenza vaccine. This is recommended every year.  Tetanus, diphtheria, and acellular pertussis (Tdap, Td) vaccine. You may need a Td booster every 10 years.  Zoster vaccine. You may need this after age 64.  Pneumococcal 13-valent conjugate (PCV13) vaccine. One dose is recommended after age 74.  Pneumococcal polysaccharide (PPSV23) vaccine. One dose is recommended after age 59. Talk to your health care provider about which screenings and vaccines you need and how often you need them. This information is not intended to replace advice given to you by your health care provider. Make sure you discuss any questions you have with your health care provider. Document Released: 04/11/2015 Document Revised: 12/03/2015 Document Reviewed: 01/14/2015 Elsevier Interactive Patient Education  2017 [place] Prevention in the Home Falls can cause injuries. They can happen to people of all ages. There are many things you can do to make your home safe and to help prevent falls. What can I do on the outside of my home?  Regularly fix the edges of walkways and driveways and fix any cracks.  Remove anything that might make you trip as you walk through a door, such as a raised step or threshold.  Trim any bushes or trees on the path to your home.  Use bright outdoor  lighting.  Clear any walking paths of anything that might make someone trip, such as rocks or tools.  Regularly check to see if handrails are loose or broken. Make sure that both sides of any steps have handrails.  Any raised decks and porches should have guardrails on the edges.  Have any leaves, snow, or ice cleared regularly.  Use sand or salt on walking paths during winter.  Clean up any spills in your garage right away. This includes oil or grease spills. What can I do in the bathroom?  Use night lights.  Install grab bars by the toilet and in the tub and shower. Do not use towel bars as grab bars.  Use non-skid mats or decals in the tub or shower.  If you need to sit down in the shower, use a plastic, non-slip stool.  Keep the floor dry. Clean up any water that spills on the floor as soon as it happens.  Remove soap buildup in the tub or shower regularly.  Attach bath mats securely with double-sided non-slip rug tape.  Do not have throw rugs and other things on the floor that can make you trip. What can I do in the bedroom?  Use night lights.  Make sure that you have a light by your bed that is easy to reach.  Do not use any sheets or blankets  that are too big for your bed. They should not hang down onto the floor.  Have a firm chair that has side arms. You can use this for support while you get dressed.  Do not have throw rugs and other things on the floor that can make you trip. What can I do in the kitchen?  Clean up any spills right away.  Avoid walking on wet floors.  Keep items that you use a lot in easy-to-reach places.  If you need to reach something above you, use a strong step stool that has a grab bar.  Keep electrical cords out of the way.  Do not use floor polish or wax that makes floors slippery. If you must use wax, use non-skid floor wax.  Do not have throw rugs and other things on the floor that can make you trip. What can I do with my  stairs?  Do not leave any items on the stairs.  Make sure that there are handrails on both sides of the stairs and use them. Fix handrails that are broken or loose. Make sure that handrails are as long as the stairways.  Check any carpeting to make sure that it is firmly attached to the stairs. Fix any carpet that is loose or worn.  Avoid having throw rugs at the top or bottom of the stairs. If you do have throw rugs, attach them to the floor with carpet tape.  Make sure that you have a light switch at the top of the stairs and the bottom of the stairs. If you do not have them, ask someone to add them for you. What else can I do to help prevent falls?  Wear shoes that:  Do not have high heels.  Have rubber bottoms.  Are comfortable and fit you well.  Are closed at the toe. Do not wear sandals.  If you use a stepladder:  Make sure that it is fully opened. Do not climb a closed stepladder.  Make sure that both sides of the stepladder are locked into place.  Ask someone to hold it for you, if possible.  Clearly mark and make sure that you can see:  Any grab bars or handrails.  First and last steps.  Where the edge of each step is.  Use tools that help you move around (mobility aids) if they are needed. These include:  Canes.  Walkers.  Scooters.  Crutches.  Turn on the lights when you go into a dark area. Replace any light bulbs as soon as they burn out.  Set up your furniture so you have a clear path. Avoid moving your furniture around.  If any of your floors are uneven, fix them.  If there are any pets around you, be aware of where they are.  Review your medicines with your doctor. Some medicines can make you feel dizzy. This can increase your chance of falling. Ask your doctor what other things that you can do to help prevent falls. This information is not intended to replace advice given to you by your health care provider. Make sure you discuss any  questions you have with your health care provider. Document Released: 01/09/2009 Document Revised: 08/21/2015 Document Reviewed: 04/19/2014 Elsevier Interactive Patient Education  2017 [employer].

## 2019-04-25 DIAGNOSIS — H401131 Primary open-angle glaucoma, bilateral, mild stage: Secondary | ICD-10-CM | POA: Diagnosis not present

## 2019-05-14 ENCOUNTER — Other Ambulatory Visit: Payer: Self-pay

## 2019-05-14 ENCOUNTER — Ambulatory Visit: Payer: Medicare HMO

## 2019-05-16 ENCOUNTER — Telehealth: Payer: Self-pay | Admitting: *Deleted

## 2019-05-16 NOTE — Telephone Encounter (Signed)
Insurance verification will be started today for Prolia. Patient will need a Vitamin D level. Okay to order?

## 2019-05-16 NOTE — Telephone Encounter (Signed)
She does need 25 OH Vit D level and OK to order.   I like to get BMP and magnesium level prior to Prolia injection and would also add those.

## 2019-05-16 NOTE — Telephone Encounter (Signed)
-----   Message from Eulas Post, MD sent at 04/04/2019  6:04 PM EST ----- Apolonio Schneiders,  We want to see if we can get this patient on Prolia.  She has hx of GERD with complications including stricture and cannot take oral bis-phosphonates.  Let me know if there is anything else I need to do to try to get insurance approval.  Thanks!  Darnell Level

## 2019-05-18 ENCOUNTER — Other Ambulatory Visit: Payer: Self-pay | Admitting: Family Medicine

## 2019-05-18 DIAGNOSIS — K219 Gastro-esophageal reflux disease without esophagitis: Secondary | ICD-10-CM

## 2019-05-18 DIAGNOSIS — M810 Age-related osteoporosis without current pathological fracture: Secondary | ICD-10-CM

## 2019-05-18 DIAGNOSIS — Z13820 Encounter for screening for osteoporosis: Secondary | ICD-10-CM

## 2019-05-18 NOTE — Telephone Encounter (Signed)
Patient is aware that Prolia injection will cost $230. Information given about [facility] [employer] [facility] Labs and appointment placed Patient to call back if interested in Prolia

## 2019-05-19 ENCOUNTER — Ambulatory Visit: Payer: Medicare HMO | Attending: Internal Medicine

## 2019-05-19 DIAGNOSIS — Z23 Encounter for immunization: Secondary | ICD-10-CM | POA: Insufficient documentation

## 2019-05-19 NOTE — Progress Notes (Signed)
   Covid-19 Vaccination Clinic  Name:  Heidi Shah    MRN: [id number] DOB: [birth year]  05/19/2019  [name] was observed post Covid-19 immunization for 15 minutes without incidence. She was provided with Vaccine Information Sheet and instruction to access the V-Safe system.   [name] was instructed to call 911 with any severe reactions post vaccine: Marland Kitchen Difficulty breathing  . Swelling of your face and throat  . A fast heartbeat  . A bad rash all over your body  . Dizziness and weakness    Immunizations Administered    Name Date Dose VIS Date Route   Pfizer COVID-19 Vaccine 05/19/2019 12:48 PM 0.3 mL 03/09/2019 Intramuscular   Manufacturer: [place]   Lot: [id number]   [place]: [id number]

## 2019-05-28 ENCOUNTER — Other Ambulatory Visit: Payer: Self-pay

## 2019-06-07 NOTE — Telephone Encounter (Signed)
Patient was approved for the grant. prolia injection scheduled for 07/10/19.

## 2019-06-19 ENCOUNTER — Ambulatory Visit: Payer: Medicare HMO | Attending: Internal Medicine

## 2019-06-19 DIAGNOSIS — Z23 Encounter for immunization: Secondary | ICD-10-CM

## 2019-06-19 NOTE — Progress Notes (Signed)
   Covid-19 Vaccination Clinic  Name:  Heidi Shah    MRN: [id number] DOB: 09-06-1945  06/19/2019  Heidi Shah was observed post Covid-19 immunization for 15 minutes without incident. She was provided with Vaccine Information Sheet and instruction to access the V-Safe system.   Heidi Shah was instructed to call 911 with any severe reactions post vaccine: Marland Kitchen Difficulty breathing  . Swelling of face and throat  . A fast heartbeat  . A bad rash all over body  . Dizziness and weakness   Immunizations Administered    Name Date Dose VIS Date Route   Pfizer COVID-19 Vaccine 06/19/2019  8:29 AM 0.3 mL 03/09/2019 Intramuscular   Manufacturer: [place]   Lot: [id number]   [place]: [id number]

## 2019-06-25 ENCOUNTER — Other Ambulatory Visit: Payer: Self-pay

## 2019-06-26 ENCOUNTER — Other Ambulatory Visit (INDEPENDENT_AMBULATORY_CARE_PROVIDER_SITE_OTHER): Payer: Medicare HMO

## 2019-06-26 DIAGNOSIS — M810 Age-related osteoporosis without current pathological fracture: Secondary | ICD-10-CM | POA: Diagnosis not present

## 2019-06-26 DIAGNOSIS — K219 Gastro-esophageal reflux disease without esophagitis: Secondary | ICD-10-CM | POA: Diagnosis not present

## 2019-06-26 DIAGNOSIS — Z13820 Encounter for screening for osteoporosis: Secondary | ICD-10-CM

## 2019-06-26 LAB — MAGNESIUM: Magnesium: 1.7 mg/dL (ref 1.5–2.5)

## 2019-06-26 LAB — BASIC METABOLIC PANEL
BUN: 12 mg/dL (ref 6–23)
CO2: 30 mEq/L (ref 19–32)
Calcium: 9.4 mg/dL (ref 8.4–10.5)
Chloride: 101 mEq/L (ref 96–112)
Creatinine, Ser: 0.57 mg/dL (ref 0.40–1.20)
GFR: 104.44 mL/min (ref >60.00)
Glucose, Bld: 93 mg/dL (ref 70–99)
Potassium: 4 mEq/L (ref 3.5–5.1)
Sodium: 137 mEq/L (ref 135–145)

## 2019-06-26 LAB — VITAMIN D 25 HYDROXY (VIT D DEFICIENCY, FRACTURES): VITD: 69.84 ng/mL (ref 30.00–100.00)

## 2019-07-09 ENCOUNTER — Other Ambulatory Visit: Payer: Self-pay

## 2019-07-10 ENCOUNTER — Ambulatory Visit (INDEPENDENT_AMBULATORY_CARE_PROVIDER_SITE_OTHER): Payer: Medicare HMO

## 2019-07-10 DIAGNOSIS — M810 Age-related osteoporosis without current pathological fracture: Secondary | ICD-10-CM | POA: Diagnosis not present

## 2019-07-10 MED ORDER — DENOSUMAB 60 MG/ML ~~LOC~~ SOSY
60.0000 mg | PREFILLED_SYRINGE | Freq: Once | SUBCUTANEOUS | Status: AC
  Administered 2019-07-10: 13:00:00 60 mg via SUBCUTANEOUS

## 2019-07-10 NOTE — Progress Notes (Signed)
Pt tolerated shot well 

## 2019-07-23 DIAGNOSIS — D225 Melanocytic nevi of trunk: Secondary | ICD-10-CM | POA: Diagnosis not present

## 2019-07-23 DIAGNOSIS — L821 Other seborrheic keratosis: Secondary | ICD-10-CM | POA: Diagnosis not present

## 2019-07-23 DIAGNOSIS — S1086XA Insect bite of other specified part of neck, initial encounter: Secondary | ICD-10-CM | POA: Diagnosis not present

## 2019-07-23 DIAGNOSIS — L814 Other melanin hyperpigmentation: Secondary | ICD-10-CM | POA: Diagnosis not present

## 2019-09-26 ENCOUNTER — Other Ambulatory Visit: Payer: Self-pay | Admitting: Family Medicine

## 2019-11-01 ENCOUNTER — Telehealth: Payer: Self-pay | Admitting: *Deleted

## 2019-11-01 NOTE — Telephone Encounter (Signed)
Yes.   There is certainly some risk of getting Covid in previously vaccinated patients, but less risk of infection and also less risk of severe infection.  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

## 2019-11-01 NOTE — Telephone Encounter (Signed)
Patient is aware and agrees. 

## 2019-11-01 NOTE — Telephone Encounter (Signed)
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  She has had her Covid vaccine and will be wearing a mask.  She does feel comfortable to return to work.  She would like to know if Dr Elease Hashimoto agrees with her?

## 2019-11-07 ENCOUNTER — Other Ambulatory Visit: Payer: Self-pay

## 2019-11-07 ENCOUNTER — Ambulatory Visit (INDEPENDENT_AMBULATORY_CARE_PROVIDER_SITE_OTHER): Payer: Medicare HMO | Admitting: Family Medicine

## 2019-11-07 ENCOUNTER — Encounter: Payer: Self-pay | Admitting: Family Medicine

## 2019-11-07 VITALS — BP 110/70 | HR 85 | Temp 98.8°F | Wt 114.8 lb

## 2019-11-07 DIAGNOSIS — M21371 Foot drop, right foot: Secondary | ICD-10-CM | POA: Diagnosis not present

## 2019-11-07 NOTE — Progress Notes (Signed)
Established Patient Office Visit  Subjective:  Patient ID: Heidi Shah, female    DOB: 1945/07/12  Age: 74 y.o. MRN: [id number]  CC:  Chief Complaint  Patient presents with  . foot drop    right    HPI Heidi Shah presents for transient right foot drop.  She states that she ate lunch today and sat for about an hour on a bench seat.  She was not crossing her leg.  When she went to get up she noticed difficulty with dorsiflexion of the right.  This lasted about 5 minutes.  She was still able to ambulate but felt as though she had difficulty lifting her toes.  This completely resolved after about 5 minutes.  She denied any right upper extremity weakness.  No proximal RLE weakness.  No slurred speech.  No facial weakness.  No low back pain.  No numbness or tingling in the extremities.  No cognitive changes.  No history of foot drop.  She does have some intermittent back pain but none currently  Past Medical History:  Diagnosis Date  . Cataract    surgery  . Chicken pox   . Colon polyps   . Depression   . Diverticulitis   . Diverticulosis   . Esophageal stricture   . GERD (gastroesophageal reflux disease)   . Heart murmur   . Hypertension     Past Surgical History:  Procedure Laterality Date  . cataract surgery Bilateral 1996  . EYE SURGERY  2000   detached L retina  . HERNIA REPAIR  1990  . NECK SURGERY     as a child    Family History  Problem Relation Age of Onset  . Heart disease Mother 35  . COPD Mother   . Crohn's disease Mother   . Heart disease Father 87  . Cancer Father        kidney  . Colon cancer Neg Hx   . Esophageal cancer Neg Hx   . Stomach cancer Neg Hx     Social History   Socioeconomic History  . Marital status: Divorced    Spouse name: Not on file  . Number of children: 2  . Years of education: master's degree  . Highest education level: Master's degree (e.g., MA, MS, MEng, MEd, MSW, MBA)  Occupational History  . 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 Smoking status: Never Smoker  . Smokeless tobacco: Never Used  Vaping Use  . Vaping Use: Never used  Substance and Sexual Activity  . Alcohol use: Yes    Alcohol/week: 1.0 standard drink    Types: 1 Glasses of wine per week    Comment: one or two glasses of wine a couple times a week  . Drug use: No  . Sexual activity: Not on file  Other Topics Concern  . Not on file  Social History Narrative      Lives alone in ranch home.    2 children   Son lives in [place]   Continues to teach virtually part time   Social Determinants of Health   Financial Resource Strain: Low Risk   . Difficulty of Paying Living Expenses: Not very hard  Food Insecurity: No Food Insecurity  . Worried About Charity fundraiser in the Last Year: Never true  . Ran Out of Food in the Last Year: Never true  Transportation Needs: No Transportation Needs  .  Lack of Transportation (Medical): No  . Lack of Transportation (Non-Medical): No  Physical Activity: Sufficiently Active  . Days of Exercise per Week: 7 days  . Minutes of Exercise per Session: 90 min  Stress:   . Feeling of Stress :   Social Connections: Moderately Integrated  . Frequency of Communication with Friends and Family: More than three times a week  . Frequency of Social Gatherings with Friends and Family: Once a week  . Attends Religious Services: More than 4 times per year  . Active Member of Clubs or Organizations: Yes  . Attends Archivist Meetings: More than 4 times per year  . Marital Status: Divorced  Human resources officer Violence:   . Fear of Current or Ex-Partner:   . Emotionally Abused:   Marland Kitchen Physically Abused:   . Sexually Abused:     Outpatient Medications Prior to Visit  Medication Sig Dispense Refill  . aspirin 81 MG tablet Take 81 mg by mouth daily.    . Cholecalciferol (D3 ADULT PO) Take by mouth. daily    . FLUAD QUADRIVALENT 0.5 ML injection       . lisinopril-hydrochlorothiazide (ZESTORETIC) 20-12.5 MG tablet TAKE ONE TABLET BY MOUTH ONE TIME DAILY 90 tablet 1  . Multiple Vitamin (MULTI-VITAMIN) tablet Take by mouth.    . saccharomyces boulardii (FLORASTOR) 250 MG capsule Take 250 mg by mouth 2 (two) times daily.    . vitamin C (ASCORBIC ACID) 500 MG tablet Take 500 mg by mouth daily.    Marland Kitchen ALPRAZolam (XANAX) 0.25 MG tablet TAKE ONE TABLET BY MOUTH TWICE A DAY AS NEEDED. **NOTE: PER DR. Elease Hashimoto, USE THESE SPARINGLY**    . esomeprazole (NEXIUM) 40 MG capsule TAKE ONE CAPSULE BY MOUTH TWICE A DAY AT 8 AM AND 10 PM (Patient not taking: Reported on 04/24/2019) 60 capsule 6  . LUMIGAN 0.01 % SOLN     . Olopatadine HCl (PATADAY OP) Apply to eye.    Marland Kitchen ROCKLATAN 0.02-0.005 % SOLN      Facility-Administered Medications Prior to Visit  Medication Dose Route Frequency Provider Last Rate Last Admin  . 0.9 %  sodium chloride infusion  500 mL Intravenous Once Irene Shipper, MD        Allergies  Allergen Reactions  . Beeswax     Per skin test from dermatologist  . Cetrimonium Chloride [Cetrimide]     Per skin test from dermatologist  . Methylisothiazolinone     Per skin test from dermatologist  . Neomycin Sulfate [Neomycin]     Per skin test from dermatologist  . Nickel Sulfate [Nickel]     Per skin test from dermatologist  . Penicillins   . Propolis     Per skin test from dermatologist    ROS Review of Systems  Constitutional: Negative for appetite change and unexpected weight change.  Respiratory: Negative for cough and shortness of breath.   Cardiovascular: Negative for chest pain.  Neurological: Negative for dizziness, facial asymmetry, speech difficulty, numbness and headaches.  Psychiatric/Behavioral: Negative for confusion.      Objective:    Physical Exam Vitals reviewed.  Constitutional:      Appearance: Normal appearance.  Neck:     Comments: No carotid bruits Cardiovascular:     Rate and Rhythm: Normal rate  and regular rhythm.  Pulmonary:     Effort: Pulmonary effort is normal.     Breath sounds: Normal breath sounds.  Neurological:     General: No focal deficit present.  Mental Status: She is alert and oriented to person, place, and time.     Cranial Nerves: No cranial nerve deficit.     Sensory: No sensory deficit.     Motor: No weakness.     Coordination: Coordination normal.     Gait: Gait normal.     Comments: She has very minimal reflexes knee and ankle bilaterally but reflexes are symmetric Full strength with plantarflexion and dorsiflexion bilaterally     BP 110/70 (BP Location: Left Arm, Patient Position: Sitting, Cuff Size: Normal)   Pulse 85   Temp 98.8 F (37.1 C) (Oral)   Wt 114 lb 12.8 oz (52.1 kg)   SpO2 95%   BMI 22.80 kg/m  Wt Readings from Last 3 Encounters:  11/07/19 114 lb 12.8 oz (52.1 kg)  04/24/19 100 lb (45.4 kg)  02/19/19 103 lb 1.6 oz (46.8 kg)     There are no preventive care reminders to display for this patient.  There are no preventive care reminders to display for this patient.  Lab Results  Component Value Date   TSH 1.06 02/19/2019   Lab Results  Component Value Date   WBC 4.3 02/19/2019   HGB 13.1 02/19/2019   HCT 38.7 02/19/2019   MCV 94.2 02/19/2019   PLT 324.0 02/19/2019   Lab Results  Component Value Date   NA 137 06/26/2019   K 4.0 06/26/2019   CO2 30 06/26/2019   GLUCOSE 93 06/26/2019   BUN 12 06/26/2019   CREATININE 0.57 06/26/2019   BILITOT 0.6 02/19/2019   ALKPHOS 42 02/19/2019   AST 20 02/19/2019   ALT 11 02/19/2019   PROT 6.6 02/19/2019   ALBUMIN 4.2 02/19/2019   CALCIUM 9.4 06/26/2019   GFR 104.44 06/26/2019   Lab Results  Component Value Date   CHOL 261 (H) 02/19/2019   Lab Results  Component Value Date   HDL 88.90 02/19/2019   Lab Results  Component Value Date   LDLCALC 161 (H) 02/19/2019   Lab Results  Component Value Date   TRIG 57.0 02/19/2019   Lab Results  Component Value Date    CHOLHDL 3 02/19/2019   No results found for: HGBA1C    Assessment & Plan:   Patient reports transient right lower extremity weakness with what sounds like transient foot drop.  Symptoms fully resolved after 5 minutes.  She does not have any other worrisome symptoms such as upper extremity weakness, numbness, speech change, facial weakness, etc.  ?peripheral compression vs lumbosacral.    -Avoid squatting or putting any pressure on the knee region -Follow-up immediately for any slurred speech, confusion, facial weakness, upper extremity weakness, or any recurrent symptoms -If symptoms recur consider further evaluation with MRI lumbar spine and possible EMG studies.   No orders of the defined types were placed in this encounter.   Follow-up: No follow-ups on file.    Carolann Littler, MD

## 2019-11-07 NOTE — Patient Instructions (Signed)
Let me know if you have any recurrent foot weakness  Watch for any new symptoms such as slurred speech, upper extremity weakness, facial weakness, or any confusion  Watch for any low back pain.

## 2019-11-19 DIAGNOSIS — H401131 Primary open-angle glaucoma, bilateral, mild stage: Secondary | ICD-10-CM | POA: Diagnosis not present

## 2019-12-26 ENCOUNTER — Encounter: Payer: Self-pay | Admitting: Family Medicine

## 2019-12-26 ENCOUNTER — Ambulatory Visit (INDEPENDENT_AMBULATORY_CARE_PROVIDER_SITE_OTHER): Payer: Medicare HMO | Admitting: Family Medicine

## 2019-12-26 ENCOUNTER — Other Ambulatory Visit: Payer: Self-pay

## 2019-12-26 VITALS — BP 138/78 | HR 93 | Temp 98.4°F | Ht <= 58 in | Wt 116.2 lb

## 2019-12-26 DIAGNOSIS — M5412 Radiculopathy, cervical region: Secondary | ICD-10-CM | POA: Diagnosis not present

## 2019-12-26 MED ORDER — PREDNISONE 10 MG PO TABS: ORAL_TABLET | ORAL | 0 refills | Status: DC

## 2019-12-26 NOTE — Patient Instructions (Signed)
Cervical Radiculopathy  Cervical radiculopathy happens when a nerve in the neck (a cervical nerve) is pinched or bruised. This condition can happen because of an injury to the cervical spine (vertebrae) in the neck, or as part of the normal aging process. Pressure on the cervical nerves can cause pain or numbness that travels from the neck all the way down into the arm and fingers. Usually, this condition gets better with rest. Treatment may be needed if the condition does not improve. What are the causes? This condition may be caused by:  A neck injury.  A bulging (herniated) disk.  Muscle spasms.  Muscle tightness in the neck because of overuse.  Arthritis.  Breakdown or degeneration in the bones and joints of the spine (spondylosis) due to aging.  Bone spurs that may develop near the cervical nerves. What are the signs or symptoms? Symptoms of this condition include:  Pain. The pain may travel from the neck to the arm and hand. The pain can be severe or irritating. It may be worse when you move your neck.  Numbness or tingling in your arm or hand.  Weakness in the affected arm and hand, in severe cases. How is this diagnosed? This condition may be diagnosed based on your symptoms, your medical history, and a physical exam. You may also have tests, including:  X-rays.  A CT scan.  An MRI.  An electromyogram (EMG).  Nerve conduction tests. How is this treated? In many cases, treatment is not needed for this condition. With rest, the condition usually gets better over time. If treatment is needed, options may include:  Wearing a soft neck collar (cervical collar) for short periods of time, as told by your health care provider.  Doing physical therapy to strengthen your neck muscles.  Taking medicines, such as NSAIDs or oral corticosteroids.  Having spinal injections, in severe cases.  Having surgery. This may be needed if other treatments do not help. Different types  of surgery may be done depending on the cause of this condition. Follow these instructions at home: If you have a cervical collar:  Wear it as told by your health care provider. Remove it only as told by your health care provider.  Ask your health care provider if you can remove the collar for cleaning and bathing. If you are allowed to remove the collar for cleaning or bathing: ? Follow instructions from your health care provider about how to remove the collar safely. ? Clean the collar by wiping it with mild soap and water and drying it completely. ? Take out any removable pads in the collar every 1-2 days, and wash them by hand with soap and water. Let them air-dry completely before you put them back in the collar. ? Check your skin under the collar for irritation or sores. If you see any, tell your health care provider. Managing pain      Take over-the-counter and prescription medicines only as told by your health care provider.  If directed, put ice on the affected area. ? If you have a soft neck collar, remove it as told by your health care provider. ? Put ice in a plastic bag. ? Place a towel between your skin and the bag. ? Leave the ice on for 20 minutes, 2-3 times a day.  If applying ice does not help, you can try using heat. Use the heat source that your health care provider recommends, such as a moist heat pack or a heating pad. ?  Place a towel between your skin and the heat source. °? Leave the heat on for 20-30 minutes. °? Remove the heat if your skin turns bright red. This is especially important if you are unable to feel pain, heat, or cold. You may have a greater risk of getting burned. °· Try a gentle neck and shoulder massage to help relieve symptoms. °Activity °· Rest as needed. °· Return to your normal activities as told by your health care provider. Ask your health care provider what activities are safe for you. °· Do stretching and strengthening exercises as told by  your health care provider or physical therapist. °· Do not lift anything that is heavier than 10 lb (4.5 kg) until your health care provider tells you that it is safe. °General instructions °· Use a flat pillow when you sleep. °· Do not drive while wearing a cervical collar. If you do not have a cervical collar, ask your health care provider if it is safe to drive while your neck heals. °· Ask your health care provider if the medicine prescribed to you requires you to avoid driving or using heavy machinery. °· Do not use any products that contain nicotine or tobacco, such as cigarettes, e-cigarettes, and chewing tobacco. These can delay healing. If you need help quitting, ask your health care provider. °· Keep all follow-up visits as told by your health care provider. This is important. °Contact a health care provider if: °· Your condition does not improve with treatment. °Get help right away if: °· Your pain gets much worse and cannot be controlled with medicines. °· You have weakness or numbness in your hand, arm, face, or leg. °· You have a high fever. °· You have a stiff, rigid neck. °· You lose control of your bowels or your bladder (have incontinence). °· You have trouble with walking, balance, or speaking. °Summary °· Cervical radiculopathy happens when a nerve in the neck is pinched or bruised. °· A nerve can get pinched from a bulging disk, arthritis, muscle spasms, or an injury to the neck. °· Symptoms include pain, tingling, or numbness radiating from the neck into the arm or hand. Weakness can also occur in severe cases. °· Treatment may include rest, wearing a cervical collar, and physical therapy. Medicines may be prescribed to help with pain. In severe cases, injections or surgery may be needed. °This information is not intended to replace advice given to you by your health care provider. Make sure you discuss any questions you have with your health care provider. °Document Revised: 02/03/2018  Document Reviewed: 02/03/2018 °Elsevier Patient Education © 2020 [employer] ° °

## 2019-12-26 NOTE — Progress Notes (Signed)
Established Patient Office Visit  Subjective:  Patient ID: Heidi Shah, female    DOB: 1945/07/06  Age: 74 y.o. MRN: [id number]  CC:  Chief Complaint  Patient presents with  . Neck Pain    back of left neck, pain radiates into left top of shoulder, started about a week ago    HPI Heidi Shah presents for left-sided neck pain.  Onset about 2 weeks ago.  No known injury.  She has pain that radiates toward the left shoulder region.  Last weekend she started applying ice over the left neck and left shoulder region and about 2 days ago she noticed some numbness of the left anterior shoulder.  She did have some towels over the ice pack but kept this relatively continuously cold for several hours.  She describes an achy pain relatively constant.  8 out of 10 severity at times.  Denies any numbness in the hand or forearm.  She has been taking ibuprofen for the pain without relief.  She does have pain with rotation of the neck and lateral bending especially to the left side.  No prior history of neck difficulties.  No recent imaging.  She has not noted any weakness.  No chest pain.  Past Medical History:  Diagnosis Date  . Cataract    surgery  . Chicken pox   . Colon polyps   . Depression   . Diverticulitis   . Diverticulosis   . Esophageal stricture   . GERD (gastroesophageal reflux disease)   . Heart murmur   . Hypertension     Past Surgical History:  Procedure Laterality Date  . cataract surgery Bilateral 1996  . EYE SURGERY  2000   detached L retina  . HERNIA REPAIR  1990  . NECK SURGERY     as a child    Family History  Problem Relation Age of Onset  . Heart disease Mother 47  . COPD Mother   . Crohn's disease Mother   . Heart disease Father 77  . Cancer Father        kidney  . Colon cancer Neg Hx   . Esophageal cancer Neg Hx   . Stomach cancer Neg Hx     Social History   Socioeconomic History  . Marital status: Divorced    Spouse name: Not on  file  . Number of children: 2  . Years of education: master's degree  . Highest education level: Master's degree (e.g., MA, MS, MEng, MEd, MSW, MBA)  Occupational History  . 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 Smoking status: Never Smoker  . Smokeless tobacco: Never Used  Vaping Use  . Vaping Use: Never used  Substance and Sexual Activity  . Alcohol use: Yes    Alcohol/week: 1.0 standard drink    Types: 1 Glasses of wine per week    Comment: one or two glasses of wine a couple times a week  . Drug use: No  . Sexual activity: Not on file  Other Topics Concern  . Not on file  Social History Narrative      Lives alone in ranch home.    2 children   Son lives in [place]   Continues to teach virtually part time   Social Determinants of Health   Financial Resource Strain: Low Risk   . Difficulty of Paying Living Expenses: Not very hard  Food Insecurity: No Food Insecurity  .  Worried About Charity fundraiser in the Last Year: Never true  . Ran Out of Food in the Last Year: Never true  Transportation Needs: No Transportation Needs  . Lack of Transportation (Medical): No  . Lack of Transportation (Non-Medical): No  Physical Activity: Sufficiently Active  . Days of Exercise per Week: 7 days  . Minutes of Exercise per Session: 90 min  Stress:   . Feeling of Stress : Not on file  Social Connections: Moderately Integrated  . Frequency of Communication with Friends and Family: More than three times a week  . Frequency of Social Gatherings with Friends and Family: Once a week  . Attends Religious Services: More than 4 times per year  . Active Member of Clubs or Organizations: Yes  . Attends Archivist Meetings: More than 4 times per year  . Marital Status: Divorced  Human resources officer Violence:   . Fear of Current or Ex-Partner: Not on file  . Emotionally Abused: Not on file  . Physically Abused: Not on file  . Sexually  Abused: Not on file    Outpatient Medications Prior to Visit  Medication Sig Dispense Refill  . aspirin 81 MG tablet Take 81 mg by mouth daily.    . Cholecalciferol (D3 ADULT PO) Take by mouth. daily    . FLUAD QUADRIVALENT 0.5 ML injection     . lisinopril-hydrochlorothiazide (ZESTORETIC) 20-12.5 MG tablet TAKE ONE TABLET BY MOUTH ONE TIME DAILY 90 tablet 1  . Multiple Vitamin (MULTI-VITAMIN) tablet Take by mouth.    . Polyvinyl Alcohol-Povidone PF (REFRESH) 1.4-0.6 % SOLN Apply to eye.    . saccharomyces boulardii (FLORASTOR) 250 MG capsule Take 250 mg by mouth 2 (two) times daily.    . vitamin C (ASCORBIC ACID) 500 MG tablet Take 500 mg by mouth daily.    Marland Kitchen VYZULTA 0.024 % SOLN      Facility-Administered Medications Prior to Visit  Medication Dose Route Frequency Provider Last Rate Last Admin  . 0.9 %  sodium chloride infusion  500 mL Intravenous Once Irene Shipper, MD        Allergies  Allergen Reactions  . Beeswax     Per skin test from dermatologist  . Cetrimonium Chloride [Cetrimide]     Per skin test from dermatologist  . Methylisothiazolinone     Per skin test from dermatologist  . Neomycin Sulfate [Neomycin]     Per skin test from dermatologist  . Nickel Sulfate [Nickel]     Per skin test from dermatologist  . Penicillins   . Propolis     Per skin test from dermatologist    ROS Review of Systems  Constitutional: Negative for chills and fever.  Respiratory: Negative for shortness of breath.   Cardiovascular: Negative for chest pain.  Musculoskeletal: Positive for neck pain.  Neurological: Negative for weakness and numbness.      Objective:    Physical Exam Vitals reviewed.  Constitutional:      Appearance: Normal appearance.  Neck:     Comments: He has some mild tenderness lower cervical region around C6-C7 but minimal.  Mild left trapezius tenderness. Cardiovascular:     Rate and Rhythm: Normal rate and regular rhythm.  Pulmonary:     Effort:  Pulmonary effort is normal.     Breath sounds: Normal breath sounds.  Musculoskeletal:     Cervical back: Neck supple.  Neurological:     Mental Status: She is alert.     Comments: She  has full strength upper extremities.  No evidence for muscle atrophy.  Deep tendon reflexes are 1+ and symmetric throughout upper extremities.  She does have some decreased sensation to touch left anterior shoulder but not involving the hand or forearm.     BP 138/78   Pulse 93   Temp 98.4 F (36.9 C) (Oral)   Ht 4\' 10"  (1.473 m)   Wt 116 lb 3.2 oz (52.7 kg)   SpO2 99%   BMI 24.29 kg/m  Wt Readings from Last 3 Encounters:  12/26/19 116 lb 3.2 oz (52.7 kg)  11/07/19 114 lb 12.8 oz (52.1 kg)  04/24/19 100 lb (45.4 kg)     There are no preventive care reminders to display for this patient.  There are no preventive care reminders to display for this patient.  Lab Results  Component Value Date   TSH 1.06 02/19/2019   Lab Results  Component Value Date   WBC 4.3 02/19/2019   HGB 13.1 02/19/2019   HCT 38.7 02/19/2019   MCV 94.2 02/19/2019   PLT 324.0 02/19/2019   Lab Results  Component Value Date   NA 137 06/26/2019   K 4.0 06/26/2019   CO2 30 06/26/2019   GLUCOSE 93 06/26/2019   BUN 12 06/26/2019   CREATININE 0.57 06/26/2019   BILITOT 0.6 02/19/2019   ALKPHOS 42 02/19/2019   AST 20 02/19/2019   ALT 11 02/19/2019   PROT 6.6 02/19/2019   ALBUMIN 4.2 02/19/2019   CALCIUM 9.4 06/26/2019   GFR 104.44 06/26/2019   Lab Results  Component Value Date   CHOL 261 (H) 02/19/2019   Lab Results  Component Value Date   HDL 88.90 02/19/2019   Lab Results  Component Value Date   LDLCALC 161 (H) 02/19/2019   Lab Results  Component Value Date   TRIG 57.0 02/19/2019   Lab Results  Component Value Date   CHOLHDL 3 02/19/2019   No results found for: HGBA1C    Assessment & Plan:   Left-sided cervical neck pain.  Suspect probable nerve root impingement.  She does not have any  weakness.  She is describing some numbness left anterior shoulder.  She did do some aggressive icing in this region and not clear if this is related.  -We discussed trial of prednisone taper and reviewed potential side effects. -We recommend she touch base in 1 week if not seeing any improvements and touch base sooner for any weakness, progressive pain, or other concerns -we discussed possible pain medication such as Tramadol and she declines unless pain worsens.  Meds ordered this encounter  Medications  . predniSONE (DELTASONE) 10 MG tablet    Sig: Taper as follows: 4-4-4-3-3-2-2-1-1    Dispense:  24 tablet    Refill:  0    Follow-up: No follow-ups on file.    Carolann Littler, MD

## 2020-01-10 ENCOUNTER — Telehealth: Payer: Self-pay | Admitting: Family Medicine

## 2020-01-10 ENCOUNTER — Other Ambulatory Visit: Payer: Self-pay

## 2020-01-10 ENCOUNTER — Ambulatory Visit (INDEPENDENT_AMBULATORY_CARE_PROVIDER_SITE_OTHER): Payer: Medicare HMO | Admitting: *Deleted

## 2020-01-10 DIAGNOSIS — M810 Age-related osteoporosis without current pathological fracture: Secondary | ICD-10-CM | POA: Diagnosis not present

## 2020-01-10 MED ORDER — DENOSUMAB 60 MG/ML ~~LOC~~ SOSY
60.0000 mg | PREFILLED_SYRINGE | Freq: Once | SUBCUTANEOUS | Status: AC
  Administered 2020-01-10: 60 mg via SUBCUTANEOUS

## 2020-01-10 NOTE — Telephone Encounter (Signed)
I recommend trial of low dose Gabapentin 100 mg po tid, disp #90) if she can tolerate, and if no better in 1-2 weeks will consider MRI

## 2020-01-10 NOTE — Telephone Encounter (Signed)
Patient states she is requesting a call back.  Dr. Elease Hashimoto stated after 2 weeks for pt to let him know how she is doing.  She isn't doing any better, still has pain in her neck and numbness in her shoulder.

## 2020-01-10 NOTE — Progress Notes (Signed)
Patient in office for Prolia injection. Prolia injection administered in right arm

## 2020-01-11 MED ORDER — GABAPENTIN 100 MG PO CAPS: 100.0000 mg | ORAL_CAPSULE | Freq: Three times a day (TID) | ORAL | 0 refills | Status: DC

## 2020-01-11 NOTE — Telephone Encounter (Signed)
Spoke with the pt and informed her of the message below.  Patient is aware the Rx was sent to her pharmacy and agreed to call back if needed.

## 2020-01-14 ENCOUNTER — Telehealth: Payer: Self-pay | Admitting: Family Medicine

## 2020-01-14 MED ORDER — GABAPENTIN 100 MG PO CAPS
100.0000 mg | ORAL_CAPSULE | Freq: Three times a day (TID) | ORAL | 0 refills | Status: DC
Start: 2020-01-14 — End: 2020-04-10

## 2020-01-14 NOTE — Telephone Encounter (Signed)
gabapentin (NEURONTIN) 100 MG capsule   [facility] [address] [place], [place]. [address] [place] Phone:  [phone]  Fax:  [phone]

## 2020-01-14 NOTE — Telephone Encounter (Signed)
Rx sent in

## 2020-01-23 ENCOUNTER — Other Ambulatory Visit: Payer: Medicare HMO

## 2020-01-23 DIAGNOSIS — Z20822 Contact with and (suspected) exposure to covid-19: Secondary | ICD-10-CM

## 2020-01-24 LAB — NOVEL CORONAVIRUS, NAA: SARS-CoV-2, NAA: NOT DETECTED

## 2020-01-24 LAB — SARS-COV-2, NAA 2 DAY TAT

## 2020-01-25 ENCOUNTER — Encounter: Payer: Self-pay | Admitting: Family Medicine

## 2020-01-25 DIAGNOSIS — M542 Cervicalgia: Secondary | ICD-10-CM

## 2020-01-28 NOTE — Telephone Encounter (Signed)
I want to start with plain films of cervical spine.  I have placed order and she will need to schedule to get the x-rays here at our office.   May eventually need MRI but will start with plain films.

## 2020-01-29 ENCOUNTER — Other Ambulatory Visit: Payer: Self-pay

## 2020-01-29 ENCOUNTER — Other Ambulatory Visit: Payer: Medicare HMO

## 2020-01-29 ENCOUNTER — Ambulatory Visit (INDEPENDENT_AMBULATORY_CARE_PROVIDER_SITE_OTHER): Payer: Medicare HMO

## 2020-01-29 DIAGNOSIS — M542 Cervicalgia: Secondary | ICD-10-CM | POA: Diagnosis not present

## 2020-02-01 NOTE — Progress Notes (Unsigned)
Discussed x-ray results with patient.  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  She has continued left-sided radiculitis symptoms which are not improved with medications.  We will set up MRI cervical spine to further assess.

## 2020-02-11 DIAGNOSIS — H401131 Primary open-angle glaucoma, bilateral, mild stage: Secondary | ICD-10-CM | POA: Diagnosis not present

## 2020-02-20 ENCOUNTER — Ambulatory Visit
Admission: RE | Admit: 2020-02-20 | Discharge: 2020-02-20 | Disposition: A | Discharge status: Home / Self Care | Payer: Medicare HMO | Source: Ambulatory Visit | Attending: Family Medicine | Admitting: Family Medicine

## 2020-02-20 ENCOUNTER — Other Ambulatory Visit: Payer: Self-pay

## 2020-02-20 DIAGNOSIS — M4802 Spinal stenosis, cervical region: Secondary | ICD-10-CM | POA: Diagnosis not present

## 2020-02-20 DIAGNOSIS — M542 Cervicalgia: Secondary | ICD-10-CM

## 2020-02-22 ENCOUNTER — Encounter: Payer: Medicare HMO | Admitting: Family Medicine

## 2020-02-24 NOTE — Addendum Note (Signed)
Addended by: Eulas Post on: 02/24/2020 07:07 PM   Modules accepted: Orders

## 2020-02-25 ENCOUNTER — Encounter: Payer: Self-pay | Admitting: Family Medicine

## 2020-02-25 ENCOUNTER — Other Ambulatory Visit: Payer: Self-pay

## 2020-02-25 ENCOUNTER — Ambulatory Visit (INDEPENDENT_AMBULATORY_CARE_PROVIDER_SITE_OTHER): Payer: Medicare HMO | Admitting: Family Medicine

## 2020-02-25 VITALS — BP 138/80 | HR 86 | Temp 98.1°F | Ht <= 58 in | Wt 118.4 lb

## 2020-02-25 DIAGNOSIS — Z23 Encounter for immunization: Secondary | ICD-10-CM | POA: Diagnosis not present

## 2020-02-25 DIAGNOSIS — Z0000 Encounter for general adult medical examination without abnormal findings: Secondary | ICD-10-CM | POA: Diagnosis not present

## 2020-02-25 DIAGNOSIS — I358 Other nonrheumatic aortic valve disorders: Secondary | ICD-10-CM

## 2020-02-25 NOTE — Progress Notes (Signed)
Established Patient Office Visit  Subjective:  Patient ID: Heidi Shah, female    DOB: Oct 07, 1945  Age: 74 y.o. MRN: [id number]  CC:  Chief Complaint  Patient presents with  . Annual Exam    CPE, would like to go over MRI results.     HPI Heidi Shah presents for physical exam.  Heidi Shah has had some recent cervical neck pains with left cervical radiculitis symptoms.  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  Symptomatically Heidi Shah is better at this time.  Heidi Shah states Heidi Shah has less tingling and less pain.  Heidi Shah is getting massage every 2 weeks which Heidi Shah thinks is helping.  No weakness.  We had discussed going ahead with neurosurgical consultation as Heidi Shah had had months of pain  Generally doing well at this time.  Heidi Shah has been walking a lot this year for exercise but has curtailed this past few weeks.  Her weight is back up about 15 pounds from last visit.  Health maintenance reviewed-needs flu vaccine.  Shingles complete.  Tetanus up-to-date.  Mammogram up-to-date.  Colonoscopy due 3/22.  Pneumonia vaccines complete.  Family history-Father had renal cell cancer.  He apparently also had coronary disease history but was a heavy smoker.  Mother had some type of heart problem which Heidi Shah thinks was atrial fibrillation.  Heidi Shah was only child.  Social history-Heidi Shah is divorced.  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  Non-smoker.  Past Medical History:  Diagnosis Date  . Cataract    surgery  . Chicken pox   . Colon polyps   . Depression   . Diverticulitis   . Diverticulosis   . Esophageal stricture   . GERD (gastroesophageal reflux disease)   . Heart murmur   . Hypertension     Past Surgical History:  Procedure Laterality Date  . cataract surgery Bilateral 1996  . EYE SURGERY  2000   detached L retina  . HERNIA REPAIR  1990  . NECK SURGERY     as a child    Family History  Problem Relation Age of Onset  . Heart  disease Mother 60  . COPD Mother   . Crohn's disease Mother   . Heart disease Father 24  . Cancer Father        kidney  . Colon cancer Neg Hx   . Esophageal cancer Neg Hx   . Stomach cancer Neg Hx     Social History   Socioeconomic History  . Marital status: Divorced    Spouse name: Not on file  . Number of children: 2  . Years of education: master's degree  . Highest education level: Master's degree (e.g., MA, MS, MEng, MEd, MSW, MBA)  Occupational History  . 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 Smoking status: Never Smoker  . Smokeless tobacco: Never Used  Vaping Use  . Vaping Use: Never used  Substance and Sexual Activity  . Alcohol use: Yes    Alcohol/week: 1.0 standard drink    Types: 1 Glasses of wine per week    Comment: one or two glasses of wine a couple times a week  . Drug use: No  . Sexual activity: Not on file  Other Topics Concern  . Not on file  Social History Narrative      Lives alone in ranch home.    2 children   Son lives in  [place]   Continues to teach virtually part time   Social Determinants of Radio broadcast assistant Strain: Low Risk   . Difficulty of Paying Living Expenses: Not very hard  Food Insecurity: No Food Insecurity  . Worried About Charity fundraiser in the Last Year: Never true  . Ran Out of Food in the Last Year: Never true  Transportation Needs: No Transportation Needs  . Lack of Transportation (Medical): No  . Lack of Transportation (Non-Medical): No  Physical Activity: Sufficiently Active  . Days of Exercise per Week: 7 days  . Minutes of Exercise per Session: 90 min  Stress:   . Feeling of Stress : Not on file  Social Connections: Moderately Integrated  . Frequency of Communication with Friends and Family: More than three times a week  . Frequency of Social Gatherings with Friends and Family: Once a week  . Attends Religious Services: More than 4 times per year  .  Active Member of Clubs or Organizations: Yes  . Attends Archivist Meetings: More than 4 times per year  . Marital Status: Divorced  Human resources officer Violence:   . Fear of Current or Ex-Partner: Not on file  . Emotionally Abused: Not on file  . Physically Abused: Not on file  . Sexually Abused: Not on file    Outpatient Medications Prior to Visit  Medication Sig Dispense Refill  . Cholecalciferol (D3 ADULT PO) Take by mouth. daily    . lisinopril-hydrochlorothiazide (ZESTORETIC) 20-12.5 MG tablet TAKE ONE TABLET BY MOUTH ONE TIME DAILY 90 tablet 1  . Multiple Vitamin (MULTI-VITAMIN) tablet Take by mouth.    . Polyvinyl Alcohol-Povidone PF (REFRESH) 1.4-0.6 % SOLN Apply to eye.    . saccharomyces boulardii (FLORASTOR) 250 MG capsule Take 250 mg by mouth 2 (two) times daily.    . vitamin C (ASCORBIC ACID) 500 MG tablet Take 500 mg by mouth daily.    Marland Kitchen VYZULTA 0.024 % SOLN     . aspirin 81 MG tablet Take 81 mg by mouth daily.    Marland Kitchen FLUAD QUADRIVALENT 0.5 ML injection     . gabapentin (NEURONTIN) 100 MG capsule Take 1 capsule (100 mg total) by mouth 3 (three) times daily. (Patient not taking: Reported on 02/25/2020) 90 capsule 0  . predniSONE (DELTASONE) 10 MG tablet Taper as follows: 4-4-4-3-3-2-2-1-1 (Patient not taking: Reported on 02/25/2020) 24 tablet 0   Facility-Administered Medications Prior to Visit  Medication Dose Route Frequency Provider Last Rate Last Admin  . 0.9 %  sodium chloride infusion  500 mL Intravenous Once Irene Shipper, MD        Allergies  Allergen Reactions  . Beeswax     Per skin test from dermatologist  . Cetrimonium Chloride [Cetrimide]     Per skin test from dermatologist  . Methylisothiazolinone     Per skin test from dermatologist  . Neomycin Sulfate [Neomycin]     Per skin test from dermatologist  . Nickel Sulfate [Nickel]     Per skin test from dermatologist  . Penicillins   . Propolis     Per skin test from dermatologist     ROS Review of Systems  Constitutional: Negative for activity change, appetite change, fatigue, fever and unexpected weight change.  HENT: Negative for ear pain, hearing loss, sore throat and trouble swallowing.   Eyes: Negative for visual disturbance.  Respiratory: Negative for cough and shortness of breath.   Cardiovascular: Negative for chest pain and  palpitations.  Gastrointestinal: Negative for abdominal pain, blood in stool, constipation and diarrhea.  Genitourinary: Negative for dysuria and hematuria.  Musculoskeletal: Positive for arthralgias. Negative for back pain and myalgias.  Skin: Negative for rash.  Neurological: Negative for dizziness, syncope and headaches.  Hematological: Negative for adenopathy.  Psychiatric/Behavioral: Negative for confusion and dysphoric mood.      Objective:    Physical Exam Constitutional:      Appearance: Heidi Shah is well-developed.  Eyes:     Pupils: Pupils are equal, round, and reactive to light.  Neck:     Thyroid: No thyromegaly.     Vascular: No JVD.  Cardiovascular:     Rate and Rhythm: Normal rate and regular rhythm.     Heart sounds: Murmur heard.  No gallop.      Comments: Heidi Shah has soft 2/6 systolic murmur right upper sternal border Pulmonary:     Effort: Pulmonary effort is normal. No respiratory distress.     Breath sounds: Normal breath sounds. No wheezing or rales.  Musculoskeletal:     Cervical back: Neck supple.  Neurological:     Mental Status: Heidi Shah is alert.     BP 138/80   Pulse 86   Temp 98.1 F (36.7 C) (Oral)   Ht 4\' 10"  (1.473 m)   Wt 118 lb 6.4 oz (53.7 kg)   SpO2 97%   BMI 24.75 kg/m  Wt Readings from Last 3 Encounters:  02/25/20 118 lb 6.4 oz (53.7 kg)  12/26/19 116 lb 3.2 oz (52.7 kg)  11/07/19 114 lb 12.8 oz (52.1 kg)     Health Maintenance Due  Topic Date Due  . INFLUENZA VACCINE  10/28/2019    There are no preventive care reminders to display for this patient.  Lab Results  Component  Value Date   TSH 1.06 02/19/2019   Lab Results  Component Value Date   WBC 4.3 02/19/2019   HGB 13.1 02/19/2019   HCT 38.7 02/19/2019   MCV 94.2 02/19/2019   PLT 324.0 02/19/2019   Lab Results  Component Value Date   NA 137 06/26/2019   K 4.0 06/26/2019   CO2 30 06/26/2019   GLUCOSE 93 06/26/2019   BUN 12 06/26/2019   CREATININE 0.57 06/26/2019   BILITOT 0.6 02/19/2019   ALKPHOS 42 02/19/2019   AST 20 02/19/2019   ALT 11 02/19/2019   PROT 6.6 02/19/2019   ALBUMIN 4.2 02/19/2019   CALCIUM 9.4 06/26/2019   GFR 104.44 06/26/2019   Lab Results  Component Value Date   CHOL 261 (H) 02/19/2019   Lab Results  Component Value Date   HDL 88.90 02/19/2019   Lab Results  Component Value Date   LDLCALC 161 (H) 02/19/2019   Lab Results  Component Value Date   TRIG 57.0 02/19/2019   Lab Results  Component Value Date   CHOLHDL 3 02/19/2019   No results found for: HGBA1C    Assessment & Plan:   Physical exam.  Patient has hypertension which is stable.  Heidi Shah has osteoporosis and is on Prolia injections every 6 months.  Overall doing fairly well.  We discussed the following health maintenance issues  -Flu vaccine given -Obtain follow-up screening labs per her request -Continue with annual mammogram -Repeat colonoscopy by next year  No orders of the defined types were placed in this encounter.   Follow-up: No follow-ups on file.    Carolann Littler, MD

## 2020-02-25 NOTE — Patient Instructions (Signed)
Preventive Care 38 Years and Older, Female Preventive care refers to lifestyle choices and visits with your health care provider that can promote health and wellness. This includes:  A yearly physical exam. This is also called an annual well check.  Regular dental and eye exams.  Immunizations.  Screening for certain conditions.  Healthy lifestyle choices, such as diet and exercise. What can I expect for my preventive care visit? Physical exam Your health care provider will check:  Height and weight. These may be used to calculate body mass index (BMI), which is a measurement that tells if you are at a healthy weight.  Heart rate and blood pressure.  Your skin for abnormal spots. Counseling Your health care provider may ask you questions about:  Alcohol, tobacco, and drug use.  Emotional well-being.  Home and relationship well-being.  Sexual activity.  Eating habits.  History of falls.  Memory and ability to understand (cognition).  Work and work Statistician.  Pregnancy and menstrual history. What immunizations do I need?  Influenza (flu) vaccine  This is recommended every year. Tetanus, diphtheria, and pertussis (Tdap) vaccine  You may need a Td booster every 10 years. Varicella (chickenpox) vaccine  You may need this vaccine if you have not already been vaccinated. Zoster (shingles) vaccine  You may need this after age 72. Pneumococcal conjugate (PCV13) vaccine  One dose is recommended after age 72. Pneumococcal polysaccharide (PPSV23) vaccine  One dose is recommended after age 72. Measles, mumps, and rubella (MMR) vaccine  You may need at least one dose of MMR if you were born in 1957 or later. You may also need a second dose. Meningococcal conjugate (MenACWY) vaccine  You may need this if you have certain conditions. Hepatitis A vaccine  You may need this if you have certain conditions or if you travel or work in places where you may be exposed  to hepatitis A. Hepatitis B vaccine  You may need this if you have certain conditions or if you travel or work in places where you may be exposed to hepatitis B. Haemophilus influenzae type b (Hib) vaccine  You may need this if you have certain conditions. You may receive vaccines as individual doses or as more than one vaccine together in one shot (combination vaccines). Talk with your health care provider about the risks and benefits of combination vaccines. What tests do I need? Blood tests  Lipid and cholesterol levels. These may be checked every 5 years, or more frequently depending on your overall health.  Hepatitis C test.  Hepatitis B test. Screening  Lung cancer screening. You may have this screening every year starting at age 72 if you have a 30-pack-year history of smoking and currently smoke or have quit within the past 15 years.  Colorectal cancer screening. All adults should have this screening starting at age 72 and continuing until age 15. Your health care provider may recommend screening at age 23 if you are at increased risk. You will have tests every 1-10 years, depending on your results and the type of screening test.  Diabetes screening. This is done by checking your blood sugar (glucose) after you have not eaten for a while (fasting). You may have this done every 1-3 years.  Mammogram. This may be done every 1-2 years. Talk with your health care provider about how often you should have regular mammograms.  BRCA-related cancer screening. This may be done if you have a family history of breast, ovarian, tubal, or peritoneal cancers.  Other tests  Sexually transmitted disease (STD) testing.  Bone density scan. This is done to screen for osteoporosis. You may have this done starting at age 72. Follow these instructions at home: Eating and drinking  Eat a diet that includes fresh fruits and vegetables, whole grains, lean protein, and low-fat dairy products. Limit  your intake of foods with high amounts of sugar, saturated fats, and salt.  Take vitamin and mineral supplements as recommended by your health care provider.  Do not drink alcohol if your health care provider tells you not to drink.  If you drink alcohol: ? Limit how much you have to 0-1 drink a day. ? Be aware of how much alcohol is in your drink. In the [place] one drink equals one 12 oz bottle of beer (355 mL), one 5 oz glass of wine (148 mL), or one 1 oz glass of hard liquor (44 mL). Lifestyle  Take daily care of your teeth and gums.  Stay active. Exercise for at least 30 minutes on 5 or more days each week.  Do not use any products that contain nicotine or tobacco, such as cigarettes, e-cigarettes, and chewing tobacco. If you need help quitting, ask your health care provider.  If you are sexually active, practice safe sex. Use a condom or other form of protection in order to prevent STIs (sexually transmitted infections).  Talk with your health care provider about taking a low-dose aspirin or statin. What's next?  Go to your health care provider once a year for a well check visit.  Ask your health care provider how often you should have your eyes and teeth checked.  Stay up to date on all vaccines. This information is not intended to replace advice given to you by your health care provider. Make sure you discuss any questions you have with your health care provider. Document Revised: 03/09/2018 Document Reviewed: 03/09/2018 Elsevier Patient Education  2020 [employer].

## 2020-02-26 LAB — CBC WITH DIFFERENTIAL/PLATELET
Absolute Monocytes: 521 {cells}/uL (ref 200–950)
Basophils Absolute: 68 {cells}/uL (ref 0–200)
Basophils Relative: 1.1 %
Eosinophils Absolute: 403 {cells}/uL (ref 15–500)
Eosinophils Relative: 6.5 %
HCT: 39.8 % (ref 35.0–45.0)
Hemoglobin: 13.3 g/dL (ref 11.7–15.5)
Lymphs Abs: 2207 {cells}/uL (ref 850–3900)
MCH: 31.3 pg (ref 27.0–33.0)
MCHC: 33.4 g/dL (ref 32.0–36.0)
MCV: 93.6 fL (ref 80.0–100.0)
MPV: 10 fL (ref 7.5–12.5)
Monocytes Relative: 8.4 %
Neutro Abs: 3001 {cells}/uL (ref 1500–7800)
Neutrophils Relative %: 48.4 %
Platelets: 320 Thousand/uL (ref 140–400)
RBC: 4.25 Million/uL (ref 3.80–5.10)
RDW: 12.1 % (ref 11.0–15.0)
Total Lymphocyte: 35.6 %
WBC: 6.2 Thousand/uL (ref 3.8–10.8)

## 2020-02-26 LAB — HEPATIC FUNCTION PANEL
AG Ratio: 2 (calc) (ref 1.0–2.5)
ALT: 11 U/L (ref 6–29)
AST: 19 U/L (ref 10–35)
Albumin: 4.1 g/dL (ref 3.6–5.1)
Alkaline phosphatase (APISO): 32 U/L — ABNORMAL LOW (ref 37–153)
Bilirubin, Direct: 0.1 mg/dL (ref 0.0–0.2)
Globulin: 2.1 g/dL (ref 1.9–3.7)
Indirect Bilirubin: 0.3 mg/dL (ref 0.2–1.2)
Total Bilirubin: 0.4 mg/dL (ref 0.2–1.2)
Total Protein: 6.2 g/dL (ref 6.1–8.1)

## 2020-02-26 LAB — LIPID PANEL
Cholesterol: 224 mg/dL — ABNORMAL HIGH
HDL: 95 mg/dL
LDL Cholesterol (Calc): 116 mg/dL — ABNORMAL HIGH
Non-HDL Cholesterol (Calc): 129 mg/dL
Total CHOL/HDL Ratio: 2.4 (calc)
Triglycerides: 50 mg/dL

## 2020-02-26 LAB — BASIC METABOLIC PANEL
BUN/Creatinine Ratio: 32 (calc) — ABNORMAL HIGH (ref 6–22)
BUN: 18 mg/dL (ref 7–25)
CO2: 29 mmol/L (ref 20–32)
Calcium: 9.3 mg/dL (ref 8.6–10.4)
Chloride: 105 mmol/L (ref 98–110)
Creat: 0.56 mg/dL — ABNORMAL LOW (ref 0.60–0.93)
Glucose, Bld: 86 mg/dL (ref 65–99)
Potassium: 3.9 mmol/L (ref 3.5–5.3)
Sodium: 141 mmol/L (ref 135–146)

## 2020-02-26 LAB — TSH: TSH: 0.84 mIU/L (ref 0.40–4.50)

## 2020-02-27 NOTE — Progress Notes (Signed)
Labs all look good.  No concerns.  Slightly elevated total cholesterol but excellent HDL.

## 2020-02-28 DIAGNOSIS — B373 Candidiasis of vulva and vagina: Secondary | ICD-10-CM | POA: Diagnosis not present

## 2020-03-07 ENCOUNTER — Ambulatory Visit: Payer: Medicare HMO | Admitting: Internal Medicine

## 2020-03-14 DIAGNOSIS — I10 Essential (primary) hypertension: Secondary | ICD-10-CM | POA: Diagnosis not present

## 2020-03-14 DIAGNOSIS — M4712 Other spondylosis with myelopathy, cervical region: Secondary | ICD-10-CM | POA: Diagnosis not present

## 2020-03-14 DIAGNOSIS — M542 Cervicalgia: Secondary | ICD-10-CM | POA: Insufficient documentation

## 2020-03-14 DIAGNOSIS — M4722 Other spondylosis with radiculopathy, cervical region: Secondary | ICD-10-CM | POA: Insufficient documentation

## 2020-03-14 DIAGNOSIS — M4312 Spondylolisthesis, cervical region: Secondary | ICD-10-CM | POA: Diagnosis not present

## 2020-03-19 ENCOUNTER — Other Ambulatory Visit: Payer: Self-pay | Admitting: Family Medicine

## 2020-03-19 DIAGNOSIS — Z1231 Encounter for screening mammogram for malignant neoplasm of breast: Secondary | ICD-10-CM

## 2020-03-25 ENCOUNTER — Other Ambulatory Visit: Payer: Self-pay | Admitting: Family Medicine

## 2020-03-29 HISTORY — PX: POLYPECTOMY: SHX149

## 2020-03-29 HISTORY — PX: COLONOSCOPY: SHX174

## 2020-03-29 HISTORY — PX: UPPER GASTROINTESTINAL ENDOSCOPY: SHX188

## 2020-04-10 ENCOUNTER — Ambulatory Visit: Payer: Medicare HMO | Admitting: Internal Medicine

## 2020-04-10 ENCOUNTER — Encounter: Payer: Self-pay | Admitting: Internal Medicine

## 2020-04-10 VITALS — BP 120/84 | HR 105 | Ht <= 58 in | Wt 121.6 lb

## 2020-04-10 DIAGNOSIS — K219 Gastro-esophageal reflux disease without esophagitis: Secondary | ICD-10-CM

## 2020-04-10 DIAGNOSIS — Z860100 Personal history of colon polyps, unspecified: Secondary | ICD-10-CM

## 2020-04-10 DIAGNOSIS — K222 Esophageal obstruction: Secondary | ICD-10-CM | POA: Diagnosis not present

## 2020-04-10 DIAGNOSIS — R1310 Dysphagia, unspecified: Secondary | ICD-10-CM | POA: Diagnosis not present

## 2020-04-10 MED ORDER — PLENVU 140 G PO SOLR: ORAL | 0 refills | Status: DC

## 2020-04-10 NOTE — Progress Notes (Signed)
HISTORY OF PRESENT ILLNESS:  Heidi Shah is a 75 y.o. female with GERD complicated by peptic stricture and a history of sessile serrated polyp who presents today for follow-up regarding complaints of worsening dysphagia and the need for surveillance colonoscopy.  She was last seen via virtual medicine October 04, 2018.  She had been on Nexium for reflux with good control symptoms.  However, she elected to stop PPI therapy stating concerns that it may affect her memory and bone density.  She did not consult GI regarding this.  She now treats her acid reflux with vinegar preparation.  She has had worsening intermittent solid food dysphagia since her last assessment.  She has had prior upper endoscopy with esophageal dilation May 2019.  54 French Maloney dilator.  Her last colonoscopy March 2017 revealed sessile serrated polyp.  Follow-up in 5 years omitted.  She denies lower GI complaints.  She has completed her COVID vaccination series  REVIEW OF SYSTEMS:  All non-GI ROS negative.  Past Medical History:  Diagnosis Date  . Cataract    surgery  . Chicken pox   . Colon polyps   . Depression   . Diverticulitis   . Diverticulosis   . Esophageal stricture   . GERD (gastroesophageal reflux disease)   . Heart murmur   . Hypertension     Past Surgical History:  Procedure Laterality Date  . cataract surgery Bilateral 1996  . EYE SURGERY  2000   detached L retina  . HERNIA REPAIR  1990  . NECK SURGERY     as a child    Social History [name]  reports that she has never smoked. She has never used smokeless tobacco. She reports current alcohol use of about 1.0 standard drink of alcohol per week. She reports that she does not use drugs.  family history includes COPD in her mother; Cancer in her father; Crohn's disease in her mother; Heart disease (age of onset: 52) in her mother; Heart disease (age of onset: 63) in her father.  Allergies  Allergen Reactions  . Beeswax     Per  skin test from dermatologist  . Cetrimonium Chloride [Cetrimide]     Per skin test from dermatologist  . Methylisothiazolinone     Per skin test from dermatologist  . Neomycin Sulfate [Neomycin]     Per skin test from dermatologist  . Nickel Sulfate [Nickel]     Per skin test from dermatologist  . Penicillins   . Propolis     Per skin test from dermatologist       PHYSICAL EXAMINATION: Vital signs: BP 120/84 (BP Location: Left Arm, Patient Position: Sitting)   Pulse (!) 105   Ht 4\' 10"  (1.473 m)   Wt 121 lb 9.6 oz (55.2 kg)   SpO2 99%   BMI 25.41 kg/m   Constitutional: generally well-appearing, no acute distress Psychiatric: alert and oriented x3, cooperative Eyes: extraocular movements intact, anicteric, conjunctiva pink Mouth: oral pharynx moist, no lesions Neck: supple no lymphadenopathy Cardiovascular: heart regular rate and rhythm, no murmur Lungs: clear to auscultation bilaterally Abdomen: soft, nontender, nondistended, no obvious ascites, no peritoneal signs, normal bowel sounds, no organomegaly Rectal: Deferred to colonoscopy Extremities: no clubbing, cyanosis, or lower extremity edema bilaterally Skin: no lesions on visible extremities Neuro: No focal deficits.  Cranial nerves intact  ASSESSMENT:  1.  Chronic GERD.  Currently off PPI therapy 2.  Worsening intermittent solid food dysphagia secondary to peptic stricture 3.  History of  sessile serrated polyp.  Due for follow-up.   PLAN:  1.  Reflux precautions 2.  Schedule upper endoscopy with esophageal dilation.The nature of the procedure, as well as the risks, benefits, and alternatives were carefully and thoroughly reviewed with the patient. Ample time for discussion and questions allowed. The patient understood, was satisfied, and agreed to proceed. 3.  To be determined if she needs PPI based on endoscopy findings 4.  Schedule surveillance colonoscopy.The nature of the procedure, as well as the risks,  benefits, and alternatives were carefully and thoroughly reviewed with the patient. Ample time for discussion and questions allowed. The patient understood, was satisfied, and agreed to proceed.

## 2020-04-10 NOTE — Patient Instructions (Signed)
We have sent the following medications to your pharmacy for you to pick up at your convenience: Plenvu.  Please pick up soon  You have been scheduled for an endoscopy and colonoscopy. Please follow the written instructions given to you at your visit today. Please pick up your prep supplies at the pharmacy within the next 1-3 days. If you use inhalers (even only as needed), please bring them with you on the day of your procedure.   Due to recent changes in healthcare laws, you may see the results of your imaging and laboratory studies on MyChart before your provider has had a chance to review them.  We understand that in some cases there may be results that are confusing or concerning to you. Not all laboratory results come back in the same time frame and the provider may be waiting for multiple results in order to interpret others.  Please give [place] 48 hours in order for your provider to thoroughly review all the results before contacting the office for clarification of your results.   If you are age 73 or older, your body mass index should be between 23-30. Your Body mass index is 25.41 kg/m. If this is out of the aforementioned range listed, please consider follow up with your Primary Care Provider.  If you are age 56 or younger, your body mass index should be between 19-25. Your Body mass index is 25.41 kg/m. If this is out of the aformentioned range listed, please consider follow up with your Primary Care Provider.

## 2020-04-24 ENCOUNTER — Ambulatory Visit (INDEPENDENT_AMBULATORY_CARE_PROVIDER_SITE_OTHER): Payer: Medicare HMO

## 2020-04-24 DIAGNOSIS — Z0000 Encounter for general adult medical examination without abnormal findings: Secondary | ICD-10-CM

## 2020-04-24 NOTE — Patient Instructions (Signed)
Heidi Shah , Thank you for taking time to come for your Medicare Wellness Visit. I appreciate your ongoing commitment to your health goals. Please review the following plan we discussed and let me know if I can assist you in the future.   Screening recommendations/referrals: Colonoscopy: Up to date, next due 06/08/2020 Mammogram: Currently due, please keep appointment on 04/28/2020  Bone Density: Up to date, next due 04/02/2021 Recommended yearly ophthalmology/optometry visit for glaucoma screening and checkup Recommended yearly dental visit for hygiene and checkup  Vaccinations: Influenza vaccine: Up to date, next due fall 2022  Pneumococcal vaccine: Completed series  Tdap vaccine: Up to date, next due 02/25/2026 Shingles vaccine: Completed series     Advanced directives: Please bring copies of your advanced directives into our office so that we may scan it into your chart.   Conditions/risks identified: None   Next appointment: 05/26/2020 @ 8:00 am with Dr.Burchette   Preventive Care 65 Years and Older, Female Preventive care refers to lifestyle choices and visits with your health care provider that can promote health and wellness. What does preventive care include?  A yearly physical exam. This is also called an annual well check.  Dental exams once or twice a year.  Routine eye exams. Ask your health care provider how often you should have your eyes checked.  Personal lifestyle choices, including:  Daily care of your teeth and gums.  Regular physical activity.  Eating a healthy diet.  Avoiding tobacco and drug use.  Limiting alcohol use.  Practicing safe sex.  Taking low-dose aspirin every day.  Taking vitamin and mineral supplements as recommended by your health care provider. What happens during an annual well check? The services and screenings done by your health care provider during your annual well check will depend on your age, overall health, lifestyle  risk factors, and family history of disease. Counseling  Your health care provider may ask you questions about your:  Alcohol use.  Tobacco use.  Drug use.  Emotional well-being.  Home and relationship well-being.  Sexual activity.  Eating habits.  History of falls.  Memory and ability to understand (cognition).  Work and work Statistician.  Reproductive health. Screening  You may have the following tests or measurements:  Height, weight, and BMI.  Blood pressure.  Lipid and cholesterol levels. These may be checked every 5 years, or more frequently if you are over 34 years old.  Skin check.  Lung cancer screening. You may have this screening every year starting at age 2 if you have a 30-pack-year history of smoking and currently smoke or have quit within the past 15 years.  Fecal occult blood test (FOBT) of the stool. You may have this test every year starting at age 57.  Flexible sigmoidoscopy or colonoscopy. You may have a sigmoidoscopy every 5 years or a colonoscopy every 10 years starting at age 2.  Hepatitis C blood test.  Hepatitis B blood test.  Sexually transmitted disease (STD) testing.  Diabetes screening. This is done by checking your blood sugar (glucose) after you have not eaten for a while (fasting). You may have this done every 1-3 years.  Bone density scan. This is done to screen for osteoporosis. You may have this done starting at age 23.  Mammogram. This may be done every 1-2 years. Talk to your health care provider about how often you should have regular mammograms. Talk with your health care provider about your test results, treatment options, and if necessary, the need  for more tests. Vaccines  Your health care provider may recommend certain vaccines, such as:  Influenza vaccine. This is recommended every year.  Tetanus, diphtheria, and acellular pertussis (Tdap, Td) vaccine. You may need a Td booster every 10 years.  Zoster vaccine.  You may need this after age 37.  Pneumococcal 13-valent conjugate (PCV13) vaccine. One dose is recommended after age 76.  Pneumococcal polysaccharide (PPSV23) vaccine. One dose is recommended after age 55. Talk to your health care provider about which screenings and vaccines you need and how often you need them. This information is not intended to replace advice given to you by your health care provider. Make sure you discuss any questions you have with your health care provider. Document Released: 04/11/2015 Document Revised: 12/03/2015 Document Reviewed: 01/14/2015 Elsevier Interactive Patient Education  2017 [employer] Prevention in the Home Falls can cause injuries. They can happen to people of all ages. There are many things you can do to make your home safe and to help prevent falls. What can I do on the outside of my home?  Regularly fix the edges of walkways and driveways and fix any cracks.  Remove anything that might make you trip as you walk through a door, such as a raised step or threshold.  Trim any bushes or trees on the path to your home.  Use bright outdoor lighting.  Clear any walking paths of anything that might make someone trip, such as rocks or tools.  Regularly check to see if handrails are loose or broken. Make sure that both sides of any steps have handrails.  Any raised decks and porches should have guardrails on the edges.  Have any leaves, snow, or ice cleared regularly.  Use sand or salt on walking paths during winter.  Clean up any spills in your garage right away. This includes oil or grease spills. What can I do in the bathroom?  Use night lights.  Install grab bars by the toilet and in the tub and shower. Do not use towel bars as grab bars.  Use non-skid mats or decals in the tub or shower.  If you need to sit down in the shower, use a plastic, non-slip stool.  Keep the floor dry. Clean up any water that spills on the floor as soon  as it happens.  Remove soap buildup in the tub or shower regularly.  Attach bath mats securely with double-sided non-slip rug tape.  Do not have throw rugs and other things on the floor that can make you trip. What can I do in the bedroom?  Use night lights.  Make sure that you have a light by your bed that is easy to reach.  Do not use any sheets or blankets that are too big for your bed. They should not hang down onto the floor.  Have a firm chair that has side arms. You can use this for support while you get dressed.  Do not have throw rugs and other things on the floor that can make you trip. What can I do in the kitchen?  Clean up any spills right away.  Avoid walking on wet floors.  Keep items that you use a lot in easy-to-reach places.  If you need to reach something above you, use a strong step stool that has a grab bar.  Keep electrical cords out of the way.  Do not use floor polish or wax that makes floors slippery. If you must use wax, use  non-skid floor wax.  Do not have throw rugs and other things on the floor that can make you trip. What can I do with my stairs?  Do not leave any items on the stairs.  Make sure that there are handrails on both sides of the stairs and use them. Fix handrails that are broken or loose. Make sure that handrails are as long as the stairways.  Check any carpeting to make sure that it is firmly attached to the stairs. Fix any carpet that is loose or worn.  Avoid having throw rugs at the top or bottom of the stairs. If you do have throw rugs, attach them to the floor with carpet tape.  Make sure that you have a light switch at the top of the stairs and the bottom of the stairs. If you do not have them, ask someone to add them for you. What else can I do to help prevent falls?  Wear shoes that:  Do not have high heels.  Have rubber bottoms.  Are comfortable and fit you well.  Are closed at the toe. Do not wear sandals.  If  you use a stepladder:  Make sure that it is fully opened. Do not climb a closed stepladder.  Make sure that both sides of the stepladder are locked into place.  Ask someone to hold it for you, if possible.  Clearly mark and make sure that you can see:  Any grab bars or handrails.  First and last steps.  Where the edge of each step is.  Use tools that help you move around (mobility aids) if they are needed. These include:  Canes.  Walkers.  Scooters.  Crutches.  Turn on the lights when you go into a dark area. Replace any light bulbs as soon as they burn out.  Set up your furniture so you have a clear path. Avoid moving your furniture around.  If any of your floors are uneven, fix them.  If there are any pets around you, be aware of where they are.  Review your medicines with your doctor. Some medicines can make you feel dizzy. This can increase your chance of falling. Ask your doctor what other things that you can do to help prevent falls. This information is not intended to replace advice given to you by your health care provider. Make sure you discuss any questions you have with your health care provider. Document Released: 01/09/2009 Document Revised: 08/21/2015 Document Reviewed: 04/19/2014 Elsevier Interactive Patient Education  2017 [employer].

## 2020-04-24 NOTE — Progress Notes (Addendum)
Subjective:   Heidi Shah is a 75 y.o. female who presents for Medicare Annual (Subsequent) preventive examination.  I connected with Heidi Shah today by telephone and verified that I am speaking with the correct person using two identifiers. Location patient: home Location provider: work Persons participating in the virtual visit: patient, provider.   I discussed the limitations, risks, security and privacy concerns of performing an evaluation and management service by telephone and the availability of in person appointments. I also discussed with the patient that there may be a patient responsible charge related to this service. The patient expressed understanding and verbally consented to this telephonic visit.    Interactive audio and video telecommunications were attempted between this provider and patient, however failed, due to patient having technical difficulties OR patient did not have access to video capability.  We continued and completed visit with audio only.      Review of Systems    N/A  Cardiac Risk Factors include: advanced age (>16men, >20 women)     Objective:    Today's Vitals   There is no height or weight on file to calculate BMI.  Advanced Directives 04/24/2020 04/24/2019 04/20/2018 04/17/2018 07/28/2017 03/04/2017  Does Patient Have a Medical Advance Directive? Yes No No No No No  Type of Paramedic of [place];Living will - - - - -  Does patient want to make changes to medical advance directive? No - Patient declined - - - - -  Would patient like information on creating a medical advance directive? - Yes (MAU/Ambulatory/Procedural Areas - Information given) No - Patient declined Yes (MAU/Ambulatory/Procedural Areas - Information given) - -    Current Medications (verified) Outpatient Encounter Medications as of 04/24/2020  Medication Sig  . Cholecalciferol (D3 ADULT PO) Take by mouth. daily  . FLUAD QUADRIVALENT 0.5 ML  injection   . lisinopril-hydrochlorothiazide (ZESTORETIC) 20-12.5 MG tablet TAKE ONE TABLET BY MOUTH ONE TIME DAILY  . Multiple Vitamin (MULTI-VITAMIN) tablet Take by mouth.  Marland Kitchen PEG-KCl-NaCl-NaSulf-Na Asc-C (PLENVU) 140 g SOLR Take per colonoscopy instructions  . Polyvinyl Alcohol-Povidone PF (REFRESH) 1.4-0.6 % SOLN Apply to eye.  . vitamin C (ASCORBIC ACID) 500 MG tablet Take 500 mg by mouth daily.  Marland Kitchen VYZULTA 0.024 % SOLN   . aspirin 81 MG tablet Take 81 mg by mouth daily.   Facility-Administered Encounter Medications as of 04/24/2020  Medication  . 0.9 %  sodium chloride infusion    Allergies (verified) Beeswax, Cetrimonium chloride [cetrimide], Methylisothiazolinone, Neomycin sulfate [neomycin], Nickel sulfate [nickel], Penicillins, and Propolis   History: Past Medical History:  Diagnosis Date  . Cataract    surgery  . Chicken pox   . Colon polyps   . Depression   . Diverticulitis   . Diverticulosis   . Esophageal stricture   . GERD (gastroesophageal reflux disease)   . Heart murmur   . Hypertension    Past Surgical History:  Procedure Laterality Date  . cataract surgery Bilateral 1996  . EYE SURGERY  2000   detached L retina  . HERNIA REPAIR  1990  . NECK SURGERY     as a child   Family History  Problem Relation Age of Onset  . Heart disease Mother 57  . COPD Mother   . Crohn's disease Mother   . Heart disease Father 68  . Cancer Father        kidney  . Colon cancer Neg Hx   . Esophageal cancer Neg Hx   .  Stomach cancer Neg Hx    Social History   Socioeconomic History  . Marital status: Divorced    Spouse name: Not on file  . Number of children: 2  . Years of education: master's degree  . Highest education level: Master's degree (e.g., MA, MS, MEng, MEd, MSW, MBA)  Occupational History  . 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 Smoking status: Never Smoker  . Smokeless tobacco: Never Used  Vaping Use  . Vaping  Use: Never used  Substance and Sexual Activity  . Alcohol use: Yes    Alcohol/week: 1.0 standard drink    Types: 1 Glasses of wine per week    Comment: one or two glasses of wine a couple times a week  . Drug use: No  . Sexual activity: Not on file  Other Topics Concern  . Not on file  Social History Narrative      Lives alone in ranch home.    2 children   Son lives in [place]   Continues to teach virtually part time   Social Determinants of Health   Financial Resource Strain: Low Risk   . Difficulty of Paying Living Expenses: Not hard at all  Food Insecurity: Not on file  Transportation Needs: No Transportation Needs  . Lack of Transportation (Medical): No  . Lack of Transportation (Non-Medical): No  Physical Activity: Inactive  . Days of Exercise per Week: 0 days  . Minutes of Exercise per Session: 0 min  Stress: No Stress Concern Present  . Feeling of Stress : Not at all  Social Connections: Moderately Integrated  . Frequency of Communication with Friends and Family: More than three times a week  . Frequency of Social Gatherings with Friends and Family: Twice a week  . Attends Religious Services: More than 4 times per year  . Active Member of Clubs or Organizations: Yes  . Attends Archivist Meetings: More than 4 times per year  . Marital Status: Divorced    Tobacco Counseling Counseling given: Not Answered   Clinical Intake:  Pre-visit preparation completed: Yes  Pain : No/denies pain     Nutritional Risks: None Diabetes: No  How often do you need to have someone help you when you read instructions, pamphlets, or other written materials from your doctor or pharmacy?: 1 - Never  Diabetic?No      Information entered by :: [place] of Daily Living In your present state of health, do you have any difficulty performing the following activities: 04/24/2020  Hearing? N  Vision? N  Difficulty concentrating or making decisions?  N  Walking or climbing stairs? N  Dressing or bathing? N  Doing errands, shopping? N  Preparing Food and eating ? N  Using the Toilet? N  In the past six months, have you accidently leaked urine? N  Do you have problems with loss of bowel control? N  Managing your Medications? N  Managing your Finances? N  Housekeeping or managing your Housekeeping? N  Some recent data might be hidden    Patient Care Team: Eulas Post, MD as PCP - General (Family Medicine) Kevan Rosebush, MD as Referring Physician (Dermatology) [place], Heather, Pitman (Optometry) Irene Shipper, MD as Consulting Physician (Gastroenterology)  Indicate any recent Medical Services you may have received from other than [facility] providers in the past year (date may be approximate).     Assessment:   This  is a routine wellness examination for Heidi Shah.  Hearing/Vision screen  Hearing Screening   125Hz  250Hz  500Hz  1000Hz  2000Hz  3000Hz  4000Hz  6000Hz  8000Hz   Right ear:           Left ear:           Vision Screening Comments: Patient states gets eyes examined every 3 months.  Patient has glaucoma   Dietary issues and exercise activities discussed: Current Exercise Habits: The patient does not participate in regular exercise at present, Exercise limited by: None identified  Goals    . avoid covid    . Exercise 3x per week (30 min per time)    . Patient Stated     Eat calcium rich foods for osteoporosis treatment since you are unable to tolerate calcium supplements      Depression Screen PHQ 2/9 Scores 04/24/2020 02/25/2020 04/24/2019 02/19/2019 04/17/2018 02/17/2018 03/04/2017  PHQ - 2 Score 0 1 0 0 0 0 0  PHQ- 9 Score - - - 0 0 - -    Fall Risk Fall Risk  04/24/2020 02/25/2020 04/24/2019 02/19/2019 04/17/2018  Falls in the past year? 0 0 0 0 1  Number falls in past yr: 0 - - - 0  Injury with Fall? 0 - - - 1  Risk for fall due to : No Fall Risks - Medication side effect - History of fall(s);Impaired  balance/gait;Impaired vision  Follow up Falls evaluation completed;Falls prevention discussed - - - Education provided;Falls prevention discussed    FALL RISK PREVENTION PERTAINING TO THE HOME:  Any stairs in or around the home? Yes  If so, are there any without handrails? No  Home free of loose throw rugs in walkways, pet beds, electrical cords, etc? Yes  Adequate lighting in your home to reduce risk of falls? Yes   ASSISTIVE DEVICES UTILIZED TO PREVENT FALLS:  Life alert? No  Use of a cane, walker or w/c? No  Grab bars in the bathroom? No  Shower chair or bench in shower? No  Elevated toilet seat or a handicapped toilet? No    Cognitive Function:  Normal cognitive status assessed by direct observation by this Nurse Health Advisor. No abnormalities found.   MMSE - Mini Mental State Exam 03/04/2017  Not completed: (No Data)     6CIT Screen 04/24/2019  What Year? 0 points  What month? 0 points  What time? 0 points  Count back from 20 0 points  Months in reverse 0 points  Repeat phrase 0 points  Total Score 0    Immunizations Immunization History  Administered Date(s) Administered  . Fluad Quad(high Dose 65+) 01/05/2019, 02/25/2020  . Influenza, High Dose Seasonal PF 02/03/2015, 02/13/2016, 01/09/2017, 01/02/2018  . Influenza,inj,Quad PF,6+ Mos 12/27/2012, 12/26/2013  . Influenza-Unspecified 01/09/2017, 01/05/2019  . PFIZER(Purple Top)SARS-COV-2 Vaccination 05/19/2019, 06/19/2019  . Pneumococcal Conjugate-13 01/01/2014  . Pneumococcal Polysaccharide-23 12/27/2012  . Td 08/27/2005  . Tdap 02/26/2016  . Zoster Recombinat (Shingrix) 02/20/2019, 10/01/2019    TDAP status: Up to date  Flu Vaccine status: Up to date  Pneumococcal vaccine status: Up to date  Covid-19 vaccine status: Completed vaccines  Qualifies for Shingles Vaccine? Yes   Zostavax completed No   Shingrix Completed?: Yes  Screening Tests Health Maintenance  Topic Date Due  . COVID-19 Vaccine  (3 - Booster for Pfizer series) 12/20/2019  . Hepatitis C Screening  02/12/2026 (Originally Jul 11, 1945)  . PNA vac Low Risk Adult (2 of 2 - PPSV23) 04/12/2046 (Originally 12/27/2017)  . COLONOSCOPY (Pts  45-69yrs Insurance coverage will need to be confirmed)  06/08/2020  . MAMMOGRAM  04/11/2021  . TETANUS/TDAP  02/25/2026  . INFLUENZA VACCINE  Completed  . DEXA SCAN  Completed    Health Maintenance  Health Maintenance Due  Topic Date Due  . COVID-19 Vaccine (3 - Booster for Pfizer series) 12/20/2019    Colorectal cancer screening: Type of screening: Colonoscopy. Completed 06/09/2015. Repeat every 5 years  Mammogram status: Ordered 03/19/2020. Pt provided with contact info and advised to call to schedule appt.   Bone Density status: Completed 04/03/2019. Results reflect: Bone density results: OSTEOPOROSIS. Repeat every 2 years.  Lung Cancer Screening: (Low Dose CT Chest recommended if Age 14-80 years, 30 pack-year currently smoking OR have quit w/in 15years.) does not qualify.   Lung Cancer Screening Referral: N/A   Additional Screening:  Hepatitis C Screening: does qualify;   Vision Screening: Recommended annual ophthalmology exams for early detection of glaucoma and other disorders of the eye. Is the patient up to date with their annual eye exam?  Yes  Who is the provider or what is the name of the office in which the patient attends annual eye exams? Dr. [place]  If pt is not established with a provider, would they like to be referred to a provider to establish care? No .   Dental Screening: Recommended annual dental exams for proper oral hygiene  Community Resource Referral / Chronic Care Management: CRR required this visit?  No   CCM required this visit?  No      Plan:     I have personally reviewed and noted the following in the patient's chart:   . Medical and social history . Use of alcohol, tobacco or illicit drugs  . Current medications and  supplements . Functional ability and status . Nutritional status . Physical activity . Advanced directives . List of other physicians . Hospitalizations, surgeries, and ER visits in previous 12 months . Vitals . Screenings to include cognitive, depression, and falls . Referrals and appointments  In addition, I have reviewed and discussed with patient certain preventive protocols, quality metrics, and best practice recommendations. A written personalized care plan for preventive services as well as general preventive health recommendations were provided to patient.     Ofilia Neas, LPN   09/25/5282   Nurse Notes: None

## 2020-04-28 ENCOUNTER — Other Ambulatory Visit: Payer: Self-pay

## 2020-04-28 ENCOUNTER — Ambulatory Visit
Admission: RE | Admit: 2020-04-28 | Discharge: 2020-04-28 | Disposition: A | Discharge status: Home / Self Care | Payer: Medicare HMO | Source: Ambulatory Visit | Attending: Family Medicine | Admitting: Family Medicine

## 2020-04-28 DIAGNOSIS — Z1231 Encounter for screening mammogram for malignant neoplasm of breast: Secondary | ICD-10-CM | POA: Diagnosis not present

## 2020-05-26 ENCOUNTER — Encounter: Payer: Self-pay | Admitting: Family Medicine

## 2020-05-26 ENCOUNTER — Ambulatory Visit (INDEPENDENT_AMBULATORY_CARE_PROVIDER_SITE_OTHER): Payer: Medicare HMO | Admitting: Family Medicine

## 2020-05-26 ENCOUNTER — Other Ambulatory Visit: Payer: Self-pay

## 2020-05-26 ENCOUNTER — Ambulatory Visit (INDEPENDENT_AMBULATORY_CARE_PROVIDER_SITE_OTHER): Payer: Medicare HMO

## 2020-05-26 VITALS — BP 138/82 | HR 78 | Ht <= 58 in | Wt 119.0 lb

## 2020-05-26 DIAGNOSIS — I351 Nonrheumatic aortic (valve) insufficiency: Secondary | ICD-10-CM

## 2020-05-26 DIAGNOSIS — R0600 Dyspnea, unspecified: Secondary | ICD-10-CM

## 2020-05-26 NOTE — Progress Notes (Signed)
Established Patient Office Visit  Subjective:  Patient ID: Heidi Shah, female    DOB: 1945/04/17  Age: 75 y.o. MRN: [id number]  CC:  Chief Complaint  Patient presents with  . Shortness of Breath    [place] occationally and usually during/after exertion    HPI Heidi Shah presents for some recent mild dyspnea with exertion.  She gives example that she was taking groceries in from her car recently noticed some mild dyspnea.  No dizziness.  No syncope.  No chest pain.  She has been riding her exercise bike without difficulty.  She also has noted couple of occasions recently when climbing stairs that she has had some shortness of breath which again was relatively mild and not associated with any chest tightness.  She does have aortic heart murmur and no previous echocardiogram.  She has not had previous complaints of dyspnea or dizziness.  She does have hypertension as well as GERD history.  Overall feels good.  Denies any recent cough.  No wheezing.  No peripheral edema.  No orthopnea.  No pleuritic pain.  No asymmetric leg edema.  Family history significant for father having CABG in his 42s.  Mom had history of some type of arrhythmia but no history of CAD.  Past Medical History:  Diagnosis Date  . Cataract    surgery  . Chicken pox   . Colon polyps   . Depression   . Diverticulitis   . Diverticulosis   . Esophageal stricture   . GERD (gastroesophageal reflux disease)   . Heart murmur   . Hypertension     Past Surgical History:  Procedure Laterality Date  . cataract surgery Bilateral 1996  . EYE SURGERY  2000   detached L retina  . HERNIA REPAIR  1990  . NECK SURGERY     as a child    Family History  Problem Relation Age of Onset  . Heart disease Mother 23  . COPD Mother   . Crohn's disease Mother   . Heart disease Father 31  . Cancer Father        kidney  . Colon cancer Neg Hx   . Esophageal cancer Neg Hx   . Stomach cancer Neg Hx     Social  History   Socioeconomic History  . Marital status: Divorced    Spouse name: Not on file  . Number of children: 2  . Years of education: master's degree  . Highest education level: Master's degree (e.g., MA, MS, MEng, MEd, MSW, MBA)  Occupational History  . 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 Smoking status: Never Smoker  . Smokeless tobacco: Never Used  Vaping Use  . Vaping Use: Never used  Substance and Sexual Activity  . Alcohol use: Yes    Alcohol/week: 1.0 standard drink    Types: 1 Glasses of wine per week    Comment: one or two glasses of wine a couple times a week  . Drug use: No  . Sexual activity: Not on file  Other Topics Concern  . Not on file  Social History Narrative      Lives alone in ranch home.    2 children   Son lives in [place]   Continues to teach virtually part time   Social Determinants of Health   Financial Resource Strain: Low Risk   . Difficulty of Paying Living Expenses: Not hard at all  Food  Insecurity: Not on file  Transportation Needs: No Transportation Needs  . Lack of Transportation (Medical): No  . Lack of Transportation (Non-Medical): No  Physical Activity: Inactive  . Days of Exercise per Week: 0 days  . Minutes of Exercise per Session: 0 min  Stress: No Stress Concern Present  . Feeling of Stress : Not at all  Social Connections: Moderately Integrated  . Frequency of Communication with Friends and Family: More than three times a week  . Frequency of Social Gatherings with Friends and Family: Twice a week  . Attends Religious Services: More than 4 times per year  . Active Member of Clubs or Organizations: Yes  . Attends Archivist Meetings: More than 4 times per year  . Marital Status: Divorced  Human resources officer Violence: Not At Risk  . Fear of Current or Ex-Partner: No  . Emotionally Abused: No  . Physically Abused: No  . Sexually Abused: No    Outpatient Medications  Prior to Visit  Medication Sig Dispense Refill  . Cholecalciferol (D3 ADULT PO) Take by mouth. daily    . lisinopril-hydrochlorothiazide (ZESTORETIC) 20-12.5 MG tablet TAKE ONE TABLET BY MOUTH ONE TIME DAILY 90 tablet 1  . Multiple Vitamin (MULTI-VITAMIN) tablet Take by mouth.    Marland Kitchen PEG-KCl-NaCl-NaSulf-Na Asc-C (PLENVU) 140 g SOLR Take per colonoscopy instructions 1 each 0  . Polyvinyl Alcohol-Povidone PF (REFRESH) 1.4-0.6 % SOLN Apply to eye.    . vitamin C (ASCORBIC ACID) 500 MG tablet Take 500 mg by mouth daily.    Marland Kitchen VYZULTA 0.024 % SOLN     . aspirin 81 MG tablet Take 81 mg by mouth daily.    Marland Kitchen FLUAD QUADRIVALENT 0.5 ML injection      Facility-Administered Medications Prior to Visit  Medication Dose Route Frequency Provider Last Rate Last Admin  . 0.9 %  sodium chloride infusion  500 mL Intravenous Once Irene Shipper, MD        Allergies  Allergen Reactions  . Beeswax     Per skin test from dermatologist  . Cetrimonium Chloride [Cetrimide]     Per skin test from dermatologist  . Methylisothiazolinone     Per skin test from dermatologist  . Neomycin Sulfate [Neomycin]     Per skin test from dermatologist  . Nickel Sulfate [Nickel]     Per skin test from dermatologist  . Penicillins   . Propolis     Per skin test from dermatologist    ROS Review of Systems  Constitutional: Negative for fatigue and unexpected weight change.  Eyes: Negative for visual disturbance.  Respiratory: Positive for shortness of breath. Negative for cough, chest tightness and wheezing.   Cardiovascular: Negative for chest pain, palpitations and leg swelling.  Neurological: Negative for dizziness, seizures, syncope, weakness, light-headedness and headaches.      Objective:    Physical Exam Constitutional:      Appearance: She is well-developed and well-nourished.  Eyes:     Pupils: Pupils are equal, round, and reactive to light.  Neck:     Thyroid: No thyromegaly.     Vascular: No JVD.   Cardiovascular:     Rate and Rhythm: Normal rate and regular rhythm.     Heart sounds: Murmur heard.  No gallop.      Comments: He has 2/6 to 3/6 systolic murmur right upper sternal border Pulmonary:     Effort: Pulmonary effort is normal. No respiratory distress.     Breath sounds: Normal breath sounds.  No wheezing or rales.  Musculoskeletal:        General: No edema.     Cervical back: Neck supple.     Right lower leg: No edema.     Left lower leg: No edema.  Neurological:     Mental Status: She is alert.     BP 138/82   Pulse 78   Ht 4\' 10"  (1.473 m)   Wt 119 lb (54 kg)   SpO2 95%   BMI 24.87 kg/m  Wt Readings from Last 3 Encounters:  05/26/20 119 lb (54 kg)  04/10/20 121 lb 9.6 oz (55.2 kg)  02/25/20 118 lb 6.4 oz (53.7 kg)     Health Maintenance Due  Topic Date Due  . COVID-19 Vaccine (3 - Booster for Pfizer series) 12/20/2019    There are no preventive care reminders to display for this patient.  Lab Results  Component Value Date   TSH 0.84 02/25/2020   Lab Results  Component Value Date   WBC 6.2 02/25/2020   HGB 13.3 02/25/2020   HCT 39.8 02/25/2020   MCV 93.6 02/25/2020   PLT 320 02/25/2020   Lab Results  Component Value Date   NA 141 02/25/2020   K 3.9 02/25/2020   CO2 29 02/25/2020   GLUCOSE 86 02/25/2020   BUN 18 02/25/2020   CREATININE 0.56 (L) 02/25/2020   BILITOT 0.4 02/25/2020   ALKPHOS 42 02/19/2019   AST 19 02/25/2020   ALT 11 02/25/2020   PROT 6.2 02/25/2020   ALBUMIN 4.2 02/19/2019   CALCIUM 9.3 02/25/2020   GFR 104.44 06/26/2019   Lab Results  Component Value Date   CHOL 224 (H) 02/25/2020   Lab Results  Component Value Date   HDL 95 02/25/2020   Lab Results  Component Value Date   LDLCALC 116 (H) 02/25/2020   Lab Results  Component Value Date   TRIG 50 02/25/2020   Lab Results  Component Value Date   CHOLHDL 2.4 02/25/2020   No results found for: HGBA1C    Assessment & Plan:   Patient presents with  recent onset mild dyspnea with exertion.  No associated dizziness or syncope.  She does have aortic systolic murmur and needs further evaluation.  We will set up echocardiogram.  Obtain PA and lateral chest x-ray.  EKG today in office shows sinus rhythm with no acute changes- -consider cardiology consultation based on echocardiogram results.  We have low clinical suspicion for significant coronary ischemia  No orders of the defined types were placed in this encounter.   Follow-up: No follow-ups on file.    Carolann Littler, MD

## 2020-05-26 NOTE — Patient Instructions (Signed)
Shortness of Breath, Adult Shortness of breath is when a person has trouble breathing enough air or when a person feels like she or he is having trouble breathing in enough air. Shortness of breath could be a sign of a medical problem. Follow these instructions at home:  Pay attention to any changes in your symptoms.  Do not use any products that contain nicotine or tobacco, such as cigarettes, e-cigarettes, and chewing tobacco.  Do not smoke. Smoking is a common cause of shortness of breath. If you need help quitting, ask your health care provider.  Avoid things that can irritate your airways, such as: ? Mold. ? Dust. ? Air pollution. ? Chemical fumes. ? Things that can cause allergy symptoms (allergens), if you have allergies.  Keep your living space clean and free of mold and dust.  Rest as needed. Slowly return to your usual activities.  Take over-the-counter and prescription medicines only as told by your health care provider. This includes oxygen therapy and inhaled medicines.  Keep all follow-up visits as told by your health care provider. This is important.   Contact a health care provider if:  Your condition does not improve as soon as expected.  You have a hard time doing your normal activities, even after you rest.  You have new symptoms. Get help right away if:  Your shortness of breath gets worse.  You have shortness of breath when you are resting.  You feel light-headed or you faint.  You have a cough that is not controlled with medicines.  You cough up blood.  You have pain with breathing.  You have pain in your chest, arms, shoulders, or abdomen.  You have a fever.  You cannot walk up stairs or exercise the way that you normally do. These symptoms may represent a serious problem that is an emergency. Do not wait to see if the symptoms will go away. Get medical help right away. Call your local emergency services (911 in the [place] Do not drive yourself  to the hospital. Summary  Shortness of breath is when a person has trouble breathing enough air. It can be a sign of a medical problem.  Avoid things that irritate your lungs, such as smoking, pollution, mold, and dust.  Pay attention to changes in your symptoms and contact your health care provider if you have a hard time completing daily activities because of shortness of breath. This information is not intended to replace advice given to you by your health care provider. Make sure you discuss any questions you have with your health care provider. Document Revised: 08/15/2017 Document Reviewed: 08/15/2017 Elsevier Patient Education  2021 [employer].

## 2020-05-30 ENCOUNTER — Telehealth: Payer: Self-pay | Admitting: Family Medicine

## 2020-05-30 NOTE — Telephone Encounter (Signed)
Pt is calling in stating that she would like to get her Prolia shot but when it is due in April she will be out of town (April 14-20).  Pt would like to have a call back.

## 2020-06-09 DIAGNOSIS — H401131 Primary open-angle glaucoma, bilateral, mild stage: Secondary | ICD-10-CM | POA: Diagnosis not present

## 2020-06-13 ENCOUNTER — Encounter: Payer: Self-pay | Admitting: Internal Medicine

## 2020-06-16 ENCOUNTER — Ambulatory Visit (AMBULATORY_SURGERY_CENTER): Payer: Medicare HMO | Admitting: Internal Medicine

## 2020-06-16 ENCOUNTER — Encounter: Payer: Self-pay | Admitting: Internal Medicine

## 2020-06-16 ENCOUNTER — Other Ambulatory Visit: Payer: Self-pay

## 2020-06-16 VITALS — BP 116/82 | HR 86 | Temp 98.4°F | Resp 17 | Ht <= 58 in | Wt 121.0 lb

## 2020-06-16 DIAGNOSIS — Z860100 Personal history of colon polyps, unspecified: Secondary | ICD-10-CM

## 2020-06-16 DIAGNOSIS — K5730 Diverticulosis of large intestine without perforation or abscess without bleeding: Secondary | ICD-10-CM

## 2020-06-16 DIAGNOSIS — K2980 Duodenitis without bleeding: Secondary | ICD-10-CM | POA: Diagnosis not present

## 2020-06-16 DIAGNOSIS — R1310 Dysphagia, unspecified: Secondary | ICD-10-CM

## 2020-06-16 DIAGNOSIS — K222 Esophageal obstruction: Secondary | ICD-10-CM

## 2020-06-16 DIAGNOSIS — K259 Gastric ulcer, unspecified as acute or chronic, without hemorrhage or perforation: Secondary | ICD-10-CM

## 2020-06-16 DIAGNOSIS — K319 Disease of stomach and duodenum, unspecified: Secondary | ICD-10-CM

## 2020-06-16 DIAGNOSIS — K219 Gastro-esophageal reflux disease without esophagitis: Secondary | ICD-10-CM | POA: Diagnosis not present

## 2020-06-16 DIAGNOSIS — K3189 Other diseases of stomach and duodenum: Secondary | ICD-10-CM | POA: Diagnosis not present

## 2020-06-16 MED ORDER — SODIUM CHLORIDE 0.9 % IV SOLN: 500.0000 mL | Freq: Once | INTRAVENOUS | Status: DC

## 2020-06-16 MED ORDER — OMEPRAZOLE 40 MG PO CPDR: 40.0000 mg | DELAYED_RELEASE_CAPSULE | Freq: Every day | ORAL | 11 refills | Status: DC

## 2020-06-16 NOTE — Op Note (Signed)
[facility] Patient Name: Heidi Shah Procedure Date: 06/16/2020 9:49 AM MRN: [id number] Endoscopist: Docia Chuck. Henrene Pastor , MD Age: 75 Referring MD:  Date of Birth: 05/05/1945 Gender: Female Account #: [id number] Procedure:                Colonoscopy Indications:              High risk colon cancer surveillance: Personal                            history of sessile serrated colon polyp (less than                            10 mm in size) with no dysplasia. Previous                            examinations 2007, 2017 Medicines:                Monitored Anesthesia Care Procedure:                Pre-Anesthesia Assessment:                           - Prior to the procedure, a History and Physical                            was performed, and patient medications and                            allergies were reviewed. The patient's tolerance of                            previous anesthesia was also reviewed. The risks                            and benefits of the procedure and the sedation                            options and risks were discussed with the patient.                            All questions were answered, and informed consent                            was obtained. Prior Anticoagulants: The patient has                            taken no previous anticoagulant or antiplatelet                            agents. ASA Grade Assessment: II - A patient with                            mild systemic disease. After reviewing the risks  and benefits, the patient was deemed in                            satisfactory condition to undergo the procedure.                           After obtaining informed consent, the colonoscope                            was passed under direct vision. Throughout the                            procedure, the patient's blood pressure, pulse, and                            oxygen saturations were monitored  continuously. The                            Olympus CF-HQ190L [id number]) Colonoscope was                            introduced through the anus and advanced to the the                            cecum, identified by appendiceal orifice and                            ileocecal valve. The ileocecal valve, appendiceal                            orifice, and rectum were photographed. The quality                            of the bowel preparation was excellent. The                            colonoscopy was performed without difficulty. The                            patient tolerated the procedure well. The bowel                            preparation used was SUPREP via split dose                            instruction. Scope In: 9:58:16 AM Scope Out: 10:09:56 AM Scope Withdrawal Time: 0 hours 7 minutes 46 seconds  Total Procedure Duration: 0 hours 11 minutes 40 seconds  Findings:                 Multiple diverticula were found in the left colon.                           The exam was otherwise without abnormality on  direct and retroflexion views. Complications:            No immediate complications. Estimated blood loss:                            None. Estimated Blood Loss:     Estimated blood loss: none. Impression:               - Diverticulosis in the left colon.                           - The examination was otherwise normal on direct                            and retroflexion views.                           - No specimens collected. Recommendation:           - Repeat colonoscopy is not recommended for                            surveillance.                           - Patient has a contact number available for                            emergencies. The signs and symptoms of potential                            delayed complications were discussed with the                            patient. Return to normal activities tomorrow.                             Written discharge instructions were provided to the                            patient.                           - Resume previous diet.                           - Continue present medications. Docia Chuck. Henrene Pastor, MD 06/16/2020 10:13:22 AM This report has been signed electronically.

## 2020-06-16 NOTE — Progress Notes (Signed)
pt tolerated well. VSS. awake and to recovery. Report given to RN. techremoved bite block. No trauma.

## 2020-06-16 NOTE — Progress Notes (Signed)
Called to room to assist during endoscopic procedure.  Patient ID and intended procedure confirmed with present staff. Received instructions for my participation in the procedure from the performing physician.  

## 2020-06-16 NOTE — Progress Notes (Signed)
Ar - Check-in  CW - VS

## 2020-06-16 NOTE — Op Note (Signed)
[facility] Patient Name: Heidi Shah Procedure Date: 06/16/2020 9:49 AM MRN: [id number] Endoscopist: Docia Chuck. Henrene Pastor , MD Age: 75 Referring MD:  Date of Birth: [birth year] Gender: Female Account #: [id number] Procedure:                Upper GI endoscopy with biopsies; with esophageal                            dilation. [address] [place] Indications:              Dysphagia, Esophageal reflux Medicines:                Monitored Anesthesia Care Procedure:                Pre-Anesthesia Assessment:                           - Prior to the procedure, a History and Physical                            was performed, and patient medications and                            allergies were reviewed. The patient's tolerance of                            previous anesthesia was also reviewed. The risks                            and benefits of the procedure and the sedation                            options and risks were discussed with the patient.                            All questions were answered, and informed consent                            was obtained. Prior Anticoagulants: The patient has                            taken no previous anticoagulant or antiplatelet                            agents. ASA Grade Assessment: II - A patient with                            mild systemic disease. After reviewing the risks                            and benefits, the patient was deemed in                            satisfactory condition to undergo the procedure.  After obtaining informed consent, the endoscope was                            passed under direct vision. Throughout the                            procedure, the patient's blood pressure, pulse, and                            oxygen saturations were monitored continuously. The                            Endoscope was introduced through the mouth, and                            advanced to the  third part of duodenum. The upper                            GI endoscopy was accomplished without difficulty.                            The patient tolerated the procedure well. Scope In: Scope Out: Findings:                 The esophagus revealed large caliber ringlike                            stricture. No esophagitis. After completing the                            endoscopic survey the scope was withdrawn. Dilation                            was performed with a Maloney dilator with no                            resistance at 64 Fr. relook EGD revealed no trauma.                           The stomach revealed multiple prepyloric ulcers.                            Biopsies were taken with a cold forceps for                            histology.                           The examined duodenum revealed villous tissue                            emanating distally of the ampulla. Biopsies were                            taken with a  cold forceps for histology to rule out                            adenoma.                           The cardia and gastric fundus were normal on                            retroflexion save hiatal hernia. Complications:            No immediate complications. Estimated Blood Loss:     Estimated blood loss: none. Impression:               1. Esophageal stricture status post dilation                           2. GERD                           3. Multiple prepyloric ulcers                           4. Questionable adenomatous tissue in the duodenum. Recommendation:           .                           1. Patient has a contact number available for                            emergencies. The signs and symptoms of potential                            delayed complications were discussed with the                            patient. Return to normal activities tomorrow.                            Written discharge instructions were provided to the                             patient.                           2. Post dilation diet.                           3. Prescribe omeprazole 40 mg daily; #30; 11 refills                           4. Avoid unnecessary NSAIDs (medicines like                            ibuprofen, Advil, Aleve).  5. Await pathology results.                           6. Office follow-up with Dr. Henrene Pastor in 4 to 6 weeks Docia Chuck. Henrene Pastor, MD 06/16/2020 10:32:47 AM This report has been signed electronically.

## 2020-06-16 NOTE — Patient Instructions (Addendum)
HANDOUT ON DIVERTICULOSIS GIVEN TO YOU TODAY  AVOID NSAIDS  SUCH AS IBUPROFEN ,ALEVE,ADVIL,ETC  FOLLOW DILATATION DIET GIVEN TO YOU TODAY   AWAIT BIOPSY OF STOMACH RESULTS    YOU HAD AN ENDOSCOPIC PROCEDURE TODAY AT [facility]:   Refer to the procedure report that was given to you for any specific questions about what was found during the examination.  If the procedure report does not answer your questions, please call your gastroenterologist to clarify.  If you requested that your care partner not be given the details of your procedure findings, then the procedure report has been included in a sealed envelope for you to review at your convenience later.  YOU SHOULD EXPECT: Some feelings of bloating in the abdomen. Passage of more gas than usual.  Walking can help get rid of the air that was put into your GI tract during the procedure and reduce the bloating. If you had a lower endoscopy (such as a colonoscopy or flexible sigmoidoscopy) you may notice spotting of blood in your stool or on the toilet paper. If you underwent a bowel prep for your procedure, you may not have a normal bowel movement for a few days.  Please Note:  You might notice some irritation and congestion in your nose or some drainage.  This is from the oxygen used during your procedure.  There is no need for concern and it should clear up in a day or so.  SYMPTOMS TO REPORT IMMEDIATELY:   Following lower endoscopy (colonoscopy or flexible sigmoidoscopy):  Excessive amounts of blood in the stool  Significant tenderness or worsening of abdominal pains  Swelling of the abdomen that is new, acute  Fever of 100F or higher   Following upper endoscopy (EGD)  Vomiting of blood or coffee ground material  New chest pain or pain under the shoulder blades  Painful or persistently difficult swallowing  New shortness of breath  Fever of 100F or higher  Black, tarry-looking stools  For urgent or emergent  issues, a gastroenterologist can be reached at any hour by calling [phone]. Do not use MyChart messaging for urgent concerns.    DIET: FOLLOW DILATATION DIET GIVEN TO YOU TODAY.  Drink plenty of fluids but you should avoid alcoholic beverages for 24 hours.  ACTIVITY:  You should plan to take it easy for the rest of today and you should NOT DRIVE or use heavy machinery until tomorrow (because of the sedation medicines used during the test).    FOLLOW UP: Our staff will call the number listed on your records 48-72 hours following your procedure to check on you and address any questions or concerns that you may have regarding the information given to you following your procedure. If we do not reach you, we will leave a message.  We will attempt to reach you two times.  During this call, we will ask if you have developed any symptoms of COVID 19. If you develop any symptoms (ie: fever, flu-like symptoms, shortness of breath, cough etc.) before then, please call [phone].  If you test positive for Covid 19 in the 2 weeks post procedure, please call and report this information to [place].    If any biopsies were taken you will be contacted by phone or by letter within the next 1-3 weeks.  Please call us at [phone] if you have not heard about the biopsies in 3 weeks.    SIGNATURES/CONFIDENTIALITY: You and/or your care partner have signed  paperwork which will be entered into your electronic medical record.  These signatures attest to the fact that that the information above on your After Visit Summary has been reviewed and is understood.  Full responsibility of the confidentiality of this discharge information lies with you and/or your care-partner.

## 2020-06-17 ENCOUNTER — Telehealth: Payer: Self-pay

## 2020-06-17 NOTE — Telephone Encounter (Signed)
Note sent to scheduling admin pool for OV to be scheduled per office policy.

## 2020-06-18 ENCOUNTER — Telehealth: Payer: Self-pay

## 2020-06-18 NOTE — Telephone Encounter (Signed)
NO ANSWER, MESSAGE LEFT FOR PATIENT. 

## 2020-06-18 NOTE — Telephone Encounter (Signed)
  Follow up Call-  Call back number 06/16/2020  Post procedure Call Back phone  # [phone] cell  Permission to leave phone message Yes  Some recent data might be hidden     Patient questions:  Do you have a fever, pain , or abdominal swelling? No. Pain Score  0 *  Have you tolerated food without any problems? Yes.    Have you been able to return to your normal activities? Yes.    Do you have any questions about your discharge instructions: Diet   No. Medications  No. Follow up visit  No.  Do you have questions or concerns about your Care? No.  Actions: * If pain score is 4 or above: No action needed, pain <4.  1. Have you developed a fever since your procedure? no  2.   Have you had an respiratory symptoms (SOB or cough) since your procedure? no  3.   Have you tested positive for COVID 19 since your procedure no  4.   Have you had any family members/close contacts diagnosed with the COVID 19 since your procedure?  no   If yes to any of these questions please route to Joylene John, RN and Joella Prince, RN

## 2020-06-19 NOTE — Telephone Encounter (Signed)
PA started: Key: [employer]

## 2020-06-19 NOTE — Telephone Encounter (Signed)
Spoke with patient and Prolia will need a PA.  I will call patient back to schedule after PA is completed

## 2020-06-20 ENCOUNTER — Ambulatory Visit (HOSPITAL_COMMUNITY): Payer: Medicare HMO | Attending: Cardiology

## 2020-06-20 ENCOUNTER — Encounter: Payer: Self-pay | Admitting: *Deleted

## 2020-06-20 ENCOUNTER — Other Ambulatory Visit: Payer: Self-pay

## 2020-06-20 DIAGNOSIS — R0600 Dyspnea, unspecified: Secondary | ICD-10-CM | POA: Diagnosis not present

## 2020-06-20 DIAGNOSIS — I351 Nonrheumatic aortic (valve) insufficiency: Secondary | ICD-10-CM | POA: Diagnosis not present

## 2020-06-20 LAB — ECHOCARDIOGRAM COMPLETE
Area-P 1/2: 2.87 cm2
S' Lateral: 2.1 cm

## 2020-06-20 NOTE — Telephone Encounter (Signed)
Patient would like to apply for the grant. Waiting on patient's call.  If approved, okay to schedule.

## 2020-06-20 NOTE — Telephone Encounter (Signed)
Prolia approved 06/19/20 to 03/28/21

## 2020-06-25 ENCOUNTER — Encounter: Payer: Self-pay | Admitting: Internal Medicine

## 2020-07-04 NOTE — Telephone Encounter (Signed)
Prolia scheduled for 07/17/20

## 2020-07-17 ENCOUNTER — Other Ambulatory Visit: Payer: Self-pay

## 2020-07-17 ENCOUNTER — Ambulatory Visit (INDEPENDENT_AMBULATORY_CARE_PROVIDER_SITE_OTHER): Payer: Medicare HMO | Admitting: *Deleted

## 2020-07-17 DIAGNOSIS — M810 Age-related osteoporosis without current pathological fracture: Secondary | ICD-10-CM | POA: Diagnosis not present

## 2020-07-17 MED ORDER — DENOSUMAB 60 MG/ML ~~LOC~~ SOSY
60.0000 mg | PREFILLED_SYRINGE | Freq: Once | SUBCUTANEOUS | Status: AC
  Administered 2020-07-17: 60 mg via SUBCUTANEOUS

## 2020-07-17 NOTE — Progress Notes (Signed)
Per orders of Dr. Hernandez, injection of Prolia given by Brinna Divelbiss. Patient tolerated injection well. 

## 2020-08-01 ENCOUNTER — Ambulatory Visit: Payer: Medicare HMO | Admitting: Internal Medicine

## 2020-08-01 ENCOUNTER — Encounter: Payer: Self-pay | Admitting: Internal Medicine

## 2020-08-01 VITALS — BP 116/80 | HR 88 | Ht <= 58 in | Wt 122.0 lb

## 2020-08-01 DIAGNOSIS — K222 Esophageal obstruction: Secondary | ICD-10-CM

## 2020-08-01 DIAGNOSIS — K259 Gastric ulcer, unspecified as acute or chronic, without hemorrhage or perforation: Secondary | ICD-10-CM

## 2020-08-01 DIAGNOSIS — K219 Gastro-esophageal reflux disease without esophagitis: Secondary | ICD-10-CM | POA: Diagnosis not present

## 2020-08-01 DIAGNOSIS — K5900 Constipation, unspecified: Secondary | ICD-10-CM

## 2020-08-01 MED ORDER — OMEPRAZOLE 40 MG PO CPDR: 40.0000 mg | DELAYED_RELEASE_CAPSULE | Freq: Every day | ORAL | 3 refills | Status: DC

## 2020-08-01 NOTE — Progress Notes (Signed)
HISTORY OF PRESENT ILLNESS:  Heidi Shah is a 75 y.o. female who presents today for follow-up of regarding GERD complicated by peptic stricture, gastric ulcers, and surveillance colonoscopy.  Patient was last seen June 16, 2020.  Colonoscopy and upper endoscopy.  She has a history of sessile serrated polyps.  The most recent colonoscopy was normal except for left-sided diverticulosis. Surveillance recommended due to age.  Upper endoscopy revealed esophageal stricture which was dilated with 54 French Maloney dilator.  She was also noted to have multiple prepyloric ulcers and questionable adenomatous tissue of the duodenum.  Biopsies revealed reactive gastropathy.  No evidence for Helicobacter pylori.  Duodenal biopsies revealed peptic duodenitis.  No adenomatous tissue.  She was prescribed omeprazole 40 mg daily and asked to avoid NSAIDs.  Routine office follow-up at this time requested.  Patient tells me that on PPI she has had no reflux symptoms.  No recurrent dysphagia.  She is pleased.  We reviewed her endoscopic findings and pathology.  She also complains of constipation and bloating.  She inquires about possible therapies.  She does tell me that apple cider vinegar seems to help her bowels and bloating.  She wonders if this would be okay to take regularly.  REVIEW OF SYSTEMS:  All non-GI ROS negative unless otherwise stated in the HPI.  Past Medical History:  Diagnosis Date  . Allergy   . Anxiety   . Cataract    surgery - Bilateral cataracts removed  . Chicken pox   . Colon polyps   . Depression   . Diverticulitis   . Diverticulosis   . Esophageal stricture   . GERD (gastroesophageal reflux disease)   . Heart murmur   . Hypertension     Past Surgical History:  Procedure Laterality Date  . cataract surgery Bilateral 1996  . COLONOSCOPY    . EYE SURGERY  2000   detached L retina  . HERNIA REPAIR  1990  . NECK SURGERY     as a child  . POLYPECTOMY    . UPPER  GASTROINTESTINAL ENDOSCOPY      Social History Noelie Renfrow Runions  reports that she has never smoked. She has never used smokeless tobacco. She reports current alcohol use of about 1.0 standard drink of alcohol per week. She reports that she does not use drugs.  family history includes COPD in her mother; Cancer in her father; Crohn's disease in her mother; Heart disease (age of onset: 39) in her mother; Heart disease (age of onset: 38) in her father.  Allergies  Allergen Reactions  . Beeswax     Per skin test from dermatologist  . Cetrimonium Chloride [Cetrimide]     Per skin test from dermatologist  . Methylisothiazolinone     Per skin test from dermatologist  . Neomycin Sulfate [Neomycin]     Per skin test from dermatologist  . Nickel Sulfate [Nickel]     Per skin test from dermatologist  . Penicillins   . Propolis     Per skin test from dermatologist       PHYSICAL EXAMINATION: Vital signs: BP 116/80   Pulse 88   Ht 4\' 10"  (1.473 m)   Wt 122 lb (55.3 kg)   BMI 25.50 kg/m   Constitutional: generally well-appearing, no acute distress Psychiatric: alert and oriented x3, cooperative Eyes: Anicteric Mouth: Mask Abdomen: Not reexamined Skin: no lesions on visible extremities.  Normal hue Neuro: No gross deficits  ASSESSMENT:  1.  GERD complicated by peptic  stricture.  Asymptomatic post dilation on PPI 2.  Multiple NSAID related gastric ulcers. 3.  Peptic duodenitis.  No adenomatous tissue 4.  History of sessile serrated polyp.  Most recent colonoscopy with diverticulosis only   PLAN:  1.  Reflux precautions 2.  Continue omeprazole 40 mg daily.  Medication risks reviewed 3.  Minimize NSAID use 4.  Citrucel 2 tablespoons daily for constipation 5.  Routine office follow-up 1 year.  Contact the office in the interim for any questions or problems

## 2020-08-01 NOTE — Patient Instructions (Signed)
We have sent the following medications to your pharmacy for you to pick up at your convenience:   You may use 2 tablespoons of Citrucel in water or juice daily  Please follow up in one year

## 2020-09-01 DIAGNOSIS — S1086XA Insect bite of other specified part of neck, initial encounter: Secondary | ICD-10-CM | POA: Diagnosis not present

## 2020-09-01 DIAGNOSIS — L821 Other seborrheic keratosis: Secondary | ICD-10-CM | POA: Diagnosis not present

## 2020-09-01 DIAGNOSIS — L814 Other melanin hyperpigmentation: Secondary | ICD-10-CM | POA: Diagnosis not present

## 2020-09-21 ENCOUNTER — Other Ambulatory Visit: Payer: Self-pay | Admitting: Family Medicine

## 2020-10-08 DIAGNOSIS — D3131 Benign neoplasm of right choroid: Secondary | ICD-10-CM | POA: Diagnosis not present

## 2021-01-08 ENCOUNTER — Telehealth: Payer: Self-pay | Admitting: Family Medicine

## 2021-01-08 DIAGNOSIS — M810 Age-related osteoporosis without current pathological fracture: Secondary | ICD-10-CM

## 2021-01-08 NOTE — Telephone Encounter (Signed)
Pt is calling and would like to sch her 3rd prolia injection pt is aware Judson Roch is out of office today

## 2021-01-13 NOTE — Telephone Encounter (Signed)
-   patient had dexa scan done 04/03/19 osteoporosis - needs vitamin D & calcium checked - waiting on benefits verification from insurance  Dr. Elease Hashimoto,  - okay to order labs for vitamin d and calcium? Thanks!

## 2021-01-13 NOTE — Telephone Encounter (Signed)
Submitted BV, waiting response.

## 2021-01-14 NOTE — Telephone Encounter (Signed)
I spoke with patient. Her labs have been ordered & she will come in tomorrow to have them drawn. Patient is aware I'm waiting on a response from insurance before we can schedule. She is aware I will call her as soon as I have a response.

## 2021-01-14 NOTE — Telephone Encounter (Signed)
I received the BV back for patient.  - estimated cost is $230 for Prolia - estimated admin fee is $25 - submitted PA for Prolia to [facility]  - waiting on determination

## 2021-01-15 ENCOUNTER — Other Ambulatory Visit (INDEPENDENT_AMBULATORY_CARE_PROVIDER_SITE_OTHER): Payer: Medicare HMO

## 2021-01-15 ENCOUNTER — Other Ambulatory Visit: Payer: Self-pay

## 2021-01-15 DIAGNOSIS — M810 Age-related osteoporosis without current pathological fracture: Secondary | ICD-10-CM

## 2021-01-15 LAB — CALCIUM: Calcium: 9.6 mg/dL (ref 8.4–10.5)

## 2021-01-15 LAB — VITAMIN D 25 HYDROXY (VIT D DEFICIENCY, FRACTURES): VITD: 46.44 ng/mL (ref 30.00–100.00)

## 2021-01-23 NOTE — Telephone Encounter (Signed)
Patient is scheduled for 11/8 at 9am and aware of cost.

## 2021-01-26 DIAGNOSIS — H401131 Primary open-angle glaucoma, bilateral, mild stage: Secondary | ICD-10-CM | POA: Diagnosis not present

## 2021-02-02 ENCOUNTER — Other Ambulatory Visit: Payer: Self-pay

## 2021-02-03 ENCOUNTER — Ambulatory Visit: Payer: Medicare HMO

## 2021-02-12 DIAGNOSIS — L820 Inflamed seborrheic keratosis: Secondary | ICD-10-CM | POA: Diagnosis not present

## 2021-02-27 ENCOUNTER — Ambulatory Visit (INDEPENDENT_AMBULATORY_CARE_PROVIDER_SITE_OTHER): Payer: Medicare HMO | Admitting: Family Medicine

## 2021-02-27 ENCOUNTER — Encounter: Payer: Self-pay | Admitting: Family Medicine

## 2021-02-27 VITALS — BP 138/80 | HR 79 | Temp 98.4°F | Ht 59.45 in | Wt 125.8 lb

## 2021-02-27 DIAGNOSIS — Z0000 Encounter for general adult medical examination without abnormal findings: Secondary | ICD-10-CM | POA: Diagnosis not present

## 2021-02-27 DIAGNOSIS — M810 Age-related osteoporosis without current pathological fracture: Secondary | ICD-10-CM

## 2021-02-27 DIAGNOSIS — Z23 Encounter for immunization: Secondary | ICD-10-CM | POA: Diagnosis not present

## 2021-02-27 LAB — BASIC METABOLIC PANEL
BUN: 14 mg/dL (ref 6–23)
CO2: 30 mEq/L (ref 19–32)
Calcium: 9.5 mg/dL (ref 8.4–10.5)
Chloride: 99 mEq/L (ref 96–112)
Creatinine, Ser: 0.64 mg/dL (ref 0.40–1.20)
GFR: 87.9 mL/min (ref >60.00)
Glucose, Bld: 90 mg/dL (ref 70–99)
Potassium: 3.8 mEq/L (ref 3.5–5.1)
Sodium: 137 mEq/L (ref 135–145)

## 2021-02-27 LAB — CBC WITH DIFFERENTIAL/PLATELET
Basophils Absolute: 0.1 10*3/uL (ref 0.0–0.1)
Basophils Relative: 1.8 % (ref 0.0–3.0)
Eosinophils Absolute: 0.6 10*3/uL (ref 0.0–0.7)
Eosinophils Relative: 12.7 % — ABNORMAL HIGH (ref 0.0–5.0)
HCT: 40.9 % (ref 36.0–46.0)
Hemoglobin: 13.7 g/dL (ref 12.0–15.0)
Lymphocytes Relative: 40.8 % (ref 12.0–46.0)
Lymphs Abs: 1.8 10*3/uL (ref 0.7–4.0)
MCHC: 33.5 g/dL (ref 30.0–36.0)
MCV: 93.5 fl (ref 78.0–100.0)
Monocytes Absolute: 0.5 10*3/uL (ref 0.1–1.0)
Monocytes Relative: 11.1 % (ref 3.0–12.0)
Neutro Abs: 1.5 10*3/uL (ref 1.4–7.7)
Neutrophils Relative %: 33.6 % — ABNORMAL LOW (ref 43.0–77.0)
Platelets: 284 10*3/uL (ref 150.0–400.0)
RBC: 4.38 Mil/uL (ref 3.87–5.11)
RDW: 12.8 % (ref 11.5–15.5)
WBC: 4.4 10*3/uL (ref 4.0–10.5)

## 2021-02-27 LAB — HEPATIC FUNCTION PANEL
ALT: 13 U/L (ref 0–35)
AST: 20 U/L (ref 0–37)
Albumin: 4.4 g/dL (ref 3.5–5.2)
Alkaline Phosphatase: 39 U/L (ref 39–117)
Bilirubin, Direct: 0.1 mg/dL (ref 0.0–0.3)
Total Bilirubin: 0.5 mg/dL (ref 0.2–1.2)
Total Protein: 6.8 g/dL (ref 6.0–8.3)

## 2021-02-27 LAB — LIPID PANEL
Cholesterol: 259 mg/dL — ABNORMAL HIGH (ref 0–200)
HDL: 86.3 mg/dL (ref >39.00)
LDL Cholesterol: 157 mg/dL — ABNORMAL HIGH (ref 0–99)
NonHDL: 172.66
Total CHOL/HDL Ratio: 3
Triglycerides: 77 mg/dL (ref 0.0–149.0)
VLDL: 15.4 mg/dL (ref 0.0–40.0)

## 2021-02-27 LAB — TSH: TSH: 1.11 u[IU]/mL (ref 0.35–5.50)

## 2021-02-27 NOTE — Progress Notes (Signed)
Established Patient Office Visit  Subjective:  Patient ID: Heidi Shah, female    DOB: 18-Nov-1945  Age: 75 y.o. MRN: [id number]  CC:  Chief Complaint  Patient presents with   Annual Exam    HPI Heidi Shah presents for physical examination.  Generally doing well.  Has not had any recent neck pain.  Has not been walking as much recently.  Generally feels well.  She has had some family stress issues she is dealing with.  One of her daughters has had some recent marital difficulties.  Patient does not feel depressed though.  Has had occasional small leakage of stool.  No urine incontinence.  No perianal anesthesia.  She is not sure how much fiber she is getting daily.  Frequently has small volume stools.  No excessive straining.  Colonoscopy up-to-date  She had recent colonoscopy.  She gets annual mammograms.  Last DEXA scan was 2 years ago.  Immunizations are up-to-date except for flu vaccine.  She has had previous pneumonia vaccines and Shingrix.  Social history-divorced.  She has 2 children and 3 grandchildren and 1 on the way.  Non-smoker.  No regular alcohol use.  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  Mother had atrial fibrillation.  Patient had no siblings.  Past Medical History:  Diagnosis Date   Allergy    Anxiety    Cataract    surgery - Bilateral cataracts removed   Chicken pox    Colon polyps    Depression    Diverticulitis    Diverticulosis    Esophageal stricture    GERD (gastroesophageal reflux disease)    Heart murmur    Hypertension     Past Surgical History:  Procedure Laterality Date   cataract surgery Bilateral 1996   COLONOSCOPY     EYE SURGERY  2000   detached L retina   HERNIA REPAIR  1990   NECK SURGERY     as a child   POLYPECTOMY     UPPER GASTROINTESTINAL ENDOSCOPY      Family History  Problem Relation Age of Onset   Heart disease  Mother 44   COPD Mother    Crohn's disease Mother    Heart disease Father 49   Cancer Father        kidney   Colon cancer Neg Hx    Esophageal cancer Neg Hx    Stomach cancer Neg Hx    Rectal cancer Neg Hx     Social History   Socioeconomic History   Marital status: Divorced    Spouse name: Not on file   Number of children: 2   Years of education: master's degree   Highest education level: Master's degree (e.g., MA, MS, MEng, MEd, MSW, MBA)  Occupational History   Occupation: retired Pharmacist, hospital    Comment: part - time Pharmacist, hospital  Tobacco Use   Smoking status: Never   Smokeless tobacco: Never  Vaping Use   Vaping Use: Never used  Substance and Sexual Activity   Alcohol use: Yes    Alcohol/week: 1.0 standard drink    Types: 1 Glasses of wine per week    Comment: one or two glasses of wine a couple times a week   Drug use: No   Sexual activity: Not on file  Other Topics Concern   Not on file  Social History Narrative      Lives alone in  ranch home.    2 children   Son lives in [place]   Continues to teach virtually part time   Social Determinants of Radio broadcast assistant Strain: Low Risk    Difficulty of Paying Living Expenses: Not hard at all  Food Insecurity: Not on file  Transportation Needs: No Transportation Needs   Lack of Transportation (Medical): No   Lack of Transportation (Non-Medical): No  Physical Activity: Inactive   Days of Exercise per Week: 0 days   Minutes of Exercise per Session: 0 min  Stress: No Stress Concern Present   Feeling of Stress : Not at all  Social Connections: Moderately Integrated   Frequency of Communication with Friends and Family: More than three times a week   Frequency of Social Gatherings with Friends and Family: Twice a week   Attends Religious Services: More than 4 times per year   Active Member of [employer] or Organizations: Yes   Attends Music therapist: More than 4 times per year   Marital Status:  Divorced  Human resources officer Violence: Not At Risk   Fear of Current or Ex-Partner: No   Emotionally Abused: No   Physically Abused: No   Sexually Abused: No    Outpatient Medications Prior to Visit  Medication Sig Dispense Refill   Cholecalciferol (D3 ADULT PO) Take by mouth. daily     lisinopril-hydrochlorothiazide (ZESTORETIC) 20-12.5 MG tablet TAKE ONE TABLET BY MOUTH ONE TIME DAILY 90 tablet 1   Multiple Vitamin (MULTI-VITAMIN) tablet Take by mouth.     omeprazole (PRILOSEC) 40 MG capsule Take 1 capsule (40 mg total) by mouth daily. 90 capsule 3   vitamin C (ASCORBIC ACID) 500 MG tablet Take 500 mg by mouth daily.     VYZULTA 0.024 % SOLN      Polyvinyl Alcohol-Povidone PF (REFRESH) 1.4-0.6 % SOLN Apply to eye.     No facility-administered medications prior to visit.    Allergies  Allergen Reactions   Beeswax     Per skin test from dermatologist   Cetrimonium Chloride [Cetrimide]     Per skin test from dermatologist   Methylisothiazolinone     Per skin test from dermatologist   Neomycin Sulfate [Neomycin]     Per skin test from dermatologist   Nickel Sulfate [Nickel]     Per skin test from dermatologist   Penicillins    Propolis     Per skin test from dermatologist    ROS Review of Systems  Constitutional:  Negative for activity change, appetite change, fatigue, fever and unexpected weight change.  HENT:  Negative for ear pain, hearing loss, sore throat and trouble swallowing.   Eyes:  Negative for visual disturbance.  Respiratory:  Negative for cough and shortness of breath.   Cardiovascular:  Negative for chest pain and palpitations.  Gastrointestinal:  Negative for abdominal pain, blood in stool, constipation and diarrhea.  Genitourinary:  Negative for dysuria and hematuria.  Musculoskeletal:  Negative for arthralgias, back pain and myalgias.  Skin:  Negative for rash.  Neurological:  Negative for dizziness, syncope and headaches.  Hematological:  Negative for  adenopathy.  Psychiatric/Behavioral:  Negative for confusion and dysphoric mood.      Objective:    Physical Exam Constitutional:      Appearance: She is well-developed.  HENT:     Head: Normocephalic and atraumatic.  Eyes:     Pupils: Pupils are equal, round, and reactive to light.  Neck:  Thyroid: No thyromegaly.  Cardiovascular:     Rate and Rhythm: Normal rate and regular rhythm.     Heart sounds: Normal heart sounds. No murmur heard. Pulmonary:     Effort: No respiratory distress.     Breath sounds: Normal breath sounds. No wheezing or rales.  Abdominal:     General: Bowel sounds are normal. There is no distension.     Palpations: Abdomen is soft. There is no mass.     Tenderness: There is no abdominal tenderness. There is no guarding or rebound.  Musculoskeletal:        General: Normal range of motion.     Cervical back: Normal range of motion and neck supple.  Lymphadenopathy:     Cervical: No cervical adenopathy.  Skin:    Findings: No rash.  Neurological:     Mental Status: She is alert and oriented to person, place, and time.     Cranial Nerves: No cranial nerve deficit.     Deep Tendon Reflexes: Reflexes normal.  Psychiatric:        Behavior: Behavior normal.        Thought Content: Thought content normal.        Judgment: Judgment normal.    There were no vitals taken for this visit. Wt Readings from Last 3 Encounters:  08/01/20 122 lb (55.3 kg)  06/16/20 121 lb (54.9 kg)  05/26/20 119 lb (54 kg)     Health Maintenance Due  Topic Date Due   Pneumonia Vaccine 97+ Years old (3) 12/27/2017   COVID-19 Vaccine (3 - Booster for Pfizer series) 08/14/2019   INFLUENZA VACCINE  10/27/2020    There are no preventive care reminders to display for this patient.  Lab Results  Component Value Date   TSH 0.84 02/25/2020   Lab Results  Component Value Date   WBC 6.2 02/25/2020   HGB 13.3 02/25/2020   HCT 39.8 02/25/2020   MCV 93.6 02/25/2020   PLT  320 02/25/2020   Lab Results  Component Value Date   NA 141 02/25/2020   K 3.9 02/25/2020   CO2 29 02/25/2020   GLUCOSE 86 02/25/2020   BUN 18 02/25/2020   CREATININE 0.56 (L) 02/25/2020   BILITOT 0.4 02/25/2020   ALKPHOS 42 02/19/2019   AST 19 02/25/2020   ALT 11 02/25/2020   PROT 6.2 02/25/2020   ALBUMIN 4.2 02/19/2019   CALCIUM 9.6 01/15/2021   GFR 104.44 06/26/2019   Lab Results  Component Value Date   CHOL 224 (H) 02/25/2020   Lab Results  Component Value Date   HDL 95 02/25/2020   Lab Results  Component Value Date   LDLCALC 116 (H) 02/25/2020   Lab Results  Component Value Date   TRIG 50 02/25/2020   Lab Results  Component Value Date   CHOLHDL 2.4 02/25/2020   No results found for: HGBA1C    Assessment & Plan:   Problem List Items Addressed This Visit   None Visit Diagnoses     Physical exam    -  Primary     Generally healthy 75 year old female.  She does have history of osteoporosis.  She has been on Prolia injections for the past couple years.  Set up repeat DEXA scan.  Continue regular weightbearing exercise and daily calcium and vitamin D. Flu vaccine given Continue annual mammogram  Regarding occasional loose stools and leakage we recommend fiber supplement and make sure you are getting at least 25 to 30 g of  fiber per day.  If this does not resolve problem be in touch  No orders of the defined types were placed in this encounter.   Follow-up: No follow-ups on file.    Carolann Littler, MD

## 2021-02-27 NOTE — Patient Instructions (Signed)
Set up repeat DEXA scan  Continue with annual flu vaccine and annual mammogram.

## 2021-03-09 ENCOUNTER — Ambulatory Visit: Payer: Medicare HMO

## 2021-03-16 ENCOUNTER — Ambulatory Visit: Payer: Medicare HMO | Admitting: Podiatry

## 2021-03-16 ENCOUNTER — Ambulatory Visit (INDEPENDENT_AMBULATORY_CARE_PROVIDER_SITE_OTHER): Payer: Medicare HMO

## 2021-03-16 ENCOUNTER — Other Ambulatory Visit: Payer: Self-pay

## 2021-03-16 ENCOUNTER — Encounter: Payer: Self-pay | Admitting: Podiatry

## 2021-03-16 DIAGNOSIS — M722 Plantar fascial fibromatosis: Secondary | ICD-10-CM | POA: Diagnosis not present

## 2021-03-16 MED ORDER — TRIAMCINOLONE ACETONIDE 10 MG/ML IJ SUSP
10.0000 mg | Freq: Once | INTRAMUSCULAR | Status: AC
  Administered 2021-03-16: 17:00:00 10 mg

## 2021-03-16 NOTE — Patient Instructions (Signed)

## 2021-03-18 NOTE — Progress Notes (Signed)
Subjective:   Patient ID: Heidi Shah, female   DOB: 75 y.o.   MRN: [id number]   HPI Patient presents stating the right heel has been very sore for at least a month and makes it hard for him to walk.  States he is tried different treatment options and that she gradually has had a worsening over that period of time   Review of Systems  All other systems reviewed and are negative.      Objective:  Physical Exam Vitals and nursing note reviewed.  Constitutional:      Appearance: She is well-developed.  Pulmonary:     Effort: Pulmonary effort is normal.  Musculoskeletal:        General: Normal range of motion.  Skin:    General: Skin is warm.  Neurological:     Mental Status: She is alert.    Neurovascular status intact muscle strength adequate range of motion within normal limits with exquisite discomfort plantar aspect right heel at the insertional point of the tendon into the calcaneus with inflammation fluid around the medial band.  Patient is found to have good digital perfusion well oriented x3 moderate depression of the arch and mild equinus condition.     Assessment:  Acute Planter fasciitis right inflammation fluid around the medial band     Plan:  H&P reviewed condition educated her at great length on this type of problem and reviewed x-rays.  Today I did sterile prep and injected the plantar fascial right 3 mg dexamethasone Kenalog 5 mg Xylocaine applied fascial brace to lift the arch gave instructions for physical therapy shoe gear modifications and reappoint to recheck  X-rays indicate there is a small spur no indications of stress fracture arthritis associated with condition

## 2021-03-20 ENCOUNTER — Other Ambulatory Visit: Payer: Self-pay | Admitting: Family Medicine

## 2021-04-02 ENCOUNTER — Ambulatory Visit (INDEPENDENT_AMBULATORY_CARE_PROVIDER_SITE_OTHER)
Admission: RE | Admit: 2021-04-02 | Discharge: 2021-04-02 | Disposition: A | Discharge status: Home / Self Care | Payer: Medicare HMO | Source: Ambulatory Visit | Attending: Family Medicine | Admitting: Family Medicine

## 2021-04-02 ENCOUNTER — Other Ambulatory Visit: Payer: Self-pay

## 2021-04-02 DIAGNOSIS — M810 Age-related osteoporosis without current pathological fracture: Secondary | ICD-10-CM | POA: Diagnosis not present

## 2021-04-03 ENCOUNTER — Other Ambulatory Visit: Payer: Self-pay | Admitting: Family Medicine

## 2021-04-03 DIAGNOSIS — Z1231 Encounter for screening mammogram for malignant neoplasm of breast: Secondary | ICD-10-CM

## 2021-04-06 ENCOUNTER — Ambulatory Visit: Payer: Medicare HMO | Admitting: Podiatry

## 2021-04-09 ENCOUNTER — Other Ambulatory Visit: Payer: Self-pay

## 2021-04-09 ENCOUNTER — Ambulatory Visit (INDEPENDENT_AMBULATORY_CARE_PROVIDER_SITE_OTHER): Payer: Medicare HMO | Admitting: Podiatry

## 2021-04-09 DIAGNOSIS — M722 Plantar fascial fibromatosis: Secondary | ICD-10-CM | POA: Diagnosis not present

## 2021-04-10 NOTE — Progress Notes (Signed)
Subjective:   Patient ID: Heidi Shah, female   DOB: 76 y.o.   MRN: [id number]   HPI Patient states that heel has improved quite a bit but still can have some discomfort if she is on it for long periods of time   ROS      Objective:  Physical Exam  Neurovascular status intact significant diminishment of discomfort plantar heel right pain still present but only upon deep palpation     Assessment:  Improvement in plantar fascial inflammation right with previous treatment and home physical therapy     Plan:  H&P reviewed condition great length discussed continued shoe gear modifications continued stretching exercises and ice therapy.  If symptoms were to get worse we will consider other treatments and she will be seen back as needed

## 2021-04-22 ENCOUNTER — Telehealth: Payer: Self-pay | Admitting: Family Medicine

## 2021-04-22 NOTE — Telephone Encounter (Signed)
Left message for patient to call back and schedule Medicare Annual Wellness Visit (AWV) either virtually or in office. Left  my [name] number [phone]   Last AWV 04/24/20  please schedule at anytime with [facility] Nurse Health Advisor 1 or 2   This should be a 45 minute visit.

## 2021-04-27 ENCOUNTER — Encounter: Payer: Self-pay | Admitting: Family Medicine

## 2021-04-30 ENCOUNTER — Ambulatory Visit
Admission: RE | Admit: 2021-04-30 | Discharge: 2021-04-30 | Disposition: A | Discharge status: Home / Self Care | Payer: Medicare HMO | Source: Ambulatory Visit | Attending: Family Medicine | Admitting: Family Medicine

## 2021-04-30 DIAGNOSIS — Z1231 Encounter for screening mammogram for malignant neoplasm of breast: Secondary | ICD-10-CM

## 2021-05-01 ENCOUNTER — Other Ambulatory Visit: Payer: Self-pay | Admitting: Family Medicine

## 2021-05-01 DIAGNOSIS — R928 Other abnormal and inconclusive findings on diagnostic imaging of breast: Secondary | ICD-10-CM

## 2021-05-07 ENCOUNTER — Ambulatory Visit (INDEPENDENT_AMBULATORY_CARE_PROVIDER_SITE_OTHER): Payer: Medicare HMO

## 2021-05-07 VITALS — BP 120/70 | HR 88 | Temp 98.1°F | Ht 59.0 in | Wt 126.1 lb

## 2021-05-07 DIAGNOSIS — Z0000 Encounter for general adult medical examination without abnormal findings: Secondary | ICD-10-CM | POA: Diagnosis not present

## 2021-05-07 NOTE — Progress Notes (Signed)
Subjective:   Heidi Shah is a 76 y.o. female who presents for Medicare Annual (Subsequent) preventive examination.  Review of Systems        Objective:    Today's Vitals   05/07/21 0848  BP: 120/70  Pulse: 88  Temp: 98.1 F (36.7 C)  TempSrc: Oral  Weight: 126 lb 1.6 oz (57.2 kg)  Height: 4\' 11"  (1.499 m)   Body mass index is 25.47 kg/m.  Advanced Directives 05/07/2021 04/24/2020 04/24/2019 04/20/2018 04/17/2018 07/28/2017 03/04/2017  Does Patient Have a Medical Advance Directive? No Yes No No No No No  Type of Advance Directive - Healthcare Power of [place];Living will - - - - -  Does patient want to make changes to medical advance directive? - No - Patient declined - - - - -  Would patient like information on creating a medical advance directive? No - Patient declined - Yes (MAU/Ambulatory/Procedural Areas - Information given) No - Patient declined Yes (MAU/Ambulatory/Procedural Areas - Information given) - -    Current Medications (verified) Outpatient Encounter Medications as of 05/07/2021  Medication Sig   Cholecalciferol (D3 ADULT PO) Take by mouth. daily   lisinopril-hydrochlorothiazide (ZESTORETIC) 20-12.5 MG tablet TAKE ONE TABLET BY MOUTH ONE TIME DAILY   Multiple Vitamin (MULTI-VITAMIN) tablet Take by mouth.   omeprazole (PRILOSEC) 40 MG capsule Take 1 capsule (40 mg total) by mouth daily.   vitamin C (ASCORBIC ACID) 500 MG tablet Take 500 mg by mouth daily.   VYZULTA 0.024 % SOLN    No facility-administered encounter medications on file as of 05/07/2021.    Allergies (verified) Beeswax, Cetrimonium chloride [cetrimide], Methylisothiazolinone, Neomycin sulfate [neomycin], Nickel sulfate [nickel], Penicillins, and Propolis   History: Past Medical History:  Diagnosis Date   Allergy    Anxiety    Cataract    surgery - Bilateral cataracts removed   Chicken pox    Colon polyps    Depression    Diverticulitis    Diverticulosis    Esophageal stricture     GERD (gastroesophageal reflux disease)    Heart murmur    Hypertension    Past Surgical History:  Procedure Laterality Date   cataract surgery Bilateral 1996   COLONOSCOPY     EYE SURGERY  2000   detached L retina   [place]     as a child   POLYPECTOMY     UPPER GASTROINTESTINAL ENDOSCOPY     Family History  Problem Relation Age of Onset   Heart disease Mother 60   COPD Mother    Crohn's disease Mother    Heart disease Father 58   Cancer Father        kidney   Colon cancer Neg Hx    Esophageal cancer Neg Hx    Stomach cancer Neg Hx    Rectal cancer Neg Hx    Social History   Socioeconomic History   Marital status: Divorced    Spouse name: Not on file   Number of children: 2   Years of education: master's degree   Highest education level: Master's degree (e.g., MA, MS, MEng, MEd, MSW, MBA)  Occupational History   Occupation: retired Pharmacist, hospital    Comment: part - time Pharmacist, hospital  Tobacco Use   Smoking status: Never   Smokeless tobacco: Never  Vaping Use   Vaping Use: Never used  Substance and Sexual Activity   Alcohol use: Yes    Alcohol/week: 1.0 standard drink  Types: 1 Glasses of wine per week    Comment: one or two glasses of wine a couple times a week   Drug use: No   Sexual activity: Not on file  Other Topics Concern   Not on file  Social History Narrative      Lives alone in ranch home.    2 children   Son lives in [place]   Continues to teach virtually part time   Social Determinants of Radio broadcast assistant Strain: Low Risk    Difficulty of Paying Living Expenses: Not hard at all  Food Insecurity: Unknown   Worried About Charity fundraiser in the Last Year: Never true   Arboriculturist in the Last Year: Not on file  Transportation Needs: Unknown   Lack of Transportation (Medical): No   Lack of Transportation (Non-Medical): Not on file  Physical Activity: Sufficiently Active   Days of Exercise per Week: 5  days   Minutes of Exercise per Session: 60 min  Stress: No Stress Concern Present   Feeling of Stress : Not at all  Social Connections: Moderately Integrated   Frequency of Communication with Friends and Family: More than three times a week   Frequency of Social Gatherings with Friends and Family: More than three times a week   Attends Religious Services: More than 4 times per year   Active Member of [employer] or Organizations: Yes   Attends Music therapist: More than 4 times per year   Marital Status: Divorced     Clinical Intake:   Diabetic? No  Activities of Daily Living In your present state of health, do you have any difficulty performing the following activities: 05/07/2021  Hearing? N  Vision? N  Difficulty concentrating or making decisions? N  Walking or climbing stairs? N  Dressing or bathing? N  Doing errands, shopping? N  Preparing Food and eating ? N  In the past six months, have you accidently leaked urine? N  Do you have problems with loss of bowel control? N  Managing your Medications? N  Managing your Finances? N  Housekeeping or managing your Housekeeping? N  Some recent data might be hidden    Patient Care Team: Eulas Post, MD as PCP - General (Family Medicine) Kevan Rosebush, MD as Referring Physician (Dermatology) [place], Heather, [place] (Optometry) Irene Shipper, MD as Consulting Physician (Gastroenterology)  Indicate any recent Medical Services you may have received from other than [facility] providers in the past year (date may be approximate).     Assessment:   This is a routine wellness examination for Heidi Shah.  Hearing/Vision screen Hearing Screening - Comments:: No difficulty hearing. Vision Screening - Comments:: Wears glasses. Followed by Dr [place]  Dietary issues and exercise activities discussed: Current Exercise Habits: Home exercise routine, Type of exercise: walking, Time (Minutes): 60, Frequency (Times/Week): 5, Weekly  Exercise (Minutes/Week): 300, Intensity: Moderate, Exercise limited by: None identified   Goals Addressed               This Visit's Progress     Exercise 3x per week (30 min per time) (pt-stated)        Maintain exercise program.       Depression Screen PHQ 2/9 Scores 05/07/2021 02/27/2021 04/24/2020 02/25/2020 04/24/2019 02/19/2019 04/17/2018  PHQ - 2 Score 0 1 0 1 0 0 0  PHQ- 9 Score - - - - - 0 0    Fall Risk Fall  Risk  05/07/2021 02/27/2021 04/24/2020 02/25/2020 04/24/2019  Falls in the past year? 0 1 0 0 0  Number falls in past yr: 0 1 0 - -  Injury with Fall? 0 0 0 - -  Risk for fall due to : No Fall Risks - No Fall Risks - Medication side effect  Follow up - - Falls evaluation completed;Falls prevention discussed - -    FALL RISK PREVENTION PERTAINING TO THE HOME:  Any stairs in or around the home? Yes  If so, are there any without handrails? No  Home free of loose throw rugs in walkways, pet beds, electrical cords, etc? Yes  Adequate lighting in your home to reduce risk of falls? Yes   ASSISTIVE DEVICES UTILIZED TO PREVENT FALLS:  Life alert? No  Use of a cane, walker or w/c? No  Grab bars in the bathroom? No  Shower chair or bench in shower? No  Elevated toilet seat or a handicapped toilet? No   TIMED UP AND GO:  Was the test performed? Yes .  Length of time to ambulate 10 feet: 5 sec.   Gait steady and fast without use of assistive device  Cognitive Function: MMSE - Mini Mental State Exam 03/04/2017  Not completed: (No Data)     6CIT Screen 05/07/2021 04/24/2019  What Year? 0 points 0 points  What month? 0 points 0 points  What time? 0 points 0 points  Count back from 20 0 points 0 points  Months in reverse 0 points 0 points  Repeat phrase 0 points 0 points  Total Score 0 0    Immunizations Immunization History  Administered Date(s) Administered   Fluad Quad(high Dose 65+) 01/05/2019, 02/25/2020   Influenza, High Dose Seasonal PF 02/03/2015,  02/13/2016, 01/09/2017, 01/02/2018   Influenza,inj,Quad PF,6+ Mos 12/27/2012, 12/26/2013, 02/27/2021   Influenza-Unspecified 01/09/2017, 01/05/2019   PFIZER(Purple Top)SARS-COV-2 Vaccination 05/19/2019, 06/19/2019, 03/25/2020   Pneumococcal Conjugate-13 01/01/2014   Pneumococcal Polysaccharide-23 12/27/2012   Td 08/27/2005   Tdap 02/26/2016   Zoster Recombinat (Shingrix) 02/20/2019, 10/01/2019    TDAP status: Up to date  Flu Vaccine status: Up to date  Pneumococcal vaccine status: Up to date  Covid-19 vaccine status: Completed vaccines  Qualifies for Shingles Vaccine? Yes   Zostavax completed Yes   Shingrix Completed?: Yes  Screening Tests Health Maintenance  Topic Date Due   COVID-19 Vaccine (4 - Booster for Pfizer series) 05/20/2020   Hepatitis C Screening  02/12/2026 (Originally 02/06/1964)   Pneumonia Vaccine 70+ Years old (8) 03/10/2039 (Originally 12/27/2017)   MAMMOGRAM  05/01/2023   COLONOSCOPY (Pts 45-56yrs Insurance coverage will need to be confirmed)  06/16/2025   TETANUS/TDAP  02/25/2026   INFLUENZA VACCINE  Completed   DEXA SCAN  Completed   Zoster Vaccines- Shingrix  Completed   HPV VACCINES  Aged Out    Health Maintenance  Health Maintenance Due  Topic Date Due   COVID-19 Vaccine (4 - Booster for [place] series) 05/20/2020    Colorectal cancer screening: Type of screening: Colonoscopy. Completed 05/27/20. Repeat every 5 years  Mammogram status: Completed 04/30/21. Repeat every year 2   Bone Density status: Completed 04/02/21. Results reflect: Bone density results: OSTEOPOROSIS. Repeat every 2 years.    Additional Screening:  Hepatitis C Screening: does not qualify; Completed   Vision Screening: Recommended annual ophthalmology exams for early detection of glaucoma and other disorders of the eye. Is the patient up to date with their annual eye exam?  Yes  Who is the  provider or what is the name of the office in which the patient attends annual eye  exams? Dr [place] If pt is not established with a provider, would they like to be referred to a provider to establish care? No .   Dental Screening: Recommended annual dental exams for proper oral hygiene  Community Resource Referral / Chronic Care Management:  CRR required this visit?  No   CCM required this visit?  No      Plan:     I have personally reviewed and noted the following in the patients chart:   Medical and social history Use of alcohol, tobacco or illicit drugs  Current medications and supplements including opioid prescriptions.  Functional ability and status Nutritional status Physical activity Advanced directives List of other physicians Hospitalizations, surgeries, and ER visits in previous 12 months Vitals Screenings to include cognitive, depression, and falls Referrals and appointments  In addition, I have reviewed and discussed with patient certain preventive protocols, quality metrics, and best practice recommendations. A written personalized care plan for preventive services as well as general preventive health recommendations were provided to patient.     Criselda Peaches, LPN   [id number]   Nurse Notes: Patient request f/u visit with IBS concerns. Appt  request pending schedule.

## 2021-05-07 NOTE — Patient Instructions (Addendum)
Heidi Shah , Thank you for taking time to come for your Medicare Wellness Visit. I appreciate your ongoing commitment to your health goals. Please review the following plan we discussed and let me know if I can assist you in the future.   These are the goals we discussed:  Goals       avoid covid      Exercise 3x per week (30 min per time) (pt-stated)      Maintain exercise program.      Patient Stated      Eat calcium rich foods for osteoporosis treatment since you are unable to tolerate calcium supplements        This is a list of the screening recommended for you and due dates:  Health Maintenance  Topic Date Due   COVID-19 Vaccine (4 - Booster for Pfizer series) 05/20/2020   Hepatitis C Screening: USPSTF Recommendation to screen - Ages 18-79 yo.  02/12/2026*   Pneumonia Vaccine (3) 03/10/2039*   Mammogram  05/01/2023   Colon Cancer Screening  06/16/2025   Tetanus Vaccine  02/25/2026   Flu Shot  Completed   DEXA scan (bone density measurement)  Completed   Zoster (Shingles) Vaccine  Completed   HPV Vaccine  Aged Out  *Topic was postponed. The date shown is not the original due date.    Advanced directives: No Patient deferred  Conditions/risks identified: None  Next appointment: Follow up in one year for your annual wellness visit    Preventive Care 65 Years and Older, Female Preventive care refers to lifestyle choices and visits with your health care provider that can promote health and wellness. What does preventive care include? A yearly physical exam. This is also called an annual well check. Dental exams once or twice a year. Routine eye exams. Ask your health care provider how often you should have your eyes checked. Personal lifestyle choices, including: Daily care of your teeth and gums. Regular physical activity. Eating a healthy diet. Avoiding tobacco and drug use. Limiting alcohol use. Practicing safe sex. Taking low-dose aspirin every  day. Taking vitamin and mineral supplements as recommended by your health care provider. What happens during an annual well check? The services and screenings done by your health care provider during your annual well check will depend on your age, overall health, lifestyle risk factors, and family history of disease. Counseling  Your health care provider may ask you questions about your: Alcohol use. Tobacco use. Drug use. Emotional well-being. Home and relationship well-being. Sexual activity. Eating habits. History of falls. Memory and ability to understand (cognition). Work and work Statistician. Reproductive health. Screening  You may have the following tests or measurements: Height, weight, and BMI. Blood pressure. Lipid and cholesterol levels. These may be checked every 5 years, or more frequently if you are over 28 years old. Skin check. Lung cancer screening. You may have this screening every year starting at age 49 if you have a 30-pack-year history of smoking and currently smoke or have quit within the past 15 years. Fecal occult blood test (FOBT) of the stool. You may have this test every year starting at age 59. Flexible sigmoidoscopy or colonoscopy. You may have a sigmoidoscopy every 5 years or a colonoscopy every 10 years starting at age 25. Hepatitis C blood test. Hepatitis B blood test. Sexually transmitted disease (STD) testing. Diabetes screening. This is done by checking your blood sugar (glucose) after you have not eaten for a while (fasting). You may have this  done every 1-3 years. Bone density scan. This is done to screen for osteoporosis. You may have this done starting at age 67. Mammogram. This may be done every 1-2 years. Talk to your health care provider about how often you should have regular mammograms. Talk with your health care provider about your test results, treatment options, and if necessary, the need for more tests. Vaccines  Your health care  provider may recommend certain vaccines, such as: Influenza vaccine. This is recommended every year. Tetanus, diphtheria, and acellular pertussis (Tdap, Td) vaccine. You may need a Td booster every 10 years. Zoster vaccine. You may need this after age 34. Pneumococcal 13-valent conjugate (PCV13) vaccine. One dose is recommended after age 16. Pneumococcal polysaccharide (PPSV23) vaccine. One dose is recommended after age 68. Talk to your health care provider about which screenings and vaccines you need and how often you need them. This information is not intended to replace advice given to you by your health care provider. Make sure you discuss any questions you have with your health care provider. Document Released: 04/11/2015 Document Revised: 12/03/2015 Document Reviewed: 01/14/2015 Elsevier Interactive Patient Education  2017 [place] Prevention in the Home Falls can cause injuries. They can happen to people of all ages. There are many things you can do to make your home safe and to help prevent falls. What can I do on the outside of my home? Regularly fix the edges of walkways and driveways and fix any cracks. Remove anything that might make you trip as you walk through a door, such as a raised step or threshold. Trim any bushes or trees on the path to your home. Use bright outdoor lighting. Clear any walking paths of anything that might make someone trip, such as rocks or tools. Regularly check to see if handrails are loose or broken. Make sure that both sides of any steps have handrails. Any raised decks and porches should have guardrails on the edges. Have any leaves, snow, or ice cleared regularly. Use sand or salt on walking paths during winter. Clean up any spills in your garage right away. This includes oil or grease spills. What can I do in the bathroom? Use night lights. Install grab bars by the toilet and in the tub and shower. Do not use towel bars as grab  bars. Use non-skid mats or decals in the tub or shower. If you need to sit down in the shower, use a plastic, non-slip stool. Keep the floor dry. Clean up any water that spills on the floor as soon as it happens. Remove soap buildup in the tub or shower regularly. Attach bath mats securely with double-sided non-slip rug tape. Do not have throw rugs and other things on the floor that can make you trip. What can I do in the bedroom? Use night lights. Make sure that you have a light by your bed that is easy to reach. Do not use any sheets or blankets that are too big for your bed. They should not hang down onto the floor. Have a firm chair that has side arms. You can use this for support while you get dressed. Do not have throw rugs and other things on the floor that can make you trip. What can I do in the kitchen? Clean up any spills right away. Avoid walking on wet floors. Keep items that you use a lot in easy-to-reach places. If you need to reach something above you, use a strong step stool that  has a grab bar. Keep electrical cords out of the way. Do not use floor polish or wax that makes floors slippery. If you must use wax, use non-skid floor wax. Do not have throw rugs and other things on the floor that can make you trip. What can I do with my stairs? Do not leave any items on the stairs. Make sure that there are handrails on both sides of the stairs and use them. Fix handrails that are broken or loose. Make sure that handrails are as long as the stairways. Check any carpeting to make sure that it is firmly attached to the stairs. Fix any carpet that is loose or worn. Avoid having throw rugs at the top or bottom of the stairs. If you do have throw rugs, attach them to the floor with carpet tape. Make sure that you have a light switch at the top of the stairs and the bottom of the stairs. If you do not have them, ask someone to add them for you. What else can I do to help prevent  falls? Wear shoes that: Do not have high heels. Have rubber bottoms. Are comfortable and fit you well. Are closed at the toe. Do not wear sandals. If you use a stepladder: Make sure that it is fully opened. Do not climb a closed stepladder. Make sure that both sides of the stepladder are locked into place. Ask someone to hold it for you, if possible. Clearly mark and make sure that you can see: Any grab bars or handrails. First and last steps. Where the edge of each step is. Use tools that help you move around (mobility aids) if they are needed. These include: Canes. Walkers. Scooters. Crutches. Turn on the lights when you go into a dark area. Replace any light bulbs as soon as they burn out. Set up your furniture so you have a clear path. Avoid moving your furniture around. If any of your floors are uneven, fix them. If there are any pets around you, be aware of where they are. Review your medicines with your doctor. Some medicines can make you feel dizzy. This can increase your chance of falling. Ask your doctor what other things that you can do to help prevent falls. This information is not intended to replace advice given to you by your health care provider. Make sure you discuss any questions you have with your health care provider. Document Released: 01/09/2009 Document Revised: 08/21/2015 Document Reviewed: 04/19/2014 Elsevier Interactive Patient Education  2017 [employer].

## 2021-05-11 DIAGNOSIS — H401131 Primary open-angle glaucoma, bilateral, mild stage: Secondary | ICD-10-CM | POA: Diagnosis not present

## 2021-05-15 ENCOUNTER — Ambulatory Visit: Payer: Medicare HMO | Admitting: Family Medicine

## 2021-05-19 ENCOUNTER — Other Ambulatory Visit: Payer: Self-pay

## 2021-05-19 ENCOUNTER — Ambulatory Visit: Payer: Medicare HMO

## 2021-05-19 ENCOUNTER — Ambulatory Visit
Admission: RE | Admit: 2021-05-19 | Discharge: 2021-05-19 | Disposition: A | Discharge status: Home / Self Care | Payer: Medicare HMO | Source: Ambulatory Visit | Attending: Family Medicine | Admitting: Family Medicine

## 2021-05-19 DIAGNOSIS — R922 Inconclusive mammogram: Secondary | ICD-10-CM | POA: Diagnosis not present

## 2021-05-19 DIAGNOSIS — R928 Other abnormal and inconclusive findings on diagnostic imaging of breast: Secondary | ICD-10-CM

## 2021-05-20 ENCOUNTER — Ambulatory Visit (INDEPENDENT_AMBULATORY_CARE_PROVIDER_SITE_OTHER): Payer: Medicare HMO | Admitting: Family Medicine

## 2021-05-20 ENCOUNTER — Encounter: Payer: Self-pay | Admitting: Family Medicine

## 2021-05-20 VITALS — BP 124/84 | HR 82 | Temp 98.3°F | Wt 127.4 lb

## 2021-05-20 DIAGNOSIS — R140 Abdominal distension (gaseous): Secondary | ICD-10-CM

## 2021-05-20 DIAGNOSIS — M810 Age-related osteoporosis without current pathological fracture: Secondary | ICD-10-CM | POA: Diagnosis not present

## 2021-05-20 DIAGNOSIS — K5900 Constipation, unspecified: Secondary | ICD-10-CM | POA: Diagnosis not present

## 2021-05-20 NOTE — Patient Instructions (Signed)
Consider Miralax as needed for constipation.   Algae-cal supplement   I will check with our pharmacist regarding Prolia assistance.

## 2021-05-20 NOTE — Progress Notes (Signed)
Established Patient Office Visit  Subjective:  Patient ID: Heidi Shah, female    DOB: 03/06/1946  Age: 76 y.o. MRN: [id number]  CC:  Chief Complaint  Patient presents with   Follow-up    HPI Heidi Shah presents to discuss multiple issues as follows  She recently had Medicare wellness visit.  She had several questions that she wanted discuss further coming out of that.  She has history of chronic IBS symptoms.   Has seen GI multiple times in the past.  Frequently feels bloated and "gassy ".  She has tried things like probiotics without relief.  She is on Prilosec regularly.  She frequently goes several days sometimes up to a week without a bowel movement.  Has not tried MiraLAX.  She does sometimes go from one extreme of feeling bloated and constipated to sometimes frequent stools.  She has had this pattern for years.  Other issue is osteoporosis history.  She was very reluctant to consider bisphosphonates.  She did go on Prolia and did 2 injections total.  She had increased achiness after the second injection but not after the first.  She is willing to consider going back on Prolia at this time.  She continues to take vitamin D and calcium.  One of her major impediments is cost with Prolia and she would like to explore options there to see if there is any help available.  Recent DEXA scan from January of this year reviewed.  T score -3.6 distal left radius.  -2.1 left femur  Past Medical History:  Diagnosis Date   Allergy    Anxiety    Cataract    surgery - Bilateral cataracts removed   Chicken pox    Colon polyps    Depression    Diverticulitis    Diverticulosis    Esophageal stricture    GERD (gastroesophageal reflux disease)    Heart murmur    Hypertension     Past Surgical History:  Procedure Laterality Date   cataract surgery Bilateral 1996   COLONOSCOPY     EYE SURGERY  2000   detached L retina   HERNIA REPAIR  1990   NECK SURGERY     as a child    POLYPECTOMY     UPPER GASTROINTESTINAL ENDOSCOPY      Family History  Problem Relation Age of Onset   Heart disease Mother 52   COPD Mother    Crohn's disease Mother    Heart disease Father 6   Cancer Father        kidney   Colon cancer Neg Hx    Esophageal cancer Neg Hx    Stomach cancer Neg Hx    Rectal cancer Neg Hx     Social History   Socioeconomic History   Marital status: Divorced    Spouse name: Not on file   Number of children: 2   Years of education: master's degree   Highest education level: Master's degree (e.g., MA, MS, MEng, MEd, MSW, MBA)  Occupational History   Occupation: retired Pharmacist, hospital    Comment: part - time Pharmacist, hospital  Tobacco Use   Smoking status: Never   Smokeless tobacco: Never  Vaping Use   Vaping Use: Never used  Substance and Sexual Activity   Alcohol use: Yes    Alcohol/week: 1.0 standard drink    Types: 1 Glasses of wine per week    Comment: one or two glasses of wine a couple times a week  Drug use: No   Sexual activity: Not on file  Other Topics Concern   Not on file  Social History Narrative      Lives alone in ranch home.    2 children   Son lives in [place]   Continues to teach virtually part time   Social Determinants of Radio broadcast assistant Strain: Low Risk    Difficulty of Paying Living Expenses: Not hard at all  Food Insecurity: No Food Insecurity   Worried About Charity fundraiser in the Last Year: Never true   Arboriculturist in the Last Year: Never true  Transportation Needs: No Transportation Needs   Lack of Transportation (Medical): No   Lack of Transportation (Non-Medical): No  Physical Activity: Sufficiently Active   Days of Exercise per Week: 3 days   Minutes of Exercise per Session: 60 min  Stress: No Stress Concern Present   Feeling of Stress : Only a little  Social Connections: Moderately Isolated   Frequency of Communication with Friends and Family: Once a week   Frequency of Social  Gatherings with Friends and Family: Once a week   Attends Religious Services: More than 4 times per year   Active Member of [employer] or Organizations: Yes   Attends Music therapist: More than 4 times per year   Marital Status: Divorced  Human resources officer Violence: Not At Risk   Fear of Current or Ex-Partner: No   Emotionally Abused: No   Physically Abused: No   Sexually Abused: No    Outpatient Medications Prior to Visit  Medication Sig Dispense Refill   Cholecalciferol (D3 ADULT PO) Take by mouth. daily     lisinopril-hydrochlorothiazide (ZESTORETIC) 20-12.5 MG tablet TAKE ONE TABLET BY MOUTH ONE TIME DAILY 90 tablet 1   Multiple Vitamin (MULTI-VITAMIN) tablet Take by mouth.     omeprazole (PRILOSEC) 40 MG capsule Take 1 capsule (40 mg total) by mouth daily. 90 capsule 3   vitamin C (ASCORBIC ACID) 500 MG tablet Take 500 mg by mouth daily.     VYZULTA 0.024 % SOLN      No facility-administered medications prior to visit.    Allergies  Allergen Reactions   Beeswax     Per skin test from dermatologist   Cetrimonium Chloride [Cetrimide]     Per skin test from dermatologist   Methylisothiazolinone     Per skin test from dermatologist   Neomycin Sulfate [Neomycin]     Per skin test from dermatologist   Nickel Sulfate [Nickel]     Per skin test from dermatologist   Penicillins    Propolis     Per skin test from dermatologist    ROS Review of Systems  Constitutional:  Negative for fatigue.  Eyes:  Negative for visual disturbance.  Respiratory:  Negative for cough, chest tightness, shortness of breath and wheezing.   Cardiovascular:  Negative for chest pain, palpitations and leg swelling.  Gastrointestinal:  Positive for constipation. Negative for nausea and vomiting.       See HPI  Neurological:  Negative for dizziness, seizures, syncope, weakness, light-headedness and headaches.     Objective:    Physical Exam Vitals reviewed.  Constitutional:       General: She is not in acute distress.    Appearance: She is well-developed and normal weight. She is not ill-appearing or toxic-appearing.  HENT:     Head: Normocephalic and atraumatic.  Neck:     Thyroid: No  thyromegaly.  Cardiovascular:     Rate and Rhythm: Normal rate and regular rhythm.     Heart sounds: Normal heart sounds. No murmur heard. Pulmonary:     Effort: No respiratory distress.     Breath sounds: Normal breath sounds. No wheezing or rales.  Musculoskeletal:     Cervical back: Normal range of motion and neck supple.  Lymphadenopathy:     Cervical: No cervical adenopathy.  Neurological:     Mental Status: She is alert and oriented to person, place, and time.    BP 124/84 (BP Location: Right Arm, Patient Position: Sitting, Cuff Size: Normal)    Pulse 82    Temp 98.3 F (36.8 C) (Oral)    Wt 127 lb 6.4 oz (57.8 kg)    SpO2 98%    BMI 25.73 kg/m  Wt Readings from Last 3 Encounters:  05/20/21 127 lb 6.4 oz (57.8 kg)  05/07/21 126 lb 1.6 oz (57.2 kg)  02/27/21 125 lb 12.8 oz (57.1 kg)     There are no preventive care reminders to display for this patient.  There are no preventive care reminders to display for this patient.  Lab Results  Component Value Date   TSH 1.11 02/27/2021   Lab Results  Component Value Date   WBC 4.4 02/27/2021   HGB 13.7 02/27/2021   HCT 40.9 02/27/2021   MCV 93.5 02/27/2021   PLT 284.0 02/27/2021   Lab Results  Component Value Date   NA 137 02/27/2021   K 3.8 02/27/2021   CO2 30 02/27/2021   GLUCOSE 90 02/27/2021   BUN 14 02/27/2021   CREATININE 0.64 02/27/2021   BILITOT 0.5 02/27/2021   ALKPHOS 39 02/27/2021   AST 20 02/27/2021   ALT 13 02/27/2021   PROT 6.8 02/27/2021   ALBUMIN 4.4 02/27/2021   CALCIUM 9.5 02/27/2021   GFR 87.90 02/27/2021   Lab Results  Component Value Date   CHOL 259 (H) 02/27/2021   Lab Results  Component Value Date   HDL 86.30 02/27/2021   Lab Results  Component Value Date   LDLCALC 157  (H) 02/27/2021   Lab Results  Component Value Date   TRIG 77.0 02/27/2021   Lab Results  Component Value Date   CHOLHDL 3 02/27/2021   No results found for: HGBA1C    Assessment & Plan:   #1 history of IBS.  She has predominantly dealing with constipation issues.  Frequent bloated gas symptoms.  Has tried probiotics without relief.  -We gave handout on low FODMAPs dietary eating plan. -Consider as needed use of MiraLAX when she is having several days of constipation. -Continue to stay well-hydrated and stay active physically  #2 osteoporosis.  Patient is willing to go back on Prolia and give this another try.  She has some achiness and not sure whether this was related to the Prolia or not.  Her major impediment is [place].  We will check to see if there are any programs to try to help assist her in this regard. Her last DEXA scan in January confirmed 2.9% further loss in bone mass compared with previous.  No orders of the defined types were placed in this encounter.   Follow-up: No follow-ups on file.    Carolann Littler, MD

## 2021-06-18 ENCOUNTER — Other Ambulatory Visit: Payer: Self-pay | Admitting: Internal Medicine

## 2021-06-18 DIAGNOSIS — K259 Gastric ulcer, unspecified as acute or chronic, without hemorrhage or perforation: Secondary | ICD-10-CM

## 2021-06-25 ENCOUNTER — Ambulatory Visit (INDEPENDENT_AMBULATORY_CARE_PROVIDER_SITE_OTHER): Payer: Medicare HMO | Admitting: Family Medicine

## 2021-06-25 ENCOUNTER — Encounter: Payer: Self-pay | Admitting: Family Medicine

## 2021-06-25 VITALS — BP 123/76 | HR 78 | Temp 98.9°F | Wt 125.0 lb

## 2021-06-25 DIAGNOSIS — M25512 Pain in left shoulder: Secondary | ICD-10-CM

## 2021-06-25 DIAGNOSIS — M7522 Bicipital tendinitis, left shoulder: Secondary | ICD-10-CM

## 2021-06-25 MED ORDER — PREDNISONE 10 MG PO TABS: ORAL_TABLET | ORAL | 0 refills | Status: DC

## 2021-06-25 NOTE — Progress Notes (Signed)
Subjective:  ? ? Patient ID: Heidi Shah, female    DOB: 08/09/1945, 76 y.o.   MRN: [id number] ? ?Chief Complaint  ?Patient presents with  ? Shoulder Pain  ?  Left shoulder pain, moved too quickly yday and popped, now is more painful than it has been. Been having pain on and off for a little while. Has taken ibuprofen  ? ? ?HPI ?Patient was seen today for acute concern.  Pt with increased intermittent L shoulder pain x one day after turning around when someone called her name.  Pt was not holding anything at the time of injury.  Pt heard a loud pop when she turned.  Pain 8-9/10 with raising LUE to 85 degrees or greater.  Took Excedrin at time of injury and Aleve.  Notes occasional going down LUE when raised.  Denies edema, neck pain, decreased grip.  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  Typically has improvement in chronic pain when she does not carry her bag. ? ?Past Medical History:  ?Diagnosis Date  ? Allergy   ? Anxiety   ? Cataract   ? surgery - Bilateral cataracts removed  ? Chicken pox   ? Colon polyps   ? Depression   ? Diverticulitis   ? Diverticulosis   ? Esophageal stricture   ? GERD (gastroesophageal reflux disease)   ? Heart murmur   ? Hypertension   ? ? ?Allergies  ?Allergen Reactions  ? Beeswax   ?  Per skin test from dermatologist  ? Cetrimonium Chloride [Cetrimide]   ?  Per skin test from dermatologist  ? Methylisothiazolinone   ?  Per skin test from dermatologist  ? Neomycin Sulfate [Neomycin]   ?  Per skin test from dermatologist  ? Nickel Sulfate [Nickel]   ?  Per skin test from dermatologist  ? Penicillins   ? Propolis   ?  Per skin test from dermatologist  ? ? ?ROS ?General: Denies fever, chills, night sweats, changes in weight, changes in appetite ?HEENT: Denies headaches, ear pain, changes in vision, rhinorrhea, sore throat ?CV: Denies CP, palpitations, SOB, orthopnea ?Pulm: Denies SOB, cough, wheezing ?GI: Denies abdominal pain, nausea,  vomiting, diarrhea, constipation ?GU: Denies dysuria, hematuria, frequency, vaginal discharge ?Msk: Denies muscle cramps, joint pains + L shoulder pain ?Neuro: Denies weakness, numbness, tingling ?Skin: Denies rashes, bruising ?Psych: Denies depression, anxiety, hallucinations ? ?  ?Objective:  ?  ?Blood pressure 123/76, pulse 78, temperature 98.9 ?F (37.2 ?C), temperature source Oral, weight 125 lb (56.7 kg), SpO2 97 %. ? ?Gen. Pleasant, well-nourished, in no distress, normal affect   ?HEENT: /AT, face symmetric, conjunctiva clear, no scleral icterus, PERRLA, EOMI, nares patent without drainage ?Lungs: no accessory muscle use ?Cardiovascular: RRR, no peripheral edema ?Musculoskeletal: b/l shoulders symmetric without edema or erythema.  No TTP of cervical spine, posterior left shoulder.  TTP midline upper thoracic spine anterior left shoulder lateral to the biceps brachii long head tendon.  No TTP of the acromion.  Increased muscle tension left lateral neck.  Decreased active and passive ROM.  Negative cross body.  No deformities, no cyanosis or clubbing, normal tone ?Neuro:  A&Ox3, CN II-XII intact, normal gait ?Skin:  Warm, no lesions/ rash ? ?Wt Readings from Last 3 Encounters:  ?06/25/21 125 lb (56.7 kg)  ?05/20/21 127 lb 6.4 oz (57.8 kg)  ?05/07/21 126 lb 1.6 oz (57.2 kg)  ? ? ?Lab Results  ?Component Value Date  ? WBC 4.4  02/27/2021  ? HGB 13.7 02/27/2021  ? HCT 40.9 02/27/2021  ? PLT 284.0 02/27/2021  ? GLUCOSE 90 02/27/2021  ? CHOL 259 (H) 02/27/2021  ? TRIG 77.0 02/27/2021  ? HDL 86.30 02/27/2021  ? LDLDIRECT 162.6 12/20/2012  ? LDLCALC 157 (H) 02/27/2021  ? ALT 13 02/27/2021  ? AST 20 02/27/2021  ? NA 137 02/27/2021  ? K 3.8 02/27/2021  ? CL 99 02/27/2021  ? CREATININE 0.64 02/27/2021  ? BUN 14 02/27/2021  ? CO2 30 02/27/2021  ? TSH 1.11 02/27/2021  ? ? ?Assessment/Plan: ? ?Acute pain of left shoulder  ?- Plan: predniSONE (DELTASONE) 10 MG tablet ? ?Tendonitis of upper biceps tendon of left shoulder -  Plan: predniSONE (DELTASONE) 10 MG tablet ? ?Discussed possible causes of acute pain including tendinitis versus strain.  Discussed supportive care including ice, heat, topical analgesics, NSAIDs or Tylenol as needed, gentle range of motion exercises.  Patient has Aleve at home.  Rx for prednisone taper sent to pharmacy.  Patient will wait to pick up Rx for prednisone as it occasionally causes her to be short tempered.  For continued or worsening symptoms discussed referral to Ortho vs sports medicine and imaging. ? ?F/u prn ? ?Grier Mitts, MD ?

## 2021-06-26 ENCOUNTER — Ambulatory Visit: Payer: Medicare HMO | Admitting: Family Medicine

## 2021-07-22 ENCOUNTER — Encounter: Payer: Self-pay | Admitting: Family Medicine

## 2021-07-22 ENCOUNTER — Ambulatory Visit (INDEPENDENT_AMBULATORY_CARE_PROVIDER_SITE_OTHER): Payer: Medicare HMO | Admitting: Family Medicine

## 2021-07-22 VITALS — BP 120/80 | HR 87 | Temp 98.1°F | Ht 59.0 in | Wt 124.6 lb

## 2021-07-22 DIAGNOSIS — M25512 Pain in left shoulder: Secondary | ICD-10-CM | POA: Diagnosis not present

## 2021-07-22 NOTE — Progress Notes (Signed)
? ?Established Patient Office Visit ? ?Subjective   ?Patient ID: Heidi Shah, female    DOB: [birth year]  Age: 76 y.o. MRN: [id number] ? ?Chief Complaint  ?Patient presents with  ? Shoulder Pain  ?  Patient complains of left shoulder pain,   ? ? ?HPI ? ? ? ?Patient seen for ongoing left shoulder pain.  Refer to prior note for details.  She was seen here back in March for acute shoulder pain which started the day before that visit.  She was turning around suddenly and felt a "pop" sensation in her shoulder.  She had pain predominantly with abduction and to some degree internal rotation since then.  She took some prednisone couple weeks ago without much improvement also tried ibuprofen.  No neck pain.  No radiculitis symptoms.  She thinks that her range of motion is limited more by pain than weakness at this time.  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  Some night pain.  No prior history of major shoulder problems ? ?Past Medical History:  ?Diagnosis Date  ? Allergy   ? Anxiety   ? Cataract   ? surgery - Bilateral cataracts removed  ? Chicken pox   ? Colon polyps   ? Depression   ? Diverticulitis   ? Diverticulosis   ? Esophageal stricture   ? GERD (gastroesophageal reflux disease)   ? Heart murmur   ? Hypertension   ? ?Past Surgical History:  ?Procedure Laterality Date  ? cataract surgery Bilateral 1996  ? COLONOSCOPY    ? EYE SURGERY  2000  ? detached L retina  ? HERNIA REPAIR  1990  ? NECK SURGERY    ? as a child  ? POLYPECTOMY    ? UPPER GASTROINTESTINAL ENDOSCOPY    ? ? reports that she has never smoked. She has never used smokeless tobacco. She reports current alcohol use of about 1.0 standard drink per week. She reports that she does not use drugs. ?family history includes COPD in her mother; Cancer in her father; Crohn's disease in her mother; Heart disease (age of onset: 63) in her mother; Heart disease (age of onset: 9) in her father. ?Allergies   ?Allergen Reactions  ? Beeswax   ?  Per skin test from dermatologist  ? Cetrimonium Chloride [Cetrimide]   ?  Per skin test from dermatologist  ? Methylisothiazolinone   ?  Per skin test from dermatologist  ? Neomycin Sulfate [Neomycin]   ?  Per skin test from dermatologist  ? Nickel Sulfate [Nickel]   ?  Per skin test from dermatologist  ? Penicillins   ? Propolis   ?  Per skin test from dermatologist  ? ? ?Review of Systems  ?Musculoskeletal:  Negative for back pain and neck pain.  ? ?  ?Objective:  ?  ? ?BP 120/80 (BP Location: Left Arm, Patient Position: Sitting, Cuff Size: Normal)   Pulse 87   Temp 98.1 ?F (36.7 ?C) (Oral)   Ht '4\' 11"'$  (1.499 m)   Wt 124 lb 9.6 oz (56.5 kg)   SpO2 97%   BMI 25.17 kg/m?  ? ? ?Physical Exam ?Vitals reviewed.  ?Cardiovascular:  ?   Rate and Rhythm: Normal rate.  ?Pulmonary:  ?   Effort: Pulmonary effort is normal.  ?   Breath sounds: Normal breath sounds.  ?Musculoskeletal:  ?   Comments: Left shoulder reveals no localized tenderness.  No significant biceps tenderness.  She does have significant pain with abduction greater than about 70 degrees against resistance.  Mild pain with internal rotation.  No acromioclavicular tenderness.  ?Neurological:  ?   Mental Status: She is alert.  ? ? ? ?No results found for any visits on 07/22/21. ? ? ? ?The 10-year ASCVD risk score (Arnett DK, et al., 2019) is: 15.5% ? ?  ?Assessment & Plan:  ? ?Problem List Items Addressed This Visit   ?None ?Visit Diagnoses   ? ? Acute pain of left shoulder    -  Primary  ? Relevant Orders  ? Ambulatory referral to Sports Medicine  ? ?  ?Patient presents with 1 month history of left shoulder pain after sudden movement.  Pain predominantly with abduction.  Suspect rotator cuff tendinitis.  Cannot rule out partial tear. ? ?-Set up sports medicine referral ?-We did express our concerns regarding her being at risk for adhesive capsulitis.  Try to maintain range of motion ? ?No follow-ups on file.   ? ? ?Carolann Littler, MD ? ?

## 2021-07-29 NOTE — Progress Notes (Signed)
? ? ?Subjective:   ? ?CC: L shoulder pain ? ?I, Wendy Poet, LAT, ATC, am serving as scribe for Dr. Lynne Leader. ? ?HPI: Pt is a 76 y/o female presenting w/ c/o acute on chronic L shoulder pain that flared in late March 2023 when her L shoulder popped while quickly turning around to address someone who called her name.  She has been seen x 2 by primary care for these c/o.  She locates her pain to her L ant shoulder. ? ?Radiating pain: yes into her L upper arm ?L shoulder mechanical symptoms: no except for the initial pop at time of injury in March ?Aggravating factors: L shoulder aBd past approximately 75-80 deg; L shoulder functional ER and IR ?Treatments tried: prednisone; IBU ? ?Pertinent review of Systems: No fevers or chills ? ?Relevant historical information: Hypertension ? ? ?Objective:   ? ?Vitals:  ? 07/30/21 0910  ?BP: 120/80  ?Pulse: 89  ?SpO2: 95%  ? ?General: Well Developed, well nourished, and in no acute distress.  ? ?MSK: Left shoulder: Normal-appearing ?Mildly tender palpation superior shoulder. ?Range of motion abduction 120 degrees functional internal rotation lumbar spine external rotation full. ?Strength abduction 4/5 external and internal rotation are intact. ?Positive Hawkins and Neer's test. ?Negative Yergason's and speeds test. ?Pulses and capillary refill and sensation are intact distally. ? ?Lab and Radiology Results ? ?\Procedure: Real-time Ultrasound Guided Injection of left shoulder subacromial bursa ?Device: Philips Affiniti 50G ?Images permanently stored and available for review in PACS ?Ultrasound evaluation prior to injection reveals intact rotator cuff tendons with moderate to significant subacromial bursitis.  No visible rotator cuff tear is present on ultrasound examination today. ?Verbal informed consent obtained.  Discussed risks and benefits of procedure. Warned about infection, bleeding, hyperglycemia damage to structures among others. ?Patient expresses understanding  and agreement ?Time-out conducted.   ?Noted no overlying erythema, induration, or other signs of local infection.   ?Skin prepped in a sterile fashion.   ?Local anesthesia: Topical Ethyl chloride.   ?With sterile technique and under real time ultrasound guidance: 40 mg of Kenalog and 2 mL of Marcaine injected into subacromial bursa. Fluid seen entering the bursa.   ?Completed without difficulty   ?Pain immediately resolved suggesting accurate placement of the medication.   ?Advised to call if fevers/chills, erythema, induration, drainage, or persistent bleeding.   ?Images permanently stored and available for review in the ultrasound unit.  ?Impression: Technically successful ultrasound guided injection. ? ? ? ?X-ray images left shoulder obtained today personally and independently interpreted. ?Mild glenohumeral and moderate AC DJD present. ?No acute fractures. ? ? ?Impression and Recommendations:   ? ?Assessment and Plan: ?76 y.o. female with left shoulder pain predominantly thought to be due to subacromial bursitis.  Plan for subacromial injection and physical therapy referral.  Recheck back in about weeks.  Return sooner if needed.. ? ?PDMP not reviewed this encounter. ?Orders Placed This Encounter  ?Procedures  ? [place] LIMITED JOINT SPACE STRUCTURES UP LEFT(NO LINKED CHARGES)  ?  Order Specific Question:   Reason for Exam (SYMPTOM  OR DIAGNOSIS REQUIRED)  ?  Answer:   L shoulder pain  ?  Order Specific Question:   Preferred imaging location?  ?  Answer:   [facility]  ? DG Shoulder Left  ?  Standing Status:   Future  ?  Number of Occurrences:   1  ?  Standing Expiration Date:   08/29/2021  ?  Order Specific Question:   Reason for  Exam (SYMPTOM  OR DIAGNOSIS REQUIRED)  ?  Answer:   L shoulder pain  ?  Order Specific Question:   Preferred imaging location?  ?  Answer:   [facility]  ? Ambulatory referral to Physical Therapy  ?  Referral Priority:   Routine  ?  Referral Type:   Physical  Medicine  ?  Referral Reason:   Specialty Services Required  ?  Requested Specialty:   Physical Therapy  ?  Number of Visits Requested:   1  ? ?No orders of the defined types were placed in this encounter. ? ? ?Discussed warning signs or symptoms. Please see discharge instructions. Patient expresses understanding. ? ? ?The above documentation has been reviewed and is accurate and complete Lynne Leader, M.D. ? ?

## 2021-07-30 ENCOUNTER — Ambulatory Visit (INDEPENDENT_AMBULATORY_CARE_PROVIDER_SITE_OTHER): Payer: Medicare HMO | Admitting: Family Medicine

## 2021-07-30 ENCOUNTER — Ambulatory Visit (INDEPENDENT_AMBULATORY_CARE_PROVIDER_SITE_OTHER): Payer: Medicare HMO

## 2021-07-30 ENCOUNTER — Encounter: Payer: Self-pay | Admitting: Family Medicine

## 2021-07-30 ENCOUNTER — Ambulatory Visit: Payer: Self-pay

## 2021-07-30 VITALS — BP 120/80 | HR 89 | Ht 59.0 in | Wt 123.6 lb

## 2021-07-30 DIAGNOSIS — G8929 Other chronic pain: Secondary | ICD-10-CM | POA: Diagnosis not present

## 2021-07-30 DIAGNOSIS — M25512 Pain in left shoulder: Secondary | ICD-10-CM

## 2021-07-30 NOTE — Patient Instructions (Addendum)
Nice to meet you today. ? ?You had a L shoulder (subacromial) injection.  Call or go to the ER if you develop a large red swollen joint with extreme pain or oozing puss.  ? ?I've referred you to Physical Therapy.  Their office will call you to schedule but please let us know if you don't hear from them in one week regarding scheduling. ? ?Follow-up: 8 weeks ?

## 2021-07-31 NOTE — Progress Notes (Signed)
The shoulder joint itself looks okay.  The radiologist is not sure about your left third rib.  They are a little worried about a broken rib.  Looking at the x-ray myself I cannot see it but we do not have dedicated rib x-rays.  If you not hurting in the chest itself I do not think we need to get special rib x-rays.  If you are please let me know and we can get some dedicated rib x-rays to take a better look.

## 2021-08-03 ENCOUNTER — Telehealth: Payer: Self-pay | Admitting: Physical Therapy

## 2021-08-03 ENCOUNTER — Ambulatory Visit: Payer: Medicare HMO | Attending: Family Medicine | Admitting: Physical Therapy

## 2021-08-03 ENCOUNTER — Encounter: Payer: Self-pay | Admitting: Physical Therapy

## 2021-08-03 DIAGNOSIS — G8929 Other chronic pain: Secondary | ICD-10-CM | POA: Insufficient documentation

## 2021-08-03 DIAGNOSIS — M25512 Pain in left shoulder: Secondary | ICD-10-CM | POA: Diagnosis not present

## 2021-08-03 DIAGNOSIS — M6281 Muscle weakness (generalized): Secondary | ICD-10-CM | POA: Diagnosis not present

## 2021-08-03 DIAGNOSIS — R293 Abnormal posture: Secondary | ICD-10-CM | POA: Diagnosis not present

## 2021-08-03 NOTE — Telephone Encounter (Signed)
Called pt to relay her L shoulder XR results.  Pt states that she would like to get an additional XR to r/o any potential rib fracture.  Please advise as to which XR you would like.  Pt plans to come in on Thursday to get her XR. ?

## 2021-08-03 NOTE — Addendum Note (Signed)
Addended by: Gregor Hams on: 08/03/2021 12:32 PM ? ? Modules accepted: Orders ? ?

## 2021-08-03 NOTE — Therapy (Signed)
[facility] ?[place] [address] ?[place], [place], [address] ?Phone: [phone]   Fax:  [phone] ? ?Physical Therapy Evaluation ? ?Patient Details  ?Name: Heidi Shah ?MRN: [id number] ?Date of Birth: 05-21-1945 ?Referring Provider (PT): Lynne Leader ? ? ?Encounter Date: 08/03/2021 ? ? PT End of Session - 08/03/21 0939   ? ? Visit Number 1   ? Number of Visits 13   ? Authorization Type [employer]   ? Authorization Time Period 08/03/21 to 09/21/21 for schedule flexbility   ? Progress Note Due on Visit 10   ? PT Start Time [phone]   ? PT Stop Time [phone]   ? PT Time Calculation (min) 41 min   ? Activity Tolerance Patient tolerated treatment well   ? Behavior During Therapy [facility] for tasks assessed/performed   ? ?  ?  ? ?  ? ? ?Past Medical History:  ?Diagnosis Date  ? Allergy   ? Anxiety   ? Cataract   ? surgery - Bilateral cataracts removed  ? Chicken pox   ? Colon polyps   ? Depression   ? Diverticulitis   ? Diverticulosis   ? Esophageal stricture   ? GERD (gastroesophageal reflux disease)   ? Heart murmur   ? Hypertension   ? ? ?Past Surgical History:  ?Procedure Laterality Date  ? cataract surgery Bilateral 1996  ? COLONOSCOPY    ? EYE SURGERY  2000  ? detached L retina  ? HERNIA REPAIR  1990  ? NECK SURGERY    ? as a child  ? POLYPECTOMY    ? UPPER GASTROINTESTINAL ENDOSCOPY    ? ? ?There were no vitals filed for this visit. ? ? ? Subjective Assessment - 08/03/21 0848   ? ? Subjective I don't know what happened to my shoulder. 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 Ibuprofen and prednisone didn't help. I got the shoulder injection on Thursday, MD ok with me starting PT today. My shoulder is a lot better than it was but raising it up really hurts. I couldn't raise it up before and I couldn't put it behind my back before, better now but both motions are still sore. Sometimes I have numbness  and tingling in hand once or twice a week when I'm holding something but this this was happening before this.   ? Patient Stated Goals be able to get arm back to normal as possible   ? Currently in Pain? No/denies   reaching behind back, 6/10 goes away as soon as I stop that motion  ? ?  ?  ? ?  ? ? ? ? ? [facility] PT Assessment - 08/03/21 0001   ? ?  ? Assessment  ? Medical Diagnosis L shoulder pain   ? Referring Provider (PT) Lynne Leader   ? Onset Date/Surgical Date --   end of March 2023  ? Next MD Visit Georgina Snell end of June   ? Prior Therapy PT for balance   ?  ? Precautions  ? Precautions None   ?  ? Restrictions  ? Weight Bearing Restrictions No   ?  ? Balance Screen  ? Has the patient fallen in the past 6 months No   ? Has the patient had a decrease in activity level because of a fear of falling?  Yes   ? Is the patient reluctant  to leave their home because of a fear of falling?  No   ?  ? Home Environment  ? Living Environment Private residence   ?  ? Prior Function  ? Level of Independence Independent;Independent with basic ADLs;Independent with gait;Independent with transfers   ? Vocation Part time employment   ? Vocation Requirements teacher   ? Leisure read, walk, visiting grandkids   ?  ? Observation/Other Assessments  ? Observations empty can test (-) B   ? Focus on Therapeutic Outcomes (FOTO)  65.7   ?  ? Posture/Postural Control  ? Posture/Postural Control Postural limitations   ? Postural Limitations Rounded Shoulders;Forward head;Flexed trunk;Increased thoracic kyphosis   ?  ? ROM / Strength  ? AROM / PROM / Strength AROM;Strength   ?  ? AROM  ? AROM Assessment Site Shoulder   ? Right/Left Shoulder Right;Left   ? Right Shoulder Flexion --   WNL painful  ? Right Shoulder ABduction --   WNL but painful  ? Right Shoulder Internal Rotation --   FIR T10  ? Right Shoulder External Rotation --   FER T4  ? Left Shoulder Flexion --   WNL  ? Left Shoulder ABduction --   WNL  ? Left Shoulder Internal Rotation --   FIR  hypermobile  ? Left Shoulder External Rotation --   FER T4  ?  ? Strength  ? Overall Strength Comments grip strength symmetrical, no n/t R UE   ? Strength Assessment Site Shoulder;Elbow   ? Right/Left Shoulder Left;Right   ? Right Shoulder Flexion 4+/5   ? Right Shoulder Extension 4/5   ? Right Shoulder ABduction 4+/5   ? Right Shoulder Internal Rotation 4/5   ? Right Shoulder External Rotation 4/5   ? Left Shoulder Flexion 4+/5   ? Left Shoulder Extension 4/5   ? Left Shoulder ABduction 4+/5   ? Left Shoulder Internal Rotation 4/5   ? Right/Left Elbow Left;Right   ? Right Elbow Flexion 4+/5   ? Right Elbow Extension 3/5   ? Left Elbow Flexion 4+/5   ? Left Elbow Extension 3/5   ?  ? Palpation  ? Palpation comment more mm spasm and tightness in L UT   ? ?  ?  ? ?  ? ? ? ? ? ? ? ? ? ? ? ? ? ?Objective measurements completed on examination: See above findings.  ? ? ? ? ? [facility] Adult PT Treatment/Exercise - 08/03/21 0001   ? ?  ? Exercises  ? Exercises Shoulder   ?  ? Shoulder Exercises: Seated  ? Flexion Left;15 reps;Theraband   ? Theraband Level (Shoulder Flexion) Level 2 (Red)   ? Abduction Left;10 reps;Theraband   ? Theraband Level (Shoulder ABduction) Level 1 (Yellow)   ?  ? Shoulder Exercises: Standing  ? Other Standing Exercises scapulr retraction red TB 1x10, shoulder extensions 1x10 red TB   ? ?  ?  ? ?  ? ? ? ? ? ? ? ? ? ? PT Education - 08/03/21 0939   ? ? Education Details exam findings, HEP, POC, anatomy of affected region, caution with overuse related irritation   ? Person(s) Educated Patient   ? Methods Explanation;Demonstration;Handout   ? Comprehension Verbalized understanding;Returned demonstration   ? ?  ?  ? ?  ? ? ? PT Short Term Goals - 08/03/21 0945   ? ?  ? PT SHORT TERM GOAL #1  ? Title Will be compliant with appropriate  progressive HEP   ? Time 3   ? Period Weeks   ? Status New   ? Target Date 08/24/21   ?  ? PT SHORT TERM GOAL #2  ? Title Will have better understanding of posture and postural  mechanics   ? Time 3   ? Period Weeks   ? Status New   ? ?  ?  ? ?  ? ? ? ? PT Long Term Goals - 08/03/21 0945   ? ?  ? PT LONG TERM GOAL #1  ? Title MMT to improve by at least 1 grade in all weak groups   ? Time 6   ? Period Weeks   ? Status New   ? Target Date 09/14/21   ?  ? PT LONG TERM GOAL #2  ? Title Will be able to put a gallon of milk on an overhead shelf and reach up to pull the cord on a fan overhead without pain   ? Time 6   ? Period Weeks   ? Status New   ?  ? PT LONG TERM GOAL #3  ? Title Will be able to reach behind her to do her bra strap without pain   ? Time 6   ? Period Weeks   ? Status New   ?  ? PT LONG TERM GOAL #4  ? Title Will have good understanding of safe reps/sets at the gym to prevent reinjury/recurrence of pain   ? Time 6   ? Period Weeks   ? Status New   ? ?  ?  ? ?  ? ? ? ? ? ? ? ? ? Plan - 08/03/21 0942   ? ? Clinical Impression Statement [place] arrives today doing well, her shoulder is feeling better after the injection by Dr. Georgina Snell but she continues to have pain. She does tell me that she had started going to [employer] and doing 3 sets of 15 recently before the pain started.  Exam reveals mild soft tissue limitations, functional muscle weakness, and postural impairment. Will benefit from skilled PT services to address pain and optimize function.   ? Personal Factors and Comorbidities Time since onset of injury/illness/exacerbation;Fitness   ? Examination-Activity Limitations Reach Overhead;Caring for Others;Carry;Dressing;Lift   ? Examination-Participation Restrictions Meal Prep;Occupation;Cleaning;Community Activity;Interpersonal Relationship;Shop;Laundry;[employer] Work   ? Stability/Clinical Decision Making Stable/Uncomplicated   ? Clinical Decision Making Low   ? Rehab Potential Good   ? PT Frequency 2x / week   ? PT Duration 6 weeks   ? PT Treatment/Interventions Moist Heat;Cryotherapy;Electrical Stimulation;Ultrasound;Therapeutic exercise;Neuromuscular re-education;Manual  techniques;Therapeutic activities;ADLs/Self Care Home Management   ? PT Next Visit Plan focus on strength and posture; no ionto just had shoulder injection   ? PT Home Exercise Plan resisted shoulder flex

## 2021-08-03 NOTE — Telephone Encounter (Signed)
Already advised pt to arrive anytime between 8am-4:30 pm for XR. ?

## 2021-08-03 NOTE — Telephone Encounter (Signed)
X-ray ordered.  Please let the front desk know when you plan on being here on Thursday so that we can be prepared. ?

## 2021-08-06 ENCOUNTER — Ambulatory Visit (INDEPENDENT_AMBULATORY_CARE_PROVIDER_SITE_OTHER): Payer: Medicare HMO

## 2021-08-06 DIAGNOSIS — M25512 Pain in left shoulder: Secondary | ICD-10-CM

## 2021-08-06 DIAGNOSIS — G8929 Other chronic pain: Secondary | ICD-10-CM | POA: Diagnosis not present

## 2021-08-06 DIAGNOSIS — S2242XA Multiple fractures of ribs, left side, initial encounter for closed fracture: Secondary | ICD-10-CM | POA: Diagnosis not present

## 2021-08-10 ENCOUNTER — Encounter: Payer: Self-pay | Admitting: Physical Therapy

## 2021-08-10 ENCOUNTER — Ambulatory Visit: Payer: Medicare HMO | Admitting: Physical Therapy

## 2021-08-10 DIAGNOSIS — R293 Abnormal posture: Secondary | ICD-10-CM

## 2021-08-10 DIAGNOSIS — G8929 Other chronic pain: Secondary | ICD-10-CM | POA: Diagnosis not present

## 2021-08-10 DIAGNOSIS — M25512 Pain in left shoulder: Secondary | ICD-10-CM | POA: Diagnosis not present

## 2021-08-10 DIAGNOSIS — M6281 Muscle weakness (generalized): Secondary | ICD-10-CM | POA: Diagnosis not present

## 2021-08-10 NOTE — Therapy (Signed)
[facility] ?[address] ?[address] ?[place], [place], [address] ?Phone: [phone]   Fax:  [phone] ? ?Physical Therapy Treatment ? ?Patient Details  ?Name: Heidi Shah ?MRN: [id number] ?Date of Birth: Aug 07, 1945 ?Referring Provider (PT): Lynne Leader ? ? ?Encounter Date: 08/10/2021 ? ? PT End of Session - 08/10/21 1612   ? ? Visit Number 2   ? Number of Visits 13   ? Authorization Type [employer]   ? Authorization Time Period 08/03/21 to 09/21/21 for schedule flexbility   ? Progress Note Due on Visit 10   ? PT Start Time 1533   ? PT Stop Time 1612   ? PT Time Calculation (min) 39 min   ? Activity Tolerance Patient tolerated treatment well   ? Behavior During Therapy [facility] for tasks assessed/performed   ? ?  ?  ? ?  ? ? ?Past Medical History:  ?Diagnosis Date  ? Allergy   ? Anxiety   ? Cataract   ? surgery - Bilateral cataracts removed  ? Chicken pox   ? Colon polyps   ? Depression   ? Diverticulitis   ? Diverticulosis   ? Esophageal stricture   ? GERD (gastroesophageal reflux disease)   ? Heart murmur   ? Hypertension   ? ? ?Past Surgical History:  ?Procedure Laterality Date  ? cataract surgery Bilateral 1996  ? COLONOSCOPY    ? EYE SURGERY  2000  ? detached L retina  ? HERNIA REPAIR  1990  ? NECK SURGERY    ? as a child  ? POLYPECTOMY    ? UPPER GASTROINTESTINAL ENDOSCOPY    ? ? ?There were no vitals filed for this visit. ? ? Subjective Assessment - 08/10/21 1535   ? ? Subjective Shoulder is feeling better, it catches in the front sometimes, It pops sometimes but it doesn't hurt   ? Patient Stated Goals be able to get arm back to normal as possible   ? Currently in Pain? Yes   ? Pain Score 1    ? Pain Location Shoulder   ? Pain Orientation Left   ? Pain Descriptors / Indicators Discomfort   ? Pain Type Chronic pain   ? ?  ?  ? ?  ? ? ? ? ? ? ? ? ? ? ? ? ? ? ? ? ? ? ? ? [facility] Adult PT Treatment/Exercise - 08/10/21 0001   ? ?  ? Shoulder Exercises: Supine  ? Protraction 10  reps;Left   2#  ? Flexion Both;10 reps   ? Shoulder Flexion Weight (lbs) 3   ? ABduction Both;10 reps   ? Shoulder ABduction Weight (lbs) 3 on the R, 0 on the L due to anterior shoulder pain   ? Other Supine Exercises supine thoracic rotation stretch x2 minutes; UE flexion 1x10 over towel roll   ?  ? Shoulder Exercises: Seated  ? Other Seated Exercises UBE L3 x3 min forward/x3 min back   ? Other Seated Exercises backwards shoulder rolls 1x20, thoracic extensions 1x15; OH press 2# 1x15   ?  ? Shoulder Exercises: Prone  ? Other Prone Exercises blackburn 6 0# 1x10   ?  ? Shoulder Exercises: Standing  ? Other Standing Exercises wall pushups 1x10   ? Other Standing Exercises 3 way shoulder stabiity 1x10 B red TB; stability sshoulder flexion raises 1x10 red TB   ? ?  ?  ? ?  ? ? ? ? ? ? ? ? ? ?  PT Education - 08/10/21 1612   ? ? Education Details exercise form and purpose   ? Person(s) Educated Patient   ? Methods Explanation   ? Comprehension Verbalized understanding   ? ?  ?  ? ?  ? ? ? PT Short Term Goals - 08/03/21 0945   ? ?  ? PT SHORT TERM GOAL #1  ? Title Will be compliant with appropriate progressive HEP   ? Time 3   ? Period Weeks   ? Status New   ? Target Date 08/24/21   ?  ? PT SHORT TERM GOAL #2  ? Title Will have better understanding of posture and postural mechanics   ? Time 3   ? Period Weeks   ? Status New   ? ?  ?  ? ?  ? ? ? ? PT Long Term Goals - 08/03/21 0945   ? ?  ? PT LONG TERM GOAL #1  ? Title MMT to improve by at least 1 grade in all weak groups   ? Time 6   ? Period Weeks   ? Status New   ? Target Date 09/14/21   ?  ? PT LONG TERM GOAL #2  ? Title Will be able to put a gallon of milk on an overhead shelf and reach up to pull the cord on a fan overhead without pain   ? Time 6   ? Period Weeks   ? Status New   ?  ? PT LONG TERM GOAL #3  ? Title Will be able to reach behind her to do her bra strap without pain   ? Time 6   ? Period Weeks   ? Status New   ?  ? PT LONG TERM GOAL #4  ? Title Will have  good understanding of safe reps/sets at the gym to prevent reinjury/recurrence of pain   ? Time 6   ? Period Weeks   ? Status New   ? ?  ?  ? ?  ? ? ? ? ? ? ? ? Plan - 08/10/21 1613   ? ? Clinical Impression Statement Dail arrives today doing well, shoulder is doing better but sometimes she still has a little pain and it still can feel like its catching in the front. Focused primarily on shoulder strength and posture training today. Seems to be making good progress already.   ? Personal Factors and Comorbidities Time since onset of injury/illness/exacerbation;Fitness   ? Examination-Activity Limitations Reach Overhead;Caring for Others;Carry;Dressing;Lift   ? Examination-Participation Restrictions Meal Prep;Occupation;Cleaning;Community Activity;Interpersonal Relationship;Shop;Laundry;[employer] Work   ? Stability/Clinical Decision Making Stable/Uncomplicated   ? Clinical Decision Making Low   ? Rehab Potential Good   ? PT Frequency 2x / week   ? PT Duration 6 weeks   ? PT Treatment/Interventions Moist Heat;Cryotherapy;Electrical Stimulation;Ultrasound;Therapeutic exercise;Neuromuscular re-education;Manual techniques;Therapeutic activities;ADLs/Self Care Home Management   ? PT Next Visit Plan focus on strength and posture; no ionto just had shoulder injection   ? PT Home Exercise Plan resisted shoulder flexion, shoulder ABD, scap retraction, shoulder extensions   ? Consulted and Agree with Plan of Care Patient   ? ?  ?  ? ?  ? ? ?Patient will benefit from skilled therapeutic intervention in order to improve the following deficits and impairments:  Increased fascial restricitons, Increased muscle spasms, Impaired UE functional use, Decreased activity tolerance, Pain, Impaired flexibility, Improper body mechanics, Decreased strength, Postural dysfunction ? ?Visit Diagnosis: ?Chronic left shoulder pain ? ?Muscle weakness (  generalized) ? ?Abnormal posture ? ? ? ? ?Problem List ?Patient Active Problem List  ? Diagnosis Date  Noted  ? Cervical spondylosis with myelopathy and radiculopathy 03/14/2020  ? Neck pain 03/14/2020  ? Aortic heart murmur 02/25/2020  ? Osteoporosis 04/04/2019  ? 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  ? Unspecified constipation 03/05/2013  ? GERD (gastroesophageal reflux disease) 01/19/2013  ? Esophageal spasm 01/19/2013  ? Dysphagia, unspecified(787.20) 01/19/2013  ? Diverticulitis of colon (without mention of hemorrhage)(562.11) 11/23/2012  ? History of depression 11/23/2012  ? Essential hypertension, benign 11/23/2012  ? ?Amia Rynders U PT, DPT, PN2  ? ?Supplemental Physical Therapist ?[facility]  ? ? ? ? ? ? ?[place] ?[address] ?[place], [place], [address] ?Phone: [phone]   Fax:  [phone] ? ?Name: Heidi Shah ?MRN: [id number] ?Date of Birth: 01/05/1946 ? ? ? ?

## 2021-08-10 NOTE — Progress Notes (Signed)
Radiology report shows dedicated rib x-rays do show chronic fifth and sixth rib fractures.  I have a hard time seeing them myself.

## 2021-08-12 ENCOUNTER — Ambulatory Visit: Payer: Medicare HMO | Admitting: Physical Therapy

## 2021-08-14 ENCOUNTER — Encounter: Payer: Self-pay | Admitting: Physical Therapy

## 2021-08-14 ENCOUNTER — Ambulatory Visit: Payer: Medicare HMO | Admitting: Physical Therapy

## 2021-08-14 DIAGNOSIS — R293 Abnormal posture: Secondary | ICD-10-CM

## 2021-08-14 DIAGNOSIS — M25512 Pain in left shoulder: Secondary | ICD-10-CM | POA: Diagnosis not present

## 2021-08-14 DIAGNOSIS — G8929 Other chronic pain: Secondary | ICD-10-CM

## 2021-08-14 DIAGNOSIS — M6281 Muscle weakness (generalized): Secondary | ICD-10-CM | POA: Diagnosis not present

## 2021-08-14 NOTE — Therapy (Signed)
[place]. [place], [place], [address] Phone: [phone]   Fax:  [phone]  Physical Therapy Treatment  Patient Details  Name: Heidi Shah MRN: [id number] Date of Birth: 06/26/1945 Referring Provider (PT): Lynne Leader   Encounter Date: 08/14/2021   PT End of Session - 08/14/21 0842     Visit Number 3    Number of Visits 13    Authorization Type [employer]    Authorization Time Period 08/03/21 to 09/21/21 for schedule flexbility    Progress Note Due on Visit 10    PT Start Time 0809   arrived late   PT Stop Time 0841    PT Time Calculation (min) 32 min    Activity Tolerance Patient tolerated treatment well    Behavior During Therapy [facility] for tasks assessed/performed             Past Medical History:  Diagnosis Date   Allergy    Anxiety    Cataract    surgery - Bilateral cataracts removed   Chicken pox    Colon polyps    Depression    Diverticulitis    Diverticulosis    Esophageal stricture    GERD (gastroesophageal reflux disease)    Heart murmur    Hypertension     Past Surgical History:  Procedure Laterality Date   cataract surgery Bilateral [place]   detached L retina   [place]     as a child   POLYPECTOMY     UPPER GASTROINTESTINAL ENDOSCOPY      There were no vitals filed for this visit.   Subjective Assessment - 08/14/21 0810     Subjective Feeling better no pain since last time    Patient Stated Goals be able to get arm back to normal as possible    Currently in Pain? No/denies                               [facility] Adult PT Treatment/Exercise - 08/14/21 0001       Shoulder Exercises: Seated   Other Seated Exercises UBE L3 x3 min forward/x3 min back      Shoulder Exercises: Standing   Other Standing Exercises standing 3 way shoulder 2# 1x15 each way; wall pushups regular 1x15, tricep  wall pushups 1x10    Other Standing Exercises OH shoulder press 1x15 B 2#; diagonals 2# weight 1x10 B      Shoulder Exercises: Power Development worker, community 15 reps    Extension Limitations 5#    Retraction 15 reps    Retraction Limitations 5#    Other Power Economist 10# x15      Shoulder Exercises: Body Blade   Other Body Blade Exercises forward horizontal and vertical shakes 2x30 seconds B                     PT Education - 08/14/21 0841     Education Details exercise form, would really benefit from personal trainer to ensure safety and good technique with exercise at the gym when we clear her to return    Person(s) Educated Patient    Methods Explanation    Comprehension Verbalized understanding              PT Short Term Goals -  08/03/21 0945       PT SHORT TERM GOAL #1   Title Will be compliant with appropriate progressive HEP    Time 3    Period Weeks    Status New    Target Date 08/24/21      PT SHORT TERM GOAL #2   Title Will have better understanding of posture and postural mechanics    Time 3    Period Weeks    Status New               PT Long Term Goals - 08/03/21 0945       PT LONG TERM GOAL #1   Title MMT to improve by at least 1 grade in all weak groups    Time 6    Period Weeks    Status New    Target Date 09/14/21      PT LONG TERM GOAL #2   Title Will be able to put a gallon of milk on an overhead shelf and reach up to pull the cord on a fan overhead without pain    Time 6    Period Weeks    Status New      PT LONG TERM GOAL #3   Title Will be able to reach behind her to do her bra strap without pain    Time 6    Period Weeks    Status New      PT LONG TERM GOAL #4   Title Will have good understanding of safe reps/sets at the gym to prevent reinjury/recurrence of pain    Time [place] - 08/14/21 0842     Clinical Impression Statement  Sreya arrived late today. We continued focus on functional strengthening, still needs a lot of cues due to really poor kinesthetic awareness. Really vague in describing symptoms during exercise today. Will continue efforts, I think building strength here will really be key to getting her feeling better. I think she may benefit from having a couple of sessions with personal trainer at the gym to ensure safety with machines and equipment at that location- high risk to reinjure herself with poor exercise form.    Personal Factors and Comorbidities Time since onset of injury/illness/exacerbation;Fitness    Examination-Activity Limitations Reach Overhead;Caring for Others;Carry;Dressing;Lift    Examination-Participation Restrictions Meal Prep;Occupation;Cleaning;Community Activity;Interpersonal Relationship;Shop;Laundry;Yard Work    Stability/Clinical Decision Making Stable/Uncomplicated    Clinical Decision Making Low    Rehab Potential Good    PT Frequency 2x / week    PT Duration 6 weeks    PT Treatment/Interventions Moist Heat;Cryotherapy;Electrical Stimulation;Ultrasound;Therapeutic exercise;Neuromuscular re-education;Manual techniques;Therapeutic activities;ADLs/Self Care Home Management    PT Next Visit Plan focus on strength and posture; no ionto just had shoulder injection    PT Home Exercise Plan resisted shoulder flexion, shoulder ABD, scap retraction, shoulder extensions    Consulted and Agree with Plan of Care Patient             Patient will benefit from skilled therapeutic intervention in order to improve the following deficits and impairments:  Increased fascial restricitons, Increased muscle spasms, Impaired UE functional use, Decreased activity tolerance, Pain, Impaired flexibility, Improper body mechanics, Decreased strength, Postural dysfunction  Visit Diagnosis: Chronic left shoulder pain  Abnormal posture  Muscle weakness (generalized)     Problem List Patient  Active Problem List   Diagnosis Date Noted   Cervical spondylosis with myelopathy and radiculopathy 03/14/2020   Neck pain 03/14/2020   Aortic heart murmur 02/25/2020   Osteoporosis 04/04/2019   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   Unspecified constipation 03/05/2013   GERD (gastroesophageal reflux disease) 01/19/2013   Esophageal spasm 01/19/2013   Dysphagia, unspecified(787.20) 01/19/2013   Diverticulitis of colon (without mention of hemorrhage)(562.11) 11/23/2012   History of depression 11/23/2012   Essential hypertension, benign 11/23/2012   Ann Lions PT, DPT, PN2   Supplemental Physical Therapist [place] [place], [place], [address] Phone: [phone]   Fax:  [phone]  Name: Heidi Shah MRN: [id number] Date of Birth: 1946-01-18

## 2021-08-17 ENCOUNTER — Ambulatory Visit: Payer: Medicare HMO | Admitting: Physical Therapy

## 2021-08-17 ENCOUNTER — Encounter: Payer: Self-pay | Admitting: Physical Therapy

## 2021-08-17 DIAGNOSIS — G8929 Other chronic pain: Secondary | ICD-10-CM | POA: Diagnosis not present

## 2021-08-17 DIAGNOSIS — R293 Abnormal posture: Secondary | ICD-10-CM | POA: Diagnosis not present

## 2021-08-17 DIAGNOSIS — M6281 Muscle weakness (generalized): Secondary | ICD-10-CM

## 2021-08-17 DIAGNOSIS — M25512 Pain in left shoulder: Secondary | ICD-10-CM | POA: Diagnosis not present

## 2021-08-17 NOTE — Therapy (Signed)
[place]. [place], [place], [address] Phone: [phone]   Fax:  [phone]  Physical Therapy Treatment  Patient Details  Name: Heidi Shah MRN: [id number] Date of Birth: Oct 15, 1945 Referring Provider (PT): Lynne Leader   Encounter Date: 08/17/2021   PT End of Session - 08/17/21 1617     Visit Number 4    Number of Visits 13    Authorization Type [employer]    Authorization Time Period 08/03/21 to 09/21/21 for schedule flexbility    Progress Note Due on Visit 10    PT Start Time 0017    PT Stop Time 1612    PT Time Calculation (min) 39 min    Activity Tolerance Patient tolerated treatment well    Behavior During Therapy [facility] for tasks assessed/performed             Past Medical History:  Diagnosis Date   Allergy    Anxiety    Cataract    surgery - Bilateral cataracts removed   Chicken pox    Colon polyps    Depression    Diverticulitis    Diverticulosis    Esophageal stricture    GERD (gastroesophageal reflux disease)    Heart murmur    Hypertension     Past Surgical History:  Procedure Laterality Date   cataract surgery Bilateral [place]   detached L retina   [place]     as a child   POLYPECTOMY     UPPER GASTROINTESTINAL ENDOSCOPY      There were no vitals filed for this visit.   Subjective Assessment - 08/17/21 1534     Subjective I"m really feeling better would still like to spend some time building strength in my arm             Pain 0/10    [facility] PT Assessment - 08/17/21 0001       Strength   Right Shoulder Flexion 4+/5    Right Shoulder Extension 4+/5    Right Shoulder ABduction 4+/5    Right Shoulder Internal Rotation 4+/5    Right Shoulder External Rotation 4+/5    Left Shoulder Flexion 4+/5    Left Shoulder Extension 4+/5    Left Shoulder ABduction 4+/5    Left Shoulder Internal Rotation 4+/5     Left Shoulder External Rotation 4+/5                           [facility] Adult PT Treatment/Exercise - 08/17/21 0001       Shoulder Exercises: Standing   Other Standing Exercises standing 3 way shoulder 3# 1x15 each way; wall pushups regular 1x15, tricep wall pushups 1x10    Other Standing Exercises OH shoulder presses 1x10 B 3#; wall pushups wide 1x15, wall pushups tight for tricep activation 1x15; farmers carry with OH press 3kg ball 22f each UE; over shoulder ball chops 3kg ball 1x15 B; modified plank shoulder taps standing at mat table 2x10      Shoulder Exercises: Power TGeneticist, molecularLimitations 10#    Other Power Tower Exercises bicep curls 1x10 5# B;    Other Power THydrologistpulldowns 1x10 15#      Shoulder Exercises: Body Blade   Flexion Limitations x10 horizontal and x10 vertical  PT Education - 08/17/21 1617     Education Details HEP updates    Person(s) Educated Patient    Methods Explanation    Comprehension Verbalized understanding              PT Short Term Goals - 08/03/21 0945       PT SHORT TERM GOAL #1   Title Will be compliant with appropriate progressive HEP    Time 3    Period Weeks    Status New    Target Date 08/24/21      PT SHORT TERM GOAL #2   Title Will have better understanding of posture and postural mechanics    Time 3    Period Weeks    Status New               PT Long Term Goals - 08/03/21 0945       PT LONG TERM GOAL #1   Title MMT to improve by at least 1 grade in all weak groups    Time 6    Period Weeks    Status New    Target Date 09/14/21      PT LONG TERM GOAL #2   Title Will be able to put a gallon of milk on an overhead shelf and reach up to pull the cord on a fan overhead without pain    Time 6    Period Weeks    Status New      PT LONG TERM GOAL #3   Title Will be able to reach behind her to do her bra strap without  pain    Time 6    Period Weeks    Status New      PT LONG TERM GOAL #4   Title Will have good understanding of safe reps/sets at the gym to prevent reinjury/recurrence of pain    Time 6    Period [place] - 08/17/21 1617     Clinical Impression Statement Heidi Shah arrives doing well today, felt good after last session wants to continue building strength. Continued progressing strength as able today, still needs cues for good form with new activities but progressing well overall.    Personal Factors and Comorbidities Time since onset of injury/illness/exacerbation;Fitness    Examination-Activity Limitations Reach Overhead;Caring for Others;Carry;Dressing;Lift    Examination-Participation Restrictions Meal Prep;Occupation;Cleaning;Community Activity;Interpersonal Relationship;Shop;Laundry;Yard Work    Stability/Clinical Decision Making Stable/Uncomplicated    Clinical Decision Making Low    Rehab Potential Good    PT Frequency 2x / week    PT Duration 6 weeks    PT Treatment/Interventions Moist Heat;Cryotherapy;Electrical Stimulation;Ultrasound;Therapeutic exercise;Neuromuscular re-education;Manual techniques;Therapeutic activities;ADLs/Self Care Home Management    PT Next Visit Plan focus on strength and posture; no ionto just had shoulder injection    PT Home Exercise Plan resisted shoulder flexion, shoulder ABD, scap retraction, shoulder extensions; 3 way shoulder with 2#, OH Press with 2#    Consulted and Agree with Plan of Care Patient             Patient will benefit from skilled therapeutic intervention in order to improve the following deficits and impairments:  Increased fascial restricitons, Increased muscle spasms, Impaired UE functional use, Decreased activity tolerance, Pain, Impaired flexibility, Improper body mechanics, Decreased strength, Postural dysfunction  Visit Diagnosis: Chronic left shoulder pain  Abnormal  posture  Muscle  weakness (generalized)     Problem List Patient Active Problem List   Diagnosis Date Noted   Cervical spondylosis with myelopathy and radiculopathy 03/14/2020   Neck pain 03/14/2020   Aortic heart murmur 02/25/2020   Osteoporosis 04/04/2019   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   Unspecified constipation 03/05/2013   GERD (gastroesophageal reflux disease) 01/19/2013   Esophageal spasm 01/19/2013   Dysphagia, unspecified(787.20) 01/19/2013   Diverticulitis of colon (without mention of hemorrhage)(562.11) 11/23/2012   History of depression 11/23/2012   Essential hypertension, benign 11/23/2012   Ann Lions PT, DPT, PN2   Supplemental Physical Therapist [place]. [place], [place], [address] Phone: [phone]   Fax:  [phone]  Name: Heidi Shah MRN: [id number] Date of Birth: 20-Feb-1946

## 2021-08-19 ENCOUNTER — Ambulatory Visit: Payer: Medicare HMO | Admitting: Physical Therapy

## 2021-08-21 ENCOUNTER — Ambulatory Visit: Payer: Medicare HMO | Admitting: Physical Therapy

## 2021-08-21 ENCOUNTER — Encounter: Payer: Self-pay | Admitting: Physical Therapy

## 2021-08-21 DIAGNOSIS — M25512 Pain in left shoulder: Secondary | ICD-10-CM | POA: Diagnosis not present

## 2021-08-21 DIAGNOSIS — G8929 Other chronic pain: Secondary | ICD-10-CM | POA: Diagnosis not present

## 2021-08-21 DIAGNOSIS — R293 Abnormal posture: Secondary | ICD-10-CM | POA: Diagnosis not present

## 2021-08-21 DIAGNOSIS — M6281 Muscle weakness (generalized): Secondary | ICD-10-CM | POA: Diagnosis not present

## 2021-08-21 NOTE — Therapy (Signed)
[place]. [place], [place], [address] Phone: [phone]   Fax:  [phone]  Physical Therapy Treatment  Patient Details  Name: Heidi Shah MRN: [id number] Date of Birth: 1945-11-15 Referring Provider (PT): Lynne Leader   Encounter Date: 08/21/2021   PT End of Session - 08/21/21 0847     Visit Number 5    Number of Visits 13    Authorization Type [employer]    Authorization Time Period 08/03/21 to 09/21/21 for schedule flexbility    Progress Note Due on Visit 10    PT Start Time 0806   arrived late   PT Stop Time 0844    PT Time Calculation (min) 38 min    Activity Tolerance Patient tolerated treatment well    Behavior During Therapy [facility] for tasks assessed/performed             Past Medical History:  Diagnosis Date   Allergy    Anxiety    Cataract    surgery - Bilateral cataracts removed   Chicken pox    Colon polyps    Depression    Diverticulitis    Diverticulosis    Esophageal stricture    GERD (gastroesophageal reflux disease)    Heart murmur    Hypertension     Past Surgical History:  Procedure Laterality Date   cataract surgery Bilateral [place]   detached L retina   [place]     as a child   POLYPECTOMY     UPPER GASTROINTESTINAL ENDOSCOPY      There were no vitals filed for this visit.   Subjective Assessment - 08/21/21 0808     Subjective Doing good no changes, HEP additions are doing fine    Patient Stated Goals be able to get arm back to normal as possible    Currently in Pain? No/denies                               [facility] Adult PT Treatment/Exercise - 08/21/21 0001       Shoulder Exercises: Seated   Other Seated Exercises UBE L3.2 3 min backwards/3 min forward    Other Seated Exercises bicep curls 3 way 1x7 each angle B      Shoulder Exercises: Prone   Other Prone Exercises  blackburn 6 0# 1x15      Shoulder Exercises: Standing   Other Standing Exercises 3kg ball chops 1x15 B; OH 3kg ball carry 275f each LE; 3kg ball slams 1x10; wide pec push ups 1x10 mod cues for form    Other Standing Exercises elevated mat table pushpus 1x10 limited depth; 3kg halos 1x10 each CW and CCW      Shoulder Exercises: Power TDevelopment worker, community15 reps    Extension Limitations 10#                     PT Education - 08/21/21 [phone]    Education Details POC moving forward- check in on objective measures 6/2 since she has progressed well and quickly, continue to recommend personal trainer to make sure she has good form at the gym    Person(s) Educated Patient    Methods Explanation    Comprehension Verbalized understanding  PT Short Term Goals - 08/03/21 0945       PT SHORT TERM GOAL #1   Title Will be compliant with appropriate progressive HEP    Time 3    Period Weeks    Status New    Target Date 08/24/21      PT SHORT TERM GOAL #2   Title Will have better understanding of posture and postural mechanics    Time 3    Period Weeks    Status New               PT Long Term Goals - 08/03/21 0945       PT LONG TERM GOAL #1   Title MMT to improve by at least 1 grade in all weak groups    Time 6    Period Weeks    Status New    Target Date 09/14/21      PT LONG TERM GOAL #2   Title Will be able to put a gallon of milk on an overhead shelf and reach up to pull the cord on a fan overhead without pain    Time 6    Period Weeks    Status New      PT LONG TERM GOAL #3   Title Will be able to reach behind her to do her bra strap without pain    Time 6    Period Weeks    Status New      PT LONG TERM GOAL #4   Title Will have good understanding of safe reps/sets at the gym to prevent reinjury/recurrence of pain    Time [place] - 08/21/21 0847     Clinical Impression  Statement Kymorah arrives today doing OK, no changes since last time. Continued focus on upper body strength. I think maybe we can start gradually adding more to her HEP (for exercises she can do with good form) and start to work towards DC to advanced HEP. Continue to recommend her working with a Physiological scientist for 1-2 sessions at her gym of choice to ensure machine safety/good form however.    Personal Factors and Comorbidities Time since onset of injury/illness/exacerbation;Fitness    Examination-Activity Limitations Reach Overhead;Caring for Others;Carry;Dressing;Lift    Examination-Participation Restrictions Meal Prep;Occupation;Cleaning;Community Activity;Interpersonal Relationship;Shop;Laundry;Yard Work    Stability/Clinical Decision Making Stable/Uncomplicated    Clinical Decision Making Low    Rehab Potential Good    PT Frequency 2x / week    PT Duration 6 weeks    PT Treatment/Interventions Moist Heat;Cryotherapy;Electrical Stimulation;Ultrasound;Therapeutic exercise;Neuromuscular re-education;Manual techniques;Therapeutic activities;ADLs/Self Care Home Management    PT Next Visit Plan focus on strength and posture; no ionto just had shoulder injection. Reassess 6/2    PT Home Exercise Plan resisted shoulder flexion, shoulder ABD, scap retraction, shoulder extensions; 3 way shoulder with 2#, OH Press with 2#    Consulted and Agree with Plan of Care Patient             Patient will benefit from skilled therapeutic intervention in order to improve the following deficits and impairments:  Increased fascial restricitons, Increased muscle spasms, Impaired UE functional use, Decreased activity tolerance, Pain, Impaired flexibility, Improper body mechanics, Decreased strength, Postural dysfunction  Visit Diagnosis: Chronic left shoulder pain  Abnormal posture  Muscle weakness (generalized)     Problem List  Patient Active Problem List   Diagnosis Date Noted   Cervical  spondylosis with myelopathy and radiculopathy 03/14/2020   Neck pain 03/14/2020   Aortic heart murmur 02/25/2020   Osteoporosis 04/04/2019   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   Unspecified constipation 03/05/2013   GERD (gastroesophageal reflux disease) 01/19/2013   Esophageal spasm 01/19/2013   Dysphagia, unspecified(787.20) 01/19/2013   Diverticulitis of colon (without mention of hemorrhage)(562.11) 11/23/2012   History of depression 11/23/2012   Essential hypertension, benign 11/23/2012   Ann Lions PT, DPT, PN2   Supplemental Physical Therapist [place]. [place], [place], [address] Phone: [phone]   Fax:  [phone]  Name: JULE WHITSEL MRN: [id number] Date of Birth: 07-22-1945

## 2021-08-26 ENCOUNTER — Ambulatory Visit: Payer: Medicare HMO | Admitting: Physical Therapy

## 2021-08-28 ENCOUNTER — Encounter: Payer: Self-pay | Admitting: Physical Therapy

## 2021-08-28 ENCOUNTER — Ambulatory Visit: Payer: Medicare HMO | Attending: Family Medicine | Admitting: Physical Therapy

## 2021-08-28 DIAGNOSIS — G8929 Other chronic pain: Secondary | ICD-10-CM | POA: Diagnosis not present

## 2021-08-28 DIAGNOSIS — R293 Abnormal posture: Secondary | ICD-10-CM | POA: Diagnosis not present

## 2021-08-28 DIAGNOSIS — M6281 Muscle weakness (generalized): Secondary | ICD-10-CM | POA: Insufficient documentation

## 2021-08-28 DIAGNOSIS — M25512 Pain in left shoulder: Secondary | ICD-10-CM | POA: Diagnosis not present

## 2021-08-28 NOTE — Therapy (Signed)
[place]. [place], [place], [address] Phone: [phone]   Fax:  [phone]  Physical Therapy Treatment  Patient Details  Name: Heidi Shah MRN: [id number] Date of Birth: [birth year] Referring Provider (PT): Lynne Leader   Encounter Date: 08/28/2021   PT End of Session - 08/28/21 0840     Visit Number 6    Number of Visits 13    Authorization Type [employer]    Authorization Time Period 08/03/21 to 09/21/21 for schedule flexbility    Progress Note Due on Visit 10    PT Start Time 0805    PT Stop Time 0832    PT Time Calculation (min) 27 min    Activity Tolerance Patient tolerated treatment well    Behavior During Therapy [facility] for tasks assessed/performed             Past Medical History:  Diagnosis Date   Allergy    Anxiety    Cataract    surgery - Bilateral cataracts removed   Chicken pox    Colon polyps    Depression    Diverticulitis    Diverticulosis    Esophageal stricture    GERD (gastroesophageal reflux disease)    Heart murmur    Hypertension     Past Surgical History:  Procedure Laterality Date   cataract surgery Bilateral [place]   detached L retina   [place]     as a child   POLYPECTOMY     UPPER GASTROINTESTINAL ENDOSCOPY      There were no vitals filed for this visit.   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 Able to lift a 20# item out of car and carry it into house.    Patient Stated Goals be able to get arm back to normal as possible    Currently in Pain? No/denies                [facility] PT Assessment - 08/28/21 0001       Observation/Other Assessments   Focus on Therapeutic Outcomes (FOTO)  71.2      AROM   Right Shoulder Flexion --   WNL   Right Shoulder ABduction --   WNL   Right  Shoulder Internal Rotation --   FIR T7   Right Shoulder External Rotation --   FER T4   Left Shoulder Flexion --   WNL   Left Shoulder ABduction --   WNL   Left Shoulder Internal Rotation --   FIR T7   Left Shoulder External Rotation --   FER T4     Strength   Right Shoulder Flexion 5/5    Right Shoulder Extension 5/5    Right Shoulder ABduction 5/5    Right Shoulder Internal Rotation 5/5    Right Shoulder External Rotation 5/5    Left Shoulder Flexion 5/5    Left Shoulder Extension 4+/5    Left Shoulder ABduction 4+/5    Left Shoulder Internal Rotation 5/5    Left Shoulder External Rotation 5/5    Right Elbow Flexion 5/5    Right Elbow Extension 4/5    Left Elbow Flexion 5/5    Left Elbow Extension 4-/5      Palpation  Palpation comment has mm spasms and trigger points in B upper traps but symmetrical                           [facility] Adult PT Treatment/Exercise - 08/28/21 0001       Shoulder Exercises: Seated   Other Seated Exercises chair tricep dips 1x10    Other Seated Exercises UBE L3 backwards only x4 minutes      Shoulder Exercises: Standing   Other Standing Exercises OH press 3kg ball 1x10 B; halos 3kg ball x10 CW/x10 CCW                     PT Education - 08/28/21 0839     Education Details POC, one more session then DC    Person(s) Educated Patient    Methods Explanation    Comprehension Verbalized understanding              PT Short Term Goals - 08/28/21 0817       PT SHORT TERM GOAL #1   Title Will be compliant with appropriate progressive HEP    Time 3    Period Weeks    Status Achieved      PT SHORT TERM GOAL #2   Title Will have better understanding of posture and postural mechanics    Baseline 6/2- ongoing, discussed use of lumbar support    Time 3    Period Weeks    Status On-going               PT Long Term Goals - 08/28/21 7048       PT LONG TERM GOAL #1   Title MMT to improve by at least 1  grade in all weak groups    Baseline 6/2- improving    Time 6    Period Weeks    Status Partially Met      PT LONG TERM GOAL #2   Title Will be able to put a gallon of milk on an overhead shelf and reach up to pull the cord on a fan overhead without pain    Baseline 6/2- no issues    Time 6    Period Weeks    Status Achieved      PT LONG TERM GOAL #3   Title Will be able to reach behind her to do her bra strap without pain    Baseline 6/2- no issues    Time 6    Period Weeks    Status Achieved      PT LONG TERM GOAL #4   Title Will have good understanding of safe reps/sets at the gym to prevent reinjury/recurrence of pain    Baseline 6/2- discussed reps/sets and working with personal trainer at [employer] to ensure good form    Time 6    Period Weeks    Status Achieved                   Plan - 08/28/21 0840     Clinical Impression Statement Dahna arrives today doing well, tells me she is very happy with the progress she has made with her shoulder- able to complete all functional tasks without difficulty and strength has come along very well but L shoulder/UE is still slightly weaker than the R. Got some objective measures which also show improvement. Needed to leave early today. We will plan for one more session to finalize advanced HEP  and review gym equipment, then DC.    Personal Factors and Comorbidities Time since onset of injury/illness/exacerbation;Fitness    Examination-Activity Limitations Reach Overhead;Caring for Others;Carry;Dressing;Lift    Examination-Participation Restrictions Meal Prep;Occupation;Cleaning;Community Activity;Interpersonal Relationship;Shop;Laundry;Yard Work    Stability/Clinical Decision Making Stable/Uncomplicated    Clinical Decision Making Low    Rehab Potential Good    PT Frequency 2x / week    PT Duration 6 weeks    PT Treatment/Interventions Moist Heat;Cryotherapy;Electrical Stimulation;Ultrasound;Therapeutic  exercise;Neuromuscular re-education;Manual techniques;Therapeutic activities;ADLs/Self Care Home Management    PT Next Visit Plan finalized HEP, DC    PT Home Exercise Plan resisted shoulder flexion, shoulder ABD, scap retraction, shoulder extensions; 3 way shoulder with 2#, OH Press with 2#    Consulted and Agree with Plan of Care Patient             Patient will benefit from skilled therapeutic intervention in order to improve the following deficits and impairments:  Increased fascial restricitons, Increased muscle spasms, Impaired UE functional use, Decreased activity tolerance, Pain, Impaired flexibility, Improper body mechanics, Decreased strength, Postural dysfunction  Visit Diagnosis: Chronic left shoulder pain  Abnormal posture  Muscle weakness (generalized)     Problem List Patient Active Problem List   Diagnosis Date Noted   Cervical spondylosis with myelopathy and radiculopathy 03/14/2020   Neck pain 03/14/2020   Aortic heart murmur 02/25/2020   Osteoporosis 04/04/2019   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   Unspecified constipation 03/05/2013   GERD (gastroesophageal reflux disease) 01/19/2013   Esophageal spasm 01/19/2013   Dysphagia, unspecified(787.20) 01/19/2013   Diverticulitis of colon (without mention of hemorrhage)(562.11) 11/23/2012   History of depression 11/23/2012   Essential hypertension, benign 11/23/2012   Ann Lions PT, DPT, PN2   Supplemental Physical Therapist [place]. [place], [place], [address] Phone: [phone]   Fax:  [phone]  Name: NORLEEN XIE MRN: [id number] Date of Birth: 01-19-1946

## 2021-08-31 DIAGNOSIS — H524 Presbyopia: Secondary | ICD-10-CM | POA: Diagnosis not present

## 2021-08-31 DIAGNOSIS — Z0100 Encounter for examination of eyes and vision without abnormal findings: Secondary | ICD-10-CM | POA: Diagnosis not present

## 2021-08-31 DIAGNOSIS — H401131 Primary open-angle glaucoma, bilateral, mild stage: Secondary | ICD-10-CM | POA: Diagnosis not present

## 2021-09-04 ENCOUNTER — Encounter: Payer: Self-pay | Admitting: Physical Therapy

## 2021-09-04 ENCOUNTER — Ambulatory Visit: Payer: Medicare HMO | Admitting: Physical Therapy

## 2021-09-04 DIAGNOSIS — G8929 Other chronic pain: Secondary | ICD-10-CM

## 2021-09-04 DIAGNOSIS — R293 Abnormal posture: Secondary | ICD-10-CM

## 2021-09-04 DIAGNOSIS — M6281 Muscle weakness (generalized): Secondary | ICD-10-CM

## 2021-09-04 DIAGNOSIS — M25512 Pain in left shoulder: Secondary | ICD-10-CM | POA: Diagnosis not present

## 2021-09-04 NOTE — Therapy (Signed)
[place]. [place], [place], [address] Phone: [phone]   Fax:  [phone]  Physical Therapy Treatment  Patient Details  Name: Heidi Shah MRN: [id number] Date of Birth: July 01, 1945 Referring Provider (PT): Lynne Leader   Encounter Date: 09/04/2021  PHYSICAL THERAPY DISCHARGE SUMMARY  Visits from Start of Care: 7  Current functional level related to goals / functional outcomes: Pleased with functional status- DC today   Remaining deficits: Muscle weakness, postural impairment impaired kinesthetic awareness    Education / Equipment: See below    Patient agrees to discharge. Patient goals were partially met. Patient is being discharged due to being pleased with the current functional level.    PT End of Session - 09/04/21 0834     Visit Number 7    Number of Visits 7    Authorization Type [employer]    Authorization Time Period 08/03/21 to 09/21/21 for schedule flexbility    Progress Note Due on Visit 10    PT Start Time 0806   arrived late   PT Stop Time 0831    PT Time Calculation (min) 25 min    Activity Tolerance Patient tolerated treatment well    Behavior During Therapy WFL for tasks assessed/performed             Past Medical History:  Diagnosis Date   Allergy    Anxiety    Cataract    surgery - Bilateral cataracts removed   Chicken pox    Colon polyps    Depression    Diverticulitis    Diverticulosis    Esophageal stricture    GERD (gastroesophageal reflux disease)    Heart murmur    Hypertension     Past Surgical History:  Procedure Laterality Date   cataract surgery Bilateral 1996   COLONOSCOPY     EYE SURGERY  2000   detached L retina   [place]     as a child   POLYPECTOMY     UPPER GASTROINTESTINAL ENDOSCOPY      There were no vitals filed for this visit.   Subjective Assessment - 09/04/21 0808     Subjective I still feel good  enough to make today my last day, I am noticing that when I pick things up quickly I do have pain but it goes away quickly. I tripped over something and had to move my arm quickly to catch myself. I don't have pain if I'm moving slow and controlled.    Patient Stated Goals be able to get arm back to normal as possible    Currently in Pain? No/denies                               [facility] Adult PT Treatment/Exercise - 09/04/21 0001       Shoulder Exercises: ROM/Strengthening   UBE (Upper Arm Bike) L3 3 min forward/3 min back               Shoulder ER with scapular retraction red TB 1x15 Wall stars 3 way with red TB 1x5 B  Shoulder flexion with red TB stability squeeze 1x10 Wall ball 1x15 B CW/CCW       PT Education - 09/04/21 0833     Education Details appropriate reps/sets at the gym, HEP, personal trainer with return to [employer], DC today    Person(s) Educated  Patient    Methods Explanation    Comprehension Verbalized understanding              PT Short Term Goals - 08/28/21 0817       PT SHORT TERM GOAL #1   Title Will be compliant with appropriate progressive HEP    Time 3    Period Weeks    Status Achieved      PT SHORT TERM GOAL #2   Title Will have better understanding of posture and postural mechanics    Baseline 6/2- ongoing, discussed use of lumbar support    Time 3    Period Weeks    Status On-going               PT Long Term Goals - 08/28/21 0819       PT LONG TERM GOAL #1   Title MMT to improve by at least 1 grade in all weak groups    Baseline 6/2- improving    Time 6    Period Weeks    Status Partially Met      PT LONG TERM GOAL #2   Title Will be able to put a gallon of milk on an overhead shelf and reach up to pull the cord on a fan overhead without pain    Baseline 6/2- no issues    Time 6    Period Weeks    Status Achieved      PT LONG TERM GOAL #3   Title Will be able to reach behind her to do  her bra strap without pain    Baseline 6/2- no issues    Time 6    Period Weeks    Status Achieved      PT LONG TERM GOAL #4   Title Will have good understanding of safe reps/sets at the gym to prevent reinjury/recurrence of pain    Baseline 6/2- discussed reps/sets and working with personal trainer at [employer] to ensure good form    Time 6    Period Weeks    Status Achieved                   Plan - 09/04/21 0834     Clinical Impression Statement Stephine arrives today doing OK, still feels she is doing well enough for DC. Reviewed and added a couple of exercises to HEP, also discussed appropriate reps/sets/progression of resistance. DC today thank you for the referral!    Personal Factors and Comorbidities Time since onset of injury/illness/exacerbation;Fitness    Examination-Activity Limitations Reach Overhead;Caring for Others;Carry;Dressing;Lift    Examination-Participation Restrictions Meal Prep;Occupation;Cleaning;Community Activity;Interpersonal Relationship;Shop;Laundry;Yard Work    Stability/Clinical Decision Making Stable/Uncomplicated    Designer, jewellery Low    Rehab Potential Good    PT Frequency Other (comment)   DC today   PT Duration Other (comment)   DC today   PT Treatment/Interventions Moist Heat;Cryotherapy;Electrical Stimulation;Ultrasound;Therapeutic exercise;Neuromuscular re-education;Manual techniques;Therapeutic activities;ADLs/Self Care Home Management    PT Next Visit Plan DC    PT Home Exercise Plan [phone]    Consulted and Agree with Plan of Care Patient             Patient will benefit from skilled therapeutic intervention in order to improve the following deficits and impairments:  Increased fascial restricitons, Increased muscle spasms, Impaired UE functional use, Decreased activity tolerance, Pain, Impaired flexibility, Improper body mechanics, Decreased strength, Postural dysfunction  Visit Diagnosis: Chronic left shoulder  pain  Abnormal posture  Muscle weakness (generalized)     Problem List Patient Active Problem List   Diagnosis Date Noted   Cervical spondylosis with myelopathy and radiculopathy 03/14/2020   Neck pain 03/14/2020   Aortic heart murmur 02/25/2020   Osteoporosis 04/04/2019   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   Unspecified constipation 03/05/2013   GERD (gastroesophageal reflux disease) 01/19/2013   Esophageal spasm 01/19/2013   Dysphagia, unspecified(787.20) 01/19/2013   Diverticulitis of colon (without mention of hemorrhage)(562.11) 11/23/2012   History of depression 11/23/2012   Essential hypertension, benign 11/23/2012   Ann Lions PT, DPT, PN2   Supplemental Physical Therapist [place]. [place], [place], [address] Phone: [phone]   Fax:  [phone]  Name: CERIAH KOHLER MRN: [id number] Date of Birth: Dec 05, 1945

## 2021-09-11 ENCOUNTER — Other Ambulatory Visit: Payer: Self-pay | Admitting: Family Medicine

## 2021-09-24 ENCOUNTER — Ambulatory Visit: Payer: Medicare HMO | Admitting: Family Medicine

## 2021-09-24 NOTE — Progress Notes (Signed)
   I, Peterson Lombard, LAT, ATC acting as a scribe for Lynne Leader, MD.  Heidi Shah is a 76 y.o. female who presents to [place] at [facility] today for f/u of chronic L shoulder pain thought to be due to subacromial bursitis.  She was last seen by Dr. Georgina Snell on 07/30/21 and was referred to PT of which she completed 7 visits and was d/c.  Today, pt reports L shoulder is feeling 90% better. Pt continues to have intermittent pain w/ shoulder ABD and overhead motions, but the pain is not limiting.  Diagnostic testing: Chest/rib XR- 08/06/21; L shoulder XR- 07/30/21  Pertinent review of systems: No fevers or chills  Relevant historical information: Hypertension   Exam:  BP 122/84   Pulse 96   Ht '4\' 11"'$  (1.499 m)   Wt 126 lb 12.8 oz (57.5 kg)   SpO2 97%   BMI 25.61 kg/m  General: Well Developed, well nourished, and in no acute distress.   MSK: Left shoulder: Normal-appearing normal motion intact strength negative impingement testing.    Lab and Radiology Results   EXAM: LEFT SHOULDER - 2+ VIEW   COMPARISON:  None   FINDINGS: Osseous demineralization.   AC joint alignment normal.   Contour abnormality of the anterolateral LEFT third rib question age-indeterminate fracture.   No glenohumeral fracture, dislocation, or bone destruction.   IMPRESSION: Question age-indeterminate fracture of the anterolateral LEFT third rib.   No acute shoulder abnormalities.     Electronically Signed   By: Lavonia Dana M.D.   On: 07/30/2021 20:39  I, Lynne Leader, personally (independently) visualized and performed the interpretation of the images attached in this note.      Assessment and Plan: 76 y.o. female with resolving left shoulder pain.  She still has a little bit of pain but has had significant improvement with subacromial injection and physical therapy.  At this point she has been released to home exercise program.  Continue home exercise program and  check back with me as needed.  Discussed precautions and indications for return.  Happy to reauthorize physical therapy in the future if needed.  Total encounter time 20 minutes including face-to-face time with the patient and, reviewing past medical record, and charting on the date of service.   Treatment plan and options.   Discussed warning signs or symptoms. Please see discharge instructions. Patient expresses understanding.   The above documentation has been reviewed and is accurate and complete Lynne Leader, M.D.

## 2021-09-24 NOTE — Progress Notes (Deleted)
   I, Peterson Lombard, LAT, ATC acting as a scribe for Lynne Leader, MD.  Heidi Shah is a 76 y.o. female who presents to [place] at [facility] today for f/u L shoulder pain. Pt was last seen by Dr. Georgina Snell on 07/30/21 and was given a L subacromial steroid injection and was referred to PT, completing 7 visits, and was d/c on 09/04/21. Pt requested additional imaging be done to r/o any potential rib fx and ribs/chest XR was ordered. Today, pt reports  Dx imaging: 08/06/21 L ribs w/ chest XR  07/30/21 L shoulder XR  Pertinent review of systems: ***  Relevant historical information: ***   Exam:  There were no vitals taken for this visit. General: Well Developed, well nourished, and in no acute distress.   MSK: ***    Lab and Radiology Results No results found for this or any previous visit (from the past 72 hour(s)). No results found.     Assessment and Plan: 76 y.o. female with ***   PDMP not reviewed this encounter. No orders of the defined types were placed in this encounter.  No orders of the defined types were placed in this encounter.    Discussed warning signs or symptoms. Please see discharge instructions. Patient expresses understanding.   ***

## 2021-09-25 ENCOUNTER — Ambulatory Visit: Payer: Medicare HMO | Admitting: Family Medicine

## 2021-09-25 VITALS — BP 122/84 | HR 96 | Ht 59.0 in | Wt 126.8 lb

## 2021-09-25 DIAGNOSIS — G8929 Other chronic pain: Secondary | ICD-10-CM

## 2021-09-25 DIAGNOSIS — M25512 Pain in left shoulder: Secondary | ICD-10-CM

## 2021-09-25 NOTE — Patient Instructions (Signed)
Thank you for coming in today.   Glad you shoulder is feeling better!  Continue to work on your home exercises  Recheck as needed

## 2021-10-21 ENCOUNTER — Encounter: Payer: Self-pay | Admitting: Family Medicine

## 2021-10-21 ENCOUNTER — Ambulatory Visit: Payer: Medicare HMO | Admitting: Family Medicine

## 2021-10-21 ENCOUNTER — Ambulatory Visit: Payer: Self-pay

## 2021-10-21 VITALS — BP 130/84 | HR 91 | Ht 59.0 in | Wt 126.2 lb

## 2021-10-21 DIAGNOSIS — G8929 Other chronic pain: Secondary | ICD-10-CM

## 2021-10-21 DIAGNOSIS — M25512 Pain in left shoulder: Secondary | ICD-10-CM | POA: Diagnosis not present

## 2021-10-21 MED ORDER — LORAZEPAM 0.5 MG PO TABS: ORAL_TABLET | ORAL | 0 refills | Status: DC

## 2021-10-21 NOTE — Addendum Note (Signed)
Addended by: Pollyann Glen on: 10/21/2021 01:28 PM   Modules accepted: Orders

## 2021-10-21 NOTE — Patient Instructions (Addendum)
Good to see you today.  I've prescribed you Ativan to take prior to your MRI.  I've also ordered an MRI of your L shoulder.  That facility will call you to schedule but please let us know if you don't hear from them in one week regarding scheduling.  Follow-up: after MRI

## 2021-10-21 NOTE — Progress Notes (Signed)
I, Wendy Poet, LAT, ATC, am serving as scribe for Dr. Lynne Leader.  Heidi Shah is a 76 y.o. female who presents to [place] at [facility] today for f/u of chronic L shoulder pain thought to be due to subacromial bursitis.  She was last seen by Dr. Georgina Snell on 09/25/21 and noted 90% improvement in her symptoms.  She had a prior L subacromial steroid injection on 07/30/21 and also completed a course of PT. Today, pt reports  that her L shoulder has not been doing well for the last 2 weeks.  She locates her pain to her L ant shoulder.  She has been taking IBU and has been intermittently doing her HEP per PT. she notes since she had her injection and midway through her physical therapy she had a incident where she partially fell and stabilized her fall with her arm.  She did have an increase in pain during that but it got a little better.  Diagnostic testing: Chest/rib XR- 08/06/21; L shoulder XR- 07/30/21  Pertinent review of systems: No fevers or chills  Relevant historical information: Hypertension.  Glaucoma.  Osteoporosis.   Exam:  BP 130/84 (BP Location: Left Arm, Patient Position: Sitting, Cuff Size: Normal)   Pulse 91   Ht '4\' 11"'$  (1.499 m)   Wt 126 lb 3.2 oz (57.2 kg)   SpO2 96%   BMI 25.49 kg/m  General: Well Developed, well nourished, and in no acute distress.   MSK: Left shoulder: Normal-appearing Normal motion pain with abduction. Strength abduction 4/5 external rotation internal rotation 5/5. Positive Hawkins and Neer's test. Negative Yergason's and speeds test.    Lab and Radiology Results  Diagnostic Limited MSK Ultrasound of: Left shoulder Biceps tendon intact however hypoechoic fluid does surround tendon within tendon sheath. Subscapularis tendon is intact with mild bursitis. Supraspinatus tendon with large hypoechoic structure proximal tendon consistent with partial tear in appearance. Infraspinatus tendon is intact AC joint degenerative  appearing Impression: Concern for supraspinatus rotator cuff tear.   EXAM: LEFT SHOULDER - 2+ VIEW   COMPARISON:  None   FINDINGS: Osseous demineralization.   AC joint alignment normal.   Contour abnormality of the anterolateral LEFT third rib question age-indeterminate fracture.   No glenohumeral fracture, dislocation, or bone destruction.   IMPRESSION: Question age-indeterminate fracture of the anterolateral LEFT third rib.   No acute shoulder abnormalities.     Electronically Signed   By: Lavonia Dana M.D.   On: 07/30/2021 20:39   Assessment and Plan: 76 y.o. female with left shoulder pain thought to be due to rotator cuff tear.  She is already had a good trial of physical therapy and prior steroid injection.  At this point plan for MRI to further characterize source of pain and for potential surgical planning.  If there is a clear surgical indication I can refer her directly to orthopedic surgery otherwise she will return to clinic. Of note she mentions significant MRI anxiety.  We will use lorazepam.  She will need a driver.   PDMP not reviewed this encounter. Orders Placed This Encounter  Procedures   [place] LIMITED JOINT SPACE STRUCTURES UP LEFT(NO LINKED CHARGES)    Order Specific Question:   Reason for Exam (SYMPTOM  OR DIAGNOSIS REQUIRED)    Answer:   L shoulder pain    Order Specific Question:   Preferred imaging location?    Answer:   [place] [facility]   MR SHOULDER LEFT WO CONTRAST  Standing Status:   Future    Standing Expiration Date:   10/22/2022    Order Specific Question:   What is the patient's sedation requirement?    Answer:   No Sedation    Order Specific Question:   Does the patient have a pacemaker or implanted devices?    Answer:   No    Order Specific Question:   Preferred imaging location?    Answer:   [facility] (table limit-350lbs)   Meds ordered this encounter  Medications   LORazepam (ATIVAN) 0.5 MG  tablet    Sig: 1-2 tabs 30 - 60 min prior to MRI. Do not drive with this medicine.    Dispense:  4 tablet    Refill:  0     Discussed warning signs or symptoms. Please see discharge instructions. Patient expresses understanding.   The above documentation has been reviewed and is accurate and complete Lynne Leader, M.D.

## 2021-11-04 ENCOUNTER — Ambulatory Visit: Payer: Medicare HMO | Admitting: Family Medicine

## 2021-11-05 ENCOUNTER — Other Ambulatory Visit: Payer: Medicare HMO

## 2021-11-06 ENCOUNTER — Ambulatory Visit
Admission: RE | Admit: 2021-11-06 | Discharge: 2021-11-06 | Disposition: A | Discharge status: Home / Self Care | Payer: Medicare HMO | Source: Ambulatory Visit | Attending: Family Medicine | Admitting: Family Medicine

## 2021-11-06 DIAGNOSIS — G8929 Other chronic pain: Secondary | ICD-10-CM

## 2021-11-06 DIAGNOSIS — M75122 Complete rotator cuff tear or rupture of left shoulder, not specified as traumatic: Secondary | ICD-10-CM | POA: Diagnosis not present

## 2021-11-06 DIAGNOSIS — M25412 Effusion, left shoulder: Secondary | ICD-10-CM | POA: Diagnosis not present

## 2021-11-09 ENCOUNTER — Telehealth: Payer: Self-pay | Admitting: Family Medicine

## 2021-11-09 DIAGNOSIS — M75111 Incomplete rotator cuff tear or rupture of right shoulder, not specified as traumatic: Secondary | ICD-10-CM

## 2021-11-09 NOTE — Telephone Encounter (Signed)
Orthopedic surgery referral placed today. 

## 2021-11-09 NOTE — Progress Notes (Signed)
MRI shows a almost complete rotator cuff tear with significant retraction.  This is very unlikely to get better without surgery.  I have referred you to a good orthopedic surgeon who does a lot of shoulder surgeries (Dr Sammuel Hines).  Additionally rotator cuff tendinitis as well.  You should hear from his office soon.

## 2021-11-18 ENCOUNTER — Ambulatory Visit (INDEPENDENT_AMBULATORY_CARE_PROVIDER_SITE_OTHER): Payer: Medicare HMO | Admitting: Orthopaedic Surgery

## 2021-11-18 ENCOUNTER — Other Ambulatory Visit (HOSPITAL_BASED_OUTPATIENT_CLINIC_OR_DEPARTMENT_OTHER): Payer: Self-pay

## 2021-11-18 ENCOUNTER — Ambulatory Visit (HOSPITAL_BASED_OUTPATIENT_CLINIC_OR_DEPARTMENT_OTHER): Payer: Self-pay | Admitting: Orthopaedic Surgery

## 2021-11-18 DIAGNOSIS — S46012A Strain of muscle(s) and tendon(s) of the rotator cuff of left shoulder, initial encounter: Secondary | ICD-10-CM

## 2021-11-18 MED ORDER — ASPIRIN 325 MG PO TBEC
325.0000 mg | DELAYED_RELEASE_TABLET | Freq: Every day | ORAL | 0 refills | Status: DC
  Filled 2021-11-18: qty 30, 30d supply, fill #0

## 2021-11-18 MED ORDER — IBUPROFEN 800 MG PO TABS
800.0000 mg | ORAL_TABLET | Freq: Three times a day (TID) | ORAL | 0 refills | Status: AC
  Filled 2021-11-18: qty 30, 10d supply, fill #0

## 2021-11-18 MED ORDER — ACETAMINOPHEN 500 MG PO TABS
500.0000 mg | ORAL_TABLET | Freq: Three times a day (TID) | ORAL | 0 refills | Status: AC
  Filled 2021-11-18: qty 30, 10d supply, fill #0

## 2021-11-18 MED ORDER — OXYCODONE HCL 5 MG PO TABS
5.0000 mg | ORAL_TABLET | ORAL | 0 refills | Status: DC | PRN
  Filled 2021-11-18: qty 20, 4d supply, fill #0

## 2021-11-18 NOTE — H&P (View-Only) (Signed)
Chief Complaint: Left shoulder pain     History of Present Illness:    Heidi Shah is a 76 y.o. female right-hand-dominant female presents with left shoulder pain since March 2023 when she felt a pop with a quick motion turning to catch a child.  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  She had an injection in May 2023 and did get some temporary relief.  She has been working with physical therapy for strengthening of the shoulder without any improvement.  She does have a history of being told that she has bursitis in the left shoulder without any surgery.  She has difficulty laying directly on the side.  She has been seen by Dr. Georgina Snell and referred here today as she continues to have pain and limited strength with overhead motion.    Surgical History:   None  PMH/PSH/Family History/Social History/Meds/Allergies:    Past Medical History:  Diagnosis Date   Allergy    Anxiety    Cataract    surgery - Bilateral cataracts removed   Chicken pox    Colon polyps    Depression    Diverticulitis    Diverticulosis    Esophageal stricture    GERD (gastroesophageal reflux disease)    Heart murmur    Hypertension    Past Surgical History:  Procedure Laterality Date   cataract surgery Bilateral 1996   COLONOSCOPY     EYE SURGERY  2000   detached L retina   HERNIA REPAIR  1990   NECK SURGERY     as a child   POLYPECTOMY     UPPER GASTROINTESTINAL ENDOSCOPY     Social History   Socioeconomic History   Marital status: Divorced    Spouse name: Not on file   Number of children: 2   Years of education: master's degree   Highest education level: Master's degree (e.g., MA, MS, MEng, MEd, MSW, MBA)  Occupational History   Occupation: retired Pharmacist, hospital    Comment: part - time Pharmacist, hospital  Tobacco Use   Smoking status: Never   Smokeless tobacco: Never  Vaping Use   Vaping Use: Never used  Substance and Sexual Activity   Alcohol use: Yes     Alcohol/week: 1.0 standard drink of alcohol    Types: 1 Glasses of wine per week    Comment: one or two glasses of wine a couple times a week   Drug use: No   Sexual activity: Not on file  Other Topics Concern   Not on file  Social History Narrative      Lives alone in ranch home.    2 children   Son lives in [place]   Continues to teach virtually part time   Social Determinants of Health   Financial Resource Strain: Low Risk  (05/19/2021)   Overall Financial Resource Strain (CARDIA)    Difficulty of Paying Living Expenses: Not hard at all  Food Insecurity: No Food Insecurity (05/19/2021)   Hunger Vital Sign    Worried About Running Out of Food in the Last Year: Never true    Ran Out of Food in the Last Year: Never true  Transportation Needs: No Transportation Needs (05/19/2021)   PRAPARE - Transportation    Lack of Transportation (Medical): No    Lack of  Transportation (Non-Medical): No  Physical Activity: Sufficiently Active (05/19/2021)   Exercise Vital Sign    Days of Exercise per Week: 3 days    Minutes of Exercise per Session: 60 min  Stress: No Stress Concern Present (05/19/2021)   [place]    Feeling of Stress : Only a little  Social Connections: Moderately Isolated (05/19/2021)   Social Connection and Isolation Panel [NHANES]    Frequency of Communication with Friends and Family: Once a week    Frequency of Social Gatherings with Friends and Family: Once a week    Attends Religious Services: More than 4 times per year    Active Member of [employer] or Organizations: Yes    Attends Music therapist: More than 4 times per year    Marital Status: Divorced   Family History  Problem Relation Age of Onset   Heart disease Mother 2   COPD Mother    Crohn's disease Mother    Heart disease Father 34   Cancer Father        kidney   Colon cancer Neg Hx    Esophageal cancer Neg Hx    Stomach  cancer Neg Hx    Rectal cancer Neg Hx    Allergies  Allergen Reactions   Beeswax     Per skin test from dermatologist   Cetrimonium Chloride [Cetrimide]     Per skin test from dermatologist   Methylisothiazolinone     Per skin test from dermatologist   Neomycin Sulfate [Neomycin]     Per skin test from dermatologist   Nickel Sulfate [Nickel]     Per skin test from dermatologist   Penicillins    Propolis     Per skin test from dermatologist   Current Outpatient Medications  Medication Sig Dispense Refill   Cholecalciferol (D3 ADULT PO) Take by mouth. daily     lisinopril-hydrochlorothiazide (ZESTORETIC) 20-12.5 MG tablet TAKE ONE TABLET BY MOUTH ONE TIME DAILY 90 tablet 1   LORazepam (ATIVAN) 0.5 MG tablet 1-2 tabs 30 - 60 min prior to MRI. Do not drive with this medicine. 4 tablet 0   Multiple Vitamin (MULTI-VITAMIN) tablet Take by mouth.     omeprazole (PRILOSEC) 40 MG capsule TAKE ONE CAPSULE BY MOUTH ONE TIME DAILY 30 capsule 6   vitamin C (ASCORBIC ACID) 500 MG tablet Take 500 mg by mouth daily.     VYZULTA 0.024 % SOLN      No current facility-administered medications for this visit.   No results found.  Review of Systems:   A ROS was performed including pertinent positives and negatives as documented in the HPI.  Physical Exam :   Constitutional: NAD and appears stated age Neurological: Alert and oriented Psych: Appropriate affect and cooperative There were no vitals taken for this visit.   Comprehensive Musculoskeletal Exam:    Musculoskeletal Exam    Inspection Right Left  Skin No atrophy or winging No atrophy or winging  Palpation    Tenderness None Biceps, lateral deltoid  Range of Motion    Flexion (passive) 170 170  Flexion (active) 170 150  Abduction 170 140  ER at the side 70 45  Can reach behind back to T12 L3  Strength     Full 4 out of 5 supra, negative belly press  Special Tests    Pseudoparalytic No No  Neurologic    Fires PIN, radial,  median, ulnar, musculocutaneous, axillary, suprascapular, long  thoracic, and spinal accessory innervated muscles. No abnormal sensibility  Vascular/Lymphatic    Radial Pulse 2+ 2+  Cervical Exam    Patient has symmetric cervical range of motion with negative Spurling's test.  Special Test: Positive speeds test on the left     Imaging:   Xray (left shoulder 3 views): Normal  MRI (left shoulder): Full-thickness tear of the supraspinatus tendon with minimal retraction and well-appearing muscle belly.  There is fluid around the biceps tendon  I personally reviewed and interpreted the radiographs.   Assessment:   76 y.o. female right-hand-dominant with a left shoulder acute rotator cuff tear after an injury in March of this year.  She has been working in physical therapy now without any significant relief.  She has had an injection in May of this year with only temporary relief.  She is here today for further assessment.  Overall given the fact that she does have continued overhead weakness and pain I do believe she would be a candidate for arthroscopic rotator cuff repair.  I did discuss ongoing conservative management although at this time she is failed the mainstays of this.  To that effect we did discuss surgical treatment in detail.  We did discuss the rehab associated with rotator cuff repair.  I did discuss the complications specific to this.  She would like to proceed with arthroscopic rotator cuff repair after further discussion  Plan :    -Plan for left shoulder arthroscopic rotator cuff repair with biceps tenodesis   After a lengthy discussion of treatment options, including risks, benefits, alternatives, complications of surgical and nonsurgical conservative options, the patient elected surgical repair.   The patient  is aware of the material risks  and complications including, but not limited to injury to adjacent structures, neurovascular injury, infection, numbness, bleeding,  implant failure, thermal burns, stiffness, persistent pain, failure to heal, disease transmission from allograft, need for further surgery, dislocation, anesthetic risks, blood clots, risks of death,and others. The probabilities of surgical success and failure discussed with patient given their particular co-morbidities.The time and nature of expected rehabilitation and recovery was discussed.The patient's questions were all answered preoperatively.  No barriers to understanding were noted. I explained the natural history of the disease process and Rx rationale.  I explained to the patient what I considered to be reasonable expectations given their personal situation.  The final treatment plan was arrived at through a shared patient decision making process model.      I personally saw and evaluated the patient, and participated in the management and treatment plan.  Vanetta Mulders, MD Attending Physician, Orthopedic Surgery  This document was dictated using Dragon voice recognition software. A reasonable attempt at proof reading has been made to minimize errors.

## 2021-11-18 NOTE — Progress Notes (Signed)
Chief Complaint: Left shoulder pain     History of Present Illness:    Heidi Shah is a 76 y.o. female right-hand-dominant female presents with left shoulder pain since March 2023 when she felt a pop with a quick motion turning to catch a child.  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  She had an injection in May 2023 and did get some temporary relief.  She has been working with physical therapy for strengthening of the shoulder without any improvement.  She does have a history of being told that she has bursitis in the left shoulder without any surgery.  She has difficulty laying directly on the side.  She has been seen by Dr. Georgina Snell and referred here today as she continues to have pain and limited strength with overhead motion.    Surgical History:   None  PMH/PSH/Family History/Social History/Meds/Allergies:    Past Medical History:  Diagnosis Date   Allergy    Anxiety    Cataract    surgery - Bilateral cataracts removed   Chicken pox    Colon polyps    Depression    Diverticulitis    Diverticulosis    Esophageal stricture    GERD (gastroesophageal reflux disease)    Heart murmur    Hypertension    Past Surgical History:  Procedure Laterality Date   cataract surgery Bilateral 1996   COLONOSCOPY     EYE SURGERY  2000   detached L retina   HERNIA REPAIR  1990   NECK SURGERY     as a child   POLYPECTOMY     UPPER GASTROINTESTINAL ENDOSCOPY     Social History   Socioeconomic History   Marital status: Divorced    Spouse name: Not on file   Number of children: 2   Years of education: master's degree   Highest education level: Master's degree (e.g., MA, MS, MEng, MEd, MSW, MBA)  Occupational History   Occupation: retired Pharmacist, hospital    Comment: part - time Pharmacist, hospital  Tobacco Use   Smoking status: Never   Smokeless tobacco: Never  Vaping Use   Vaping Use: Never used  Substance and Sexual Activity   Alcohol use: Yes     Alcohol/week: 1.0 standard drink of alcohol    Types: 1 Glasses of wine per week    Comment: one or two glasses of wine a couple times a week   Drug use: No   Sexual activity: Not on file  Other Topics Concern   Not on file  Social History Narrative      Lives alone in ranch home.    2 children   Son lives in [place]   Continues to teach virtually part time   Social Determinants of Health   Financial Resource Strain: Low Risk  (05/19/2021)   Overall Financial Resource Strain (CARDIA)    Difficulty of Paying Living Expenses: Not hard at all  Food Insecurity: No Food Insecurity (05/19/2021)   Hunger Vital Sign    Worried About Running Out of Food in the Last Year: Never true    Ran Out of Food in the Last Year: Never true  Transportation Needs: No Transportation Needs (05/19/2021)   PRAPARE - Transportation    Lack of Transportation (Medical): No    Lack of  Transportation (Non-Medical): No  Physical Activity: Sufficiently Active (05/19/2021)   Exercise Vital Sign    Days of Exercise per Week: 3 days    Minutes of Exercise per Session: 60 min  Stress: No Stress Concern Present (05/19/2021)   [name]    Feeling of Stress : Only a little  Social Connections: Moderately Isolated (05/19/2021)   Social Connection and Isolation Panel [NHANES]    Frequency of Communication with Friends and Family: Once a week    Frequency of Social Gatherings with Friends and Family: Once a week    Attends Religious Services: More than 4 times per year    Active Member of [employer] or Organizations: Yes    Attends Music therapist: More than 4 times per year    Marital Status: Divorced   Family History  Problem Relation Age of Onset   Heart disease Mother 57   COPD Mother    Crohn's disease Mother    Heart disease Father 4   Cancer Father        kidney   Colon cancer Neg Hx    Esophageal cancer Neg Hx    Stomach  cancer Neg Hx    Rectal cancer Neg Hx    Allergies  Allergen Reactions   Beeswax     Per skin test from dermatologist   Cetrimonium Chloride [Cetrimide]     Per skin test from dermatologist   Methylisothiazolinone     Per skin test from dermatologist   Neomycin Sulfate [Neomycin]     Per skin test from dermatologist   Nickel Sulfate [Nickel]     Per skin test from dermatologist   Penicillins    Propolis     Per skin test from dermatologist   Current Outpatient Medications  Medication Sig Dispense Refill   Cholecalciferol (D3 ADULT PO) Take by mouth. daily     lisinopril-hydrochlorothiazide (ZESTORETIC) 20-12.5 MG tablet TAKE ONE TABLET BY MOUTH ONE TIME DAILY 90 tablet 1   LORazepam (ATIVAN) 0.5 MG tablet 1-2 tabs 30 - 60 min prior to MRI. Do not drive with this medicine. 4 tablet 0   Multiple Vitamin (MULTI-VITAMIN) tablet Take by mouth.     omeprazole (PRILOSEC) 40 MG capsule TAKE ONE CAPSULE BY MOUTH ONE TIME DAILY 30 capsule 6   vitamin C (ASCORBIC ACID) 500 MG tablet Take 500 mg by mouth daily.     VYZULTA 0.024 % SOLN      No current facility-administered medications for this visit.   No results found.  Review of Systems:   A ROS was performed including pertinent positives and negatives as documented in the HPI.  Physical Exam :   Constitutional: NAD and appears stated age Neurological: Alert and oriented Psych: Appropriate affect and cooperative There were no vitals taken for this visit.   Comprehensive Musculoskeletal Exam:    Musculoskeletal Exam    Inspection Right Left  Skin No atrophy or winging No atrophy or winging  Palpation    Tenderness None Biceps, lateral deltoid  Range of Motion    Flexion (passive) 170 170  Flexion (active) 170 150  Abduction 170 140  ER at the side 70 45  Can reach behind back to T12 L3  Strength     Full 4 out of 5 supra, negative belly press  Special Tests    Pseudoparalytic No No  Neurologic    Fires PIN, radial,  median, ulnar, musculocutaneous, axillary, suprascapular, long  thoracic, and spinal accessory innervated muscles. No abnormal sensibility  Vascular/Lymphatic    Radial Pulse 2+ 2+  Cervical Exam    Patient has symmetric cervical range of motion with negative Spurling's test.  Special Test: Positive speeds test on the left     Imaging:   Xray (left shoulder 3 views): Normal  MRI (left shoulder): Full-thickness tear of the supraspinatus tendon with minimal retraction and well-appearing muscle belly.  There is fluid around the biceps tendon  I personally reviewed and interpreted the radiographs.   Assessment:   76 y.o. female right-hand-dominant with a left shoulder acute rotator cuff tear after an injury in March of this year.  She has been working in physical therapy now without any significant relief.  She has had an injection in May of this year with only temporary relief.  She is here today for further assessment.  Overall given the fact that she does have continued overhead weakness and pain I do believe she would be a candidate for arthroscopic rotator cuff repair.  I did discuss ongoing conservative management although at this time she is failed the mainstays of this.  To that effect we did discuss surgical treatment in detail.  We did discuss the rehab associated with rotator cuff repair.  I did discuss the complications specific to this.  She would like to proceed with arthroscopic rotator cuff repair after further discussion  Plan :    -Plan for left shoulder arthroscopic rotator cuff repair with biceps tenodesis   After a lengthy discussion of treatment options, including risks, benefits, alternatives, complications of surgical and nonsurgical conservative options, the patient elected surgical repair.   The patient  is aware of the material risks  and complications including, but not limited to injury to adjacent structures, neurovascular injury, infection, numbness, bleeding,  implant failure, thermal burns, stiffness, persistent pain, failure to heal, disease transmission from allograft, need for further surgery, dislocation, anesthetic risks, blood clots, risks of death,and others. The probabilities of surgical success and failure discussed with patient given their particular co-morbidities.The time and nature of expected rehabilitation and recovery was discussed.The patient's questions were all answered preoperatively.  No barriers to understanding were noted. I explained the natural history of the disease process and Rx rationale.  I explained to the patient what I considered to be reasonable expectations given their personal situation.  The final treatment plan was arrived at through a shared patient decision making process model.      I personally saw and evaluated the patient, and participated in the management and treatment plan.  Vanetta Mulders, MD Attending Physician, Orthopedic Surgery  This document was dictated using Dragon voice recognition software. A reasonable attempt at proof reading has been made to minimize errors.

## 2021-11-19 ENCOUNTER — Encounter (HOSPITAL_BASED_OUTPATIENT_CLINIC_OR_DEPARTMENT_OTHER): Payer: Self-pay | Admitting: Physical Therapy

## 2021-11-23 ENCOUNTER — Ambulatory Visit (INDEPENDENT_AMBULATORY_CARE_PROVIDER_SITE_OTHER): Payer: Medicare HMO | Admitting: Family Medicine

## 2021-11-23 ENCOUNTER — Encounter: Payer: Self-pay | Admitting: Family Medicine

## 2021-11-23 VITALS — BP 120/70 | HR 97 | Temp 98.3°F | Ht 59.0 in | Wt 127.0 lb

## 2021-11-23 DIAGNOSIS — L259 Unspecified contact dermatitis, unspecified cause: Secondary | ICD-10-CM | POA: Diagnosis not present

## 2021-11-23 DIAGNOSIS — Z01818 Encounter for other preprocedural examination: Secondary | ICD-10-CM | POA: Diagnosis not present

## 2021-11-23 MED ORDER — TRIAMCINOLONE ACETONIDE 0.1 % EX CREA: 1.0000 | TOPICAL_CREAM | Freq: Two times a day (BID) | CUTANEOUS | 0 refills | Status: DC | PRN

## 2021-11-23 NOTE — Patient Instructions (Signed)
HOLD aspirin for at least one week before surgery.

## 2021-11-23 NOTE — Progress Notes (Signed)
Established Patient Office Visit  Subjective   Patient ID: Heidi Shah, female    DOB: 1945/08/09  Age: 76 y.o. MRN: [id number]  Chief Complaint  Patient presents with   Pre-op Exam    HPI   Here for preoperative evaluation.  She has history of hypertension, GERD, cervical spondylosis, osteoporosis.  She has been having some left shoulder difficulties.  She had shoulder injection with steroid and had some physical therapy.  Then she was at her son's house and stepping down from 1 level to another and lost her balance and fell against a support structure.  She thinks she may have torn her left rotator cuff then.  She is being scheduled for surgery sometime in September.  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  No recent chest pains.  No dyspnea.  She has no chronic heart or lung problems.  Her blood pressures been very well controlled.  She had EKG last year which was unremarkable.  She does have 2/6 systolic murmur over aortic valve but had echo March 2022 which showed no significant valve issues or any regional wall motion abnormalities.  She has another issue of pruritic skin rash left side of neck.  Present for several days.  No new jewelry.  She is using a rental car currently and has noted that the seatbelt comes up close to this area.  Past Medical History:  Diagnosis Date   Allergy    Anxiety    Cataract    surgery - Bilateral cataracts removed   Chicken pox    Colon polyps    Depression    Diverticulitis    Diverticulosis    Esophageal stricture    GERD (gastroesophageal reflux disease)    Heart murmur    Hypertension    Past Surgical History:  Procedure Laterality Date   cataract surgery Bilateral 1996   COLONOSCOPY     EYE SURGERY  2000   detached L retina   HERNIA REPAIR  1990   NECK SURGERY     as a child   POLYPECTOMY     UPPER GASTROINTESTINAL ENDOSCOPY      reports that she has never smoked. She has never used smokeless tobacco.  She reports current alcohol use of about 1.0 standard drink of alcohol per week. She reports that she does not use drugs. family history includes COPD in her mother; Cancer in her father; Crohn's disease in her mother; Heart disease (age of onset: 66) in her mother; Heart disease (age of onset: 80) in her father. Allergies  Allergen Reactions   Beeswax     Per skin test from dermatologist   Cetrimonium Chloride [Cetrimide]     Per skin test from dermatologist   Methylisothiazolinone     Per skin test from dermatologist   Neomycin Sulfate [Neomycin]     Per skin test from dermatologist   Nickel Sulfate [Nickel]     Per skin test from dermatologist   Penicillins    Propolis     Per skin test from dermatologist    Review of Systems  Constitutional:  Negative for malaise/fatigue.  Eyes:  Negative for blurred vision.  Respiratory:  Negative for shortness of breath.   Cardiovascular:  Negative for chest pain.  Skin:  Positive for rash.  Neurological:  Negative for dizziness, weakness and headaches.      Objective:     BP 120/70 (BP Location: Right Arm, Patient Position: Sitting, Cuff Size: Normal)  Pulse 97   Temp 98.3 F (36.8 C) (Oral)   Ht '4\' 11"'$  (1.499 m)   Wt 127 lb (57.6 kg)   SpO2 97%   BMI 25.65 kg/m    Physical Exam Constitutional:      Appearance: She is well-developed.  Eyes:     Pupils: Pupils are equal, round, and reactive to light.  Neck:     Thyroid: No thyromegaly.     Vascular: No JVD.  Cardiovascular:     Rate and Rhythm: Normal rate and regular rhythm.     Heart sounds:     No gallop.  Pulmonary:     Effort: Pulmonary effort is normal. No respiratory distress.     Breath sounds: Normal breath sounds. No wheezing or rales.  Musculoskeletal:     Cervical back: Neck supple.  Skin:    Comments: Has erythematous patch left side of neck approximately 2 x 4 cm.  Erythematous and slightly raised.  No pustules.  Nonscaly.  Neurological:     Mental  Status: She is alert.      No results found for any visits on 11/23/21.    The 10-year ASCVD risk score (Arnett DK, et al., 2019) is: 15.5%    Assessment & Plan:   #1 preoperative clearance.  No known contraindications for surgery.  No cardiac history.  No chronic lung issues.  Recent echo and EKG as above. -Form completed -She will check with surgeon regarding whether they require any preoperative labs -She has taken occasional aspirin in the past and is instructed to leave this off completely especially 7 days minimum prior to surgery  #2 pruritic rash left side of neck.  Looks more like contact dermatitis type rash.  Try to avoid systemic steroids with anticipated surgery soon.  Topical triamcinolone 0.1% cream to use twice daily as needed  No follow-ups on file.    Carolann Littler, MD

## 2021-12-03 ENCOUNTER — Encounter (HOSPITAL_BASED_OUTPATIENT_CLINIC_OR_DEPARTMENT_OTHER): Payer: Self-pay | Admitting: Orthopaedic Surgery

## 2021-12-03 ENCOUNTER — Telehealth: Payer: Self-pay | Admitting: Orthopaedic Surgery

## 2021-12-03 NOTE — Pre-Procedure Instructions (Signed)
Surgical Instructions    Your procedure is scheduled on December 10, 2021.  Report to [facility] Main Entrance "A" at 10:15 A.M., then check in with the Admitting office.  Call this number if you have problems the morning of surgery:  [phone]   If you have any questions prior to your surgery date call [phone]: Open Monday-Friday 8am-4pm    Remember:  Do not eat after midnight the night before your surgery  You may drink clear liquids until 9:15 AM the morning of your surgery.   Clear liquids allowed are: Water, Non-Citrus Juices (without pulp), Carbonated Beverages, Clear Tea, Black Coffee Only (NO MILK, CREAM OR POWDERED CREAMER of any kind), and Gatorade.  Patient Instructions  The night before surgery:  No food after midnight. ONLY clear liquids after midnight  The day of surgery (if you do NOT have diabetes):  Drink ONE (1) Pre-Surgery Clear Ensure by 9:15 AM the morning of surgery. Drink in one sitting. Do not sip.  This drink was given to you during your hospital  pre-op appointment visit.  Nothing else to drink after completing the  Pre-Surgery Clear Ensure.         If you have questions, please contact your surgeon's office.      Take these medicines the morning of surgery with A SIP OF WATER:  omeprazole (PRILOSEC)  oxyCODONE (OXY IR/ROXICODONE) - may take as needed   As of today, STOP taking any Aspirin (unless otherwise instructed by your surgeon) Aleve, Naproxen, Ibuprofen, Motrin, Advil, Goody's, BC's, all herbal medications, fish oil, and all vitamins.                     Do NOT Smoke (Tobacco/Vaping) for 24 hours prior to your procedure.  If you use a CPAP at night, you may bring your mask/headgear for your overnight stay.   Contacts, glasses, piercing's, hearing aid's, dentures or partials may not be worn into surgery, please bring cases for these belongings.    For patients admitted to the hospital, discharge time will be determined by your  treatment team.   Patients discharged the day of surgery will not be allowed to drive home, and someone needs to stay with them for 24 hours.  SURGICAL WAITING ROOM VISITATION Patients having surgery or a procedure may have no more than 2 support people in the waiting area - these visitors may rotate.   Children under the age of 79 must have an adult with them who is not the patient. If the patient needs to stay at the hospital during part of their recovery, the visitor guidelines for inpatient rooms apply. Pre-op nurse will coordinate an appropriate time for 1 support person to accompany patient in pre-op.  This support person may not rotate.   Please refer to the [facility] website for the visitor guidelines for Inpatients (after your surgery is over and you are in a regular room).    Special instructions:   [place]- Preparing For Surgery  Before surgery, you can play an important role. Because skin is not sterile, your skin needs to be as free of germs as possible. You can reduce the number of germs on your skin by washing with CHG (chlorahexidine gluconate) Soap before surgery.  CHG is an antiseptic cleaner which kills germs and bonds with the skin to continue killing germs even after washing.    Oral Hygiene is also important to reduce your risk of infection.  Remember - BRUSH YOUR TEETH  THE MORNING OF SURGERY WITH YOUR REGULAR TOOTHPASTE  Please do not use if you have an allergy to CHG or antibacterial soaps. If your skin becomes reddened/irritated stop using the CHG.  Do not shave (including legs and underarms) for at least 48 hours prior to first CHG shower. It is OK to shave your face.  Please follow these instructions carefully.   Shower the NIGHT BEFORE SURGERY and the MORNING OF SURGERY  If you chose to wash your hair, wash your hair first as usual with your normal shampoo.  After you shampoo, rinse your hair and body thoroughly to remove the shampoo.  Use CHG Soap as  you would any other liquid soap. You can apply CHG directly to the skin and wash gently with a scrungie or a clean washcloth.   Apply the CHG Soap to your body ONLY FROM THE NECK DOWN.  Do not use on open wounds or open sores. Avoid contact with your eyes, ears, mouth and genitals (private parts). Wash Face and genitals (private parts)  with your normal soap.   Wash thoroughly, paying special attention to the area where your surgery will be performed.  Thoroughly rinse your body with warm water from the neck down.  DO NOT shower/wash with your normal soap after using and rinsing off the CHG Soap.  Pat yourself dry with a CLEAN TOWEL.  Wear CLEAN PAJAMAS to bed the night before surgery  Place CLEAN SHEETS on your bed the night before your surgery  DO NOT SLEEP WITH PETS.   Day of Surgery: Take a shower with CHG soap. Do not wear jewelry or makeup Do not wear lotions, powders, perfumes, or deodorant. Do not shave 48 hours prior to surgery.   Do not bring valuables to the hospital.  [facility] is not responsible for any belongings or valuables. Do not wear nail polish, gel polish, artificial nails, or any other type of covering on natural nails (fingers and toes) If you have artificial nails or gel coating that need to be removed by a nail salon, please have this removed prior to surgery. Artificial nails or gel coating may interfere with anesthesia's ability to adequately monitor your vital signs.  Wear Clean/Comfortable clothing the morning of surgery Remember to brush your teeth WITH YOUR REGULAR TOOTHPASTE.   Please read over the following fact sheets that you were given.    If you received a COVID test during your pre-op visit  it is requested that you wear a mask when out in public, stay away from anyone that may not be feeling well and notify your surgeon if you develop symptoms. If you have been in contact with anyone that has tested positive in the last 10 days please  notify you surgeon.

## 2021-12-03 NOTE — Telephone Encounter (Signed)
Patient called asked if she can get a note to be out before surgery. 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 Patient said she will pick up note. The number to contact patient is [phone]

## 2021-12-03 NOTE — Telephone Encounter (Signed)
Letter is printed and in front desk drawer. Pt has been called and will be in tomorrow morning (12/04/21) for pickup. Nothing further needed.

## 2021-12-04 ENCOUNTER — Other Ambulatory Visit: Payer: Self-pay

## 2021-12-04 ENCOUNTER — Encounter (HOSPITAL_COMMUNITY): Payer: Self-pay

## 2021-12-04 ENCOUNTER — Encounter (HOSPITAL_COMMUNITY)
Admission: RE | Admit: 2021-12-04 | Discharge: 2021-12-04 | Disposition: A | Discharge status: Home / Self Care | Payer: Medicare HMO | Source: Ambulatory Visit | Attending: Orthopaedic Surgery | Admitting: Orthopaedic Surgery

## 2021-12-04 VITALS — BP 161/89 | HR 78 | Temp 98.1°F | Resp 17 | Ht 60.0 in | Wt 125.8 lb

## 2021-12-04 DIAGNOSIS — Z01818 Encounter for other preprocedural examination: Secondary | ICD-10-CM | POA: Diagnosis not present

## 2021-12-04 DIAGNOSIS — I2510 Atherosclerotic heart disease of native coronary artery without angina pectoris: Secondary | ICD-10-CM

## 2021-12-04 HISTORY — DX: Family history of other specified conditions: Z84.89

## 2021-12-04 HISTORY — DX: Unspecified osteoarthritis, unspecified site: M19.90

## 2021-12-04 LAB — BASIC METABOLIC PANEL
Anion gap: 8 (ref 5–15)
BUN: 16 mg/dL (ref 8–23)
CO2: 29 mmol/L (ref 22–32)
Calcium: 9.5 mg/dL (ref 8.9–10.3)
Chloride: 102 mmol/L (ref 98–111)
Creatinine, Ser: 0.65 mg/dL (ref 0.44–1.00)
GFR, Estimated: >60 mL/min (ref >60)
Glucose, Bld: 100 mg/dL — ABNORMAL HIGH (ref 70–99)
Potassium: 3.9 mmol/L (ref 3.5–5.1)
Sodium: 139 mmol/L (ref 135–145)

## 2021-12-04 LAB — CBC
HCT: 40.5 % (ref 36.0–46.0)
Hemoglobin: 13.5 g/dL (ref 12.0–15.0)
MCH: 31 pg (ref 26.0–34.0)
MCHC: 33.3 g/dL (ref 30.0–36.0)
MCV: 93.1 fL (ref 80.0–100.0)
Platelets: 278 10*3/uL (ref 150–400)
RBC: 4.35 MIL/uL (ref 3.87–5.11)
RDW: 12 % (ref 11.5–15.5)
WBC: 4.3 10*3/uL (ref 4.0–10.5)
nRBC: 0 % (ref 0.0–0.2)

## 2021-12-04 LAB — SURGICAL PCR SCREEN
MRSA, PCR: NEGATIVE
Staphylococcus aureus: NEGATIVE

## 2021-12-04 NOTE — Progress Notes (Signed)
PCP - Dr. Carolann Littler Cardiologist - Denies  PPM/ICD - Denies Device Orders - n/a Rep Notified - n/a  Chest x-ray - 05/26/2020 EKG - 12/04/2021 Stress Test - Denies  ECHO - 06/20/2020 Cardiac Cath - Denies  Sleep Study - Denies CPAP - n/a  No DM  Blood Thinner Instructions: n/a Aspirin Instructions: n/a  ERAS Protcol - Yes. Clear liquids until 0915 morning of surgery PRE-SURGERY Ensure or G2- Ensure given to patient at PAT appointment  COVID TEST- n/a   Anesthesia review: Yes. Abnormal EKG.  Patient denies shortness of breath, fever, cough and chest pain at PAT appointment   All instructions explained to the patient, with a verbal understanding of the material. Patient agrees to go over the instructions while at home for a better understanding. Patient also instructed to self quarantine after being tested for COVID-19. The opportunity to ask questions was provided.

## 2021-12-07 DIAGNOSIS — L821 Other seborrheic keratosis: Secondary | ICD-10-CM | POA: Diagnosis not present

## 2021-12-07 DIAGNOSIS — D225 Melanocytic nevi of trunk: Secondary | ICD-10-CM | POA: Diagnosis not present

## 2021-12-07 DIAGNOSIS — L814 Other melanin hyperpigmentation: Secondary | ICD-10-CM | POA: Diagnosis not present

## 2021-12-10 ENCOUNTER — Ambulatory Visit (HOSPITAL_COMMUNITY): Payer: Medicare HMO | Admitting: Physician Assistant

## 2021-12-10 ENCOUNTER — Ambulatory Visit (HOSPITAL_BASED_OUTPATIENT_CLINIC_OR_DEPARTMENT_OTHER): Payer: Medicare HMO | Admitting: Anesthesiology

## 2021-12-10 ENCOUNTER — Encounter (HOSPITAL_COMMUNITY): Payer: Self-pay | Admitting: Orthopaedic Surgery

## 2021-12-10 ENCOUNTER — Ambulatory Visit (HOSPITAL_COMMUNITY)
Admission: RE | Admit: 2021-12-10 | Discharge: 2021-12-10 | Disposition: A | Discharge status: Home / Self Care | Payer: Medicare HMO | Attending: Orthopaedic Surgery | Admitting: Orthopaedic Surgery

## 2021-12-10 ENCOUNTER — Other Ambulatory Visit: Payer: Self-pay

## 2021-12-10 ENCOUNTER — Encounter (HOSPITAL_COMMUNITY)
Admission: RE | Disposition: A | Discharge status: Home / Self Care | Payer: Self-pay | Source: Home / Self Care | Attending: Orthopaedic Surgery

## 2021-12-10 ENCOUNTER — Encounter (HOSPITAL_BASED_OUTPATIENT_CLINIC_OR_DEPARTMENT_OTHER): Payer: Self-pay | Admitting: Orthopaedic Surgery

## 2021-12-10 DIAGNOSIS — S46012A Strain of muscle(s) and tendon(s) of the rotator cuff of left shoulder, initial encounter: Secondary | ICD-10-CM

## 2021-12-10 DIAGNOSIS — M75102 Unspecified rotator cuff tear or rupture of left shoulder, not specified as traumatic: Secondary | ICD-10-CM

## 2021-12-10 DIAGNOSIS — I10 Essential (primary) hypertension: Secondary | ICD-10-CM | POA: Diagnosis not present

## 2021-12-10 DIAGNOSIS — M7522 Bicipital tendinitis, left shoulder: Secondary | ICD-10-CM | POA: Insufficient documentation

## 2021-12-10 DIAGNOSIS — F419 Anxiety disorder, unspecified: Secondary | ICD-10-CM | POA: Insufficient documentation

## 2021-12-10 DIAGNOSIS — M75122 Complete rotator cuff tear or rupture of left shoulder, not specified as traumatic: Secondary | ICD-10-CM | POA: Insufficient documentation

## 2021-12-10 DIAGNOSIS — F32A Depression, unspecified: Secondary | ICD-10-CM | POA: Insufficient documentation

## 2021-12-10 DIAGNOSIS — G8918 Other acute postprocedural pain: Secondary | ICD-10-CM | POA: Diagnosis not present

## 2021-12-10 DIAGNOSIS — K219 Gastro-esophageal reflux disease without esophagitis: Secondary | ICD-10-CM | POA: Insufficient documentation

## 2021-12-10 HISTORY — PX: SHOULDER ARTHROSCOPY WITH ROTATOR CUFF REPAIR AND OPEN BICEPS TENODESIS: SHX6677

## 2021-12-10 SURGERY — SHOULDER ARTHROSCOPY WITH ROTATOR CUFF REPAIR AND OPEN BICEPS TENODESIS
Anesthesia: General | Site: Shoulder | Laterality: Left

## 2021-12-10 MED ORDER — MIDAZOLAM HCL 2 MG/2ML IJ SOLN
INTRAMUSCULAR | Status: AC
  Administered 2021-12-10: 2 mg via INTRAVENOUS
  Filled 2021-12-10: qty 2

## 2021-12-10 MED ORDER — LACTATED RINGERS IV SOLN: INTRAVENOUS | Status: DC

## 2021-12-10 MED ORDER — AMISULPRIDE (ANTIEMETIC) 5 MG/2ML IV SOLN
INTRAVENOUS | Status: AC
  Filled 2021-12-10: qty 4

## 2021-12-10 MED ORDER — ONDANSETRON HCL 4 MG/2ML IJ SOLN
INTRAMUSCULAR | Status: AC
  Filled 2021-12-10: qty 2

## 2021-12-10 MED ORDER — CEFAZOLIN SODIUM-DEXTROSE 2-4 GM/100ML-% IV SOLN
2.0000 g | INTRAVENOUS | Status: AC
  Administered 2021-12-10: 2 g via INTRAVENOUS
  Filled 2021-12-10: qty 100

## 2021-12-10 MED ORDER — PROPOFOL 10 MG/ML IV BOLUS
INTRAVENOUS | Status: AC
  Filled 2021-12-10: qty 20

## 2021-12-10 MED ORDER — ACETAMINOPHEN 500 MG PO TABS
1000.0000 mg | ORAL_TABLET | Freq: Once | ORAL | Status: AC
  Administered 2021-12-10: 1000 mg via ORAL
  Filled 2021-12-10: qty 2

## 2021-12-10 MED ORDER — FENTANYL CITRATE (PF) 100 MCG/2ML IJ SOLN: 100.0000 ug | Freq: Once | INTRAMUSCULAR | Status: AC

## 2021-12-10 MED ORDER — ONDANSETRON HCL 4 MG/2ML IJ SOLN
INTRAMUSCULAR | Status: DC | PRN
  Administered 2021-12-10 (×2): 4 mg via INTRAVENOUS

## 2021-12-10 MED ORDER — ONDANSETRON HCL 4 MG/2ML IJ SOLN: 4.0000 mg | Freq: Four times a day (QID) | INTRAMUSCULAR | Status: DC | PRN

## 2021-12-10 MED ORDER — ROCURONIUM BROMIDE 10 MG/ML (PF) SYRINGE
PREFILLED_SYRINGE | INTRAVENOUS | Status: AC
  Filled 2021-12-10: qty 10

## 2021-12-10 MED ORDER — AMISULPRIDE (ANTIEMETIC) 5 MG/2ML IV SOLN
10.0000 mg | Freq: Once | INTRAVENOUS | Status: AC
  Administered 2021-12-10: 10 mg via INTRAVENOUS

## 2021-12-10 MED ORDER — BUPIVACAINE HCL (PF) 0.5 % IJ SOLN
INTRAMUSCULAR | Status: DC | PRN
  Administered 2021-12-10: 15 mL via PERINEURAL

## 2021-12-10 MED ORDER — DEXMEDETOMIDINE HCL IN NACL 400 MCG/100ML IV SOLN
INTRAVENOUS | Status: DC | PRN
  Administered 2021-12-10: 12 ug via INTRAVENOUS

## 2021-12-10 MED ORDER — BUPIVACAINE LIPOSOME 1.3 % IJ SUSP
INTRAMUSCULAR | Status: DC | PRN
  Administered 2021-12-10: 10 mL via PERINEURAL

## 2021-12-10 MED ORDER — SODIUM CHLORIDE 0.9 % IR SOLN
Status: DC | PRN
  Administered 2021-12-10 (×4): 3000 mL

## 2021-12-10 MED ORDER — EPINEPHRINE PF 1 MG/ML IJ SOLN
INTRAMUSCULAR | Status: DC | PRN
  Administered 2021-12-10: 1 mL
  Administered 2021-12-10: 1 mg

## 2021-12-10 MED ORDER — DEXAMETHASONE SODIUM PHOSPHATE 10 MG/ML IJ SOLN
INTRAMUSCULAR | Status: AC
  Filled 2021-12-10: qty 1

## 2021-12-10 MED ORDER — FENTANYL CITRATE (PF) 100 MCG/2ML IJ SOLN: 25.0000 ug | INTRAMUSCULAR | Status: DC | PRN

## 2021-12-10 MED ORDER — ORAL CARE MOUTH RINSE: 15.0000 mL | Freq: Once | OROMUCOSAL | Status: AC

## 2021-12-10 MED ORDER — CHLORHEXIDINE GLUCONATE 0.12 % MT SOLN
15.0000 mL | Freq: Once | OROMUCOSAL | Status: AC
  Administered 2021-12-10: 15 mL via OROMUCOSAL
  Filled 2021-12-10: qty 15

## 2021-12-10 MED ORDER — BUPIVACAINE HCL (PF) 0.25 % IJ SOLN
INTRAMUSCULAR | Status: AC
  Filled 2021-12-10: qty 30

## 2021-12-10 MED ORDER — ROCURONIUM BROMIDE 10 MG/ML (PF) SYRINGE
PREFILLED_SYRINGE | INTRAVENOUS | Status: DC | PRN
  Administered 2021-12-10: 50 mg via INTRAVENOUS

## 2021-12-10 MED ORDER — SUGAMMADEX SODIUM 200 MG/2ML IV SOLN
INTRAVENOUS | Status: DC | PRN
  Administered 2021-12-10: 200 mg via INTRAVENOUS

## 2021-12-10 MED ORDER — LIDOCAINE 2% (20 MG/ML) 5 ML SYRINGE
INTRAMUSCULAR | Status: AC
  Filled 2021-12-10: qty 5

## 2021-12-10 MED ORDER — TRANEXAMIC ACID-NACL 1000-0.7 MG/100ML-% IV SOLN
1000.0000 mg | INTRAVENOUS | Status: AC
  Administered 2021-12-10: 1000 mg via INTRAVENOUS
  Filled 2021-12-10: qty 100

## 2021-12-10 MED ORDER — OXYCODONE HCL 5 MG/5ML PO SOLN: 5.0000 mg | Freq: Once | ORAL | Status: DC | PRN

## 2021-12-10 MED ORDER — PHENYLEPHRINE HCL-NACL 20-0.9 MG/250ML-% IV SOLN
INTRAVENOUS | Status: DC | PRN
  Administered 2021-12-10: 75 ug/min via INTRAVENOUS

## 2021-12-10 MED ORDER — OXYCODONE HCL 5 MG PO TABS: 5.0000 mg | ORAL_TABLET | Freq: Once | ORAL | Status: DC | PRN

## 2021-12-10 MED ORDER — DEXAMETHASONE SODIUM PHOSPHATE 10 MG/ML IJ SOLN
INTRAMUSCULAR | Status: DC | PRN
  Administered 2021-12-10: 5 mg via INTRAVENOUS

## 2021-12-10 MED ORDER — FENTANYL CITRATE (PF) 250 MCG/5ML IJ SOLN
INTRAMUSCULAR | Status: AC
  Filled 2021-12-10: qty 5

## 2021-12-10 MED ORDER — GABAPENTIN 300 MG PO CAPS
300.0000 mg | ORAL_CAPSULE | Freq: Once | ORAL | Status: AC
  Administered 2021-12-10: 300 mg via ORAL
  Filled 2021-12-10: qty 1

## 2021-12-10 MED ORDER — FENTANYL CITRATE (PF) 100 MCG/2ML IJ SOLN
INTRAMUSCULAR | Status: AC
  Administered 2021-12-10: 100 ug via INTRAVENOUS
  Filled 2021-12-10: qty 2

## 2021-12-10 MED ORDER — MIDAZOLAM HCL 2 MG/2ML IJ SOLN: 2.0000 mg | Freq: Once | INTRAMUSCULAR | Status: AC

## 2021-12-10 MED ORDER — EPINEPHRINE PF 1 MG/ML IJ SOLN
INTRAMUSCULAR | Status: AC
  Filled 2021-12-10: qty 2

## 2021-12-10 MED ORDER — PROPOFOL 10 MG/ML IV BOLUS
INTRAVENOUS | Status: DC | PRN
  Administered 2021-12-10: 150 mg via INTRAVENOUS

## 2021-12-10 SURGICAL SUPPLY — 54 items
ANCHOR SUT QUATTRO KNTLS 4.5 (Anchor) IMPLANT
ANCHR JUGGERKNOT 2.9 WT/BL DL (Anchor) IMPLANT
BAG COUNTER SPONGE SURGICOUNT (BAG) ×1 IMPLANT
BIT DRILL HRD BONE QL 2.9 ANCH (DRILL) IMPLANT
BLADE EXCALIBUR 4.0X13 (MISCELLANEOUS) ×1 IMPLANT
BLADE SURG 11 STRL SS (BLADE) IMPLANT
CANNULA 5.75X71 LONG (CANNULA) ×1 IMPLANT
CANNULA TWIST IN 8.25X7CM (CANNULA) ×1 IMPLANT
CHLORAPREP W/TINT 26 (MISCELLANEOUS) ×1 IMPLANT
COOLER ICEMAN CLASSIC (MISCELLANEOUS) ×1 IMPLANT
COVER SURGICAL LIGHT HANDLE (MISCELLANEOUS) ×1 IMPLANT
DRAPE HALF SHEET 40X57 (DRAPES) ×1 IMPLANT
DRAPE INCISE IOBAN 66X45 STRL (DRAPES) ×1 IMPLANT
DRAPE SHOULDER BEACH CHAIR (DRAPES) ×1 IMPLANT
DRAPE U-SHAPE 47X51 STRL (DRAPES) ×2 IMPLANT
DRILL HARD BONE QL 2.9 ANCH (DRILL) ×1
DRSG TEGADERM 4X4.75 (GAUZE/BANDAGES/DRESSINGS) ×3 IMPLANT
DW OUTFLOW CASSETTE/TUBE SET (MISCELLANEOUS) ×1 IMPLANT
GAUZE SPONGE 4X4 12PLY STRL (GAUZE/BANDAGES/DRESSINGS) ×1 IMPLANT
GAUZE XEROFORM 1X8 LF (GAUZE/BANDAGES/DRESSINGS) ×1 IMPLANT
GLOVE BIOGEL PI IND STRL 8 (GLOVE) ×1 IMPLANT
GLOVE ECLIPSE 6.0 STRL STRAW (GLOVE) ×1 IMPLANT
GLOVE ECLIPSE 8.0 STRL XLNG CF (GLOVE) ×1 IMPLANT
GLOVE SURG UNDER POLY LF SZ6.5 (GLOVE) ×1 IMPLANT
GOWN STRL REUS W/ TWL LRG LVL3 (GOWN DISPOSABLE) ×2 IMPLANT
GOWN STRL REUS W/TWL LRG LVL3 (GOWN DISPOSABLE) ×2
KIT BASIN OR (CUSTOM PROCEDURE TRAY) ×1 IMPLANT
KIT STABILIZATION SHOULDER (MISCELLANEOUS) ×1 IMPLANT
KIT TURNOVER KIT B (KITS) ×1 IMPLANT
MANIFOLD NEPTUNE II (INSTRUMENTS) ×1 IMPLANT
NDL SCORPION MULTI FIRE (NEEDLE) IMPLANT
NDL SPNL 18GX3.5 QUINCKE PK (NEEDLE) ×1 IMPLANT
NDL SUT PASSER RTC (NEEDLE) IMPLANT
NEEDLE SCORPION MULTI FIRE (NEEDLE) IMPLANT
NEEDLE SPNL 18GX3.5 QUINCKE PK (NEEDLE) ×1 IMPLANT
NEEDLE SUT PASSER RTC (NEEDLE) ×1 IMPLANT
NS IRRIG 1000ML POUR BTL (IV SOLUTION) ×1 IMPLANT
PACK ARTHROSCOPY DSU (CUSTOM PROCEDURE TRAY) ×1 IMPLANT
PACK SHOULDER (CUSTOM PROCEDURE TRAY) ×1 IMPLANT
PAD ABD 7.5X8 STRL (GAUZE/BANDAGES/DRESSINGS) IMPLANT
PAD ARMBOARD 7.5X6 YLW CONV (MISCELLANEOUS) ×2 IMPLANT
PAD COLD SHLDR WRAP-ON (PAD) IMPLANT
PORT APPOLLO RF 90DEGREE MULTI (SURGICAL WAND) ×1 IMPLANT
RESTRAINT HEAD UNIVERSAL NS (MISCELLANEOUS) ×1 IMPLANT
SLING ARM IMMOBILIZER LRG (SOFTGOODS) IMPLANT
SPONGE T-LAP 4X18 ~~LOC~~+RFID (SPONGE) ×1 IMPLANT
SUT BROADBAND TAPE 2PK 2.3 (SUTURE) IMPLANT
SUT ETHILON 3 0 PS 1 (SUTURE) ×1 IMPLANT
SYR 30ML LL (SYRINGE) ×1 IMPLANT
TOWEL GREEN STERILE (TOWEL DISPOSABLE) ×1 IMPLANT
TOWEL GREEN STERILE FF (TOWEL DISPOSABLE) ×1 IMPLANT
TUBE CONNECTING 12X1/4 (SUCTIONS) ×1 IMPLANT
TUBING ARTHROSCOPY IRRIG 16FT (MISCELLANEOUS) ×1 IMPLANT
WATER STERILE IRR 1000ML POUR (IV SOLUTION) ×1 IMPLANT

## 2021-12-10 NOTE — Op Note (Signed)
Date of Surgery: 12/10/2021  INDICATIONS: Ms. Sealey is a 76 y.o.-year-old female with left shoulder rotator cuff tear and biceps tendinitis.  The risk and benefits of the procedure were discussed in detail and documented in the pre-operative evaluation.   PREOPERATIVE DIAGNOSES: Left shoulder, chronic rotator cuff tear.  POSTOPERATIVE DIAGNOSIS: Same.  PROCEDURE: Arthroscopic limited debridement - 76811 Arthroscopic subacromial decompression - 57262 Arthroscopic rotator cuff repair - 03559 Arthroscopic biceps tenodesis - 74163  SURGEON: Yevonne Pax MD  ASSISTANT: Raynelle Fanning, ATC  ANESTHESIA:  general plus interscalene nerve block  IV FLUIDS AND URINE: See anesthesia record.  ANTIBIOTICS: Ancef  ESTIMATED BLOOD LOSS: 10 mL.  IMPLANTS:  Implant Name Type Inv. Item Serial No. Manufacturer Lot No. LRB No. Used Action  ANCHR JUGGERKNOT 2.9 WT/BL DL - [id number] Anchor ANCHR JUGGERKNOT 2.9 WT/BL DL  [employer](ORTH,TRAU,BIO,SG) [id number] Left 1 Implanted  ANCHR JUGGERKNOT 2.9 WT/BL DL - [id number] Anchor ANCHR JUGGERKNOT 2.9 WT/BL DL  ZIMMER [employer]ON(ORTH,TRAU,BIO,SG) [id number] Left 1 Implanted  ANCHOR SUT QUATTRO KNTLS 4.5 - [id number] Anchor ANCHOR SUT QUATTRO KNTLS 4.5  ZIMMER [employer]ON(ORTH,TRAU,BIO,SG) [id number] Left 1 Implanted    DRAINS: None  CULTURES: None  COMPLICATIONS: none  PROCEDURE:    OPERATIVE FINDING: Exam under anesthesia:   Examination under anesthesia revealed forward elevation of 150 degrees.  With the arm at the side, there was 65 degrees of external rotation.  There is a 1+ anterior load shift and a 1+ posterior load shift.    Arthroscopic findings demonstrated: Articular space: Grade 1 cartilage changes involving humeral head Chondral surfaces: Normal Biceps: Insertion fraying and redness Subscapularis: Intact Supraspinatus: Complete full thickness tear  Infraspinatus: Intact    I identified the patient in the pre-operative holding  area.  I marked the operative right shoulder with my initials. I reviewed the risks and benefits of the proposed surgical intervention and the patient wished to proceed.  Anesthesia was then performed with regional block.  The patient was transferred to the operative suite and placed in the beach chair position with all bony prominences padded.     SCDs were placed on bilateral lower extremity. Appropriate antibiotics was administered within 1 hour before incision.  Anesthesia was induced.  The operative extremity was then prepped and draped in standard fashion. A time out was performed confirming the correct extremity, correct patient and correct procedure.   The arthroscope was introduced in the glenohumeral joint from a posterior portal.  An anterior portal was created.  The shoulder was examined and the above findings were noted.     With an arthroscopic shaver and a wand ablator, synovitis throughout the  shoulder was resected.  The arthroscopic shaver was used to excise torn portions of the labrum back to a stable margin. Specifically this was done for the anterior superior and posterior labrum.  A shaver was introduced into the anterior portal and used to debride the undersurface of the acromion and remove impinging osteophyte.  This was done in the subacromial space.  At this time the biceps was tagged with a self passing device with a #2 nonabsorbable suture.  This was done through the anterior portal.  A subsequent lateral portal was established through the subacromial space and the suture was retrieved out through this portal.  Using this lateral portal, an additional #2 nonabsorbable tape was passed in a [place] and repeated fashion through the biceps tendon 3 times.  This was then brought back to the anterior portal and saved for passage into  the lateral row.   Through visualization from intra-articular, the footprint of the rotator cuff was debrided of soft tissues back to bleeding bone.  This  was done with an arthroscopic shaver.     The rotator cuff was then approached through the subacromial space. Anterior, lateral, and posterior portals were used.  Bursectomy was performed with an arthroscopic shaver and ArthroCare wand.  The soft tissues on the undersurface of the acromion and clavicle were resected with the arthroscopic shaver and wand. There was good excursion that was noted of the tendon back to its footprint which would be amendable to an repair.   The rotator cuff was repaired with a double row configuration with two medial row all suture suture anchors as noted above with sutures passed from anterior to posterior in horizontal fashion with a self passing suture device.  Given that the posterior aspect of the tear was at the musculotendinous junction the medial row posterior all suture anchor was placed with 1 limb through the lateral tendon tissue and 1 limb through the medial rotator cuff.  This was tied in a mattress fashion.  This was done twice.   A total of 4 limbs of suture were passed through the remaining anterior portion of the rotator cuff supraspinatus tendon tear evenly.  The sutures including the biceps tendon sutures were placed with a single lateral row anchor.  This provided excellent anatomic footprint approximation.   The shoulder was irrigated.  The arthroscopic instruments were removed.  Wounds were closed with 3-0 nylon sutures.  A sterile dressing was applied with xeroform, 4x8s, abdominal pad, and tape. An Gar Gibbon was placed and the upper extremity was placed in a shoulder immobilizer.  The patient tolerated the procedure well and was taken to the recovery room in stable condition.  All counts were correct in the case. The patient tolerated the procedure well and was taken to the recovery room in stable condition.    POSTOPERATIVE PLAN: She will be placed in a sling and will begin physical therapy.  I will see her back in 2 weeks for wound check.  She will be  placed on aspirin for dvt prophylaxis.  Yevonne Pax, MD 2:46 PM

## 2021-12-10 NOTE — Brief Op Note (Signed)
   Brief Op Note  Date of Surgery: 12/10/2021  Preoperative Diagnosis: LEFT SHOULDER ROTATOR CUFF TEAR  Postoperative Diagnosis: same  Procedure: Procedure(s): LEFT SHOULDER ARTHROSCOPY WITH ROTATOR CUFF REPAIR AND OPEN BICEPS TENODESIS  Implants: Implant Name Type Inv. Item Serial No. Manufacturer Lot No. LRB No. Used Action  ANCHR JUGGERKNOT 2.9 WT/BL DL - [id number] Anchor ANCHR JUGGERKNOT 2.9 WT/BL DL  [employer]MER [employer]ON(ORTH,TRAU,BIO,SG) [id number] Left 1 Implanted  ANCHR JUGGERKNOT 2.9 WT/BL DL - [id number] Anchor ANCHR JUGGERKNOT 2.9 WT/BL DL  ZIMMER [employer]ON(ORTH,TRAU,BIO,SG) [id number] Left 1 Implanted  ANCHOR SUT QUATTRO KNTLS 4.5 - [id number] Anchor ANCHOR SUT QUATTRO KNTLS 4.5  ZIMMER [employer]ON(ORTH,TRAU,BIO,SG) [id number] Left 1 Implanted    Surgeons: Surgeon(s): Vanetta Mulders, MD  Anesthesia: Regional    Estimated Blood Loss: See anesthesia record  Complications: None  Condition to PACU: Stable  Yevonne Pax, MD 12/10/2021 2:40 PM

## 2021-12-10 NOTE — Anesthesia Procedure Notes (Signed)
Anesthesia Regional Block: Interscalene brachial plexus block   Pre-Anesthetic Checklist: , timeout performed,  Correct Patient, Correct Site, Correct Laterality,  Correct Procedure, Correct Position, site marked,  Risks and benefits discussed,  Surgical consent,  Pre-op evaluation,  At surgeon's request and post-op pain management  Laterality: Left  Prep: chloraprep       Needles:  Injection technique: Single-shot  Needle Type: Echogenic Stimulator Needle     Needle Length: 5cm  Needle Gauge: 22     Additional Needles:   Procedures:, nerve stimulator,,,,,     Nerve Stimulator or Paresthesia:  Response: biceps flexion, 0.45 mA  Additional Responses:   Narrative:  Start time: 12/10/2021 11:30 AM End time: 12/10/2021 11:40 AM Injection made incrementally with aspirations every 5 mL.  Performed by: Personally  Anesthesiologist: Albertha Ghee, MD  Additional Notes: Functioning IV was confirmed and monitors were applied.  A 86m 22ga Arrow echogenic stimulator needle was used. Sterile prep and drape,hand hygiene and sterile gloves were used.  Negative aspiration and negative test dose prior to incremental administration of local anesthetic. The patient tolerated the procedure well.  Ultrasound guidance: relevent anatomy identified, needle position confirmed, local anesthetic spread visualized around nerve(s), vascular puncture avoided.  Image printed for medical record.

## 2021-12-10 NOTE — Interval H&P Note (Signed)
History and Physical Interval Note:  12/10/2021 11:58 AM  Heidi Shah  has presented today for surgery, with the diagnosis of LEFT SHOULDER [place].  The various methods of treatment have been discussed with the patient and family. After consideration of risks, benefits and other options for treatment, the patient has consented to  Procedure(s): LEFT SHOULDER ARTHROSCOPY WITH ROTATOR CUFF REPAIR AND OPEN BICEPS TENODESIS (Left) as a surgical intervention.  The patient's history has been reviewed, patient examined, no change in status, stable for surgery.  I have reviewed the patient's chart and labs.  Questions were answered to the patient's satisfaction.     Vanetta Mulders

## 2021-12-10 NOTE — Discharge Instructions (Signed)
     Discharge Instructions    Attending Surgeon: Vanetta Mulders, MD Office Phone Number: [phone]   Diagnosis and Procedures:    Surgeries Performed: Left shoulder rotator cuff repair, biceps tenodesis  Discharge Plan:    Diet: Resume usual diet. Begin with light or bland foods.  Drink plenty of fluids.  Activity:  Keep sling and dressing in place until your follow up visit in Physical Therapy You are advised to go home directly from the hospital or surgical center. Restrict your activities.  GENERAL INSTRUCTIONS: 1.  Keep your surgical site elevated above your heart for at least 5-7 days or longer to prevent swelling. This will improve your comfort and your overall recovery following surgery.     2. Please call Dr. Eddie Dibbles office at [phone] with questions Monday-Friday during business hours. If no one answers, please leave a message and someone should get back to the patient within 24 hours. For emergencies please call 911 or proceed to the emergency room.   3. Patient to notify surgical team if experiences any of the following: Bowel/Bladder dysfunction, uncontrolled pain, nerve/muscle weakness, incision with increased drainage or redness, nausea/vomiting and Fever greater than 101.0 F.  Be alert for signs of infection including redness, streaking, odor, fever or chills. Be alert for excessive pain or bleeding and notify your surgeon immediately.  WOUND INSTRUCTIONS:   Leave your dressing/cast/splint in place until your post operative visit.  Keep it clean and dry.  Always keep the incision clean and dry until the staples/sutures are removed. If there is no drainage from the incision you should keep it open to air. If there is drainage from the incision you must keep it covered at all times until the drainage stops  Do not soak in a bath tub, hot tub, pool, lake or other body of water until 21 days after your surgery and your incision is completely dry and healed.   If you have removable sutures (or staples) they must be removed 10-14 days (unless otherwise instructed) from the day of your surgery.     1)  Elevate the extremity as much as possible.  2)  Keep the dressing clean and dry.  3)  Please call us if the dressing becomes wet or dirty.  4)  If you are experiencing worsening pain or worsening swelling, please call.     MEDICATIONS: Resume all previous home medications at the previous prescribed dose and frequency unless otherwise noted Start taking the  pain medications on an as-needed basis as prescribed  Please taper down pain medication over the next week following surgery.  Ideally you should not require a refill of any narcotic pain medication.  Take pain medication with food to minimize nausea. In addition to the prescribed pain medication, you may take over-the-counter pain relievers such as Tylenol.  Do NOT take additional tylenol if your pain medication already has tylenol in it.  Aspirin '325mg'$  daily for four weeks.      FOLLOWUP INSTRUCTIONS: 1. Follow up at the Physical Therapy Clinic 3-4 days following surgery. This appointment should be scheduled unless other arrangements have been made.The Physical Therapy scheduling number is [phone] if an appointment has not already been arranged.  2. Contact Dr. Eddie Dibbles office during office hours at [phone] or the practice after hours line at [phone] for non-emergencies. For medical emergencies call 911.   Discharge Location: Home

## 2021-12-10 NOTE — Anesthesia Procedure Notes (Signed)
Procedure Name: Intubation Date/Time: 12/10/2021 12:47 PM  Performed by: Michele Rockers, CRNAPre-anesthesia Checklist: Patient identified, Patient being monitored, Timeout performed, Emergency Drugs available and Suction available Patient Re-evaluated:Patient Re-evaluated prior to induction Oxygen Delivery Method: Circle System Utilized Preoxygenation: Pre-oxygenation with 100% oxygen Induction Type: IV induction Ventilation: Mask ventilation without difficulty Laryngoscope Size: Miller and 2 Grade View: Grade II Tube type: Oral Tube size: 7.0 mm Number of attempts: 1 Airway Equipment and Method: Stylet Placement Confirmation: ETT inserted through vocal cords under direct vision, positive ETCO2 and breath sounds checked- equal and bilateral Secured at: 20 cm Tube secured with: Tape Dental Injury: Teeth and Oropharynx as per pre-operative assessment

## 2021-12-10 NOTE — Transfer of Care (Signed)
Immediate Anesthesia Transfer of Care Note  Patient: Heidi Shah  Procedure(s) Performed: LEFT SHOULDER ARTHROSCOPY WITH ROTATOR CUFF REPAIR AND OPEN BICEPS TENODESIS (Left: Shoulder)  Patient Location: PACU  Anesthesia Type:GA combined with regional for post-op pain  Level of Consciousness: sedated  Airway & Oxygen Therapy: Patient Spontanous Breathing and Patient connected to nasal cannula oxygen  Post-op Assessment: Report given to RN and Post -op Vital signs reviewed and stable  Post vital signs: Reviewed and stable  Last Vitals:  Vitals Value Taken Time  BP 128/81 12/10/21 1451  Temp    Pulse 89 12/10/21 1454  Resp 23 12/10/21 1454  SpO2 100 % 12/10/21 1454  Vitals shown include unvalidated device data.  Last Pain:  Vitals:   12/10/21 1042  TempSrc:   PainSc: 0-No pain         Complications: No notable events documented.

## 2021-12-10 NOTE — Anesthesia Preprocedure Evaluation (Signed)
Anesthesia Evaluation  Patient identified by MRN, date of birth, ID band Patient awake    Reviewed: Allergy & Precautions, H&P , NPO status , Patient's Chart, lab work & pertinent test results  Airway Mallampati: II   Neck ROM: full    Dental   Pulmonary neg pulmonary ROS,    breath sounds clear to auscultation       Cardiovascular hypertension,  Rhythm:regular Rate:Normal     Neuro/Psych PSYCHIATRIC DISORDERS Anxiety Depression  Neuromuscular disease    GI/Hepatic GERD  ,  Endo/Other    Renal/GU      Musculoskeletal  (+) Arthritis ,   Abdominal   Peds  Hematology   Anesthesia Other Findings   Reproductive/Obstetrics                             Anesthesia Physical Anesthesia Plan  ASA: 2  Anesthesia Plan: General   Post-op Pain Management: Regional block*   Induction: Intravenous  PONV Risk Score and Plan: 3 and Ondansetron, Dexamethasone, Midazolam and Treatment may vary due to age or medical condition  Airway Management Planned: Oral ETT  Additional Equipment:   Intra-op Plan:   Post-operative Plan: Extubation in OR  Informed Consent: I have reviewed the patients History and Physical, chart, labs and discussed the procedure including the risks, benefits and alternatives for the proposed anesthesia with the patient or authorized representative who has indicated his/her understanding and acceptance.     Dental advisory given  Plan Discussed with: CRNA, Anesthesiologist and Surgeon  Anesthesia Plan Comments:         Anesthesia Quick Evaluation

## 2021-12-11 ENCOUNTER — Telehealth: Payer: Self-pay | Admitting: Family Medicine

## 2021-12-11 NOTE — Telephone Encounter (Signed)
Pt just had surgery and requesting xanax to help her thru recovery.

## 2021-12-11 NOTE — Anesthesia Postprocedure Evaluation (Signed)
Anesthesia Post Note  Patient: Kadi Hession Viscardi  Procedure(s) Performed: LEFT SHOULDER ARTHROSCOPY WITH ROTATOR CUFF REPAIR AND OPEN BICEPS TENODESIS (Left: Shoulder)     Patient location during evaluation: PACU Anesthesia Type: General and Regional Level of consciousness: awake and alert Pain management: pain level controlled Vital Signs Assessment: post-procedure vital signs reviewed and stable Respiratory status: spontaneous breathing, nonlabored ventilation, respiratory function stable and patient connected to nasal cannula oxygen Cardiovascular status: blood pressure returned to baseline and stable Postop Assessment: no apparent nausea or vomiting Anesthetic complications: no   No notable events documented.  Last Vitals:  Vitals:   12/10/21 1530 12/10/21 1545  BP: 120/75 119/78  Pulse: 79 82  Resp: 12 13  Temp:  36.5 C  SpO2: 94% 92%    Last Pain:  Vitals:   12/10/21 1545  TempSrc:   PainSc: 0-No pain                 Tiffannie Sloss S

## 2021-12-11 NOTE — Telephone Encounter (Signed)
Spoke with patient.   Cautioned about use of opioids plus benzo.   She has some left over Lorazepam and will use if severe anxiety and also is trying Advil vs opioid for now unless pain worse.

## 2021-12-15 ENCOUNTER — Encounter (HOSPITAL_BASED_OUTPATIENT_CLINIC_OR_DEPARTMENT_OTHER): Payer: Self-pay | Admitting: Physical Therapy

## 2021-12-15 ENCOUNTER — Other Ambulatory Visit: Payer: Self-pay

## 2021-12-15 ENCOUNTER — Ambulatory Visit (HOSPITAL_BASED_OUTPATIENT_CLINIC_OR_DEPARTMENT_OTHER): Payer: Medicare HMO | Attending: Family Medicine | Admitting: Physical Therapy

## 2021-12-15 DIAGNOSIS — M25612 Stiffness of left shoulder, not elsewhere classified: Secondary | ICD-10-CM | POA: Diagnosis not present

## 2021-12-15 DIAGNOSIS — M25512 Pain in left shoulder: Secondary | ICD-10-CM | POA: Diagnosis not present

## 2021-12-15 DIAGNOSIS — S46012A Strain of muscle(s) and tendon(s) of the rotator cuff of left shoulder, initial encounter: Secondary | ICD-10-CM | POA: Diagnosis not present

## 2021-12-15 DIAGNOSIS — X58XXXA Exposure to other specified factors, initial encounter: Secondary | ICD-10-CM | POA: Insufficient documentation

## 2021-12-15 DIAGNOSIS — M6281 Muscle weakness (generalized): Secondary | ICD-10-CM | POA: Insufficient documentation

## 2021-12-15 DIAGNOSIS — M75102 Unspecified rotator cuff tear or rupture of left shoulder, not specified as traumatic: Secondary | ICD-10-CM | POA: Diagnosis present

## 2021-12-15 NOTE — Therapy (Signed)
OUTPATIENT PHYSICAL THERAPY SHOULDER EVALUATION   Patient Name: Heidi Shah MRN: [id number] DOB:[birth year], 76 y.o., female Today's Date: 12/15/2021   PT End of Session - 12/15/21 0848     Visit Number 1    Number of Visits 25    Date for PT Re-Evaluation 03/05/22    Authorization Type [employer] MCR    PT Start Time 0845    PT Stop Time 0927    PT Time Calculation (min) 42 min    Activity Tolerance Patient tolerated treatment well    Behavior During Therapy WFL for tasks assessed/performed             Past Medical History:  Diagnosis Date   Allergy    Anxiety    Arthritis    Cataract    surgery - Bilateral cataracts removed   Chicken pox    Colon polyps    Depression    Diverticulitis    Diverticulosis    Esophageal stricture    Family history of adverse reaction to anesthesia    difficult time waking mother up   GERD (gastroesophageal reflux disease)    Heart murmur    Hypertension    Past Surgical History:  Procedure Laterality Date   cataract surgery Bilateral 1996   COLONOSCOPY  2022   EYE SURGERY  2000   detached L retina   HERNIA REPAIR Bilateral 1990   inguinal   NECK SURGERY     as a child   POLYPECTOMY  2022   SHOULDER ARTHROSCOPY WITH ROTATOR CUFF REPAIR AND OPEN BICEPS TENODESIS Left 12/10/2021   Procedure: LEFT SHOULDER ARTHROSCOPY WITH ROTATOR CUFF REPAIR AND OPEN BICEPS TENODESIS;  Surgeon: Vanetta Mulders, MD;  Location: [facility];  Service: Orthopedics;  Laterality: Left;   UPPER GASTROINTESTINAL ENDOSCOPY  2022   Patient Active Problem List   Diagnosis Date Noted   Traumatic complete tear of left rotator cuff    Cervical spondylosis with myelopathy and radiculopathy 03/14/2020   Neck pain 03/14/2020   Aortic heart murmur 02/25/2020   Osteoporosis 04/04/2019   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   Unspecified constipation 03/05/2013   GERD (gastroesophageal reflux disease) 01/19/2013   Esophageal spasm 01/19/2013   Dysphagia,  unspecified(787.20) 01/19/2013   Diverticulitis of colon (without mention of hemorrhage)(562.11) 11/23/2012   History of depression 11/23/2012   Essential hypertension, benign 11/23/2012    REFERRING PROVIDER: Vanetta Mulders, MD  REFERRING DIAG: Postop, left shoulder rotator cuff repair PROCEDURE: Arthroscopic limited debridement - 29822 Arthroscopic subacromial decompression - 29826 Arthroscopic rotator cuff repair - 39122 Arthroscopic biceps tenodesis - 58346  THERAPY DIAG:  Acute pain of left shoulder  Stiffness of left shoulder, not elsewhere classified  Muscle weakness (generalized)  Rationale for Evaluation and Treatment Rehabilitation  ONSET DATE: DOS 12/10/21  SUBJECTIVE:  SUBJECTIVE STATEMENT: I really don't have any pain.   PERTINENT HISTORY: H/o neck pain, OP  PAIN:  Are you having pain? No  PRECAUTIONS: Shoulder  WEIGHT BEARING RESTRICTIONS Yes shoulder  FALLS:  Has patient fallen in last 6 months? 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  PLOF: Independent  PATIENT GOALS strengthen arm  OBJECTIVE:   PATIENT SURVEYS:  FOTO 33   POSTURE: Forward shoulder as expected postop in sling  UPPER EXTREMITY ROM:   Passive ROM Right eval Left eval  Shoulder flexion    Shoulder extension    Shoulder abduction    Shoulder adduction    Shoulder internal rotation    Shoulder external rotation    Elbow flexion    Elbow extension    Wrist flexion    Wrist extension    Wrist ulnar deviation    Wrist radial deviation    Wrist pronation    Wrist supination    (Blank rows = not tested)  UPPER EXTREMITY MMT:  MMT Right eval Left eval  Shoulder flexion     Shoulder extension    Shoulder abduction    Shoulder adduction    Shoulder internal rotation    Shoulder external rotation    Middle trapezius    Lower trapezius    Elbow flexion    Elbow extension    Wrist flexion    Wrist extension    Wrist ulnar deviation    Wrist radial deviation    Wrist pronation    Wrist supination    Grip strength (lbs)    (Blank rows = not tested)   PALPATION:  No concerns, incisions healing well   TODAY'S TREATMENT:  PROM- shoulder flexion, elbow flexion/ext Reviewed HEP Changed bandages   PATIENT EDUCATION: Education details: Anatomy of condition, POC, HEP, exercise form/rationale Person educated: Patient and daughter Education method: Explanation, Demonstration, Tactile cues, Verbal cues, and Handouts Education comprehension: verbalized understanding, returned demonstration, verbal cues required, tactile cues required, and needs further education   HOME EXERCISE PROGRAM: [id number]  ASSESSMENT:  CLINICAL IMPRESSION: Patient is a 76 y.o. F who was seen today for physical therapy evaluation and treatment for s/p RCR abd biceps tenodesis.    OBJECTIVE IMPAIRMENTS decreased activity tolerance, decreased ROM, decreased strength, increased edema, increased muscle spasms, impaired flexibility, impaired UE functional use, improper body mechanics, postural dysfunction, and pain.   ACTIVITY LIMITATIONS carrying, lifting, sleeping, bed mobility, bathing, dressing, reach over head, hygiene/grooming, and caring for others  PARTICIPATION LIMITATIONS: meal prep, cleaning, laundry, driving, shopping, community activity, and occupation  PERSONAL FACTORS 1-2 comorbidities: h/o neck pain, OP  are also affecting patient's functional outcome.   REHAB POTENTIAL: Good  CLINICAL DECISION MAKING: Stable/uncomplicated  EVALUATION COMPLEXITY: Low   GOALS: Goals reviewed with patient? Yes  SHORT TERM GOALS: Target date: 01/08/22  PROM goals met for 4  weeks (140 FF, 40 ER, 60 ABD) Baseline: see obj Goal status: INITIAL  2.  Average pain <=4/10 Baseline: none at rest at eval Goal status: INITIAL    LONG TERM GOALS: Target date: 03/12/22  Will tolerate beginning light resistance exercises to shoulder Baseline: unable at eval Goal status: INITIAL Target date: 02/04/22 (8 weeks)  2.  Able to be out of her sling during the day, pain <=4/10 Baseline: unable at eval Goal status: INITIAL Target date: 02/04/22 (8 weeks)  3.  Able to dress and fix hair  independently and without pain Baseline: unable at eval Goal status: INITIAL Target date: 03/04/22 (12 weeks)  4.  AROM within 5 deg of opp UE Baseline: unable at eval Goal status: INITIAL Target date: 03/04/22 (12 weeks)   PLAN: PT FREQUENCY: 1-2x/week  PT DURATION: 12 weeks  PLANNED INTERVENTIONS: Therapeutic exercises, Therapeutic activity, Neuromuscular re-education, Patient/Family education, Self Care, Joint mobilization, Aquatic Therapy, Dry Needling, Spinal mobilization, Cryotherapy, Moist heat, Taping, Manual therapy, and Re-evaluation  PLAN FOR NEXT SESSION: continue per protocol   Referring diagnosis? Y37.096K (ICD-10-CM) - Traumatic complete tear of left rotator cuff, initial encounter Treatment diagnosis? (if different than referring diagnosis) M25.512 M25.612, M62.81 What was this (referring dx) caused by? [x]  Surgery []  Fall []  Ongoing issue []  Arthritis []  Other: ____________  Laterality: []  Rt [x]  Lt []  Both  Check all possible CPT codes:  *CHOOSE 10 OR LESS*    []  97110 (Therapeutic Exercise)  []  92507 (SLP Treatment)  []  97112 (Neuro Re-ed)   []  92526 (Swallowing Treatment)   []  97116 (Gait Training)   []  [id number] (Cognitive Training, 1st 15 minutes) []  97140 (Manual Therapy)   []  97130 (Cognitive Training, each add'l 15 minutes)  []  97164 (Re-evaluation)                              []  Other, List CPT Code ____________  []  38381 (Therapeutic  Activities)     []  97535 (Self Care)   [x]  All codes above (97110 - 97535)  []  97012 (Mechanical Traction)  [x]  97014 (E-stim Unattended)  []  84037 (E-stim manual)  []  97033 (Ionto)  []  97035 (Ultrasound) []  97750 (Physical Performance Training) [x]  [id number] (Aquatic Therapy) []  54360 (Vasopneumatic Device) []  [id number] (Paraffin) []  97034 (Contrast Bath) []  97597 (Wound Care 1st 20 sq cm) []  97598 (Wound Care each add'l 20 sq cm) []  97760 (Orthotic Fabrication, Fitting, Training Initial) []  [id number] (Prosthetic Management and Training Initial) []  [id number] (Orthotic or Prosthetic Training/ Modification Subsequent)   Piper Albro C. Lenwood Balsam PT, DPT 12/15/21 2:06 PM

## 2021-12-16 ENCOUNTER — Other Ambulatory Visit: Payer: Self-pay | Admitting: Internal Medicine

## 2021-12-16 DIAGNOSIS — K259 Gastric ulcer, unspecified as acute or chronic, without hemorrhage or perforation: Secondary | ICD-10-CM

## 2021-12-24 ENCOUNTER — Ambulatory Visit (INDEPENDENT_AMBULATORY_CARE_PROVIDER_SITE_OTHER): Payer: Medicare HMO | Admitting: Orthopaedic Surgery

## 2021-12-24 ENCOUNTER — Encounter (HOSPITAL_BASED_OUTPATIENT_CLINIC_OR_DEPARTMENT_OTHER): Payer: Self-pay | Admitting: Physical Therapy

## 2021-12-24 ENCOUNTER — Ambulatory Visit (HOSPITAL_BASED_OUTPATIENT_CLINIC_OR_DEPARTMENT_OTHER): Payer: Medicare HMO | Admitting: Physical Therapy

## 2021-12-24 DIAGNOSIS — M25512 Pain in left shoulder: Secondary | ICD-10-CM | POA: Diagnosis not present

## 2021-12-24 DIAGNOSIS — S46012A Strain of muscle(s) and tendon(s) of the rotator cuff of left shoulder, initial encounter: Secondary | ICD-10-CM

## 2021-12-24 DIAGNOSIS — M25612 Stiffness of left shoulder, not elsewhere classified: Secondary | ICD-10-CM | POA: Diagnosis not present

## 2021-12-24 DIAGNOSIS — M6281 Muscle weakness (generalized): Secondary | ICD-10-CM

## 2021-12-24 NOTE — Therapy (Signed)
OUTPATIENT PHYSICAL THERAPY SHOULDER TREATMENT   Patient Name: Heidi Shah MRN: [id number] DOB:1945-04-16, 76 y.o., female Today's Date: 12/24/2021   PT End of Session - 12/24/21 1052     Visit Number 2    Number of Visits 25    Date for PT Re-Evaluation 03/05/22    Authorization Type [employer] MCR    PT Start Time 1015    PT Stop Time 1045    PT Time Calculation (min) 30 min    Activity Tolerance Patient tolerated treatment well    Behavior During Therapy WFL for tasks assessed/performed              Past Medical History:  Diagnosis Date   Allergy    Anxiety    Arthritis    Cataract    surgery - Bilateral cataracts removed   Chicken pox    Colon polyps    Depression    Diverticulitis    Diverticulosis    Esophageal stricture    Family history of adverse reaction to anesthesia    difficult time waking mother up   GERD (gastroesophageal reflux disease)    Heart murmur    Hypertension    Past Surgical History:  Procedure Laterality Date   cataract surgery Bilateral 1996   COLONOSCOPY  2022   EYE SURGERY  2000   detached L retina   HERNIA REPAIR Bilateral 1990   inguinal   NECK SURGERY     as a child   POLYPECTOMY  2022   SHOULDER ARTHROSCOPY WITH ROTATOR CUFF REPAIR AND OPEN BICEPS TENODESIS Left 12/10/2021   Procedure: LEFT SHOULDER ARTHROSCOPY WITH ROTATOR CUFF REPAIR AND OPEN BICEPS TENODESIS;  Surgeon: Vanetta Mulders, MD;  Location: [facility];  Service: Orthopedics;  Laterality: Left;   UPPER GASTROINTESTINAL ENDOSCOPY  2022   Patient Active Problem List   Diagnosis Date Noted   Traumatic complete tear of left rotator cuff    Cervical spondylosis with myelopathy and radiculopathy 03/14/2020   Neck pain 03/14/2020   Aortic heart murmur 02/25/2020   Osteoporosis 04/04/2019   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   Unspecified constipation 03/05/2013   GERD (gastroesophageal reflux disease) 01/19/2013   Esophageal spasm 01/19/2013   Dysphagia,  unspecified(787.20) 01/19/2013   Diverticulitis of colon (without mention of hemorrhage)(562.11) 11/23/2012   History of depression 11/23/2012   Essential hypertension, benign 11/23/2012    REFERRING PROVIDER: Vanetta Mulders, MD  REFERRING DIAG: Postop, left shoulder rotator cuff repair PROCEDURE: Arthroscopic limited debridement - 29822 Arthroscopic subacromial decompression - 29826 Arthroscopic rotator cuff repair - 15830 Arthroscopic biceps tenodesis - 94076  THERAPY DIAG:  Acute pain of left shoulder  Stiffness of left shoulder, not elsewhere classified  Muscle weakness (generalized)  Rationale for Evaluation and Treatment Rehabilitation  ONSET DATE: DOS 12/10/21  Days since surgery: 14   SUBJECTIVE:  SUBJECTIVE STATEMENT: Pt states she does not have any. She states he removed the ABD pillow today but she feels tempted she can move the arm freely. She still has well controlled pain.   PERTINENT HISTORY: H/o neck pain, OP  PAIN:  Are you having pain? No  PRECAUTIONS: Shoulder  WEIGHT BEARING RESTRICTIONS Yes shoulder  FALLS:  Has patient fallen in last 6 months? 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  PLOF: Independent  PATIENT GOALS strengthen arm  OBJECTIVE:   PATIENT SURVEYS:  FOTO 16     TODAY'S TREATMENT:  PROM- flexion to 95, ABD to 60, IR 40, ER to 20 Joint mob: L GHJ inf glide grade II  Exercises  - Wrist AROM Flexion Extension  - 3 x daily - 7 x weekly - 3 sets - 10 reps - Elbow Flexion PROM  - 3 x daily - 7 x weekly - 1 sets - 20 reps - Flexion-Extension Shoulder Pendulum with Table Support  - 3 x daily - 7 x weekly - 1 sets - 20 reps - Horizontal  Shoulder Pendulum with Table Support  - 3 x daily - 7 x weekly - 3 sets - 20 reps - Seated Scapular Retraction  - 3 x daily - 7 x weekly - 1 sets - 20 reps    PATIENT EDUCATION: Education details: joint protection, precautions, protocol, cryotherapy, anatomy, exercise progression, DOMS expectations, muscle firing,  envelope of function, HEP, POC  Person educated: Patient and daughter Education method: Explanation, Demonstration, Tactile cues, Verbal cues, and Handouts Education comprehension: verbalized understanding, returned demonstration, verbal cues required, tactile cues required, and needs further education   HOME EXERCISE PROGRAM: [id number]  ASSESSMENT:  CLINICAL IMPRESSION: Pt with good tolerance to PROM and able to progress ROM at today's session without pain. Pt is 2 wks at this time. Pt gave verbal understanding to all precautions given and need for protection of L surgical shoulder. Pt requests to put ABD pillow back under the arm in order to prevent excessive motion and temptation to perform AROM with L. HEP updated today to include pendulums. Pt still with well managed pain. Plan to continue with PROM at next. Pt would benefit from continued skilled therapy in order to reach goals and maximize functional L UE strength and ROM for full return to PLOF.    OBJECTIVE IMPAIRMENTS decreased activity tolerance, decreased ROM, decreased strength, increased edema, increased muscle spasms, impaired flexibility, impaired UE functional use, improper body mechanics, postural dysfunction, and pain.   ACTIVITY LIMITATIONS carrying, lifting, sleeping, bed mobility, bathing, dressing, reach over head, hygiene/grooming, and caring for others  PARTICIPATION LIMITATIONS: meal prep, cleaning, laundry, driving, shopping, community activity, and occupation  PERSONAL FACTORS 1-2 comorbidities: h/o neck pain, OP  are also affecting patient's functional outcome.   REHAB POTENTIAL: Good  CLINICAL  DECISION MAKING: Stable/uncomplicated  EVALUATION COMPLEXITY: Low   GOALS: Goals reviewed with patient? Yes  SHORT TERM GOALS: Target date: 01/08/22  PROM goals met for 4 weeks (140 FF, 40 ER, 60 ABD) Baseline: see obj Goal status: INITIAL  2.  Average pain <=4/10 Baseline: none at rest at eval Goal status: INITIAL    LONG TERM GOALS: Target date: 03/12/22  Will tolerate beginning light resistance exercises to shoulder Baseline: unable at eval Goal status: INITIAL Target date: 02/04/22 (8 weeks)  2.  Able to be out of her sling during the day,  pain <=4/10 Baseline: unable at eval Goal status: INITIAL Target date: 02/04/22 (8 weeks)  3.  Able to dress and fix hair independently and without pain Baseline: unable at eval Goal status: INITIAL Target date: 03/04/22 (12 weeks)  4.  AROM within 5 deg of opp UE Baseline: unable at eval Goal status: INITIAL Target date: 03/04/22 (12 weeks)   PLAN: PT FREQUENCY: 1-2x/week  PT DURATION: 12 weeks  PLANNED INTERVENTIONS: Therapeutic exercises, Therapeutic activity, Neuromuscular re-education, Patient/Family education, Self Care, Joint mobilization, Aquatic Therapy, Dry Needling, Spinal mobilization, Cryotherapy, Moist heat, Taping, Manual therapy, and Re-evaluation  PLAN FOR NEXT SESSION: continue per protocol  Daleen Bo PT, DPT 12/24/21 10:57 AM

## 2021-12-24 NOTE — Progress Notes (Signed)
Post Operative Evaluation    Procedure/Date of Surgery: 12/10/21 left shoulder rotator cuff repair and biceps tenodesis  Interval History:   Presents today 2 weeks status post the above procedure.  She is overall doing very well.  She has been attending physical therapy.  She is compliant with aspirin usage.  Overall her pain is quite minimal.   PMH/PSH/Family History/Social History/Meds/Allergies:    Past Medical History:  Diagnosis Date   Allergy    Anxiety    Arthritis    Cataract    surgery - Bilateral cataracts removed   Chicken pox    Colon polyps    Depression    Diverticulitis    Diverticulosis    Esophageal stricture    Family history of adverse reaction to anesthesia    difficult time waking mother up   GERD (gastroesophageal reflux disease)    Heart murmur    Hypertension    Past Surgical History:  Procedure Laterality Date   cataract surgery Bilateral 1996   COLONOSCOPY  2022   EYE SURGERY  2000   detached L retina   HERNIA REPAIR Bilateral 1990   inguinal   NECK SURGERY     as a child   POLYPECTOMY  2022   SHOULDER ARTHROSCOPY WITH ROTATOR CUFF REPAIR AND OPEN BICEPS TENODESIS Left 12/10/2021   Procedure: LEFT SHOULDER ARTHROSCOPY WITH ROTATOR CUFF REPAIR AND OPEN BICEPS TENODESIS;  Surgeon: Vanetta Mulders, MD;  Location: [facility];  Service: Orthopedics;  Laterality: Left;   UPPER GASTROINTESTINAL ENDOSCOPY  2022   Social History   Socioeconomic History   Marital status: Divorced    Spouse name: Not on file   Number of children: 2   Years of education: master's degree   Highest education level: Master's degree (e.g., MA, MS, MEng, MEd, MSW, MBA)  Occupational History   Occupation: retired Pharmacist, hospital    Comment: part - time Pharmacist, hospital  Tobacco Use   Smoking status: Never   Smokeless tobacco: Never  Vaping Use   Vaping Use: Never used  Substance and Sexual Activity   Alcohol use: Yes    Alcohol/week: 1.0 standard  drink of alcohol    Types: 1 Glasses of wine per week    Comment: one or two glasses of wine a couple times a week   Drug use: No   Sexual activity: Not on file  Other Topics Concern   Not on file  Social History Narrative      Lives alone in ranch home.    2 children   Son lives in [place]   Continues to teach virtually part time   Social Determinants of Health   Financial Resource Strain: Low Risk  (05/19/2021)   Overall Financial Resource Strain (CARDIA)    Difficulty of Paying Living Expenses: Not hard at all  Food Insecurity: No Food Insecurity (05/19/2021)   Hunger Vital Sign    Worried About Running Out of Food in the Last Year: Never true    Ran Out of Food in the Last Year: Never true  Transportation Needs: No Transportation Needs (05/19/2021)   PRAPARE - Hydrologist (Medical): No    Lack of Transportation (Non-Medical): No  Physical Activity: Sufficiently Active (05/19/2021)   Exercise Vital Sign    Days of Exercise per  Week: 3 days    Minutes of Exercise per Session: 60 min  Stress: No Stress Concern Present (05/19/2021)   [place]    Feeling of Stress : Only a little  Social Connections: Moderately Isolated (05/19/2021)   Social Connection and Isolation Panel [NHANES]    Frequency of Communication with Friends and Family: Once a week    Frequency of Social Gatherings with Friends and Family: Once a week    Attends Religious Services: More than 4 times per year    Active Member of [employer] or Organizations: Yes    Attends Music therapist: More than 4 times per year    Marital Status: Divorced   Family History  Problem Relation Age of Onset   Heart disease Mother 49   COPD Mother    Crohn's disease Mother    Heart disease Father 53   Cancer Father        kidney   Colon cancer Neg Hx    Esophageal cancer Neg Hx    Stomach cancer Neg Hx    Rectal  cancer Neg Hx    Allergies  Allergen Reactions   Beeswax     Per skin test from dermatologist   Cetrimonium Chloride [Cetrimide]     Per skin test from dermatologist   Methylisothiazolinone     Per skin test from dermatologist   Neomycin Sulfate [Neomycin]     Per skin test from dermatologist   Nickel Sulfate [Nickel]     Per skin test from dermatologist   Penicillins     Childhood Reaction    Propolis     Per skin test from dermatologist   Current Outpatient Medications  Medication Sig Dispense Refill   Biotin w/ Vitamins C & E (HAIR SKIN & NAILS GUMMIES PO) Take 2 each by mouth daily.     Calcium Carb-Cholecalciferol (CALCIUM 600+D3 PO) Take 1 tablet by mouth daily.     Cholecalciferol 25 MCG (1000 UT) CHEW Chew 1,000 Units by mouth daily.     cyanocobalamin (VITAMIN B12) 1000 MCG tablet Take 1,000 mcg by mouth daily.     Ginkgo Biloba 60 MG CAPS Take 60 mg by mouth daily.     ibuprofen (ADVIL) 200 MG tablet Take 400 mg by mouth every 8 (eight) hours as needed for moderate pain.     lisinopril-hydrochlorothiazide (ZESTORETIC) 20-12.5 MG tablet TAKE ONE TABLET BY MOUTH ONE TIME DAILY 90 tablet 1   loratadine (CLARITIN) 10 MG tablet Take 10 mg by mouth daily.     Misc Natural Products (ELDERBERRY IMMUNE COMPLEX PO) Take 1 each by mouth daily.     Multiple Vitamin (MULTI-VITAMIN) tablet Take 1 tablet by mouth daily.     omeprazole (PRILOSEC) 40 MG capsule TAKE ONE CAPSULE BY MOUTH ONE TIME DAILY 30 capsule 3   oxyCODONE (OXY IR/ROXICODONE) 5 MG immediate release tablet Take 1 tablet (5 mg total) by mouth every 4 (four) hours as needed (severe pain). 20 tablet 0   Probiotic Product (PROBIOTIC DAILY PO) Take 1 capsule by mouth daily.     triamcinolone cream (KENALOG) 0.1 % Apply 1 Application topically 2 (two) times daily as needed. 30 g 0   vitamin C (ASCORBIC ACID) 500 MG tablet Take 500 mg by mouth daily.     VYZULTA 0.024 % SOLN Place 1 drop into both eyes at bedtime.     No  current facility-administered medications for this visit.  No results found.  Review of Systems:   A ROS was performed including pertinent positives and negatives as documented in the HPI.   Musculoskeletal Exam:    There were no vitals taken for this visit.  Left shoulder incisions are well-appearing.  She is able to flex and extend the left elbow.  In the supine position she is able to passively forward elevate to 90 degrees and externally rotates approximately 50 degrees.  2+ radial pulse  Imaging:      I personally reviewed and interpreted the radiographs.   Assessment:   2 weeks status post left shoulder rotator cuff repair and biceps tenodesis overall doing very well.  She will continue to advance according to the rehab protocol for rotator cuff.  At this time I have discontinued her abduction pillow.  I will plan to see her back in 4 weeks for reassessment.  All precautions and limitations discussed  Plan :    -Return to clinic in 4 weeks for reassessment      I personally saw and evaluated the patient, and participated in the management and treatment plan.  Vanetta Mulders, MD Attending Physician, Orthopedic Surgery  This document was dictated using Dragon voice recognition software. A reasonable attempt at proof reading has been made to minimize errors.

## 2021-12-29 ENCOUNTER — Telehealth: Payer: Self-pay | Admitting: Family Medicine

## 2021-12-29 DIAGNOSIS — J398 Other specified diseases of upper respiratory tract: Secondary | ICD-10-CM | POA: Diagnosis not present

## 2021-12-29 DIAGNOSIS — Z20822 Contact with and (suspected) exposure to covid-19: Secondary | ICD-10-CM | POA: Diagnosis not present

## 2021-12-29 NOTE — Telephone Encounter (Signed)
Noted  Heidi Papesh [name] MD  [facility]  

## 2021-12-29 NOTE — Telephone Encounter (Signed)
Patient reported she went to Urgent care in [place] for Nasal congestion and cough, x3 days. Patient was informed of the message and verbalized understanding. Video visit scheduled for tomorrow

## 2021-12-29 NOTE — Telephone Encounter (Signed)
Bad cough and congestion since last Thursday, seeking guidance on whether she can take these methylprednisolone '40mg'$ , levofloxacin or is you will prescribe something else for her.

## 2021-12-30 ENCOUNTER — Ambulatory Visit (HOSPITAL_BASED_OUTPATIENT_CLINIC_OR_DEPARTMENT_OTHER): Payer: Medicare HMO | Admitting: Physical Therapy

## 2021-12-30 ENCOUNTER — Encounter: Payer: Self-pay | Admitting: Family Medicine

## 2021-12-30 ENCOUNTER — Telehealth (INDEPENDENT_AMBULATORY_CARE_PROVIDER_SITE_OTHER): Payer: Medicare HMO | Admitting: Family Medicine

## 2021-12-30 VITALS — HR 107 | Ht 60.0 in | Wt 125.0 lb

## 2021-12-30 DIAGNOSIS — J209 Acute bronchitis, unspecified: Secondary | ICD-10-CM

## 2021-12-30 MED ORDER — HYDROCODONE BIT-HOMATROP MBR 5-1.5 MG/5ML PO SOLN: 5.0000 mL | Freq: Four times a day (QID) | ORAL | 0 refills | Status: DC | PRN

## 2021-12-30 NOTE — Progress Notes (Signed)
Patient ID: Heidi Shah, female   DOB: 1945-10-13, 76 y.o.   MRN: [id number]   Virtual Visit via Video Note  I connected with Heidi Shah on 12/30/21 at  4:30 PM EDT by a video enabled telemedicine application and verified that I am speaking with the correct person using two identifiers.  Location patient: home Location provider:work or home office Persons participating in the virtual visit: patient, provider  I discussed the limitations of evaluation and management by telemedicine and the availability of in person appointments. The patient expressed understanding and agreed to proceed.   HPI:  Heidi Shah has upper respiratory illness.  Her recent history is that she had left shoulder surgery September 14 for rotator cuff tear.  She is currently in [place] recuperating with a daughter.  About 4 days ago she developed some nasal congestion and cough.  No fever.  Question of some intermittent wheezing.  She went to local urgent care there and had chest x-ray which was normal.  Influenza testing and COVID testing negative.  She developed some nausea earlier today which she thinks is due to postnasal drainage.  No abdominal pain.  No diarrhea.  She had been prescribed prednisone, albuterol, and Levaquin but has not started any those yet.  She had concerns about potential medication side effects.  She has tried Delsym over-the-counter cough syrup without much relief.  Is having fairly severe cough especially at night.  O2 sats have consistently been 97 to 98%.  Her left shoulder is doing well.  Has not required any oxycodone for pain relief She denies any chest pains or pleuritic pain.  ROS: See pertinent positives and negatives per HPI.  Past Medical History:  Diagnosis Date   Allergy    Anxiety    Arthritis    Cataract    surgery - Bilateral cataracts removed   Chicken pox    Colon polyps    Depression    Diverticulitis    Diverticulosis    Esophageal stricture    Family  history of adverse reaction to anesthesia    difficult time waking mother up   GERD (gastroesophageal reflux disease)    Heart murmur    Hypertension     Past Surgical History:  Procedure Laterality Date   cataract surgery Bilateral 1996   COLONOSCOPY  2022   EYE SURGERY  2000   detached L retina   HERNIA REPAIR Bilateral 1990   inguinal   NECK SURGERY     as a child   POLYPECTOMY  2022   SHOULDER ARTHROSCOPY WITH ROTATOR CUFF REPAIR AND OPEN BICEPS TENODESIS Left 12/10/2021   Procedure: LEFT SHOULDER ARTHROSCOPY WITH ROTATOR CUFF REPAIR AND OPEN BICEPS TENODESIS;  Surgeon: Vanetta Mulders, MD;  Location: [facility];  Service: Orthopedics;  Laterality: Left;   UPPER GASTROINTESTINAL ENDOSCOPY  2022    Family History  Problem Relation Age of Onset   Heart disease Mother 60   COPD Mother    Crohn's disease Mother    Heart disease Father 21   Cancer Father        kidney   Colon cancer Neg Hx    Esophageal cancer Neg Hx    Stomach cancer Neg Hx    Rectal cancer Neg Hx     SOCIAL HX: Non-smoker   Current Outpatient Medications:    aspirin 325 MG tablet, Take 325 mg by mouth daily., Disp: , Rfl:    Biotin w/ Vitamins C & E (HAIR SKIN & NAILS GUMMIES  PO), Take 2 each by mouth daily., Disp: , Rfl:    Calcium Carb-Cholecalciferol (CALCIUM 600+D3 PO), Take 1 tablet by mouth daily., Disp: , Rfl:    Cholecalciferol 25 MCG (1000 UT) CHEW, Chew 1,000 Units by mouth daily., Disp: , Rfl:    cyanocobalamin (VITAMIN B12) 1000 MCG tablet, Take 1,000 mcg by mouth daily., Disp: , Rfl:    Ginkgo Biloba 60 MG CAPS, Take 60 mg by mouth daily., Disp: , Rfl:    HYDROcodone bit-homatropine (HYCODAN) 5-1.5 MG/5ML syrup, Take 5 mLs by mouth every 6 (six) hours as needed for cough., Disp: 120 mL, Rfl: 0   ibuprofen (ADVIL) 200 MG tablet, Take 400 mg by mouth every 8 (eight) hours as needed for moderate pain., Disp: , Rfl:    lisinopril-hydrochlorothiazide (ZESTORETIC) 20-12.5 MG tablet, TAKE ONE TABLET  BY MOUTH ONE TIME DAILY, Disp: 90 tablet, Rfl: 1   loratadine (CLARITIN) 10 MG tablet, Take 10 mg by mouth daily., Disp: , Rfl:    Misc Natural Products (ELDERBERRY IMMUNE COMPLEX PO), Take 1 each by mouth daily., Disp: , Rfl:    Multiple Vitamin (MULTI-VITAMIN) tablet, Take 1 tablet by mouth daily., Disp: , Rfl:    omeprazole (PRILOSEC) 40 MG capsule, TAKE ONE CAPSULE BY MOUTH ONE TIME DAILY, Disp: 30 capsule, Rfl: 3   Probiotic Product (PROBIOTIC DAILY PO), Take 1 capsule by mouth daily., Disp: , Rfl:    triamcinolone cream (KENALOG) 0.1 %, Apply 1 Application topically 2 (two) times daily as needed., Disp: 30 g, Rfl: 0   vitamin C (ASCORBIC ACID) 500 MG tablet, Take 500 mg by mouth daily., Disp: , Rfl:    VYZULTA 0.024 % SOLN, Place 1 drop into both eyes at bedtime., Disp: , Rfl:   EXAM:  VITALS per patient if applicable:  GENERAL: alert, oriented, appears well and in no acute distress  HEENT: atraumatic, conjunttiva clear, no obvious abnormalities on inspection of external nose and ears  NECK: normal movements of the head and neck  LUNGS: on inspection no signs of respiratory distress, breathing rate appears normal, no obvious gross SOB, gasping or wheezing  CV: no obvious cyanosis  MS: moves all visible extremities without noticeable abnormality  PSYCH/NEURO: pleasant and cooperative, no obvious depression or anxiety, speech and thought processing grossly intact  ASSESSMENT AND PLAN:  Discussed the following assessment and plan:  Patient has 4-day history of upper respiratory symptoms.  Developed some nausea earlier today but keeping down some fluids.  Cough particular bothersome at night.  Recent chest x-ray at urgent care unremarkable.  COVID and flu testing negative.  This sounds most likely viral.  We suggested the following  -Use albuterol inhaler every 4-6 hours as needed for wheezing -Consider going ahead and starting the prednisone especially if she is having ongoing  wheezing -Would hold Levaquin at this time unless she has any clear indicators such as fever.  Especially in setting of recent rotator cuff surgery we will try to avoid quinolones -She has some Zofran 4 mg to take every 8 hours as needed for nausea -We did write for limited Hycodan cough syrup 1 teaspoon every 6 hours as needed for severe cough. -Follow-up immediately or go to ER for any fever or increased shortness of breath.     I discussed the assessment and treatment plan with the patient. The patient was provided an opportunity to ask questions and all were answered. The patient agreed with the plan and demonstrated an understanding of the instructions.  The patient was advised to call back or seek an in-person evaluation if the symptoms worsen or if the condition fails to improve as anticipated.     Carolann Littler, MD

## 2021-12-31 DIAGNOSIS — R112 Nausea with vomiting, unspecified: Secondary | ICD-10-CM | POA: Diagnosis not present

## 2021-12-31 DIAGNOSIS — J40 Bronchitis, not specified as acute or chronic: Secondary | ICD-10-CM | POA: Diagnosis not present

## 2021-12-31 DIAGNOSIS — I10 Essential (primary) hypertension: Secondary | ICD-10-CM | POA: Diagnosis not present

## 2021-12-31 DIAGNOSIS — Z20828 Contact with and (suspected) exposure to other viral communicable diseases: Secondary | ICD-10-CM | POA: Diagnosis not present

## 2021-12-31 DIAGNOSIS — K219 Gastro-esophageal reflux disease without esophagitis: Secondary | ICD-10-CM | POA: Diagnosis not present

## 2021-12-31 DIAGNOSIS — R059 Cough, unspecified: Secondary | ICD-10-CM | POA: Diagnosis not present

## 2021-12-31 DIAGNOSIS — Z20822 Contact with and (suspected) exposure to covid-19: Secondary | ICD-10-CM | POA: Diagnosis not present

## 2022-01-06 ENCOUNTER — Encounter (HOSPITAL_BASED_OUTPATIENT_CLINIC_OR_DEPARTMENT_OTHER): Payer: Self-pay | Admitting: Physical Therapy

## 2022-01-06 ENCOUNTER — Ambulatory Visit (HOSPITAL_BASED_OUTPATIENT_CLINIC_OR_DEPARTMENT_OTHER): Payer: Medicare HMO | Attending: Family Medicine | Admitting: Physical Therapy

## 2022-01-06 ENCOUNTER — Encounter: Payer: Self-pay | Admitting: Family Medicine

## 2022-01-06 ENCOUNTER — Ambulatory Visit (INDEPENDENT_AMBULATORY_CARE_PROVIDER_SITE_OTHER): Payer: Medicare HMO | Admitting: Family Medicine

## 2022-01-06 VITALS — BP 132/72 | HR 102 | Temp 98.1°F | Ht 60.0 in | Wt 120.2 lb

## 2022-01-06 DIAGNOSIS — M25512 Pain in left shoulder: Secondary | ICD-10-CM | POA: Diagnosis not present

## 2022-01-06 DIAGNOSIS — M25612 Stiffness of left shoulder, not elsewhere classified: Secondary | ICD-10-CM | POA: Insufficient documentation

## 2022-01-06 DIAGNOSIS — J209 Acute bronchitis, unspecified: Secondary | ICD-10-CM | POA: Diagnosis not present

## 2022-01-06 DIAGNOSIS — M6281 Muscle weakness (generalized): Secondary | ICD-10-CM | POA: Insufficient documentation

## 2022-01-06 DIAGNOSIS — G8929 Other chronic pain: Secondary | ICD-10-CM | POA: Diagnosis not present

## 2022-01-06 NOTE — Therapy (Addendum)
OUTPATIENT PHYSICAL THERAPY SHOULDER TREATMENT   Patient Name: Heidi Shah MRN: [id number] DOB:[birth year], 76 y.o., female Today's Date: 01/06/2022   PT End of Session - 01/06/22 1010     Visit Number 3    Number of Visits 25    Date for PT Re-Evaluation 03/05/22    Authorization Type [employer] M[employer]    PT Start Time 0930    PT Stop Time 1000    PT Time Calculation (min) 30 min    Activity Tolerance Patient tolerated treatment well    Behavior During Therapy WFL for tasks assessed/performed               Past Medical History:  Diagnosis Date   Allergy    Anxiety    Arthritis    Cataract    surgery - Bilateral cataracts removed   Chicken pox    Colon polyps    Depression    Diverticulitis    Diverticulosis    Esophageal stricture    Family history of adverse reaction to anesthesia    difficult time waking mother up   GERD (gastroesophageal reflux disease)    Heart murmur    Hypertension    Past Surgical History:  Procedure Laterality Date   cataract surgery Bilateral 1996   COLONOSCOPY  2022   EYE SURGERY  2000   detached L retina   HERNIA REPAIR Bilateral 1990   inguinal   NECK SURGERY     as a child   POLYPECTOMY  2022   SHOULDER ARTHROSCOPY WITH ROTATOR CUFF REPAIR AND OPEN BICEPS TENODESIS Left 12/10/2021   Procedure: LEFT SHOULDER ARTHROSCOPY WITH ROTATOR CUFF REPAIR AND OPEN BICEPS TENODESIS;  Surgeon: Vanetta Mulders, MD;  Location: [facility];  Service: Orthopedics;  Laterality: Left;   UPPER GASTROINTESTINAL ENDOSCOPY  2022   Patient Active Problem List   Diagnosis Date Noted   Traumatic complete tear of left rotator cuff    Cervical spondylosis with myelopathy and radiculopathy 03/14/2020   Neck pain 03/14/2020   Aortic heart murmur 02/25/2020   Osteoporosis 04/04/2019   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   Unspecified constipation 03/05/2013   GERD (gastroesophageal reflux disease) 01/19/2013   Esophageal spasm 01/19/2013   Dysphagia,  unspecified(787.20) 01/19/2013   Diverticulitis of colon (without mention of hemorrhage)(562.11) 11/23/2012   History of depression 11/23/2012   Essential hypertension, benign 11/23/2012    REFERRING PROVIDER: Vanetta Mulders, MD  REFERRING DIAG: Postop, left shoulder rotator cuff repair PROCEDURE: Arthroscopic limited debridement - 29822 Arthroscopic subacromial decompression - 29826 Arthroscopic rotator cuff repair - 29827 Arthroscopic biceps tenodesis - 99833  THERAPY DIAG:  Acute pain of left shoulder  Stiffness of left shoulder, not elsewhere classified  Muscle weakness (generalized)  Chronic left shoulder pain  Rationale for Evaluation and Treatment Rehabilitation  ONSET DATE: DOS 12/10/21  Days since surgery: 27   SUBJECTIVE:  SUBJECTIVE STATEMENT: Pt is still with very well controlled pain. Recently went to ED with bronchitis. She has been wobbly/unsteady while at home but has been compliant with HEP. She has had diminished appetite and difficulty with hydration. Pt presents without acute distress but with a minor cough.   PERTINENT HISTORY: H/o neck pain, OP  PAIN:  Are you having pain? No  PRECAUTIONS: Shoulder  WEIGHT BEARING RESTRICTIONS Yes shoulder  FALLS:  Has patient fallen in last 6 months? 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  PLOF: Independent  PATIENT GOALS strengthen arm  OBJECTIVE:   PATIENT SURVEYS:  FOTO 4     TODAY'S TREATMENT:  PROM- flexion to 110, ABD to 80, IR 40, ER to 25 Joint mob: L GHJ inf glide grade II  Exercises -supine AAROM flexion 5s 10x -table slide ABD 5s 10x  Review of previous HEP     PATIENT  EDUCATION: Education details: joint protection, precautions, protocol, cryotherapy, anatomy, exercise progression, DOMS expectations, HEP, POC  Person educated: Patient and daughter Education method: Explanation, Demonstration, Tactile cues, Verbal cues, and Handouts Education comprehension: verbalized understanding, returned demonstration, verbal cues required, tactile cues required, and needs further education   HOME EXERCISE PROGRAM: [id number]  ASSESSMENT:  CLINICAL IMPRESSION: Pt returns to therapy after recent illness. Pt is now nearly at 4 wks. Pt able to improve ROM but does have minor joint stiffness as expected. Pt had improvement in ROM by end of session. HEP updated today as she 1 day away from 4 wk mark. Pt to start AAROM into pain free ranges at home. Plan to review AAROM at next and continue with progression of protocol. Trial pulley's at next. Pt still with very well managed pain, no issues throughout session. Pt would benefit from continued skilled therapy in order to reach goals and maximize functional L UE strength and ROM for full return to PLOF.    OBJECTIVE IMPAIRMENTS decreased activity tolerance, decreased ROM, decreased strength, increased edema, increased muscle spasms, impaired flexibility, impaired UE functional use, improper body mechanics, postural dysfunction, and pain.   ACTIVITY LIMITATIONS carrying, lifting, sleeping, bed mobility, bathing, dressing, reach over head, hygiene/grooming, and caring for others  PARTICIPATION LIMITATIONS: meal prep, cleaning, laundry, driving, shopping, community activity, and occupation  PERSONAL FACTORS 1-2 comorbidities: h/o neck pain, OP  are also affecting patient's functional outcome.   REHAB POTENTIAL: Good  CLINICAL DECISION MAKING: Stable/uncomplicated  EVALUATION COMPLEXITY: Low   GOALS: Goals reviewed with patient? Yes  SHORT TERM GOALS: Target date: 01/08/22  PROM goals met for 4 weeks (140 FF, 40 ER, 60  ABD) Baseline: see obj Goal status: INITIAL  2.  Average pain <=4/10 Baseline: none at rest at eval Goal status: INITIAL    LONG TERM GOALS: Target date: 03/12/22  Will tolerate beginning light resistance exercises to shoulder Baseline: unable at eval Goal status: INITIAL Target date: 02/04/22 (8 weeks)  2.  Able to be out of her sling during the day, pain <=4/10 Baseline: unable at eval Goal status: INITIAL Target date: 02/04/22 (8 weeks)  3.  Able to dress and fix hair independently and without pain Baseline: unable at eval Goal status: INITIAL Target date: 03/04/22 (12 weeks)  4.  AROM within 5 deg of opp UE Baseline: unable at eval Goal status: INITIAL Target date: 03/04/22 (12 weeks)   PLAN: PT FREQUENCY: 1-2x/week  PT DURATION: 12 weeks  PLANNED  INTERVENTIONS: Therapeutic exercises, Therapeutic activity, Neuromuscular re-education, Patient/Family education, Self Care, Joint mobilization, Aquatic Therapy, Dry Needling, Spinal mobilization, Cryotherapy, Moist heat, Taping, Manual therapy, and Re-evaluation  PLAN FOR NEXT SESSION: continue per protocol  Daleen Bo PT, DPT 01/06/22 10:13 AM

## 2022-01-06 NOTE — Progress Notes (Signed)
Established Patient Office Visit  Subjective   Patient ID: Heidi Shah, female    DOB: 1945/11/26  Age: 76 y.o. MRN: [id number]  Chief Complaint  Patient presents with   Hospitalization Follow-up    HPI   Mylo is seen for follow-up regarding recent respiratory illness.  She had left shoulder surgery September 14 for rotator cuff tear.  She has been down to [place] staying with the daughter for recuperation.  She developed some cough from 2 weeks ago with question of intermittent wheezing.  Went to local urgent care there and chest x-ray no pneumonia with negative influenza and COVID testing.  She had been prescribed prednisone, albuterol, and Levaquin.  She had concerns for side effects and after taking 1 dose of prednisone had vomiting and took no further.  We expressed our concerns about taking Levaquin immediately following surgery.  We sent in some Hycodan cough syrup to use for severe nighttime cough.  She ended up going the next day to ER there in [facility].  Had repeat chest x-ray which showed no pneumonia.  Was prescribed Zithromax.  Continued albuterol.  She feels like she is improving some at this time.  Still has some cough but mostly nonproductive.  No fever.  Overall feels better.  Past Medical History:  Diagnosis Date   Allergy    Anxiety    Arthritis    Cataract    surgery - Bilateral cataracts removed   Chicken pox    Colon polyps    Depression    Diverticulitis    Diverticulosis    Esophageal stricture    Family history of adverse reaction to anesthesia    difficult time waking mother up   GERD (gastroesophageal reflux disease)    Heart murmur    Hypertension    Past Surgical History:  Procedure Laterality Date   cataract surgery Bilateral 1996   COLONOSCOPY  2022   EYE SURGERY  2000   detached L retina   HERNIA REPAIR Bilateral 1990   inguinal   NECK SURGERY     as a child   POLYPECTOMY  2022   SHOULDER ARTHROSCOPY  WITH ROTATOR CUFF REPAIR AND OPEN BICEPS TENODESIS Left 12/10/2021   Procedure: LEFT SHOULDER ARTHROSCOPY WITH ROTATOR CUFF REPAIR AND OPEN BICEPS TENODESIS;  Surgeon: Vanetta Mulders, MD;  Location: [facility];  Service: Orthopedics;  Laterality: Left;   UPPER GASTROINTESTINAL ENDOSCOPY  2022    reports that she has never smoked. She has never used smokeless tobacco. She reports current alcohol use of about 1.0 standard drink of alcohol per week. She reports that she does not use drugs. family history includes COPD in her mother; Cancer in her father; Crohn's disease in her mother; Heart disease (age of onset: 22) in her mother; Heart disease (age of onset: 80) in her father. Allergies  Allergen Reactions   Beeswax     Per skin test from dermatologist   Cetrimonium Chloride [Cetrimide]     Per skin test from dermatologist   Methylisothiazolinone     Per skin test from dermatologist   Neomycin Sulfate [Neomycin]     Per skin test from dermatologist   Nickel Sulfate [Nickel]     Per skin test from dermatologist   Penicillins     Childhood Reaction    Propolis     Per skin test from dermatologist    Review of Systems  Constitutional:  Negative for chills and fever.  HENT:  Negative for  sore throat.   Respiratory:  Positive for cough. Negative for hemoptysis and wheezing.   Cardiovascular:  Negative for chest pain.      Objective:     BP 132/72 (BP Location: Left Arm, Patient Position: Sitting, Cuff Size: Normal)   Pulse (!) 102   Temp 98.1 F (36.7 C) (Oral)   Ht 5' (1.524 m)   Wt 120 lb 3.2 oz (54.5 kg)   SpO2 98%   BMI 23.47 kg/m  BP Readings from Last 3 Encounters:  01/06/22 132/72  12/10/21 119/78  12/04/21 (!) 161/89   Wt Readings from Last 3 Encounters:  01/06/22 120 lb 3.2 oz (54.5 kg)  12/30/21 125 lb (56.7 kg)  12/10/21 125 lb (56.7 kg)      Physical Exam Vitals reviewed.  Constitutional:      General: She is not in acute distress.    Appearance: She is  not ill-appearing or toxic-appearing.  HENT:     Ears:     Comments: He has some cerumen right canal.  Left canal is clear.  TMs appear normal. Cardiovascular:     Rate and Rhythm: Normal rate and regular rhythm.  Pulmonary:     Comments: She had a few faint rales in both lung bases which cleared with deep breathing.  No wheezes. Musculoskeletal:     Right lower leg: No edema.     Left lower leg: No edema.  Neurological:     Mental Status: She is alert.      No results found for any visits on 01/06/22.    The 10-year ASCVD risk score (Arnett DK, et al., 2019) is: 18.4%    Assessment & Plan:   Acute respiratory illness with cough.  She had 2 x-rays which showed no pneumonia.  COVID testing and influenza testing negative.  Improving at this time with nonfocal exam. -Continue albuterol inhaler as needed -Stay well-hydrated -Gradually increase walking activity as tolerated -Follow-up immediately for any fever or increased shortness of breath  No follow-ups on file.    Carolann Littler, MD

## 2022-01-06 NOTE — Patient Instructions (Signed)
Can use Debrox or Cerumenex.

## 2022-01-07 ENCOUNTER — Ambulatory Visit (INDEPENDENT_AMBULATORY_CARE_PROVIDER_SITE_OTHER): Payer: Medicare HMO | Admitting: Orthopaedic Surgery

## 2022-01-07 DIAGNOSIS — S46012A Strain of muscle(s) and tendon(s) of the rotator cuff of left shoulder, initial encounter: Secondary | ICD-10-CM

## 2022-01-07 NOTE — Progress Notes (Signed)
Post Operative Evaluation    Procedure/Date of Surgery: 12/10/21 left shoulder rotator cuff repair and biceps tenodesis  Interval History:   Presents today 4 weeks status post the above procedure.  She is overall doing very well.  She has been attending physical therapy.  At this time she is progressing passive range of motion.  She reports minimal pain.   PMH/PSH/Family History/Social History/Meds/Allergies:    Past Medical History:  Diagnosis Date   Allergy    Anxiety    Arthritis    Cataract    surgery - Bilateral cataracts removed   Chicken pox    Colon polyps    Depression    Diverticulitis    Diverticulosis    Esophageal stricture    Family history of adverse reaction to anesthesia    difficult time waking mother up   GERD (gastroesophageal reflux disease)    Heart murmur    Hypertension    Past Surgical History:  Procedure Laterality Date   cataract surgery Bilateral 1996   COLONOSCOPY  2022   EYE SURGERY  2000   detached L retina   HERNIA REPAIR Bilateral 1990   inguinal   NECK SURGERY     as a child   POLYPECTOMY  2022   SHOULDER ARTHROSCOPY WITH ROTATOR CUFF REPAIR AND OPEN BICEPS TENODESIS Left 12/10/2021   Procedure: LEFT SHOULDER ARTHROSCOPY WITH ROTATOR CUFF REPAIR AND OPEN BICEPS TENODESIS;  Surgeon: Vanetta Mulders, MD;  Location: [facility];  Service: Orthopedics;  Laterality: Left;   UPPER GASTROINTESTINAL ENDOSCOPY  2022   Social History   Socioeconomic History   Marital status: Divorced    Spouse name: Not on file   Number of children: 2   Years of education: master's degree   Highest education level: Master's degree (e.g., MA, MS, MEng, MEd, MSW, MBA)  Occupational History   Occupation: retired Pharmacist, hospital    Comment: part - time Pharmacist, hospital  Tobacco Use   Smoking status: Never   Smokeless tobacco: Never  Vaping Use   Vaping Use: Never used  Substance and Sexual Activity   Alcohol use: Yes    Alcohol/week: 1.0  standard drink of alcohol    Types: 1 Glasses of wine per week    Comment: one or two glasses of wine a couple times a week   Drug use: No   Sexual activity: Not on file  Other Topics Concern   Not on file  Social History Narrative      Lives alone in ranch home.    2 children   Son lives in [place]   Continues to teach virtually part time   Social Determinants of Health   Financial Resource Strain: Low Risk  (05/19/2021)   Overall Financial Resource Strain (CARDIA)    Difficulty of Paying Living Expenses: Not hard at all  Food Insecurity: No Food Insecurity (05/19/2021)   Hunger Vital Sign    Worried About Running Out of Food in the Last Year: Never true    Ran Out of Food in the Last Year: Never true  Transportation Needs: No Transportation Needs (05/19/2021)   PRAPARE - Hydrologist (Medical): No    Lack of Transportation (Non-Medical): No  Physical Activity: Sufficiently Active (05/19/2021)   Exercise Vital Sign    Days of  Exercise per Week: 3 days    Minutes of Exercise per Session: 60 min  Stress: No Stress Concern Present (05/19/2021)   [place]    Feeling of Stress : Only a little  Social Connections: Moderately Isolated (05/19/2021)   Social Connection and Isolation Panel [NHANES]    Frequency of Communication with Friends and Family: Once a week    Frequency of Social Gatherings with Friends and Family: Once a week    Attends Religious Services: More than 4 times per year    Active Member of [employer] or Organizations: Yes    Attends Music therapist: More than 4 times per year    Marital Status: Divorced   Family History  Problem Relation Age of Onset   Heart disease Mother 26   COPD Mother    Crohn's disease Mother    Heart disease Father 88   Cancer Father        kidney   Colon cancer Neg Hx    Esophageal cancer Neg Hx    Stomach cancer Neg Hx     Rectal cancer Neg Hx    Allergies  Allergen Reactions   Beeswax     Per skin test from dermatologist   Cetrimonium Chloride [Cetrimide]     Per skin test from dermatologist   Methylisothiazolinone     Per skin test from dermatologist   Neomycin Sulfate [Neomycin]     Per skin test from dermatologist   Nickel Sulfate [Nickel]     Per skin test from dermatologist   Penicillins     Childhood Reaction    Propolis     Per skin test from dermatologist   Current Outpatient Medications  Medication Sig Dispense Refill   aspirin 325 MG tablet Take 325 mg by mouth daily.     Biotin w/ Vitamins C & E (HAIR SKIN & NAILS GUMMIES PO) Take 2 each by mouth daily.     Calcium Carb-Cholecalciferol (CALCIUM 600+D3 PO) Take 1 tablet by mouth daily.     Cholecalciferol 25 MCG (1000 UT) CHEW Chew 1,000 Units by mouth daily.     cyanocobalamin (VITAMIN B12) 1000 MCG tablet Take 1,000 mcg by mouth daily.     Ginkgo Biloba 60 MG CAPS Take 60 mg by mouth daily.     HYDROcodone bit-homatropine (HYCODAN) 5-1.5 MG/5ML syrup Take 5 mLs by mouth every 6 (six) hours as needed for cough. 120 mL 0   ibuprofen (ADVIL) 200 MG tablet Take 400 mg by mouth every 8 (eight) hours as needed for moderate pain.     lisinopril-hydrochlorothiazide (ZESTORETIC) 20-12.5 MG tablet TAKE ONE TABLET BY MOUTH ONE TIME DAILY 90 tablet 1   loratadine (CLARITIN) 10 MG tablet Take 10 mg by mouth daily.     Misc Natural Products (ELDERBERRY IMMUNE COMPLEX PO) Take 1 each by mouth daily.     Multiple Vitamin (MULTI-VITAMIN) tablet Take 1 tablet by mouth daily.     omeprazole (PRILOSEC) 40 MG capsule TAKE ONE CAPSULE BY MOUTH ONE TIME DAILY 30 capsule 3   Probiotic Product (PROBIOTIC DAILY PO) Take 1 capsule by mouth daily.     triamcinolone cream (KENALOG) 0.1 % Apply 1 Application topically 2 (two) times daily as needed. 30 g 0   vitamin C (ASCORBIC ACID) 500 MG tablet Take 500 mg by mouth daily.     VYZULTA 0.024 % SOLN Place 1 drop  into both eyes at bedtime.  No current facility-administered medications for this visit.   No results found.  Review of Systems:   A ROS was performed including pertinent positives and negatives as documented in the HPI.   Musculoskeletal Exam:    There were no vitals taken for this visit.  Left shoulder incisions are well-appearing.  She is able to flex and extend the left elbow.  In the supine position she is able to passively forward elevate to 105 degrees and externally rotates approximately 50 degrees.  2+ radial pulse  Imaging:      I personally reviewed and interpreted the radiographs.   Assessment:   4 weeks status post left shoulder rotator cuff repair and biceps tenodesis overall doing very well.  She will continue to advance according to the rehab protocol for rotator cuff.  She will progress according to my rotator cuff protocol.  I will plan to see her back in 2 weeks and we will advance her active range of motion at that time.  Plan :    -Return to clinic in 2 weeks      I personally saw and evaluated the patient, and participated in the management and treatment plan.  Vanetta Mulders, MD Attending Physician, Orthopedic Surgery  This document was dictated using Dragon voice recognition software. A reasonable attempt at proof reading has been made to minimize errors.

## 2022-01-13 ENCOUNTER — Ambulatory Visit (HOSPITAL_BASED_OUTPATIENT_CLINIC_OR_DEPARTMENT_OTHER): Payer: Medicare HMO | Admitting: Physical Therapy

## 2022-01-13 ENCOUNTER — Telehealth: Payer: Self-pay | Admitting: Orthopaedic Surgery

## 2022-01-13 ENCOUNTER — Encounter (HOSPITAL_BASED_OUTPATIENT_CLINIC_OR_DEPARTMENT_OTHER): Payer: Self-pay | Admitting: Physical Therapy

## 2022-01-13 DIAGNOSIS — M25512 Pain in left shoulder: Secondary | ICD-10-CM | POA: Diagnosis not present

## 2022-01-13 DIAGNOSIS — M6281 Muscle weakness (generalized): Secondary | ICD-10-CM

## 2022-01-13 DIAGNOSIS — M25612 Stiffness of left shoulder, not elsewhere classified: Secondary | ICD-10-CM | POA: Diagnosis not present

## 2022-01-13 DIAGNOSIS — G8929 Other chronic pain: Secondary | ICD-10-CM | POA: Diagnosis not present

## 2022-01-13 NOTE — Therapy (Signed)
OUTPATIENT PHYSICAL THERAPY SHOULDER TREATMENT   Patient Name: Heidi Shah MRN: [id number] DOB:[birth year], 76 y.o., female Today's Date: 01/13/2022   PT End of Session - 01/13/22 1417     Visit Number 4    Number of Visits 25    Date for PT Re-Evaluation 03/05/22    Authorization Type [employer] MCR    PT Start Time 1345    PT Stop Time 1415    PT Time Calculation (min) 30 min    Activity Tolerance Patient tolerated treatment well    Behavior During Therapy WFL for tasks assessed/performed                Past Medical History:  Diagnosis Date   Allergy    Anxiety    Arthritis    Cataract    surgery - Bilateral cataracts removed   Chicken pox    Colon polyps    Depression    Diverticulitis    Diverticulosis    Esophageal stricture    Family history of adverse reaction to anesthesia    difficult time waking mother up   GERD (gastroesophageal reflux disease)    Heart murmur    Hypertension    Past Surgical History:  Procedure Laterality Date   cataract surgery Bilateral 1996   COLONOSCOPY  2022   EYE SURGERY  2000   detached L retina   HERNIA REPAIR Bilateral 1990   inguinal   NECK SURGERY     as a child   POLYPECTOMY  2022   SHOULDER ARTHROSCOPY WITH ROTATOR CUFF REPAIR AND OPEN BICEPS TENODESIS Left 12/10/2021   Procedure: LEFT SHOULDER ARTHROSCOPY WITH ROTATOR CUFF REPAIR AND OPEN BICEPS TENODESIS;  Surgeon: Vanetta Mulders, MD;  Location: [facility];  Service: Orthopedics;  Laterality: Left;   UPPER GASTROINTESTINAL ENDOSCOPY  2022   Patient Active Problem List   Diagnosis Date Noted   Traumatic complete tear of left rotator cuff    Cervical spondylosis with myelopathy and radiculopathy 03/14/2020   Neck pain 03/14/2020   Aortic heart murmur 02/25/2020   Osteoporosis 04/04/2019   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   Unspecified constipation 03/05/2013   GERD (gastroesophageal reflux disease) 01/19/2013   Esophageal spasm 01/19/2013   Dysphagia,  unspecified(787.20) 01/19/2013   Diverticulitis of colon (without mention of hemorrhage)(562.11) 11/23/2012   History of depression 11/23/2012   Essential hypertension, benign 11/23/2012    REFERRING PROVIDER: Vanetta Mulders, MD  REFERRING DIAG: Postop, left shoulder rotator cuff repair PROCEDURE: Arthroscopic limited debridement - 29822 Arthroscopic subacromial decompression - 29826 Arthroscopic rotator cuff repair - 03474 Arthroscopic biceps tenodesis - 25956  THERAPY DIAG:  Acute pain of left shoulder  Stiffness of left shoulder, not elsewhere classified  Muscle weakness (generalized)  Rationale for Evaluation and Treatment Rehabilitation  ONSET DATE: DOS 12/10/21  Days since surgery: 34   SUBJECTIVE:  SUBJECTIVE STATEMENT: Pt states that the shoulder hurts a little today. She feels the biceps is sore. Pt states she is doing HEP up to 3x/day.   PERTINENT HISTORY: H/o neck pain, OP  PAIN:  Are you having pain? No  PRECAUTIONS: Shoulder  WEIGHT BEARING RESTRICTIONS Yes shoulder  FALLS:  Has patient fallen in last 6 months? 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  PLOF: Independent  PATIENT GOALS strengthen arm  OBJECTIVE:   PATIENT SURVEYS:  FOTO 71     TODAY'S TREATMENT:  PROM- flexion to 120, ABD to 90, IR to belly, ER to 79 Joint mob: L GHJ inf glide grade II  Exercises -seated pulley in scaption and ABD 5s hold 2 1.5 min -supine AAROM flexion 5s 10x -table slide ABD 5s 10x -cane ABD 5s 15x  Review of previous HEP     PATIENT EDUCATION: Education details: joint protection, precautions, protocol, cryotherapy, anatomy, exercise progression, DOMS  expectations, HEP, POC  Person educated: Patient and daughter Education method: Explanation, Demonstration, Tactile cues, Verbal cues, and Handouts Education comprehension: verbalized understanding, returned demonstration, verbal cues required, tactile cues required, and needs further education   HOME EXERCISE PROGRAM: [id number]  ASSESSMENT:  CLINICAL IMPRESSION: Pt continues to have well managed pain but does present with increased muscle stiffness at today's session that may be related to pt's report of increased freq with HEP at home. Pt does continue to make improvement in ROM but has stiffness at end range. Pt HEP updated today for more AAROM and advised to keep freq to only as prescribed to prevent excessive muscle soreness/guarding. Plan to continue with ROM as tolerated. Pt would benefit from continued skilled therapy in order to reach goals and maximize functional L UE strength and ROM for full return to PLOF.    OBJECTIVE IMPAIRMENTS decreased activity tolerance, decreased ROM, decreased strength, increased edema, increased muscle spasms, impaired flexibility, impaired UE functional use, improper body mechanics, postural dysfunction, and pain.   ACTIVITY LIMITATIONS carrying, lifting, sleeping, bed mobility, bathing, dressing, reach over head, hygiene/grooming, and caring for others  PARTICIPATION LIMITATIONS: meal prep, cleaning, laundry, driving, shopping, community activity, and occupation  PERSONAL FACTORS 1-2 comorbidities: h/o neck pain, OP  are also affecting patient's functional outcome.   REHAB POTENTIAL: Good  CLINICAL DECISION MAKING: Stable/uncomplicated  EVALUATION COMPLEXITY: Low   GOALS: Goals reviewed with patient? Yes  SHORT TERM GOALS: Target date: 01/08/22  PROM goals met for 4 weeks (140 FF, 40 ER, 60 ABD) Baseline: see obj Goal status: INITIAL  2.  Average pain <=4/10 Baseline: none at rest at eval Goal status: INITIAL    LONG TERM GOALS:  Target date: 03/12/22  Will tolerate beginning light resistance exercises to shoulder Baseline: unable at eval Goal status: INITIAL Target date: 02/04/22 (8 weeks)  2.  Able to be out of her sling during the day, pain <=4/10 Baseline: unable at eval Goal status: INITIAL Target date: 02/04/22 (8 weeks)  3.  Able to dress and fix hair independently and without pain Baseline: unable at eval Goal status: INITIAL Target date: 03/04/22 (12 weeks)  4.  AROM within 5 deg of opp UE Baseline: unable at eval Goal status: INITIAL Target date: 03/04/22 (12 weeks)   PLAN: PT FREQUENCY: 1-2x/week  PT DURATION: 12 weeks  PLANNED INTERVENTIONS: Therapeutic exercises, Therapeutic activity, Neuromuscular re-education, Patient/Family education, Self Care, Joint mobilization, Aquatic Therapy, Dry Needling, Spinal  mobilization, Cryotherapy, Moist heat, Taping, Manual therapy, and Re-evaluation  PLAN FOR NEXT SESSION: continue per protocol  Daleen Bo PT, DPT 01/13/22 2:18 PM

## 2022-01-13 NOTE — Telephone Encounter (Signed)
Note printed and ready for pickup at [facility] location

## 2022-01-19 ENCOUNTER — Telehealth: Payer: Self-pay | Admitting: Family Medicine

## 2022-01-19 NOTE — Telephone Encounter (Signed)
Patient informed of the message and expressed understanding. Patient reported no GERD symptoms at this time

## 2022-01-19 NOTE — Telephone Encounter (Signed)
Pt has had laryngitis for 2 weeks, seeking guidance on OTC remedies. Declined OV

## 2022-01-20 ENCOUNTER — Ambulatory Visit (HOSPITAL_BASED_OUTPATIENT_CLINIC_OR_DEPARTMENT_OTHER): Payer: Medicare HMO | Admitting: Physical Therapy

## 2022-01-20 ENCOUNTER — Encounter (HOSPITAL_BASED_OUTPATIENT_CLINIC_OR_DEPARTMENT_OTHER): Payer: Self-pay | Admitting: Physical Therapy

## 2022-01-20 ENCOUNTER — Ambulatory Visit (INDEPENDENT_AMBULATORY_CARE_PROVIDER_SITE_OTHER): Payer: Medicare HMO | Admitting: Orthopaedic Surgery

## 2022-01-20 DIAGNOSIS — M25612 Stiffness of left shoulder, not elsewhere classified: Secondary | ICD-10-CM | POA: Diagnosis not present

## 2022-01-20 DIAGNOSIS — M25512 Pain in left shoulder: Secondary | ICD-10-CM

## 2022-01-20 DIAGNOSIS — M6281 Muscle weakness (generalized): Secondary | ICD-10-CM | POA: Diagnosis not present

## 2022-01-20 DIAGNOSIS — G8929 Other chronic pain: Secondary | ICD-10-CM | POA: Diagnosis not present

## 2022-01-20 DIAGNOSIS — S46012A Strain of muscle(s) and tendon(s) of the rotator cuff of left shoulder, initial encounter: Secondary | ICD-10-CM

## 2022-01-20 NOTE — Progress Notes (Signed)
Post Operative Evaluation    Procedure/Date of Surgery: 12/10/21 left shoulder rotator cuff repair and biceps tenodesis  Interval History:   Presents today 6 weeks status post the above procedure.  She is overall doing very well.  Time she continues to work with on range of motion.  She does have some stiffness.  She is experiencing some soreness at the end of rehab but overall doing very well.   PMH/PSH/Family History/Social History/Meds/Allergies:    Past Medical History:  Diagnosis Date   Allergy    Anxiety    Arthritis    Cataract    surgery - Bilateral cataracts removed   Chicken pox    Colon polyps    Depression    Diverticulitis    Diverticulosis    Esophageal stricture    Family history of adverse reaction to anesthesia    difficult time waking mother up   GERD (gastroesophageal reflux disease)    Heart murmur    Hypertension    Past Surgical History:  Procedure Laterality Date   cataract surgery Bilateral 1996   COLONOSCOPY  2022   EYE SURGERY  2000   detached L retina   HERNIA REPAIR Bilateral 1990   inguinal   NECK SURGERY     as a child   POLYPECTOMY  2022   SHOULDER ARTHROSCOPY WITH ROTATOR CUFF REPAIR AND OPEN BICEPS TENODESIS Left 12/10/2021   Procedure: LEFT SHOULDER ARTHROSCOPY WITH ROTATOR CUFF REPAIR AND OPEN BICEPS TENODESIS;  Surgeon: Vanetta Mulders, MD;  Location: [facility];  Service: Orthopedics;  Laterality: Left;   UPPER GASTROINTESTINAL ENDOSCOPY  2022   Social History   Socioeconomic History   Marital status: Divorced    Spouse name: Not on file   Number of children: 2   Years of education: master's degree   Highest education level: Master's degree (e.g., MA, MS, MEng, MEd, MSW, MBA)  Occupational History   Occupation: retired Pharmacist, hospital    Comment: part - time Pharmacist, hospital  Tobacco Use   Smoking status: Never   Smokeless tobacco: Never  Vaping Use   Vaping Use: Never used  Substance and Sexual Activity    Alcohol use: Yes    Alcohol/week: 1.0 standard drink of alcohol    Types: 1 Glasses of wine per week    Comment: one or two glasses of wine a couple times a week   Drug use: No   Sexual activity: Not on file  Other Topics Concern   Not on file  Social History Narrative      Lives alone in ranch home.    2 children   Son lives in [place]   Continues to teach virtually part time   Social Determinants of Health   Financial Resource Strain: Low Risk  (05/19/2021)   Overall Financial Resource Strain (CARDIA)    Difficulty of Paying Living Expenses: Not hard at all  Food Insecurity: No Food Insecurity (05/19/2021)   Hunger Vital Sign    Worried About Running Out of Food in the Last Year: Never true    Ran Out of Food in the Last Year: Never true  Transportation Needs: No Transportation Needs (05/19/2021)   PRAPARE - Hydrologist (Medical): No    Lack of Transportation (Non-Medical): No  Physical Activity: Sufficiently Active (05/19/2021)  Exercise Vital Sign    Days of Exercise per Week: 3 days    Minutes of Exercise per Session: 60 min  Stress: No Stress Concern Present (05/19/2021)   [place]    Feeling of Stress : Only a little  Social Connections: Moderately Isolated (05/19/2021)   Social Connection and Isolation Panel [NHANES]    Frequency of Communication with Friends and Family: Once a week    Frequency of Social Gatherings with Friends and Family: Once a week    Attends Religious Services: More than 4 times per year    Active Member of [employer] or Organizations: Yes    Attends Music therapist: More than 4 times per year    Marital Status: Divorced   Family History  Problem Relation Age of Onset   Heart disease Mother 36   COPD Mother    Crohn's disease Mother    Heart disease Father 32   Cancer Father        kidney   Colon cancer Neg Hx    Esophageal  cancer Neg Hx    Stomach cancer Neg Hx    Rectal cancer Neg Hx    Allergies  Allergen Reactions   Beeswax     Per skin test from dermatologist   Cetrimonium Chloride [Cetrimide]     Per skin test from dermatologist   Methylisothiazolinone     Per skin test from dermatologist   Neomycin Sulfate [Neomycin]     Per skin test from dermatologist   Nickel Sulfate [Nickel]     Per skin test from dermatologist   Penicillins     Childhood Reaction    Propolis     Per skin test from dermatologist   Current Outpatient Medications  Medication Sig Dispense Refill   aspirin 325 MG tablet Take 325 mg by mouth daily.     Biotin w/ Vitamins C & E (HAIR SKIN & NAILS GUMMIES PO) Take 2 each by mouth daily.     Calcium Carb-Cholecalciferol (CALCIUM 600+D3 PO) Take 1 tablet by mouth daily.     Cholecalciferol 25 MCG (1000 UT) CHEW Chew 1,000 Units by mouth daily.     cyanocobalamin (VITAMIN B12) 1000 MCG tablet Take 1,000 mcg by mouth daily.     Ginkgo Biloba 60 MG CAPS Take 60 mg by mouth daily.     HYDROcodone bit-homatropine (HYCODAN) 5-1.5 MG/5ML syrup Take 5 mLs by mouth every 6 (six) hours as needed for cough. 120 mL 0   ibuprofen (ADVIL) 200 MG tablet Take 400 mg by mouth every 8 (eight) hours as needed for moderate pain.     lisinopril-hydrochlorothiazide (ZESTORETIC) 20-12.5 MG tablet TAKE ONE TABLET BY MOUTH ONE TIME DAILY 90 tablet 1   loratadine (CLARITIN) 10 MG tablet Take 10 mg by mouth daily.     Misc Natural Products (ELDERBERRY IMMUNE COMPLEX PO) Take 1 each by mouth daily.     Multiple Vitamin (MULTI-VITAMIN) tablet Take 1 tablet by mouth daily.     omeprazole (PRILOSEC) 40 MG capsule TAKE ONE CAPSULE BY MOUTH ONE TIME DAILY 30 capsule 3   Probiotic Product (PROBIOTIC DAILY PO) Take 1 capsule by mouth daily.     triamcinolone cream (KENALOG) 0.1 % Apply 1 Application topically 2 (two) times daily as needed. 30 g 0   vitamin C (ASCORBIC ACID) 500 MG tablet Take 500 mg by mouth  daily.     VYZULTA 0.024 % SOLN Place 1  drop into both eyes at bedtime.     No current facility-administered medications for this visit.   No results found.  Review of Systems:   A ROS was performed including pertinent positives and negatives as documented in the HPI.   Musculoskeletal Exam:    There were no vitals taken for this visit.  Left shoulder incisions are healed.  In the spine position she is able to actively elevate to 110 degrees with external rotation at side 45 degrees.  Internal rotation deferred today.  2+ radial pulse  Imaging:      I personally reviewed and interpreted the radiographs.   Assessment:   6 weeks status post left shoulder rotator cuff repair and biceps tenodesis overall doing very well.  At this time I would like her to aggressively work on passive range of motion as well as this is somewhat limited today.  Passively I am able to get her to approximately 120 degrees although I do believe she should work aggressively on this.  I did specifically show her how to begin this.  She also begin active range of motion per protocol. Plan :    -Return to clinic in 6 weeks      I personally saw and evaluated the patient, and participated in the management and treatment plan.  Vanetta Mulders, MD Attending Physician, Orthopedic Surgery  This document was dictated using Dragon voice recognition software. A reasonable attempt at proof reading has been made to minimize errors.

## 2022-01-20 NOTE — Therapy (Signed)
OUTPATIENT PHYSICAL THERAPY SHOULDER TREATMENT   Patient Name: Heidi Shah MRN: [id number] DOB:1945/09/01, 76 y.o., female Today's Date: 01/20/2022   PT End of Session - 01/20/22 1521     Visit Number 5    Number of Visits 25    Date for PT Re-Evaluation 03/05/22    Authorization Type [employer] M[employer]    PT Start Time 1435    PT Stop Time 1508    PT Time Calculation (min) 33 min    Activity Tolerance Patient tolerated treatment well    Behavior During Therapy WFL for tasks assessed/performed                 Past Medical History:  Diagnosis Date   Allergy    Anxiety    Arthritis    Cataract    surgery - Bilateral cataracts removed   Chicken pox    Colon polyps    Depression    Diverticulitis    Diverticulosis    Esophageal stricture    Family history of adverse reaction to anesthesia    difficult time waking mother up   GERD (gastroesophageal reflux disease)    Heart murmur    Hypertension    Past Surgical History:  Procedure Laterality Date   cataract surgery Bilateral 1996   COLONOSCOPY  2022   EYE SURGERY  2000   detached L retina   HERNIA REPAIR Bilateral 1990   inguinal   NECK SURGERY     as a child   POLYPECTOMY  2022   SHOULDER ARTHROSCOPY WITH ROTATOR CUFF REPAIR AND OPEN BICEPS TENODESIS Left 12/10/2021   Procedure: LEFT SHOULDER ARTHROSCOPY WITH ROTATOR CUFF REPAIR AND OPEN BICEPS TENODESIS;  Surgeon: Vanetta Mulders, MD;  Location: [facility];  Service: Orthopedics;  Laterality: Left;   UPPER GASTROINTESTINAL ENDOSCOPY  2022   Patient Active Problem List   Diagnosis Date Noted   Traumatic complete tear of left rotator cuff    Cervical spondylosis with myelopathy and radiculopathy 03/14/2020   Neck pain 03/14/2020   Aortic heart murmur 02/25/2020   Osteoporosis 04/04/2019   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   Unspecified constipation 03/05/2013   GERD (gastroesophageal reflux disease) 01/19/2013   Esophageal spasm 01/19/2013   Dysphagia,  unspecified(787.20) 01/19/2013   Diverticulitis of colon (without mention of hemorrhage)(562.11) 11/23/2012   History of depression 11/23/2012   Essential hypertension, benign 11/23/2012    REFERRING PROVIDER: Vanetta Mulders, MD  REFERRING DIAG: Postop, left shoulder rotator cuff repair PROCEDURE: Arthroscopic limited debridement - 29822 Arthroscopic subacromial decompression - 29826 Arthroscopic rotator cuff repair - 67209 Arthroscopic biceps tenodesis - 47096  THERAPY DIAG:  Acute pain of left shoulder  Stiffness of left shoulder, not elsewhere classified  Muscle weakness (generalized)  Rationale for Evaluation and Treatment Rehabilitation  ONSET DATE: DOS 12/10/21  Days since surgery: 41   SUBJECTIVE:  SUBJECTIVE STATEMENT: Pt states she now out of the sling. Was lsightly sore after last session. Just came from MD session. She just feels stiff.    PERTINENT HISTORY: H/o neck pain, OP  PAIN:  Are you having pain? No  PRECAUTIONS: Shoulder  WEIGHT BEARING RESTRICTIONS Yes shoulder  FALLS:  Has patient fallen in last 6 months? 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  PLOF: Independent  PATIENT GOALS strengthen arm  OBJECTIVE:   PATIENT SURVEYS:  FOTO 24     TODAY'S TREATMENT:  PROM- flexion to 120, ABD to 90, IR to belly, ER to 40 Joint mob: L GHJ inf glide grade II  Exercises -biceps doorway stretch 30s 3x -seated pulley in scaption and ABD 5s hold 2 1.5 min -supine AAROM flexion 5s 10x -table slide ABD 5s 10x -cane ABD 5s 15x  Review of previous HEP     PATIENT EDUCATION: Education details: joint protection, precautions, protocol, cryotherapy,  anatomy, exercise progression, DOMS expectations, HEP, POC  Person educated: Patient and daughter Education method: Explanation, Demonstration, Tactile cues, Verbal cues, and Handouts Education comprehension: verbalized understanding, returned demonstration, verbal cues required, tactile cues required, and needs further education   HOME EXERCISE PROGRAM: [email or web address]  ASSESSMENT:  CLINICAL IMPRESSION: Pt continues to have end range joint stiffness at today's session. Moderate change made with manual joint mobs- limited by pain. Pt HEP updated today to include more lower load and longer duration stretching in order to progress ABD and flexion ROM. Pt is now out of the sling so advised on lifting precautions but to continue with aggressive ROM to avoid joint contracture. Plan to continue with aggressive ROM per MD order. Pt would benefit from continued skilled therapy in order to reach goals and maximize functional L UE strength and ROM for full return to PLOF.    OBJECTIVE IMPAIRMENTS decreased activity tolerance, decreased ROM, decreased strength, increased edema, increased muscle spasms, impaired flexibility, impaired UE functional use, improper body mechanics, postural dysfunction, and pain.   ACTIVITY LIMITATIONS carrying, lifting, sleeping, bed mobility, bathing, dressing, reach over head, hygiene/grooming, and caring for others  PARTICIPATION LIMITATIONS: meal prep, cleaning, laundry, driving, shopping, community activity, and occupation  PERSONAL FACTORS 1-2 comorbidities: h/o neck pain, OP  are also affecting patient's functional outcome.   REHAB POTENTIAL: Good  CLINICAL DECISION MAKING: Stable/uncomplicated  EVALUATION COMPLEXITY: Low   GOALS: Goals reviewed with patient? Yes  SHORT TERM GOALS: Target date: 01/08/22  PROM goals met for 4 weeks (140 FF, 40 ER, 60 ABD) Baseline: see obj Goal status: INITIAL  2.  Average pain <=4/10 Baseline: none at rest at eval Goal  status: INITIAL    LONG TERM GOALS: Target date: 03/12/22  Will tolerate beginning light resistance exercises to shoulder Baseline: unable at eval Goal status: INITIAL Target date: 02/04/22 (8 weeks)  2.  Able to be out of her sling during the day, pain <=4/10 Baseline: unable at eval Goal status: INITIAL Target date: 02/04/22 (8 weeks)  3.  Able to dress and fix hair independently and without pain Baseline: unable at eval Goal status: INITIAL Target date: 03/04/22 (12 weeks)  4.  AROM within 5 deg of opp UE Baseline: unable at eval Goal status: INITIAL Target date: 03/04/22 (12 weeks)   PLAN: PT FREQUENCY: 1-2x/week  PT DURATION: 12 weeks  PLANNED INTERVENTIONS: Therapeutic exercises, Therapeutic activity, Neuromuscular re-education, Patient/Family education, Self Care, Joint mobilization,  Aquatic Therapy, Dry Needling, Spinal mobilization, Cryotherapy, Moist heat, Taping, Manual therapy, and Re-evaluation  PLAN FOR NEXT SESSION: continue per protocol, aggressive joint ROM and mobilizations  Daleen Bo PT, DPT 01/20/22 3:24 PM

## 2022-01-21 ENCOUNTER — Encounter (HOSPITAL_BASED_OUTPATIENT_CLINIC_OR_DEPARTMENT_OTHER): Payer: Medicare HMO | Admitting: Orthopaedic Surgery

## 2022-01-25 ENCOUNTER — Ambulatory Visit (INDEPENDENT_AMBULATORY_CARE_PROVIDER_SITE_OTHER): Payer: Medicare HMO | Admitting: Family Medicine

## 2022-01-25 ENCOUNTER — Encounter: Payer: Self-pay | Admitting: Family Medicine

## 2022-01-25 VITALS — BP 120/74 | HR 105 | Temp 97.8°F | Ht 60.0 in | Wt 115.8 lb

## 2022-01-25 DIAGNOSIS — J040 Acute laryngitis: Secondary | ICD-10-CM

## 2022-01-25 NOTE — Progress Notes (Signed)
Established Patient Office Visit  Subjective   Patient ID: Heidi Shah, female    DOB: 1945-06-02  Age: 76 y.o. MRN: [id number]  Chief Complaint  Patient presents with   Follow-up    HPI   Heidi Shah is seen with laryngitis symptoms.  She had recent left shoulder surgery and is recovering from that.  She had fairly severe bronchial illness and ended up going to the ER twice and had 2 x-rays that showed no pneumonia.  She is finally over cough but has had now about 2 and half weeks of laryngitis.  No facial pain.  No headaches.  No fever.  No active GERD symptoms.  She knows she may not be resting her voice adequately  Past Medical History:  Diagnosis Date   Allergy    Anxiety    Arthritis    Cataract    surgery - Bilateral cataracts removed   Chicken pox    Colon polyps    Depression    Diverticulitis    Diverticulosis    Esophageal stricture    Family history of adverse reaction to anesthesia    difficult time waking mother up   GERD (gastroesophageal reflux disease)    Heart murmur    Hypertension    Past Surgical History:  Procedure Laterality Date   cataract surgery Bilateral 1996   COLONOSCOPY  2022   EYE SURGERY  2000   detached L retina   HERNIA REPAIR Bilateral 1990   inguinal   NECK SURGERY     as a child   POLYPECTOMY  2022   SHOULDER ARTHROSCOPY WITH ROTATOR CUFF REPAIR AND OPEN BICEPS TENODESIS Left 12/10/2021   Procedure: LEFT SHOULDER ARTHROSCOPY WITH ROTATOR CUFF REPAIR AND OPEN BICEPS TENODESIS;  Surgeon: Vanetta Mulders, MD;  Location: [facility];  Service: Orthopedics;  Laterality: Left;   UPPER GASTROINTESTINAL ENDOSCOPY  2022    reports that she has never smoked. She has never used smokeless tobacco. She reports current alcohol use of about 1.0 standard drink of alcohol per week. She reports that she does not use drugs. family history includes COPD in her mother; Cancer in her father; Crohn's disease in her mother; Heart disease (age of onset: 44)  in her mother; Heart disease (age of onset: 17) in her father. Allergies  Allergen Reactions   Beeswax     Per skin test from dermatologist   Cetrimonium Chloride [Cetrimide]     Per skin test from dermatologist   Methylisothiazolinone     Per skin test from dermatologist   Neomycin Sulfate [Neomycin]     Per skin test from dermatologist   Nickel Sulfate [Nickel]     Per skin test from dermatologist   Penicillins     Childhood Reaction    Propolis     Per skin test from dermatologist    Review of Systems  Constitutional:  Negative for chills and fever.  HENT:  Negative for sore throat.   Respiratory:  Negative for cough and hemoptysis.       Objective:     BP 120/74 (BP Location: Left Arm, Patient Position: Sitting, Cuff Size: Normal)   Pulse (!) 105   Temp 97.8 F (36.6 C) (Oral)   Ht 5' (1.524 m)   Wt 115 lb 12.8 oz (52.5 kg)   SpO2 99%   BMI 22.62 kg/m    Physical Exam Vitals reviewed.  Constitutional:      Appearance: Normal appearance.  HENT:     Right  Ear: Tympanic membrane normal.     Left Ear: Tympanic membrane normal.  Cardiovascular:     Rate and Rhythm: Normal rate and regular rhythm.  Pulmonary:     Effort: Pulmonary effort is normal.     Breath sounds: No wheezing or rales.  Musculoskeletal:     Cervical back: Neck supple.  Lymphadenopathy:     Cervical: No cervical adenopathy.  Neurological:     Mental Status: She is alert.      No results found for any visits on 01/25/22.    The 10-year ASCVD risk score (Arnett DK, et al., 2019) is: 15.5%    Assessment & Plan:   Laryngitis.  Follows likely recent viral respiratory illness.  Suspect this is viral.  She is a non-smoker.  No recent active GERD symptoms.  No chronic sinusitis symptoms.  -Voice rest -Plenty of fluids -Follow-up/touch base in a couple weeks if not resolving  No follow-ups on file.    Carolann Littler, MD

## 2022-01-25 NOTE — Patient Instructions (Signed)
Try to rest voice as much as possible!  Sip hot liquids  Let me know in 1-2 weeks if voice not improving.

## 2022-01-26 ENCOUNTER — Ambulatory Visit (HOSPITAL_BASED_OUTPATIENT_CLINIC_OR_DEPARTMENT_OTHER): Payer: Medicare HMO | Admitting: Physical Therapy

## 2022-01-26 ENCOUNTER — Encounter (HOSPITAL_BASED_OUTPATIENT_CLINIC_OR_DEPARTMENT_OTHER): Payer: Self-pay | Admitting: Physical Therapy

## 2022-01-26 DIAGNOSIS — M6281 Muscle weakness (generalized): Secondary | ICD-10-CM | POA: Diagnosis not present

## 2022-01-26 DIAGNOSIS — M25612 Stiffness of left shoulder, not elsewhere classified: Secondary | ICD-10-CM

## 2022-01-26 DIAGNOSIS — M25512 Pain in left shoulder: Secondary | ICD-10-CM

## 2022-01-26 DIAGNOSIS — G8929 Other chronic pain: Secondary | ICD-10-CM | POA: Diagnosis not present

## 2022-01-26 NOTE — Therapy (Signed)
OUTPATIENT PHYSICAL THERAPY SHOULDER TREATMENT   Patient Name: Heidi Shah MRN: [id number] DOB:16-Dec-1945, 76 y.o., female Today's Date: 01/26/2022   PT End of Session - 01/26/22 1412     Visit Number 6    Number of Visits 25    Date for PT Re-Evaluation 03/05/22    Authorization Type [employer] M[employer]    PT Start Time 1345    PT Stop Time 8527    PT Time Calculation (min) 30 min    Activity Tolerance Patient tolerated treatment well    Behavior During Therapy WFL for tasks assessed/performed                  Past Medical History:  Diagnosis Date   Allergy    Anxiety    Arthritis    Cataract    surgery - Bilateral cataracts removed   Chicken pox    Colon polyps    Depression    Diverticulitis    Diverticulosis    Esophageal stricture    Family history of adverse reaction to anesthesia    difficult time waking mother up   GERD (gastroesophageal reflux disease)    Heart murmur    Hypertension    Past Surgical History:  Procedure Laterality Date   cataract surgery Bilateral 1996   COLONOSCOPY  2022   EYE SURGERY  2000   detached L retina   HERNIA REPAIR Bilateral 1990   inguinal   NECK SURGERY     as a child   POLYPECTOMY  2022   SHOULDER ARTHROSCOPY WITH ROTATOR CUFF REPAIR AND OPEN BICEPS TENODESIS Left 12/10/2021   Procedure: LEFT SHOULDER ARTHROSCOPY WITH ROTATOR CUFF REPAIR AND OPEN BICEPS TENODESIS;  Surgeon: Vanetta Mulders, MD;  Location: [facility];  Service: Orthopedics;  Laterality: Left;   UPPER GASTROINTESTINAL ENDOSCOPY  2022   Patient Active Problem List   Diagnosis Date Noted   Traumatic complete tear of left rotator cuff    Cervical spondylosis with myelopathy and radiculopathy 03/14/2020   Neck pain 03/14/2020   Aortic heart murmur 02/25/2020   Osteoporosis 04/04/2019   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   Unspecified constipation 03/05/2013   GERD (gastroesophageal reflux disease) 01/19/2013   Esophageal spasm 01/19/2013   Dysphagia,  unspecified(787.20) 01/19/2013   Diverticulitis of colon (without mention of hemorrhage)(562.11) 11/23/2012   History of depression 11/23/2012   Essential hypertension, benign 11/23/2012    REFERRING PROVIDER: Vanetta Mulders, MD  REFERRING DIAG: Postop, left shoulder rotator cuff repair PROCEDURE: Arthroscopic limited debridement - 29822 Arthroscopic subacromial decompression - 29826 Arthroscopic rotator cuff repair - 78242 Arthroscopic biceps tenodesis - 35361  THERAPY DIAG:  Acute pain of left shoulder  Stiffness of left shoulder, not elsewhere classified  Muscle weakness (generalized)  Rationale for Evaluation and Treatment Rehabilitation  ONSET DATE: DOS 12/10/21  Days since surgery: 47   SUBJECTIVE:  SUBJECTIVE STATEMENT: Pt states she is feeling like it is stretching more. Moving it certain ways that causes pulling.   PERTINENT HISTORY: H/o neck pain, OP  PAIN:  Are you having pain? No  PRECAUTIONS: Shoulder  WEIGHT BEARING RESTRICTIONS Yes shoulder  FALLS:  Has patient fallen in last 6 months? 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  PLOF: Independent  PATIENT GOALS strengthen arm  OBJECTIVE:   PATIENT SURVEYS:  FOTO 3     TODAY'S TREATMENT:   PROM- flexion to 130 ABD to 100, IR to belly, ER to 40  Joint mob: L GHJ inf glide grade III  Exercises -cane ext 5s 10x -IR strap stretch 5s 10x -wall slide 2x10 flexion -scaption 2x10  Review of previous HEP     PATIENT EDUCATION: Education details: anatomy, exercise progression, DOMS expectations, HEP, POC  Person educated: Patient and daughter Education method: Explanation, Demonstration, Tactile  cues, Verbal cues, and Handouts Education comprehension: verbalized understanding, returned demonstration, verbal cues required, tactile cues required, and needs further education   HOME EXERCISE PROGRAM: [id number]  ASSESSMENT:  CLINICAL IMPRESSION: Pt demos improvement in OH ROM today and able to progress to active motion, especially in flexion. Pt is still stiff into abduction with slower progress. Pt was able to add in extension, IR, and AROM home. Pt advised that if shoulder irritation starts to reduce frequency of new AROM.  Plan to continue with aggressive ROM as tolerated. Pt would benefit from continued skilled therapy in order to reach goals and maximize functional L UE strength and ROM for full return to PLOF.    OBJECTIVE IMPAIRMENTS decreased activity tolerance, decreased ROM, decreased strength, increased edema, increased muscle spasms, impaired flexibility, impaired UE functional use, improper body mechanics, postural dysfunction, and pain.   ACTIVITY LIMITATIONS carrying, lifting, sleeping, bed mobility, bathing, dressing, reach over head, hygiene/grooming, and caring for others  PARTICIPATION LIMITATIONS: meal prep, cleaning, laundry, driving, shopping, community activity, and occupation  PERSONAL FACTORS 1-2 comorbidities: h/o neck pain, OP  are also affecting patient's functional outcome.   REHAB POTENTIAL: Good  CLINICAL DECISION MAKING: Stable/uncomplicated  EVALUATION COMPLEXITY: Low   GOALS: Goals reviewed with patient? Yes  SHORT TERM GOALS: Target date: 01/08/22  PROM goals met for 4 weeks (140 FF, 40 ER, 60 ABD) Baseline: see obj Goal status: INITIAL  2.  Average pain <=4/10 Baseline: none at rest at eval Goal status: INITIAL    LONG TERM GOALS: Target date: 03/12/22  Will tolerate beginning light resistance exercises to shoulder Baseline: unable at eval Goal status: INITIAL Target date: 02/04/22 (8 weeks)  2.  Able to be out of her sling  during the day, pain <=4/10 Baseline: unable at eval Goal status: INITIAL Target date: 02/04/22 (8 weeks)  3.  Able to dress and fix hair independently and without pain Baseline: unable at eval Goal status: INITIAL Target date: 03/04/22 (12 weeks)  4.  AROM within 5 deg of opp UE Baseline: unable at eval Goal status: INITIAL Target date: 03/04/22 (12 weeks)   PLAN: PT FREQUENCY: 1-2x/week  PT DURATION: 12 weeks  PLANNED INTERVENTIONS: Therapeutic exercises, Therapeutic activity, Neuromuscular re-education, Patient/Family education, Self Care, Joint mobilization, Aquatic Therapy, Dry Needling, Spinal mobilization, Cryotherapy, Moist heat, Taping, Manual therapy, and Re-evaluation  PLAN FOR NEXT SESSION: continue per protocol, aggressive joint ROM and mobilizations  Daleen Bo PT, DPT 01/26/22 2:22 PM

## 2022-01-28 ENCOUNTER — Ambulatory Visit (HOSPITAL_BASED_OUTPATIENT_CLINIC_OR_DEPARTMENT_OTHER): Payer: Medicare HMO | Attending: Family Medicine | Admitting: Physical Therapy

## 2022-01-28 ENCOUNTER — Encounter (HOSPITAL_BASED_OUTPATIENT_CLINIC_OR_DEPARTMENT_OTHER): Payer: Self-pay | Admitting: Physical Therapy

## 2022-01-28 DIAGNOSIS — M25512 Pain in left shoulder: Secondary | ICD-10-CM | POA: Insufficient documentation

## 2022-01-28 DIAGNOSIS — M25612 Stiffness of left shoulder, not elsewhere classified: Secondary | ICD-10-CM | POA: Diagnosis not present

## 2022-01-28 DIAGNOSIS — M6281 Muscle weakness (generalized): Secondary | ICD-10-CM | POA: Diagnosis not present

## 2022-01-28 NOTE — Therapy (Signed)
OUTPATIENT PHYSICAL THERAPY SHOULDER TREATMENT   Patient Name: Heidi Shah MRN: [id number] DOB:07/08/1945, 76 y.o., female Today's Date: 01/28/2022   PT End of Session - 01/28/22 1441     Visit Number 7    Number of Visits 25    Date for PT Re-Evaluation 03/05/22    Authorization Type [employer] M[employer]    PT Start Time 1430    PT Stop Time 1503    PT Time Calculation (min) 33 min    Activity Tolerance Patient tolerated treatment well    Behavior During Therapy WFL for tasks assessed/performed                   Past Medical History:  Diagnosis Date   Allergy    Anxiety    Arthritis    Cataract    surgery - Bilateral cataracts removed   Chicken pox    Colon polyps    Depression    Diverticulitis    Diverticulosis    Esophageal stricture    Family history of adverse reaction to anesthesia    difficult time waking mother up   GERD (gastroesophageal reflux disease)    Heart murmur    Hypertension    Past Surgical History:  Procedure Laterality Date   cataract surgery Bilateral 1996   COLONOSCOPY  2022   EYE SURGERY  2000   detached L retina   HERNIA REPAIR Bilateral 1990   inguinal   NECK SURGERY     as a child   POLYPECTOMY  2022   SHOULDER ARTHROSCOPY WITH ROTATOR CUFF REPAIR AND OPEN BICEPS TENODESIS Left 12/10/2021   Procedure: LEFT SHOULDER ARTHROSCOPY WITH ROTATOR CUFF REPAIR AND OPEN BICEPS TENODESIS;  Surgeon: Vanetta Mulders, MD;  Location: [facility];  Service: Orthopedics;  Laterality: Left;   UPPER GASTROINTESTINAL ENDOSCOPY  2022   Patient Active Problem List   Diagnosis Date Noted   Traumatic complete tear of left rotator cuff    Cervical spondylosis with myelopathy and radiculopathy 03/14/2020   Neck pain 03/14/2020   Aortic heart murmur 02/25/2020   Osteoporosis 04/04/2019   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   Unspecified constipation 03/05/2013   GERD (gastroesophageal reflux disease) 01/19/2013   Esophageal spasm 01/19/2013   Dysphagia,  unspecified(787.20) 01/19/2013   Diverticulitis of colon (without mention of hemorrhage)(562.11) 11/23/2012   History of depression 11/23/2012   Essential hypertension, benign 11/23/2012    REFERRING PROVIDER: Vanetta Mulders, MD  REFERRING DIAG: Postop, left shoulder rotator cuff repair PROCEDURE: Arthroscopic limited debridement - 29822 Arthroscopic subacromial decompression - 29826 Arthroscopic rotator cuff repair - 85277 Arthroscopic biceps tenodesis - 82423  THERAPY DIAG:  Acute pain of left shoulder  Stiffness of left shoulder, not elsewhere classified  Muscle weakness (generalized)  Rationale for Evaluation and Treatment Rehabilitation  ONSET DATE: DOS 12/10/21  Days since surgery: 23   SUBJECTIVE:  SUBJECTIVE STATEMENT: Pt states the shoulder is a little sore from the new exercises but no increase in pain. Pt at 7 wks today.   PERTINENT HISTORY: H/o neck pain, OP  PAIN:  Are you having pain? No  PRECAUTIONS: Shoulder  WEIGHT BEARING RESTRICTIONS Yes shoulder  FALLS:  Has patient fallen in last 6 months? 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  PLOF: Independent  PATIENT GOALS strengthen arm  OBJECTIVE:   PATIENT SURVEYS:  FOTO 9     TODAY'S TREATMENT:   PROM- flexion to 135 ABD to 105, IR to belly, ER to 40  Joint mob: L GHJ inf and posterior  glide grade III   Exercises -supine protraction 3x10 -S/L ER 4x8 -UT stretch on L 30s 2x -wall slide 2x10 ABD 3x6 3s  -doorway pec stretch 30s 2x hand low -YTB row 3x8 -table foam roller flexion 1x10 3s hold -scaption 2x10  Review of previous HEP     PATIENT EDUCATION: Education details: anatomy,  exercise progression, DOMS expectations, HEP, POC  Person educated: Patient and daughter Education method: Explanation, Demonstration, Tactile cues, Verbal cues, and Handouts Education comprehension: verbalized understanding, returned demonstration, verbal cues required, tactile cues required, and needs further education   HOME EXERCISE PROGRAM: [id number]  ASSESSMENT:  CLINICAL IMPRESSION: Pt able to continue with AROM today's session with continued gain into flexion and ABD in gravity eliminated position. Pt able to start light muscle activation/strengthening exercise today without pain. HEP not updated at this time given recent change. Plan to update with strengthening and motor control at next as pt will be near 8 wks at that time. Pt is beginning to make good progress with ROM. Plan to continue with aggressive ROM as tolerated. Pt would benefit from continued skilled therapy in order to reach goals and maximize functional L UE strength and ROM for full return to PLOF.    OBJECTIVE IMPAIRMENTS decreased activity tolerance, decreased ROM, decreased strength, increased edema, increased muscle spasms, impaired flexibility, impaired UE functional use, improper body mechanics, postural dysfunction, and pain.   ACTIVITY LIMITATIONS carrying, lifting, sleeping, bed mobility, bathing, dressing, reach over head, hygiene/grooming, and caring for others  PARTICIPATION LIMITATIONS: meal prep, cleaning, laundry, driving, shopping, community activity, and occupation  PERSONAL FACTORS 1-2 comorbidities: h/o neck pain, OP  are also affecting patient's functional outcome.   REHAB POTENTIAL: Good  CLINICAL DECISION MAKING: Stable/uncomplicated  EVALUATION COMPLEXITY: Low   GOALS: Goals reviewed with patient? Yes  SHORT TERM GOALS: Target date: 01/08/22  PROM goals met for 4 weeks (140 FF, 40 ER, 60 ABD) Baseline: see obj Goal status: INITIAL  2.  Average pain <=4/10 Baseline: none at rest at  eval Goal status: INITIAL    LONG TERM GOALS: Target date: 03/12/22  Will tolerate beginning light resistance exercises to shoulder Baseline: unable at eval Goal status: INITIAL Target date: 02/04/22 (8 weeks)  2.  Able to be out of her sling during the day, pain <=4/10 Baseline: unable at eval Goal status: INITIAL Target date: 02/04/22 (8 weeks)  3.  Able to dress and fix hair independently and without pain Baseline: unable at eval Goal status: INITIAL Target date: 03/04/22 (12 weeks)  4.  AROM within 5 deg of opp UE Baseline: unable at eval Goal status: INITIAL Target date: 03/04/22 (12 weeks)   PLAN: PT FREQUENCY: 1-2x/week  PT DURATION: 12 weeks  PLANNED INTERVENTIONS: Therapeutic exercises,  Therapeutic activity, Neuromuscular re-education, Patient/Family education, Self Care, Joint mobilization, Aquatic Therapy, Dry Needling, Spinal mobilization, Cryotherapy, Moist heat, Taping, Manual therapy, and Re-evaluation  PLAN FOR NEXT SESSION: continue per protocol, aggressive joint ROM and mobilizations, add strength to home  Daleen Bo PT, DPT 01/28/22 3:11 PM

## 2022-02-02 ENCOUNTER — Ambulatory Visit (HOSPITAL_BASED_OUTPATIENT_CLINIC_OR_DEPARTMENT_OTHER): Payer: Medicare HMO | Admitting: Physical Therapy

## 2022-02-02 ENCOUNTER — Encounter (HOSPITAL_BASED_OUTPATIENT_CLINIC_OR_DEPARTMENT_OTHER): Payer: Self-pay | Admitting: Physical Therapy

## 2022-02-02 DIAGNOSIS — M6281 Muscle weakness (generalized): Secondary | ICD-10-CM

## 2022-02-02 DIAGNOSIS — M25512 Pain in left shoulder: Secondary | ICD-10-CM | POA: Diagnosis not present

## 2022-02-02 DIAGNOSIS — M25612 Stiffness of left shoulder, not elsewhere classified: Secondary | ICD-10-CM | POA: Diagnosis not present

## 2022-02-02 NOTE — Therapy (Signed)
OUTPATIENT PHYSICAL THERAPY SHOULDER TREATMENT   Patient Name: ZILPHA MCANDREW MRN: [id number] DOB:[birth year], 76 y.o., female Today's Date: 02/02/2022   PT End of Session - 02/02/22 0926     Visit Number 8    Number of Visits 25    Date for PT Re-Evaluation 03/05/22    Authorization Type [employer] MCR    PT Start Time 0930    PT Stop Time 1009    PT Time Calculation (min) 39 min    Activity Tolerance Patient tolerated treatment well    Behavior During Therapy WFL for tasks assessed/performed                   Past Medical History:  Diagnosis Date   Allergy    Anxiety    Arthritis    Cataract    surgery - Bilateral cataracts removed   Chicken pox    Colon polyps    Depression    Diverticulitis    Diverticulosis    Esophageal stricture    Family history of adverse reaction to anesthesia    difficult time waking mother up   GERD (gastroesophageal reflux disease)    Heart murmur    Hypertension    Past Surgical History:  Procedure Laterality Date   cataract surgery Bilateral 1996   COLONOSCOPY  2022   EYE SURGERY  2000   detached L retina   HERNIA REPAIR Bilateral 1990   inguinal   NECK SURGERY     as a child   POLYPECTOMY  2022   SHOULDER ARTHROSCOPY WITH ROTATOR CUFF REPAIR AND OPEN BICEPS TENODESIS Left 12/10/2021   Procedure: LEFT SHOULDER ARTHROSCOPY WITH ROTATOR CUFF REPAIR AND OPEN BICEPS TENODESIS;  Surgeon: Vanetta Mulders, MD;  Location: [facility];  Service: Orthopedics;  Laterality: Left;   UPPER GASTROINTESTINAL ENDOSCOPY  2022   Patient Active Problem List   Diagnosis Date Noted   Traumatic complete tear of left rotator cuff    Cervical spondylosis with myelopathy and radiculopathy 03/14/2020   Neck pain 03/14/2020   Aortic heart murmur 02/25/2020   Osteoporosis 04/04/2019   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   Unspecified constipation 03/05/2013   GERD (gastroesophageal reflux disease) 01/19/2013   Esophageal spasm 01/19/2013   Dysphagia,  unspecified(787.20) 01/19/2013   Diverticulitis of colon (without mention of hemorrhage)(562.11) 11/23/2012   History of depression 11/23/2012   Essential hypertension, benign 11/23/2012    REFERRING PROVIDER: Vanetta Mulders, MD  REFERRING DIAG: Postop, left shoulder rotator cuff repair PROCEDURE: Arthroscopic limited debridement - 29822 Arthroscopic subacromial decompression - 29826 Arthroscopic rotator cuff repair - 70177 Arthroscopic biceps tenodesis - 93903  THERAPY DIAG:  Acute pain of left shoulder  Stiffness of left shoulder, not elsewhere classified  Muscle weakness (generalized)  Rationale for Evaluation and Treatment Rehabilitation  ONSET DATE: DOS 12/10/21  Days since surgery: 54   SUBJECTIVE:  SUBJECTIVE STATEMENT: I was really sore _0 /7/23:  MANUAL: STM Lt subscap with lateral scapular distraction paired with flexion stretching; flexion stretch with scapular block; end range  abd Inf mobs Supine flexion with wand Sidelying abd & flexion with cues for scap control Standing scaption in mirror with scapular awareness    PATIENT EDUCATION: Education details: anatomy, exercise progression, DOMS expectations, HEP, POC  Person educated: Patient and daughter Education method: Explanation, Demonstration, Tactile cues, Verbal cues, and Handouts Education comprehension: verbalized understanding, returned demonstration, verbal cues required, tactile cues required, and needs further education   HOME EXERCISE PROGRAM: [id number]  ASSESSMENT:  CLINICAL IMPRESSION: Days since surgery: 54  ROM progressing well, capsular end feel at 90 abd and stretchy at flexion end range. Trigger point in subscap will  require more attention.    OBJECTIVE IMPAIRMENTS decreased activity tolerance, decreased ROM, decreased strength, increased edema, increased muscle spasms, impaired flexibility, impaired UE functional use, improper body mechanics, postural dysfunction, and pain.   ACTIVITY LIMITATIONS carrying, lifting, sleeping, bed mobility, bathing, dressing, reach over head, hygiene/grooming, and caring for others  PARTICIPATION LIMITATIONS: meal prep, cleaning, laundry, driving, shopping, community activity, and occupation  PERSONAL FACTORS 1-2 comorbidities: h/o neck pain, OP  are also affecting patient's functional outcome.   REHAB POTENTIAL: Good  CLINICAL DECISION MAKING: Stable/uncomplicated  EVALUATION COMPLEXITY: Low   GOALS: Goals reviewed with patient? Yes  SHORT TERM GOALS: Target date: 01/08/22  PROM goals met for 4 weeks (140 FF, 40 ER, 60 ABD) Baseline: see obj Goal status: ongoing  2.  Average pain <=4/10 Baseline: none at rest at eval Goal status: achieved    LONG TERM GOALS: Target date: 03/12/22  Will tolerate beginning light resistance exercises to shoulder Baseline: unable at eval Goal status: INITIAL Target date: 02/04/22 (8 weeks)  2.   Able to be out of her sling during the day, pain <=4/10 Baseline: unable at eval Goal status: INITIAL Target date: 02/04/22 (8 weeks)  3.  Able to dress and fix hair independently and without pain Baseline: unable at eval Goal status: INITIAL Target date: 03/04/22 (12 weeks)  4.  AROM within 5 deg of opp UE Baseline: unable at eval Goal status: INITIAL Target date: 03/04/22 (12 weeks)   PLAN: PT FREQUENCY: 1-2x/week  PT DURATION: 12 weeks  PLANNED INTERVENTIONS: Therapeutic exercises, Therapeutic activity, Neuromuscular re-education, Patient/Family education, Self Care, Joint mobilization, Aquatic Therapy, Dry Needling, Spinal mobilization, Cryotherapy, Moist heat, Taping, Manual therapy, and Re-evaluation  PLAN  FOR NEXT SESSION: continue per protocol, aggressive joint ROM and mobilizations  Michaelyn Wall C. Citlally Captain PT, DPT 02/02/22 10:10 AM

## 2022-02-04 ENCOUNTER — Ambulatory Visit (HOSPITAL_BASED_OUTPATIENT_CLINIC_OR_DEPARTMENT_OTHER): Payer: Medicare HMO | Admitting: Physical Therapy

## 2022-02-04 ENCOUNTER — Encounter (HOSPITAL_BASED_OUTPATIENT_CLINIC_OR_DEPARTMENT_OTHER): Payer: Self-pay | Admitting: Physical Therapy

## 2022-02-04 DIAGNOSIS — M6281 Muscle weakness (generalized): Secondary | ICD-10-CM

## 2022-02-04 DIAGNOSIS — M25612 Stiffness of left shoulder, not elsewhere classified: Secondary | ICD-10-CM

## 2022-02-04 DIAGNOSIS — M25512 Pain in left shoulder: Secondary | ICD-10-CM

## 2022-02-04 NOTE — Therapy (Signed)
OUTPATIENT PHYSICAL THERAPY SHOULDER TREATMENT   Patient Name: Heidi Shah MRN: [id number] DOB:08/23/1945, 76 y.o., female Today's Date: 02/04/2022   PT End of Session - 02/04/22 1428     Visit Number 9    Number of Visits 25    Date for PT Re-Evaluation 03/05/22    Authorization Type [employer] M[employer]    PT Start Time 1430    PT Stop Time 1510    PT Time Calculation (min) 40 min    Activity Tolerance Patient tolerated treatment well    Behavior During Therapy WFL for tasks assessed/performed                   Past Medical History:  Diagnosis Date   Allergy    Anxiety    Arthritis    Cataract    surgery - Bilateral cataracts removed   Chicken pox    Colon polyps    Depression    Diverticulitis    Diverticulosis    Esophageal stricture    Family history of adverse reaction to anesthesia    difficult time waking mother up   GERD (gastroesophageal reflux disease)    Heart murmur    Hypertension    Past Surgical History:  Procedure Laterality Date   cataract surgery Bilateral 1996   COLONOSCOPY  2022   EYE SURGERY  2000   detached L retina   HERNIA REPAIR Bilateral 1990   inguinal   NECK SURGERY     as a child   POLYPECTOMY  2022   SHOULDER ARTHROSCOPY WITH ROTATOR CUFF REPAIR AND OPEN BICEPS TENODESIS Left 12/10/2021   Procedure: LEFT SHOULDER ARTHROSCOPY WITH ROTATOR CUFF REPAIR AND OPEN BICEPS TENODESIS;  Surgeon: Bokshan, Steven, MD;  Location: [facility];  Service: Orthopedics;  Laterality: Left;   UPPER GASTROINTESTINAL ENDOSCOPY  2022   Patient Active Problem List   Diagnosis Date Noted   Traumatic complete tear of left rotator cuff    Cervical spondylosis with myelopathy and radiculopathy 03/14/2020   Neck pain 03/14/2020   Aortic heart murmur 02/25/2020   Osteoporosis 04/04/2019   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   Unspecified constipation 03/05/2013   GERD (gastroesophageal reflux disease) 01/19/2013   Esophageal spasm 01/19/2013   Dysphagia,  unspecified(787.20) 01/19/2013   Diverticulitis of colon (without mention of hemorrhage)(562.11) 11/23/2012   History of depression 11/23/2012   Essential hypertension, benign 11/23/2012    REFERRING PROVIDER: Bokshan, Steven, MD  REFERRING DIAG: Postop, left shoulder rotator cuff repair PROCEDURE: Arthroscopic limited debridement - 29822 Arthroscopic subacromial decompression - 29826 Arthroscopic rotator cuff repair - 29827 Arthroscopic biceps tenodesis - 29828  THERAPY DIAG:  Acute pain of left shoulder  Stiffness of left shoulder, not elsewhere classified  Muscle weakness (generalized)  Rationale for Evaluation and Treatment Rehabilitation  ONSET DATE: DOS 12/10/21  Days since surgery: 56   SUBJECTIVE:                                                                                                                                                                                        SUBJECTIVE STATEMENT: I was really sore _0 /7/23:  MANUAL: STM Lt subscap with lateral scapular distraction paired with flexion stretching; flexion stretch with scapular block; end range abd Inf mobs Supine flexion with wand Sidelying abd & flexion with cues for scap control Standing scaption in mirror with scapular awareness    PATIENT EDUCATION: Education details: anatomy, exercise progression, DOMS expectations, HEP, POC  Person educated: Patient and daughter Education method: Explanation, Demonstration, Tactile cues, Verbal cues, and Handouts Education comprehension: verbalized understanding, returned demonstration, verbal cues required, tactile cues required, and needs further education   HOME EXERCISE PROGRAM: [id number]  ASSESSMENT:  CLINICAL IMPRESSION: Days since surgery: 56  Pt at 8 wks today. Pt able to continue with progression of shoulder ROM and strengthening at today's session. Pt with AROM ABD to 90 but AA/PROM to 140 suggesting more weakness related deficits with reaching. Pt with improvement in soft tissue extensibility following session. Plan to continue with strength and end range ROM as able. Pt would benefit from continued skilled therapy in order to reach goals and maximize functional L UE strength and ROM for full return to PLOF.    OBJECTIVE IMPAIRMENTS decreased activity tolerance, decreased ROM, decreased strength, increased edema, increased muscle spasms, impaired flexibility, impaired UE functional use, improper body mechanics, postural dysfunction, and pain.   ACTIVITY LIMITATIONS carrying, lifting, sleeping, bed mobility, bathing, dressing, reach over head, hygiene/grooming, and caring for others  PARTICIPATION  LIMITATIONS: meal prep, cleaning, laundry, driving, shopping, community activity, and occupation  PERSONAL FACTORS 1-2 comorbidities: h/o neck pain, OP  are also affecting patient's functional outcome.   REHAB POTENTIAL: Good  CLINICAL DECISION MAKING: Stable/uncomplicated  EVALUATION COMPLEXITY: Low   GOALS: Goals reviewed with patient? Yes  SHORT TERM GOALS: Target date: 01/08/22  PROM goals met for 4 weeks (140 FF, 40 ER, 60 ABD) Baseline: see obj Goal status:  ongoing  2.  Average pain <=4/10 Baseline: none at rest at eval Goal status: achieved    LONG TERM GOALS: Target date: 03/12/22  Will tolerate beginning light resistance exercises to shoulder Baseline: unable at eval Goal status: INITIAL Target date: 02/04/22 (74 weeks)  2.  Able to be out of her sling during the day, pain <=4/10 Baseline: unable at eval Goal status: INITIAL Target date: 02/04/22 (74 weeks)  3.  Able to dress and fix hair independently and without pain Baseline: unable at eval Goal status: INITIAL Target date: 03/04/22 (12 weeks)  4.  AROM within 5 deg of opp UE Baseline: unable at eval Goal status: INITIAL Target date: 03/04/22 (12 weeks)   PLAN: PT FREQUENCY: 1-2x/week  PT DURATION: 12 weeks  PLANNED INTERVENTIONS: Therapeutic exercises, Therapeutic activity, Neuromuscular re-education, Patient/Family education, Self Care, Joint mobilization, Aquatic Therapy, Dry Needling, Spinal mobilization, Cryotherapy, Moist heat, Taping, Manual therapy, and Re-evaluation  PLAN FOR NEXT SESSION: continue per protocol, aggressive joint ROM and mobilizations  Alan Zhou PT, DPT 02/04/22 3:47 PM  

## 2022-02-09 ENCOUNTER — Ambulatory Visit (HOSPITAL_BASED_OUTPATIENT_CLINIC_OR_DEPARTMENT_OTHER): Payer: Medicare HMO | Admitting: Physical Therapy

## 2022-02-09 ENCOUNTER — Encounter (HOSPITAL_BASED_OUTPATIENT_CLINIC_OR_DEPARTMENT_OTHER): Payer: Self-pay | Admitting: Physical Therapy

## 2022-02-09 DIAGNOSIS — M25612 Stiffness of left shoulder, not elsewhere classified: Secondary | ICD-10-CM

## 2022-02-09 DIAGNOSIS — M25512 Pain in left shoulder: Secondary | ICD-10-CM | POA: Diagnosis not present

## 2022-02-09 DIAGNOSIS — M6281 Muscle weakness (generalized): Secondary | ICD-10-CM

## 2022-02-09 NOTE — Therapy (Signed)
OUTPATIENT PHYSICAL THERAPY SHOULDER TREATMENT   Patient Name: Heidi Shah MRN: [id number] DOB:12-Jun-1945, 76 y.o., female Today's Date: 02/09/2022   PT End of Session - 02/09/22 0927     Visit Number 10    Number of Visits 25    Date for PT Re-Evaluation 03/05/22    Authorization Type [employer] M[employer]    PT Start Time 0930    PT Stop Time 1008    PT Time Calculation (min) 38 min    Activity Tolerance Patient tolerated treatment well    Behavior During Therapy WFL for tasks assessed/performed                   Past Medical History:  Diagnosis Date   Allergy    Anxiety    Arthritis    Cataract    surgery - Bilateral cataracts removed   Chicken pox    Colon polyps    Depression    Diverticulitis    Diverticulosis    Esophageal stricture    Family history of adverse reaction to anesthesia    difficult time waking mother up   GERD (gastroesophageal reflux disease)    Heart murmur    Hypertension    Past Surgical History:  Procedure Laterality Date   cataract surgery Bilateral 1996   COLONOSCOPY  2022   EYE SURGERY  2000   detached L retina   HERNIA REPAIR Bilateral 1990   inguinal   NECK SURGERY     as a child   POLYPECTOMY  2022   SHOULDER ARTHROSCOPY WITH ROTATOR CUFF REPAIR AND OPEN BICEPS TENODESIS Left 12/10/2021   Procedure: LEFT SHOULDER ARTHROSCOPY WITH ROTATOR CUFF REPAIR AND OPEN BICEPS TENODESIS;  Surgeon: Vanetta Mulders, MD;  Location: [facility];  Service: Orthopedics;  Laterality: Left;   UPPER GASTROINTESTINAL ENDOSCOPY  2022   Patient Active Problem List   Diagnosis Date Noted   Traumatic complete tear of left rotator cuff    Cervical spondylosis with myelopathy and radiculopathy 03/14/2020   Neck pain 03/14/2020   Aortic heart murmur 02/25/2020   Osteoporosis 04/04/2019   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   Unspecified constipation 03/05/2013   GERD (gastroesophageal reflux disease) 01/19/2013   Esophageal spasm 01/19/2013    Dysphagia, unspecified(787.20) 01/19/2013   Diverticulitis of colon (without mention of hemorrhage)(562.11) 11/23/2012   History of depression 11/23/2012   Essential hypertension, benign 11/23/2012    REFERRING PROVIDER: Vanetta Mulders, MD  REFERRING DIAG: Postop, left shoulder rotator cuff repair PROCEDURE: Arthroscopic limited debridement - 29822 Arthroscopic subacromial decompression - 29826 Arthroscopic rotator cuff repair - 95284 Arthroscopic biceps tenodesis - 13244  THERAPY DIAG:  Acute pain of left shoulder  Stiffness of left shoulder, not elsewhere classified  Muscle weakness (generalized)  Rationale for Evaluation and Treatment Rehabilitation  ONSET DATE: DOS 12/10/21  Days since surgery: 61   SUBJECTIVE:  SUBJECTIVE STATEMENT: I realized I have not really been using my arm so when I saw my babies this weekend and I used my arm, feeling good.   PERTINENT HISTORY: H/o neck pain, OP  PAIN:  Are you having pain? No  PRECAUTIONS: Shoulder  WEIGHT BEARING RESTRICTIONS Yes shoulder  FALLS:  Has patient fallen in last 6 months? 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  PLOF: Independent  PATIENT GOALS strengthen arm  OBJECTIVE:   PATIENT SURVEYS:  FOTO 33  AROM LEFT 11/7  L  11/9  Shoulder flexion 130 135  Shoulder extension    Shoulder abduction 90 100  Shoulder adduction    Shoulder internal rotation  L5 reach  Shoulder external rotation 56 C3 reach  (Blank rows = not tested)    TODAY'S TREATMENT:   Treatment                            02/09/22:  Pulleys- added OH hold Flexion to 90 with wand in mirror Rt sidelying: ER, ER+abd reach to ceiling,  circles in ABD Supine ABCs with 1lb  Flexion stretch slide on table 3 deep breaths Upper trap stretch   Treatment                            02/04/22:  PROM- flexion to 135 ABD to 105, IR to belly, ER to 40   Joint mob: L GHJ inf and posterior  glide grade III  STM L subscap, infra, teres minor/major  Exercises supine protraction 3x10 S/L ER 3x8 1lb S/L ABD 3x8 wall slide 2x10 ABD 2x5 5s (140 with wall assist) Horizontal ABD YTB 3x8 table foam roller flexion and ABD 1x10 5s hold    Treatment                            02/02/22:  MANUAL: STM Lt subscap with lateral scapular distraction paired with flexion stretching; flexion stretch with scapular block; end range abd Inf mobs Supine flexion with wand Sidelying abd & flexion with cues for scap control Standing scaption in mirror with scapular awareness    PATIENT EDUCATION: Education details: anatomy, exercise progression, DOMS expectations, HEP, POC  Person educated: Patient and daughter Education method: Explanation, Demonstration, Tactile cues, Verbal cues, and Handouts Education comprehension: verbalized understanding, returned demonstration, verbal cues required, tactile cues required, and needs further education   HOME EXERCISE PROGRAM: [id number]  ASSESSMENT:  CLINICAL IMPRESSION: Days since surgery: 4  Will be out of town for the holidays, updated HEP for focus and discussed importance of control in the 90-120 range before progressing weight.     OBJECTIVE IMPAIRMENTS decreased activity tolerance, decreased ROM, decreased strength, increased edema, increased muscle spasms, impaired flexibility, impaired UE functional use, improper body mechanics, postural dysfunction, and pain.   ACTIVITY LIMITATIONS carrying, lifting, sleeping, bed mobility, bathing, dressing, reach over head, hygiene/grooming, and caring for others  PARTICIPATION LIMITATIONS: meal prep, cleaning, laundry, driving, shopping, community  activity, and occupation  PERSONAL FACTORS 1-2 comorbidities: h/o neck pain, OP  are also affecting patient's functional outcome.   REHAB POTENTIAL: Good  CLINICAL DECISION MAKING: Stable/uncomplicated  EVALUATION COMPLEXITY: Low   GOALS: Goals reviewed with patient? Yes  SHORT TERM GOALS: Target date: 01/08/22  PROM goals met for  4 weeks (140 FF, 40 ER, 60 ABD) Baseline: see obj Goal status: ongoing  2.  Average pain <=4/10 Baseline: none at rest at eval Goal status: achieved    LONG TERM GOALS: Target date: 03/12/22  Will tolerate beginning light resistance exercises to shoulder Baseline: unable at eval Goal status: achieved Target date: 02/04/22 (8 weeks)  2.  Able to be out of her sling during the day, pain <=4/10 Baseline: unable at eval Goal status: achieved Target date: 02/04/22 (8 weeks)  3.  Able to dress and fix hair independently and without pain Baseline: unable at eval Goal status: INITIAL Target date: 03/04/22 (12 weeks)  4.  AROM within 5 deg of opp UE Baseline: unable at eval Goal status: INITIAL Target date: 03/04/22 (12 weeks)   PLAN: PT FREQUENCY: 1-2x/week  PT DURATION: 12 weeks  PLANNED INTERVENTIONS: Therapeutic exercises, Therapeutic activity, Neuromuscular re-education, Patient/Family education, Self Care, Joint mobilization, Aquatic Therapy, Dry Needling, Spinal mobilization, Cryotherapy, Moist heat, Taping, Manual therapy, and Re-evaluation  PLAN FOR NEXT SESSION: continue per protocol, aggressive joint ROM and mobilizations  Xzavior Reinig C. Storm Dulski PT, DPT 02/09/22 10:08 AM

## 2022-02-11 ENCOUNTER — Encounter (HOSPITAL_BASED_OUTPATIENT_CLINIC_OR_DEPARTMENT_OTHER): Payer: Self-pay | Admitting: Physical Therapy

## 2022-02-11 ENCOUNTER — Ambulatory Visit (HOSPITAL_BASED_OUTPATIENT_CLINIC_OR_DEPARTMENT_OTHER): Payer: Medicare HMO | Admitting: Physical Therapy

## 2022-02-11 DIAGNOSIS — M25612 Stiffness of left shoulder, not elsewhere classified: Secondary | ICD-10-CM | POA: Diagnosis not present

## 2022-02-11 DIAGNOSIS — M6281 Muscle weakness (generalized): Secondary | ICD-10-CM

## 2022-02-11 DIAGNOSIS — M25512 Pain in left shoulder: Secondary | ICD-10-CM | POA: Diagnosis not present

## 2022-02-11 NOTE — Therapy (Addendum)
OUTPATIENT PHYSICAL THERAPY SHOULDER TREATMENT  Progress Note Reporting Period 12/15/21 to 02/11/22   See note below for Objective Data and Assessment of Progress/Goals.       Patient Name: Heidi Shah MRN: [id number] DOB:[birth year], 76 y.o., female Today's Date: 02/11/2022   PT End of Session - 02/11/22 1659     Visit Number 11    Number of Visits 25    Date for PT Re-Evaluation 03/05/22    Authorization Type [employer] M[employer]    PT Start Time 1645    PT Stop Time 7371    PT Time Calculation (min) 30 min    Activity Tolerance Patient tolerated treatment well    Behavior During Therapy WFL for tasks assessed/performed                    Past Medical History:  Diagnosis Date   Allergy    Anxiety    Arthritis    Cataract    surgery - Bilateral cataracts removed   Chicken pox    Colon polyps    Depression    Diverticulitis    Diverticulosis    Esophageal stricture    Family history of adverse reaction to anesthesia    difficult time waking mother up   GERD (gastroesophageal reflux disease)    Heart murmur    Hypertension    Past Surgical History:  Procedure Laterality Date   cataract surgery Bilateral 1996   COLONOSCOPY  2022   EYE SURGERY  2000   detached L retina   HERNIA REPAIR Bilateral 1990   inguinal   NECK SURGERY     as a child   POLYPECTOMY  2022   SHOULDER ARTHROSCOPY WITH ROTATOR CUFF REPAIR AND OPEN BICEPS TENODESIS Left 12/10/2021   Procedure: LEFT SHOULDER ARTHROSCOPY WITH ROTATOR CUFF REPAIR AND OPEN BICEPS TENODESIS;  Surgeon: Vanetta Mulders, MD;  Location: [place];  Service: Orthopedics;  Laterality: Left;   UPPER GASTROINTESTINAL ENDOSCOPY  2022   Patient Active Problem List   Diagnosis Date Noted   Traumatic complete tear of left rotator cuff    Cervical spondylosis with myelopathy and radiculopathy 03/14/2020   Neck pain 03/14/2020   Aortic heart murmur 02/25/2020   Osteoporosis 04/04/2019   Borderline glaucoma  04/05/2018   Unspecified constipation 03/05/2013   GERD (gastroesophageal reflux disease) 01/19/2013   Esophageal spasm 01/19/2013   Dysphagia, unspecified(787.20) 01/19/2013   Diverticulitis of colon (without mention of hemorrhage)(562.11) 11/23/2012   History of depression 11/23/2012   Essential hypertension, benign 11/23/2012    REFERRING PROVIDER: Vanetta Mulders, MD  REFERRING DIAG: Postop, left shoulder rotator cuff repair PROCEDURE: Arthroscopic limited debridement - 29822 Arthroscopic subacromial decompression - 29826 Arthroscopic rotator cuff repair - 29827 Arthroscopic biceps tenodesis - 06269  THERAPY DIAG:  Acute pain of left shoulder  Muscle weakness (generalized)  Stiffness of left shoulder, not elsewhere classified  Rationale for Evaluation and Treatment Rehabilitation  ONSET DATE: DOS 12/10/21  Days since surgery: 63   SUBJECTIVE:  SUBJECTIVE STATEMENT: Pt states she has not had pain. It is just sore from her recent massage. Pt is now 9 wks  PERTINENT HISTORY: H/o neck pain, OP  PAIN:  Are you having pain? No  PRECAUTIONS: Shoulder  WEIGHT BEARING RESTRICTIONS Yes shoulder  FALLS:  Has patient fallen in last 6 months? 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  PLOF: Independent  PATIENT GOALS strengthen arm  OBJECTIVE:   PATIENT SURVEYS:  FOTO 41  FOTO 11/16  AROM LEFT 11/7  L  11/9  Shoulder flexion 130 135  Shoulder extension    Shoulder abduction 90 100  Shoulder adduction    Shoulder internal rotation  L5 reach  Shoulder external rotation 56 C3 reach  (Blank rows = not tested)   MMT R  L  11/16  Shoulder flexion 4+/5 4-/5  Shoulder  extension 4+/5 4/5  Shoulder abduction 4+/5 4-/5  Shoulder adduction 4+/5   Shoulder internal rotation 4+/5 4+/5  Shoulder external rotation 4+/5 4/5     TODAY'S TREATMENT:   Treatment                            02/04/22:    Joint mob: L GHJ inf and posterior  glide grade III    Exercises supine protraction 3x10 S/L ER 3x8 1lb S/L ABD 1lb 2x10 Supine ABC 1lb 2x Prone ext and Abd 210     Treatment                            02/09/22:  Pulleys- added OH hold Flexion to 90 with wand in mirror Rt sidelying: ER, ER+abd reach to ceiling, circles in ABD Supine ABCs with 1lb  Flexion stretch slide on table 3 deep breaths Upper trap stretch   Treatment                            02/04/22:  PROM- flexion to 135 ABD to 105, IR to belly, ER to 40   Joint mob: L GHJ inf and posterior  glide grade III  STM L subscap, infra, teres minor/major  Exercises supine protraction 3x10 S/L ER 3x8 1lb S/L ABD 3x8 wall slide 2x10 ABD 2x5 5s (140 with wall assist) Horizontal ABD YTB 3x8 table foam roller flexion and ABD 1x10 5s hold    Treatment                            02/02/22:  MANUAL: STM Lt subscap with lateral scapular distraction paired with flexion stretching; flexion stretch with scapular block; end range abd Inf mobs Supine flexion with wand Sidelying abd & flexion with cues for scap control Standing scaption in mirror with scapular awareness    PATIENT EDUCATION: Education details: anatomy, exercise progression, DOMS expectations, HEP, POC  Person educated: Patient and daughter Education method: Explanation, Demonstration, Tactile cues, Verbal cues, and Handouts Education comprehension: verbalized understanding, returned demonstration, verbal cues required, tactile cues required, and needs further education   HOME EXERCISE PROGRAM: [id number]  ASSESSMENT:  CLINICAL IMPRESSION: Days since surgery: 63  Pt with objective and subjective improvements as evidenced  by FOTO measure and previous measurements. Pt with significantly improved flexion and IR/ER but pt is still very stiffness into  ABD with firm end feel once at 120 with PROM. Pt's inf and superior joint stiffness is concerning and advised to continue to stretch regularly and to hold for longer duration as tolerated. Plan to continue with ROM and progressive strength as able. Pt would benefit from continued skilled therapy in order to reach goals and maximize functional L UE strength and ROM for full return to PLOF.     OBJECTIVE IMPAIRMENTS decreased activity tolerance, decreased ROM, decreased strength, increased edema, increased muscle spasms, impaired flexibility, impaired UE functional use, improper body mechanics, postural dysfunction, and pain.   ACTIVITY LIMITATIONS carrying, lifting, sleeping, bed mobility, bathing, dressing, reach over head, hygiene/grooming, and caring for others  PARTICIPATION LIMITATIONS: meal prep, cleaning, laundry, driving, shopping, community activity, and occupation  PERSONAL FACTORS 1-2 comorbidities: h/o neck pain, OP  are also affecting patient's functional outcome.   REHAB POTENTIAL: Good  CLINICAL DECISION MAKING: Stable/uncomplicated  EVALUATION COMPLEXITY: Low   GOALS: Goals reviewed with patient? Yes  SHORT TERM GOALS: Target date: 01/08/22  PROM goals met for 4 weeks (140 FF, 40 ER, 60 ABD) Baseline: see obj Goal status: ongoing  2.  Average pain <=4/10 Baseline: none at rest at eval Goal status: achieved    LONG TERM GOALS: Target date: 03/12/22  Will tolerate beginning light resistance exercises to shoulder Baseline: unable at eval Goal status: achieved Target date: 02/04/22 (8 weeks)  2.  Able to be out of her sling during the day, pain <=4/10 Baseline: unable at eval Goal status: achieved Target date: 02/04/22 (8 weeks)  3.  Able to dress and fix hair independently and without pain Baseline: unable at eval Goal status:  met Target date: 03/04/22 (12 weeks)  4.  AROM within 5 deg of opp UE Baseline: unable at eval Goal status: ongoing Target date: 03/04/22 (12 weeks)   PLAN: PT FREQUENCY: 1-2x/week  PT DURATION: 12 weeks  PLANNED INTERVENTIONS: Therapeutic exercises, Therapeutic activity, Neuromuscular re-education, Patient/Family education, Self Care, Joint mobilization, Aquatic Therapy, Dry Needling, Spinal mobilization, Cryotherapy, Moist heat, Taping, Manual therapy, and Re-evaluation  PLAN FOR NEXT SESSION: continue per protocol, aggressive joint ROM and mobilizations    Daleen Bo PT, DPT 02/11/22 5:18 PM

## 2022-02-15 ENCOUNTER — Encounter (HOSPITAL_BASED_OUTPATIENT_CLINIC_OR_DEPARTMENT_OTHER): Payer: Medicare HMO | Admitting: Physical Therapy

## 2022-02-16 ENCOUNTER — Encounter (HOSPITAL_BASED_OUTPATIENT_CLINIC_OR_DEPARTMENT_OTHER): Payer: Medicare HMO | Admitting: Physical Therapy

## 2022-02-23 ENCOUNTER — Encounter (HOSPITAL_BASED_OUTPATIENT_CLINIC_OR_DEPARTMENT_OTHER): Payer: Self-pay | Admitting: Physical Therapy

## 2022-02-23 ENCOUNTER — Ambulatory Visit (HOSPITAL_BASED_OUTPATIENT_CLINIC_OR_DEPARTMENT_OTHER): Payer: Medicare HMO | Admitting: Physical Therapy

## 2022-02-23 ENCOUNTER — Other Ambulatory Visit: Payer: Self-pay | Admitting: Internal Medicine

## 2022-02-23 DIAGNOSIS — M25512 Pain in left shoulder: Secondary | ICD-10-CM

## 2022-02-23 DIAGNOSIS — M25612 Stiffness of left shoulder, not elsewhere classified: Secondary | ICD-10-CM | POA: Diagnosis not present

## 2022-02-23 DIAGNOSIS — K259 Gastric ulcer, unspecified as acute or chronic, without hemorrhage or perforation: Secondary | ICD-10-CM

## 2022-02-23 DIAGNOSIS — M6281 Muscle weakness (generalized): Secondary | ICD-10-CM | POA: Diagnosis not present

## 2022-02-23 NOTE — Therapy (Signed)
OUTPATIENT PHYSICAL THERAPY SHOULDER TREATMENT  Progress Note Reporting Period 12/15/21 to 02/23/22   See note below for Objective Data and Assessment of Progress/Goals.       Patient Name: Heidi Shah MRN: [id number] DOB:06/22/1945, 76 y.o., female Today's Date: 02/23/2022   PT End of Session - 02/23/22 1137     Visit Number 12    Number of Visits 25    Date for PT Re-Evaluation 03/05/22    Authorization Type [employer] M[employer]    PT Start Time 1145    PT Stop Time 1218    PT Time Calculation (min) 33 min    Activity Tolerance Patient tolerated treatment well    Behavior During Therapy WFL for tasks assessed/performed                    Past Medical History:  Diagnosis Date   Allergy    Anxiety    Arthritis    Cataract    surgery - Bilateral cataracts removed   Chicken pox    Colon polyps    Depression    Diverticulitis    Diverticulosis    Esophageal stricture    Family history of adverse reaction to anesthesia    difficult time waking mother up   GERD (gastroesophageal reflux disease)    Heart murmur    Hypertension    Past Surgical History:  Procedure Laterality Date   cataract surgery Bilateral 1996   COLONOSCOPY  2022   EYE SURGERY  2000   detached L retina   HERNIA REPAIR Bilateral 1990   inguinal   NECK SURGERY     as a child   POLYPECTOMY  2022   SHOULDER ARTHROSCOPY WITH ROTATOR CUFF REPAIR AND OPEN BICEPS TENODESIS Left 12/10/2021   Procedure: LEFT SHOULDER ARTHROSCOPY WITH ROTATOR CUFF REPAIR AND OPEN BICEPS TENODESIS;  Surgeon: Vanetta Mulders, MD;  Location: [place];  Service: Orthopedics;  Laterality: Left;   UPPER GASTROINTESTINAL ENDOSCOPY  2022   Patient Active Problem List   Diagnosis Date Noted   Traumatic complete tear of left rotator cuff    Cervical spondylosis with myelopathy and radiculopathy 03/14/2020   Neck pain 03/14/2020   Aortic heart murmur 02/25/2020   Osteoporosis 04/04/2019   Borderline glaucoma  04/05/2018   Unspecified constipation 03/05/2013   GERD (gastroesophageal reflux disease) 01/19/2013   Esophageal spasm 01/19/2013   Dysphagia, unspecified(787.20) 01/19/2013   Diverticulitis of colon (without mention of hemorrhage)(562.11) 11/23/2012   History of depression 11/23/2012   Essential hypertension, benign 11/23/2012    REFERRING PROVIDER: Vanetta Mulders, MD  REFERRING DIAG: Postop, left shoulder rotator cuff repair PROCEDURE: Arthroscopic limited debridement - 29822 Arthroscopic subacromial decompression - 29826 Arthroscopic rotator cuff repair - 29827 Arthroscopic biceps tenodesis - 91791  THERAPY DIAG:  Acute pain of left shoulder  Muscle weakness (generalized)  Stiffness of left shoulder, not elsewhere classified  Rationale for Evaluation and Treatment Rehabilitation  ONSET DATE: DOS 12/10/21  Days since surgery: 75   SUBJECTIVE:  SUBJECTIVE STATEMENT:  Pt is reaching BHB and OH much better now. She is very pleased with the new ROM. Pt is now 10 wks  PERTINENT HISTORY: H/o neck pain, OP  PAIN:  Are you having pain? No  PRECAUTIONS: Shoulder  WEIGHT BEARING RESTRICTIONS Yes shoulder  FALLS:  Has patient fallen in last 6 months? 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  PLOF: Independent  PATIENT GOALS strengthen arm  OBJECTIVE:   PATIENT SURVEYS:  FOTO 47  FOTO 11/16  AROM LEFT 11/7  L  11/9 L 11/28  Shoulder flexion 130 135 140  Shoulder extension     Shoulder abduction 90 100 125  Shoulder adduction     Shoulder internal rotation  L5 reach L3 reach  Shoulder external rotation 56 C3 reach C5 reach  (Blank rows = not tested)   MMT R  L   11/16  Shoulder flexion 4+/5 4-/5  Shoulder extension 4+/5 4/5  Shoulder abduction 4+/5 4-/5  Shoulder adduction 4+/5   Shoulder internal rotation 4+/5 4+/5  Shoulder external rotation 4+/5 4/5     TODAY'S TREATMENT:   Treatment                            02/23/22:    Joint mob: L GHJ inf and posterior  glide grade III    Exercises -supine stargazer stretch 30s 3x Doorway pec 30s 3x GTB row 2x10 GTB ext 3x5 shoulder ER YTB 2x10 Standing scaption with golf club Pulley ABD discussed for home  Treatment                            02/04/22:    Joint mob: L GHJ inf and posterior  glide grade III    Exercises supine protraction 3x10 S/L ER 3x8 1lb S/L ABD 1lb 2x10 Supine ABC 1lb 2x Prone ext and Abd 210     Treatment                            02/09/22:  Pulleys- added OH hold Flexion to 90 with wand in mirror Rt sidelying: ER, ER+abd reach to ceiling, circles in ABD Supine ABCs with 1lb  Flexion stretch slide on table 3 deep breaths Upper trap stretch   Treatment                            02/04/22:  PROM- flexion to 135 ABD to 105, IR to belly, ER to 40   Joint mob: L GHJ inf and posterior  glide grade III  STM L subscap, infra, teres minor/major  Exercises supine protraction 3x10 S/L ER 3x8 1lb S/L ABD 3x8 wall slide 2x10 ABD 2x5 5s (140 with wall assist) Horizontal ABD YTB 3x8 table foam roller flexion and ABD 1x10 5s hold    Treatment                            02/02/22:  MANUAL: STM Lt subscap with lateral scapular distraction paired with flexion stretching; flexion stretch with scapular block; end range abd Inf mobs Supine flexion with wand Sidelying abd & flexion with cues for scap control Standing scaption in mirror with scapular awareness  PATIENT EDUCATION: Education details: anatomy, exercise progression, DOMS expectations, HEP, POC  Person educated: Patient and daughter Education method: Explanation, Demonstration, Tactile  cues, Verbal cues, and Handouts Education comprehension: verbalized understanding, returned demonstration, verbal cues required, tactile cues required, and needs further education   HOME EXERCISE PROGRAM: [id number]  ASSESSMENT:  CLINICAL IMPRESSION: Days since surgery: 17  Patient demonstrate improvement with overhead range of motion especially to flexion and abduction.  Patient with abduction range of motion up to 125 degrees but does tends to favor forward flexion as she reaches endrange.  Patient provided with open pec stretch in order to decrease anterior stiffness and strengthening progressed today without pain.  Patient home exercise program modified for decreased frequency of strengthening exercise while maintaining stretching and range of motion daily.  Patient advised to focus on abduction with pulleys in addition to her flexion going up overhead.  Plan to continue with range of motion and strengthening as tolerated. pt would benefit from continued skilled therapy in order to reach goals and maximize functional L UE strength and ROM for full return to PLOF.     OBJECTIVE IMPAIRMENTS decreased activity tolerance, decreased ROM, decreased strength, increased edema, increased muscle spasms, impaired flexibility, impaired UE functional use, improper body mechanics, postural dysfunction, and pain.   ACTIVITY LIMITATIONS carrying, lifting, sleeping, bed mobility, bathing, dressing, reach over head, hygiene/grooming, and caring for others  PARTICIPATION LIMITATIONS: meal prep, cleaning, laundry, driving, shopping, community activity, and occupation  PERSONAL FACTORS 1-2 comorbidities: h/o neck pain, OP  are also affecting patient's functional outcome.   REHAB POTENTIAL: Good  CLINICAL DECISION MAKING: Stable/uncomplicated  EVALUATION COMPLEXITY: Low   GOALS: Goals reviewed with patient? Yes  SHORT TERM GOALS: Target date: 01/08/22  PROM goals met for 4 weeks (140 FF, 40 ER, 60  ABD) Baseline: see obj Goal status: ongoing  2.  Average pain <=4/10 Baseline: none at rest at eval Goal status: achieved    LONG TERM GOALS: Target date: 03/12/22  Will tolerate beginning light resistance exercises to shoulder Baseline: unable at eval Goal status: achieved Target date: 02/04/22 (8 weeks)  2.  Able to be out of her sling during the day, pain <=4/10 Baseline: unable at eval Goal status: achieved Target date: 02/04/22 (8 weeks)  3.  Able to dress and fix hair independently and without pain Baseline: unable at eval Goal status: met Target date: 03/04/22 (12 weeks)  4.  AROM within 5 deg of opp UE Baseline: unable at eval Goal status: ongoing Target date: 03/04/22 (12 weeks)   PLAN: PT FREQUENCY: 1-2x/week  PT DURATION: 12 weeks  PLANNED INTERVENTIONS: Therapeutic exercises, Therapeutic activity, Neuromuscular re-education, Patient/Family education, Self Care, Joint mobilization, Aquatic Therapy, Dry Needling, Spinal mobilization, Cryotherapy, Moist heat, Taping, Manual therapy, and Re-evaluation  PLAN FOR NEXT SESSION: continue per protocol, joint ROM and mobilizations; progressive scapular, cuff, and biceps strength     Daleen Bo PT, DPT 02/23/22 12:37 PM

## 2022-02-25 ENCOUNTER — Encounter (HOSPITAL_BASED_OUTPATIENT_CLINIC_OR_DEPARTMENT_OTHER): Payer: Self-pay | Admitting: Physical Therapy

## 2022-02-25 ENCOUNTER — Ambulatory Visit (HOSPITAL_BASED_OUTPATIENT_CLINIC_OR_DEPARTMENT_OTHER): Payer: Medicare HMO | Admitting: Physical Therapy

## 2022-02-25 DIAGNOSIS — M25612 Stiffness of left shoulder, not elsewhere classified: Secondary | ICD-10-CM | POA: Diagnosis not present

## 2022-02-25 DIAGNOSIS — M6281 Muscle weakness (generalized): Secondary | ICD-10-CM | POA: Diagnosis not present

## 2022-02-25 DIAGNOSIS — M25512 Pain in left shoulder: Secondary | ICD-10-CM

## 2022-02-25 NOTE — Therapy (Signed)
OUTPATIENT PHYSICAL THERAPY SHOULDER TREATMENT     Patient Name: Heidi Shah MRN: [id number] DOB:01/25/1946, 76 y.o., female Today's Date: 02/25/2022   PT End of Session - 02/25/22 1430     Visit Number 13    Number of Visits 25    Date for PT Re-Evaluation 03/05/22    Authorization Type [employer] M[employer]    PT Start Time 1430    PT Stop Time 1510    PT Time Calculation (min) 40 min    Activity Tolerance Patient tolerated treatment well    Behavior During Therapy WFL for tasks assessed/performed                     Past Medical History:  Diagnosis Date   Allergy    Anxiety    Arthritis    Cataract    surgery - Bilateral cataracts removed   Chicken pox    Colon polyps    Depression    Diverticulitis    Diverticulosis    Esophageal stricture    Family history of adverse reaction to anesthesia    difficult time waking mother up   GERD (gastroesophageal reflux disease)    Heart murmur    Hypertension    Past Surgical History:  Procedure Laterality Date   cataract surgery Bilateral 1996   COLONOSCOPY  2022   EYE SURGERY  2000   detached L retina   HERNIA REPAIR Bilateral 1990   inguinal   NECK SURGERY     as a child   POLYPECTOMY  2022   SHOULDER ARTHROSCOPY WITH ROTATOR CUFF REPAIR AND OPEN BICEPS TENODESIS Left 12/10/2021   Procedure: LEFT SHOULDER ARTHROSCOPY WITH ROTATOR CUFF REPAIR AND OPEN BICEPS TENODESIS;  Surgeon: Vanetta Mulders, MD;  Location: [place];  Service: Orthopedics;  Laterality: Left;   UPPER GASTROINTESTINAL ENDOSCOPY  2022   Patient Active Problem List   Diagnosis Date Noted   Traumatic complete tear of left rotator cuff    Cervical spondylosis with myelopathy and radiculopathy 03/14/2020   Neck pain 03/14/2020   Aortic heart murmur 02/25/2020   Osteoporosis 04/04/2019   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   Unspecified constipation 03/05/2013   GERD (gastroesophageal reflux disease) 01/19/2013   Esophageal spasm 01/19/2013    Dysphagia, unspecified(787.20) 01/19/2013   Diverticulitis of colon (without mention of hemorrhage)(562.11) 11/23/2012   History of depression 11/23/2012   Essential hypertension, benign 11/23/2012    REFERRING PROVIDER: Vanetta Mulders, MD  REFERRING DIAG: Postop, left shoulder rotator cuff repair PROCEDURE: Arthroscopic limited debridement - 29822 Arthroscopic subacromial decompression - 29826 Arthroscopic rotator cuff repair - 29827 Arthroscopic biceps tenodesis - 16945  THERAPY DIAG:  Acute pain of left shoulder  Muscle weakness (generalized)  Stiffness of left shoulder, not elsewhere classified  Rationale for Evaluation and Treatment Rehabilitation  ONSET DATE: DOS 12/10/21  Days since surgery: 77   SUBJECTIVE:  SUBJECTIVE STATEMENT:  Beginning to feel a little more natural.   PERTINENT HISTORY: H/o neck pain, OP  PAIN:  Are you having pain? No  PRECAUTIONS: Shoulder  WEIGHT BEARING RESTRICTIONS Yes shoulder  FALLS:  Has patient fallen in last 6 months? 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  PLOF: Independent  PATIENT GOALS strengthen arm  OBJECTIVE:   PATIENT SURVEYS:  FOTO 58  FOTO 11/16  AROM LEFT 11/7  L  11/9 L 11/28  Shoulder flexion 130 135 140  Shoulder extension     Shoulder abduction 90 100 125  Shoulder adduction     Shoulder internal rotation  L5 reach L3 reach  Shoulder external rotation 56 C3 reach C5 reach  (Blank rows = not tested)   MMT R  L  11/16  Shoulder flexion 4+/5 4-/5  Shoulder extension 4+/5 4/5  Shoulder abduction 4+/5 4-/5  Shoulder adduction 4+/5   Shoulder internal rotation 4+/5 4+/5  Shoulder external rotation  4+/5 4/5     TODAY'S TREATMENT:   Treatment                            02/25/22:  Pulleys in abd- PT applied scapular stop for subscap stretch Scaption stretch in seated with physioball Elbow pn red physiobal +1lb, ER to abd press overhead Biceps curl to OH press 1lb Sidelying horiz abd, abduction to 90, abd to full range Low trap set liftoff of wall  MANUAL: STM to upper trap, pec minor/major, PA GHJ mobs at 90 abd, stretching: ER, IR, flexion   Treatment                            02/23/22:    Joint mob: L GHJ inf and posterior  glide grade III    Exercises -supine stargazer stretch 30s 3x Doorway pec 30s 3x GTB row 2x10 GTB ext 3x5 shoulder ER YTB 2x10 Standing scaption with golf club Pulley ABD discussed for home  Treatment                            02/04/22:    Joint mob: L GHJ inf and posterior  glide grade III    Exercises supine protraction 3x10 S/L ER 3x8 1lb S/L ABD 1lb 2x10 Supine ABC 1lb 2x Prone ext and Abd 210   PATIENT EDUCATION: Education details: anatomy, exercise progression, DOMS expectations, HEP, POC  Person educated: Patient and daughter Education method: Explanation, Demonstration, Tactile cues, Verbal cues, and Handouts Education comprehension: verbalized understanding, returned demonstration, verbal cues required, tactile cues required, and needs further education   HOME EXERCISE PROGRAM: [email or web address]  ASSESSMENT:  CLINICAL IMPRESSION: Days since surgery: 77  ROM is continuing to improve and pt does not experience impingement. Fatigue noted when raising 3lb today and notes she does use extension through lumbar spine as compensation.     OBJECTIVE IMPAIRMENTS decreased activity tolerance, decreased ROM, decreased strength, increased edema, increased muscle spasms, impaired flexibility, impaired UE functional use, improper body mechanics, postural dysfunction, and pain.   ACTIVITY LIMITATIONS carrying, lifting, sleeping, bed  mobility, bathing, dressing, reach over head, hygiene/grooming, and caring for others  PARTICIPATION LIMITATIONS: meal prep, cleaning, laundry, driving, shopping, community activity, and occupation  PERSONAL FACTORS 1-2 comorbidities: h/o neck pain, OP  are also affecting patient's functional outcome.   REHAB POTENTIAL: Good  CLINICAL DECISION MAKING: Stable/uncomplicated  EVALUATION COMPLEXITY: Low   GOALS: Goals reviewed with patient? Yes  SHORT TERM GOALS: Target date: 01/08/22  PROM goals met for 4 weeks (140 FF, 40 ER, 60 ABD) Baseline: see obj Goal status: ongoing  2.  Average pain <=4/10 Baseline: none at rest at eval Goal status: achieved    LONG TERM GOALS: Target date: 03/12/22  Will tolerate beginning light resistance exercises to shoulder Baseline: unable at eval Goal status: achieved Target date: 02/04/22 (8 weeks)  2.  Able to be out of her sling during the day, pain <=4/10 Baseline: unable at eval Goal status: achieved Target date: 02/04/22 (8 weeks)  3.  Able to dress and fix hair independently and without pain Baseline: unable at eval Goal status: met Target date: 03/04/22 (12 weeks)  4.  AROM within 5 deg of opp UE Baseline: unable at eval Goal status: ongoing Target date: 03/04/22 (12 weeks)   PLAN: PT FREQUENCY: 1-2x/week  PT DURATION: 12 weeks  PLANNED INTERVENTIONS: Therapeutic exercises, Therapeutic activity, Neuromuscular re-education, Patient/Family education, Self Care, Joint mobilization, Aquatic Therapy, Dry Needling, Spinal mobilization, Cryotherapy, Moist heat, Taping, Manual therapy, and Re-evaluation  PLAN FOR NEXT SESSION: continue per protocol, joint ROM and mobilizations; progressive scapular, cuff, and biceps strength. Light weight, full ROM challenges with scapular control.     Edouard Gikas C. Issabelle Mcraney PT, DPT 02/25/22 3:14 PM

## 2022-03-01 ENCOUNTER — Encounter (HOSPITAL_BASED_OUTPATIENT_CLINIC_OR_DEPARTMENT_OTHER): Payer: Self-pay | Admitting: Physical Therapy

## 2022-03-01 ENCOUNTER — Ambulatory Visit (INDEPENDENT_AMBULATORY_CARE_PROVIDER_SITE_OTHER): Payer: Medicare HMO | Admitting: Family Medicine

## 2022-03-01 ENCOUNTER — Ambulatory Visit (HOSPITAL_BASED_OUTPATIENT_CLINIC_OR_DEPARTMENT_OTHER): Payer: Medicare HMO | Attending: Family Medicine | Admitting: Physical Therapy

## 2022-03-01 ENCOUNTER — Ambulatory Visit (INDEPENDENT_AMBULATORY_CARE_PROVIDER_SITE_OTHER): Payer: Medicare HMO | Admitting: Orthopaedic Surgery

## 2022-03-01 ENCOUNTER — Encounter: Payer: Self-pay | Admitting: Family Medicine

## 2022-03-01 ENCOUNTER — Encounter (HOSPITAL_BASED_OUTPATIENT_CLINIC_OR_DEPARTMENT_OTHER): Payer: Self-pay | Admitting: Orthopaedic Surgery

## 2022-03-01 VITALS — BP 142/80 | HR 84 | Temp 98.2°F | Ht 59.06 in | Wt 117.9 lb

## 2022-03-01 DIAGNOSIS — J040 Acute laryngitis: Secondary | ICD-10-CM

## 2022-03-01 DIAGNOSIS — S46012A Strain of muscle(s) and tendon(s) of the rotator cuff of left shoulder, initial encounter: Secondary | ICD-10-CM

## 2022-03-01 DIAGNOSIS — Z23 Encounter for immunization: Secondary | ICD-10-CM | POA: Diagnosis not present

## 2022-03-01 DIAGNOSIS — Z0001 Encounter for general adult medical examination with abnormal findings: Secondary | ICD-10-CM | POA: Diagnosis not present

## 2022-03-01 DIAGNOSIS — I10 Essential (primary) hypertension: Secondary | ICD-10-CM

## 2022-03-01 DIAGNOSIS — R293 Abnormal posture: Secondary | ICD-10-CM | POA: Insufficient documentation

## 2022-03-01 DIAGNOSIS — M6281 Muscle weakness (generalized): Secondary | ICD-10-CM | POA: Insufficient documentation

## 2022-03-01 DIAGNOSIS — M25512 Pain in left shoulder: Secondary | ICD-10-CM | POA: Diagnosis not present

## 2022-03-01 DIAGNOSIS — G8929 Other chronic pain: Secondary | ICD-10-CM | POA: Insufficient documentation

## 2022-03-01 DIAGNOSIS — M25612 Stiffness of left shoulder, not elsewhere classified: Secondary | ICD-10-CM | POA: Diagnosis not present

## 2022-03-01 DIAGNOSIS — Z0000 Encounter for general adult medical examination without abnormal findings: Secondary | ICD-10-CM

## 2022-03-01 LAB — BASIC METABOLIC PANEL
BUN: 16 mg/dL (ref 6–23)
CO2: 31 mEq/L (ref 19–32)
Calcium: 9.4 mg/dL (ref 8.4–10.5)
Chloride: 99 mEq/L (ref 96–112)
Creatinine, Ser: 0.59 mg/dL (ref 0.40–1.20)
GFR: 89 mL/min (ref >60.00)
Glucose, Bld: 83 mg/dL (ref 70–99)
Potassium: 4 mEq/L (ref 3.5–5.1)
Sodium: 137 mEq/L (ref 135–145)

## 2022-03-01 LAB — HEPATIC FUNCTION PANEL
ALT: 12 U/L (ref 0–35)
AST: 20 U/L (ref 0–37)
Albumin: 4.4 g/dL (ref 3.5–5.2)
Alkaline Phosphatase: 58 U/L (ref 39–117)
Bilirubin, Direct: 0.1 mg/dL (ref 0.0–0.3)
Total Bilirubin: 0.4 mg/dL (ref 0.2–1.2)
Total Protein: 6.8 g/dL (ref 6.0–8.3)

## 2022-03-01 LAB — LIPID PANEL
Cholesterol: 252 mg/dL — ABNORMAL HIGH (ref 0–200)
HDL: 78.3 mg/dL (ref >39.00)
LDL Cholesterol: 160 mg/dL — ABNORMAL HIGH (ref 0–99)
NonHDL: 173.85
Total CHOL/HDL Ratio: 3
Triglycerides: 70 mg/dL (ref 0.0–149.0)
VLDL: 14 mg/dL (ref 0.0–40.0)

## 2022-03-01 LAB — CBC WITH DIFFERENTIAL/PLATELET
Basophils Absolute: 0.1 10*3/uL (ref 0.0–0.1)
Basophils Relative: 1.3 % (ref 0.0–3.0)
Eosinophils Absolute: 0.2 10*3/uL (ref 0.0–0.7)
Eosinophils Relative: 2.9 % (ref 0.0–5.0)
HCT: 40.2 % (ref 36.0–46.0)
Hemoglobin: 13.7 g/dL (ref 12.0–15.0)
Lymphocytes Relative: 34.3 % (ref 12.0–46.0)
Lymphs Abs: 1.8 10*3/uL (ref 0.7–4.0)
MCHC: 34.1 g/dL (ref 30.0–36.0)
MCV: 91.8 fl (ref 78.0–100.0)
Monocytes Absolute: 0.6 10*3/uL (ref 0.1–1.0)
Monocytes Relative: 12 % (ref 3.0–12.0)
Neutro Abs: 2.6 10*3/uL (ref 1.4–7.7)
Neutrophils Relative %: 49.5 % (ref 43.0–77.0)
Platelets: 344 10*3/uL (ref 150.0–400.0)
RBC: 4.38 Mil/uL (ref 3.87–5.11)
RDW: 14 % (ref 11.5–15.5)
WBC: 5.3 10*3/uL (ref 4.0–10.5)

## 2022-03-01 LAB — TSH: TSH: 0.98 u[IU]/mL (ref 0.35–5.50)

## 2022-03-01 MED ORDER — AMLODIPINE BESYLATE 2.5 MG PO TABS: 2.5000 mg | ORAL_TABLET | Freq: Every day | ORAL | 5 refills | Status: DC

## 2022-03-01 NOTE — Progress Notes (Signed)
Established Patient Office Visit  Subjective   Patient ID: Heidi Shah, female    DOB: 11-01-1945  Age: 76 y.o. MRN: [id number]  Chief Complaint  Patient presents with   Annual Exam    HPI   Heidi Shah is seen for physical exam.  She has been recovering from left shoulder surgery and is doing well better and hopes to be released from physical therapy later today.  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  She had respiratory illness with severe cough back in early October.  She had laryngitis symptoms now for at least 6 to 8 weeks.  Smoked only briefly in her teens and early 68s.  She does have longstanding history of GERD and takes Prilosec 40 mg daily.  Some postnasal drip symptoms.  She has history of osteoporosis.  She had been on Prolia but had some arthralgias in her knees which seem to get better after she stopped.  She is taking algae Cal supplement.  Has hypertension and takes lisinopril HCTZ.  Compliant with therapy.  Has had several recent blood pressure readings up above 300 systolic.  No headaches or dizziness.  Health maintenance reviewed  -Still needs flu vaccine.  Other immunizations up-to-date -Colonoscopy and mammogram up-to-date -DEXA scan was last year  Social history-divorced.  She has 2 children and 3 grandchildren and 1 on the way.  Non-smoker.  No regular alcohol use.  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  Mother had atrial fibrillation.  Patient had no siblings.  Past Medical History:  Diagnosis Date   Allergy    Anxiety    Arthritis    Cataract    surgery - Bilateral cataracts removed   Chicken pox    Colon polyps    Depression    Diverticulitis    Diverticulosis    Esophageal stricture    Family history of adverse reaction to anesthesia    difficult time waking mother up   GERD (gastroesophageal reflux disease)    Heart  murmur    Hypertension    Past Surgical History:  Procedure Laterality Date   cataract surgery Bilateral 1996   COLONOSCOPY  2022   EYE SURGERY  2000   detached L retina   HERNIA REPAIR Bilateral 1990   inguinal   NECK SURGERY     as a child   POLYPECTOMY  2022   SHOULDER ARTHROSCOPY WITH ROTATOR CUFF REPAIR AND OPEN BICEPS TENODESIS Left 12/10/2021   Procedure: LEFT SHOULDER ARTHROSCOPY WITH ROTATOR CUFF REPAIR AND OPEN BICEPS TENODESIS;  Surgeon: Vanetta Mulders, MD;  Location: [facility];  Service: Orthopedics;  Laterality: Left;   UPPER GASTROINTESTINAL ENDOSCOPY  2022    reports that she has never smoked. She has never used smokeless tobacco. She reports current alcohol use of about 1.0 standard drink of alcohol per week. She reports that she does not use drugs. family history includes COPD in her mother; Cancer in her father; Crohn's disease in her mother; Heart disease (age of onset: 96) in her mother; Heart disease (age of onset: 51) in her father. Allergies  Allergen Reactions   Beeswax     Per skin test from dermatologist   Cetrimonium Chloride [Cetrimide]     Per skin test from dermatologist   Methylisothiazolinone     Per skin test from dermatologist   Neomycin Sulfate [Neomycin]     Per skin test from dermatologist  Nickel Sulfate [Nickel]     Per skin test from dermatologist   Penicillins     Childhood Reaction    Propolis     Per skin test from dermatologist    Review of Systems  Constitutional:  Negative for malaise/fatigue.  Eyes:  Negative for blurred vision.  Respiratory:  Negative for shortness of breath.   Cardiovascular:  Negative for chest pain.  Neurological:  Negative for dizziness, weakness and headaches.      Objective:     BP (!) 142/80 (BP Location: Left Arm, Cuff Size: Normal)   Pulse 84   Temp 98.2 F (36.8 C) (Oral)   Ht 4' 11.06" (1.5 m)   Wt 117 lb 14.4 oz (53.5 kg)   SpO2 99%   BMI 23.77 kg/m    Physical Exam Vitals reviewed.   Constitutional:      Appearance: She is well-developed.  HENT:     Head: Normocephalic and atraumatic.  Eyes:     Pupils: Pupils are equal, round, and reactive to light.  Neck:     Thyroid: No thyromegaly.  Cardiovascular:     Rate and Rhythm: Normal rate and regular rhythm.     Heart sounds: Normal heart sounds. No murmur heard. Pulmonary:     Effort: No respiratory distress.     Breath sounds: Normal breath sounds. No wheezing or rales.  Abdominal:     General: Bowel sounds are normal. There is no distension.     Palpations: Abdomen is soft. There is no mass.     Tenderness: There is no abdominal tenderness. There is no guarding or rebound.  Musculoskeletal:        General: Normal range of motion.     Cervical back: Normal range of motion and neck supple.  Lymphadenopathy:     Cervical: No cervical adenopathy.  Skin:    Findings: No rash.  Neurological:     Mental Status: She is alert and oriented to person, place, and time.     Cranial Nerves: No cranial nerve deficit.  Psychiatric:        Behavior: Behavior normal.        Thought Content: Thought content normal.        Judgment: Judgment normal.      No results found for any visits on 03/01/22.    The 10-year ASCVD risk score (Arnett DK, et al., 2019) is: 23.4%    Assessment & Plan:   Problem List Items Addressed This Visit   None Visit Diagnoses     Laryngitis    -  Primary   Relevant Orders   Ambulatory referral to ENT   Physical exam       Relevant Orders   Basic metabolic panel   Lipid panel   CBC with Differential/Platelet   TSH   Hepatic function panel     Hypertension poorly controlled.  Repeat blood pressure after rest left arm seated 142/80.  Add amlodipine 2.5 mg daily to her lisinopril HCTZ.  Set up 24-monthfollow-up to reassess.  She will monitor at home in the meantime  She had laryngitis symptoms now for over 6 weeks.  Does have history of GERD but is on Prilosec 40 mg daily.  Set up  ENT referral to further evaluate.  Rest voice is much as possible  -Recommend repeat DEXA scan next year -Continue annual mammogram -Flu vaccine given  Return in about 2 months (around 05/02/2022).    BCarolann Littler MD

## 2022-03-01 NOTE — Progress Notes (Signed)
Post Operative Evaluation    Procedure/Date of Surgery: 12/10/21 left shoulder rotator cuff repair and biceps tenodesis  Interval History:   Presents today 3 months status post left shoulder rotator cuff repair and biceps tenodesis.  Overall her motion and strength are improving.  That being said she does still have some stiffness on the left side compared to the contralateral side.  She is here today for further assessment.  Overall she is pleased with the progress of her recovery.   PMH/PSH/Family History/Social History/Meds/Allergies:    Past Medical History:  Diagnosis Date   Allergy    Anxiety    Arthritis    Cataract    surgery - Bilateral cataracts removed   Chicken pox    Colon polyps    Depression    Diverticulitis    Diverticulosis    Esophageal stricture    Family history of adverse reaction to anesthesia    difficult time waking mother up   GERD (gastroesophageal reflux disease)    Heart murmur    Hypertension    Past Surgical History:  Procedure Laterality Date   cataract surgery Bilateral 1996   COLONOSCOPY  2022   EYE SURGERY  2000   detached L retina   HERNIA REPAIR Bilateral 1990   inguinal   NECK SURGERY     as a child   POLYPECTOMY  2022   SHOULDER ARTHROSCOPY WITH ROTATOR CUFF REPAIR AND OPEN BICEPS TENODESIS Left 12/10/2021   Procedure: LEFT SHOULDER ARTHROSCOPY WITH ROTATOR CUFF REPAIR AND OPEN BICEPS TENODESIS;  Surgeon: Vanetta Mulders, MD;  Location: [facility];  Service: Orthopedics;  Laterality: Left;   UPPER GASTROINTESTINAL ENDOSCOPY  2022   Social History   Socioeconomic History   Marital status: Divorced    Spouse name: Not on file   Number of children: 2   Years of education: master's degree   Highest education level: Master's degree (e.g., MA, MS, MEng, MEd, MSW, MBA)  Occupational History   Occupation: retired Pharmacist, hospital    Comment: part - time Pharmacist, hospital  Tobacco Use   Smoking status: Never    Smokeless tobacco: Never  Vaping Use   Vaping Use: Never used  Substance and Sexual Activity   Alcohol use: Yes    Alcohol/week: 1.0 standard drink of alcohol    Types: 1 Glasses of wine per week    Comment: one or two glasses of wine a couple times a week   Drug use: No   Sexual activity: Not on file  Other Topics Concern   Not on file  Social History Narrative      Lives alone in ranch home.    2 children   Son lives in [place]   Continues to teach virtually part time   Social Determinants of Health   Financial Resource Strain: Low Risk  (05/19/2021)   Overall Financial Resource Strain (CARDIA)    Difficulty of Paying Living Expenses: Not hard at all  Food Insecurity: No Food Insecurity (05/19/2021)   Hunger Vital Sign    Worried About Running Out of Food in the Last Year: Never true    Ran Out of Food in the Last Year: Never true  Transportation Needs: No Transportation Needs (05/19/2021)   PRAPARE - Hydrologist (Medical): No  Lack of Transportation (Non-Medical): No  Physical Activity: Sufficiently Active (05/19/2021)   Exercise Vital Sign    Days of Exercise per Week: 3 days    Minutes of Exercise per Session: 60 min  Stress: No Stress Concern Present (05/19/2021)   [place]    Feeling of Stress : Only a little  Social Connections: Moderately Isolated (05/19/2021)   Social Connection and Isolation Panel [NHANES]    Frequency of Communication with Friends and Family: Once a week    Frequency of Social Gatherings with Friends and Family: Once a week    Attends Religious Services: More than 4 times per year    Active Member of [employer] or Organizations: Yes    Attends Music therapist: More than 4 times per year    Marital Status: Divorced   Family History  Problem Relation Age of Onset   Heart disease Mother 45   COPD Mother    Crohn's disease Mother     Heart disease Father 25   Cancer Father        kidney   Colon cancer Neg Hx    Esophageal cancer Neg Hx    Stomach cancer Neg Hx    Rectal cancer Neg Hx    Allergies  Allergen Reactions   Beeswax     Per skin test from dermatologist   Cetrimonium Chloride [Cetrimide]     Per skin test from dermatologist   Methylisothiazolinone     Per skin test from dermatologist   Neomycin Sulfate [Neomycin]     Per skin test from dermatologist   Nickel Sulfate [Nickel]     Per skin test from dermatologist   Penicillins     Childhood Reaction    Propolis     Per skin test from dermatologist   Current Outpatient Medications  Medication Sig Dispense Refill   amLODipine (NORVASC) 2.5 MG tablet Take 1 tablet (2.5 mg total) by mouth daily. 30 tablet 5   Biotin w/ Vitamins C & E (HAIR SKIN & NAILS GUMMIES PO) Take 2 each by mouth daily.     Calcium Carb-Cholecalciferol (CALCIUM 600+D3 PO) Take 1 tablet by mouth daily.     Cholecalciferol 25 MCG (1000 UT) CHEW Chew 1,000 Units by mouth daily.     cyanocobalamin (VITAMIN B12) 1000 MCG tablet Take 1,000 mcg by mouth daily.     Ginkgo Biloba 60 MG CAPS Take 60 mg by mouth daily.     lisinopril-hydrochlorothiazide (ZESTORETIC) 20-12.5 MG tablet TAKE ONE TABLET BY MOUTH ONE TIME DAILY 90 tablet 1   loratadine (CLARITIN) 10 MG tablet Take 10 mg by mouth daily.     Misc Natural Products (ELDERBERRY IMMUNE COMPLEX PO) Take 1 each by mouth daily.     Multiple Vitamin (MULTI-VITAMIN) tablet Take 1 tablet by mouth daily.     omeprazole (PRILOSEC) 40 MG capsule TAKE ONE CAPSULE BY MOUTH ONE TIME DAILY 30 capsule 11   Probiotic Product (PROBIOTIC DAILY PO) Take 1 capsule by mouth daily.     triamcinolone cream (KENALOG) 0.1 % Apply 1 Application topically 2 (two) times daily as needed. 30 g 0   vitamin C (ASCORBIC ACID) 500 MG tablet Take 500 mg by mouth daily.     VYZULTA 0.024 % SOLN Place 1 drop into both eyes at bedtime.     No current  facility-administered medications for this visit.   No results found.  Review of Systems:   A  ROS was performed including pertinent positives and negatives as documented in the HPI.   Musculoskeletal Exam:    There were no vitals taken for this visit.  Left shoulder incisions are healed.  In the sitting position she is able to forward elevate to 150 degrees on the left compared to 165 on the right.  External rotation at the side is to 60 degrees bilaterally.  Internal rotation is to T12 bilaterally.  Strength is nearly equal to the contralateral side and forward elevation.  2+ radial pulse  Imaging:      I personally reviewed and interpreted the radiographs.   Assessment:   12 weeks status post left shoulder rotator cuff repair and biceps tenodesis overall doing very well.  She does have some capsular stiffness at today's visit.  I would like her to work on this residual passive range of motion compared to the contralateral side in conjunction with the strengthening portion of the protocol.  I did specifically show her some capsular stretching exercises.  This time I have also offered her an ultrasound-guided glenohumeral injection to help with her capsular stiffness and to improve her range of motion.  She would like to proceed with this. Plan :    -Left shoulder glenohumeral ultrasound-guided injection performed after verbal consent obtained -Return to clinic in 3 months for reassess        I personally saw and evaluated the patient, and participated in the management and treatment plan.  Vanetta Mulders, MD Attending Physician, Orthopedic Surgery  This document was dictated using Dragon voice recognition software. A reasonable attempt at proof reading has been made to minimize errors.

## 2022-03-01 NOTE — Therapy (Signed)
OUTPATIENT PHYSICAL THERAPY SHOULDER TREATMENT     Patient Name: Heidi Shah MRN: [id number] DOB:1945-07-13, 76 y.o., female Today's Date: 03/01/2022   PT End of Session - 03/01/22 1033     Visit Number 14    Number of Visits 25    Date for PT Re-Evaluation 03/05/22    Authorization Type [employer] MCR    PT Start Time 1019    PT Stop Time 1047   modified session due to steriod shot right before session   PT Time Calculation (min) 28 min    Activity Tolerance Patient tolerated treatment well    Behavior During Therapy WFL for tasks assessed/performed                      Past Medical History:  Diagnosis Date   Allergy    Anxiety    Arthritis    Cataract    surgery - Bilateral cataracts removed   Chicken pox    Colon polyps    Depression    Diverticulitis    Diverticulosis    Esophageal stricture    Family history of adverse reaction to anesthesia    difficult time waking mother up   GERD (gastroesophageal reflux disease)    Heart murmur    Hypertension    Past Surgical History:  Procedure Laterality Date   cataract surgery Bilateral 1996   COLONOSCOPY  2022   EYE SURGERY  2000   detached L retina   HERNIA REPAIR Bilateral 1990   inguinal   NECK SURGERY     as a child   POLYPECTOMY  2022   SHOULDER ARTHROSCOPY WITH ROTATOR CUFF REPAIR AND OPEN BICEPS TENODESIS Left 12/10/2021   Procedure: LEFT SHOULDER ARTHROSCOPY WITH ROTATOR CUFF REPAIR AND OPEN BICEPS TENODESIS;  Surgeon: Vanetta Mulders, MD;  Location: [facility];  Service: Orthopedics;  Laterality: Left;   UPPER GASTROINTESTINAL ENDOSCOPY  2022   Patient Active Problem List   Diagnosis Date Noted   Traumatic complete tear of left rotator cuff    Cervical spondylosis with myelopathy and radiculopathy 03/14/2020   Neck pain 03/14/2020   Aortic heart murmur 02/25/2020   Osteoporosis 04/04/2019   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   Unspecified constipation 03/05/2013   GERD (gastroesophageal  reflux disease) 01/19/2013   Esophageal spasm 01/19/2013   Dysphagia, unspecified(787.20) 01/19/2013   Diverticulitis of colon (without mention of hemorrhage)(562.11) 11/23/2012   History of depression 11/23/2012   Essential hypertension, benign 11/23/2012    REFERRING PROVIDER: Vanetta Mulders, MD  REFERRING DIAG: Postop, left shoulder rotator cuff repair PROCEDURE: Arthroscopic limited debridement - 29822 Arthroscopic subacromial decompression - 29826 Arthroscopic rotator cuff repair - 29827 Arthroscopic biceps tenodesis - 30865  THERAPY DIAG:  Acute pain of left shoulder  Muscle weakness (generalized)  Stiffness of left shoulder, not elsewhere classified  Chronic left shoulder pain  Abnormal posture  Rationale for Evaluation and Treatment Rehabilitation  ONSET DATE: DOS 12/10/21  Days since surgery: 81   SUBJECTIVE:  SUBJECTIVE STATEMENT:  I saw Dr. Sammuel Hines this morning he gave me a steriod shot, he wants [place] working on ROM still.   PERTINENT HISTORY: H/o neck pain, OP  PAIN:  Are you having pain? No  PRECAUTIONS: Shoulder  WEIGHT BEARING RESTRICTIONS Yes shoulder  FALLS:  Has patient fallen in last 6 months? 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  PLOF: Independent  PATIENT GOALS strengthen arm  OBJECTIVE:   PATIENT SURVEYS:  FOTO 67  FOTO 11/16  AROM LEFT 11/7  L  11/9 L 11/28  Shoulder flexion 130 135 140  Shoulder extension     Shoulder abduction 90 100 125  Shoulder adduction     Shoulder internal rotation  L5 reach L3 reach  Shoulder external rotation 56 C3 reach C5 reach  (Blank rows = not tested)   MMT R  L  11/16  Shoulder flexion 4+/5  4-/5  Shoulder extension 4+/5 4/5  Shoulder abduction 4+/5 4-/5  Shoulder adduction 4+/5   Shoulder internal rotation 4+/5 4+/5  Shoulder external rotation 4+/5 4/5     TODAY'S TREATMENT:   03/01/22  TherEx  Supine AAROM into flexion x15 with 3 second holds Supine AAROM into abduction x15 with 3 second holds Seated shoulder ER AROM x15 towel under elbow  Seated L shoulder AROM x10 flexion and abduction Pillowcase AROM slides for abduction and flexion 10x10 seconds each   Manual  Grade II-III mobs to L GH joint for flexion/abduction      Treatment                            02/25/22:  Pulleys in abd- PT applied scapular stop for subscap stretch Scaption stretch in seated with physioball Elbow pn red physiobal +1lb, ER to abd press overhead Biceps curl to OH press 1lb Sidelying horiz abd, abduction to 90, abd to full range Low trap set liftoff of wall  MANUAL: STM to upper trap, pec minor/major, PA GHJ mobs at 90 abd, stretching: ER, IR, flexion   Treatment                            02/23/22:    Joint mob: L GHJ inf and posterior  glide grade III    Exercises -supine stargazer stretch 30s 3x Doorway pec 30s 3x GTB row 2x10 GTB ext 3x5 shoulder ER YTB 2x10 Standing scaption with golf club Pulley ABD discussed for home  Treatment                            02/04/22:    Joint mob: L GHJ inf and posterior  glide grade III    Exercises supine protraction 3x10 S/L ER 3x8 1lb S/L ABD 1lb 2x10 Supine ABC 1lb 2x Prone ext and Abd 210   PATIENT EDUCATION: Education details: modifications due to steroid injection this morning, careful not to force shoulder too much as pain is less after steroid shot but don't want to aggravate   Person educated: Patient and daughter Education method: Explanation, Demonstration, Tactile cues, Verbal cues, and Handouts Education comprehension: verbalized understanding, returned demonstration, verbal cues required, tactile  cues required, and needs further education   HOME EXERCISE PROGRAM: [id number]  ASSESSMENT:  CLINICAL IMPRESSION: Days since surgery: 68  [name] arrives  this morning after seeing the surgeon, who gave a steroid injection. Per clinic policy still saw patient for therapy but modified session to light tasks such as ROM/manual therapy as appropriate for 11.5 weeks post-op. Will return to prior intensity next session. Per surgeon's note looks like he would like [place] to increase focus on ROM. Needs re-assessment/recert next visit.     OBJECTIVE IMPAIRMENTS decreased activity tolerance, decreased ROM, decreased strength, increased edema, increased muscle spasms, impaired flexibility, impaired UE functional use, improper body mechanics, postural dysfunction, and pain.   ACTIVITY LIMITATIONS carrying, lifting, sleeping, bed mobility, bathing, dressing, reach over head, hygiene/grooming, and caring for others  PARTICIPATION LIMITATIONS: meal prep, cleaning, laundry, driving, shopping, community activity, and occupation  PERSONAL FACTORS 1-2 comorbidities: h/o neck pain, OP  are also affecting patient's functional outcome.   REHAB POTENTIAL: Good  CLINICAL DECISION MAKING: Stable/uncomplicated  EVALUATION COMPLEXITY: Low   GOALS: Goals reviewed with patient? Yes  SHORT TERM GOALS: Target date: 01/08/22  PROM goals met for 4 weeks (140 FF, 40 ER, 60 ABD) Baseline: see obj Goal status: ongoing  2.  Average pain <=4/10 Baseline: none at rest at eval Goal status: achieved    LONG TERM GOALS: Target date: 03/12/22  Will tolerate beginning light resistance exercises to shoulder Baseline: unable at eval Goal status: achieved Target date: 02/04/22 (8 weeks)  2.  Able to be out of her sling during the day, pain <=4/10 Baseline: unable at eval Goal status: achieved Target date: 02/04/22 (8 weeks)  3.  Able to dress and fix hair independently and without pain Baseline: unable at  eval Goal status: met Target date: 03/04/22 (12 weeks)  4.  AROM within 5 deg of opp UE Baseline: unable at eval Goal status: ongoing Target date: 03/04/22 (12 weeks)   PLAN: PT FREQUENCY: 1-2x/week  PT DURATION: 12 weeks  PLANNED INTERVENTIONS: Therapeutic exercises, Therapeutic activity, Neuromuscular re-education, Patient/Family education, Self Care, Joint mobilization, Aquatic Therapy, Dry Needling, Spinal mobilization, Cryotherapy, Moist heat, Taping, Manual therapy, and Re-evaluation  PLAN FOR NEXT SESSION: continue per protocol, joint ROM and mobilizations; progressive scapular, cuff, and biceps strength. Light weight, full ROM challenges with scapular control.     Ann Lions PT DPT PN2  03/01/2022, 10:49 AM

## 2022-03-01 NOTE — Patient Instructions (Signed)
Start the Amlodipine 2.5 mg once daily  Set up 2 month follow up  I am setting up ENT referral.

## 2022-03-10 ENCOUNTER — Telehealth: Payer: Self-pay | Admitting: Family Medicine

## 2022-03-10 ENCOUNTER — Ambulatory Visit (HOSPITAL_BASED_OUTPATIENT_CLINIC_OR_DEPARTMENT_OTHER): Payer: Medicare HMO | Admitting: Physical Therapy

## 2022-03-10 ENCOUNTER — Encounter (HOSPITAL_BASED_OUTPATIENT_CLINIC_OR_DEPARTMENT_OTHER): Payer: Self-pay | Admitting: Physical Therapy

## 2022-03-10 DIAGNOSIS — M25512 Pain in left shoulder: Secondary | ICD-10-CM

## 2022-03-10 DIAGNOSIS — M25612 Stiffness of left shoulder, not elsewhere classified: Secondary | ICD-10-CM

## 2022-03-10 DIAGNOSIS — M6281 Muscle weakness (generalized): Secondary | ICD-10-CM | POA: Diagnosis not present

## 2022-03-10 DIAGNOSIS — G8929 Other chronic pain: Secondary | ICD-10-CM | POA: Diagnosis not present

## 2022-03-10 DIAGNOSIS — R293 Abnormal posture: Secondary | ICD-10-CM | POA: Diagnosis not present

## 2022-03-10 NOTE — Therapy (Signed)
OUTPATIENT PHYSICAL THERAPY SHOULDER RE-EVAL/RECERT     Patient Name: Heidi Shah MRN: [id number] DOB:1945-06-06, 76 y.o., female Today's Date: 03/10/2022   PT End of Session - 03/10/22 0945     Visit Number 15    Number of Visits 25    Date for PT Re-Evaluation 03/05/22    Authorization Type [employer] MCR    PT Start Time [phone]   arrived a few minutes late   PT Stop Time 1011    PT Time Calculation (min) 35 min    Activity Tolerance Patient tolerated treatment well    Behavior During Therapy WFL for tasks assessed/performed                       Past Medical History:  Diagnosis Date   Allergy    Anxiety    Arthritis    Cataract    surgery - Bilateral cataracts removed   Chicken pox    Colon polyps    Depression    Diverticulitis    Diverticulosis    Esophageal stricture    Family history of adverse reaction to anesthesia    difficult time waking mother up   GERD (gastroesophageal reflux disease)    Heart murmur    Hypertension    Past Surgical History:  Procedure Laterality Date   cataract surgery Bilateral 1996   COLONOSCOPY  2022   EYE SURGERY  2000   detached L retina   HERNIA REPAIR Bilateral 1990   inguinal   NECK SURGERY     as a child   POLYPECTOMY  2022   SHOULDER ARTHROSCOPY WITH ROTATOR CUFF REPAIR AND OPEN BICEPS TENODESIS Left 12/10/2021   Procedure: LEFT SHOULDER ARTHROSCOPY WITH ROTATOR CUFF REPAIR AND OPEN BICEPS TENODESIS;  Surgeon: Vanetta Mulders, MD;  Location: [place];  Service: Orthopedics;  Laterality: Left;   UPPER GASTROINTESTINAL ENDOSCOPY  2022   Patient Active Problem List   Diagnosis Date Noted   Traumatic complete tear of left rotator cuff    Cervical spondylosis with myelopathy and radiculopathy 03/14/2020   Neck pain 03/14/2020   Aortic heart murmur 02/25/2020   Osteoporosis 04/04/2019   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   Unspecified constipation 03/05/2013   GERD (gastroesophageal reflux disease)  01/19/2013   Esophageal spasm 01/19/2013   Dysphagia, unspecified(787.20) 01/19/2013   Diverticulitis of colon (without mention of hemorrhage)(562.11) 11/23/2012   History of depression 11/23/2012   Essential hypertension, benign 11/23/2012    REFERRING PROVIDER: Vanetta Mulders, MD  REFERRING DIAG: Postop, left shoulder rotator cuff repair PROCEDURE: Arthroscopic limited debridement - 29822 Arthroscopic subacromial decompression - 29826 Arthroscopic rotator cuff repair - 29827 Arthroscopic biceps tenodesis - 87579  THERAPY DIAG:  Acute pain of left shoulder  Muscle weakness (generalized)  Stiffness of left shoulder, not elsewhere classified  Rationale for Evaluation and Treatment Rehabilitation  ONSET DATE: DOS 12/10/21  Days since surgery: 90   SUBJECTIVE:  SUBJECTIVE STATEMENT:  I've had a little bit of pain in that shoulder but I think its my fault because I tried to overdo some things. That pain is gone now. Nothing else new going on.   PERTINENT HISTORY: H/o neck pain, OP  PAIN:  Are you having pain? No  PRECAUTIONS: Shoulder lifting up to 20# restriction  WEIGHT BEARING RESTRICTIONS Yes shoulder  FALLS:  Has patient fallen in last 6 months? 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  PLOF: Independent  PATIENT GOALS strengthen arm  OBJECTIVE:   PATIENT SURVEYS:  FOTO 49  FOTO 11/16  AROM LEFT 11/7  L  11/9 L 11/28 L 12/13  Shoulder flexion 130 135 140 150* supine   Shoulder extension      Shoulder abduction 90 100 125 150* supine   Shoulder adduction      Shoulder internal rotation  L5 reach L3 reach T9 reach   Shoulder external rotation 56 C3 reach C5  reach C7 reach   (Blank rows = not tested)   MMT R  L  11/16 L 12/13  Shoulder flexion 4+/5 4-/5 4+/5  Shoulder extension 4+/5 4/5 4+/5  Shoulder abduction 4+/5 4-/5 3/5 discomfort   Shoulder adduction 4+/5    Shoulder internal rotation 4+/5 4+/5 4+/5  Shoulder external rotation 4+/5 4/5 4+/5     TODAY'S TREATMENT:    29/51/88  Re-assess/recert with objective measures + appropriate education  FOTO 66   TherEx  UBE L1 x3 minutes forward/x3 backward Supine shoulder flexion red TB x12  Supine shoulder ABD yellow TB to 90* x12    03/01/22  TherEx  Supine AAROM into flexion x15 with 3 second holds Supine AAROM into abduction x15 with 3 second holds Seated shoulder ER AROM x15 towel under elbow  Seated L shoulder AROM x10 flexion and abduction Pillowcase AROM slides for abduction and flexion 10x10 seconds each   Manual  Grade II-III mobs to L GH joint for flexion/abduction      Treatment                            02/25/22:  Pulleys in abd- PT applied scapular stop for subscap stretch Scaption stretch in seated with physioball Elbow pn red physiobal +1lb, ER to abd press overhead Biceps curl to OH press 1lb Sidelying horiz abd, abduction to 90, abd to full range Low trap set liftoff of wall  MANUAL: STM to upper trap, pec minor/major, PA GHJ mobs at 90 abd, stretching: ER, IR, flexion   Treatment                            02/23/22:    Joint mob: L GHJ inf and posterior  glide grade III    Exercises -supine stargazer stretch 30s 3x Doorway pec 30s 3x GTB row 2x10 GTB ext 3x5 shoulder ER YTB 2x10 Standing scaption with golf club Pulley ABD discussed for home  Treatment                            02/04/22:    Joint mob: L GHJ inf and posterior  glide grade III    Exercises supine protraction 3x10 S/L ER 3x8 1lb S/L ABD 1lb 2x10 Supine ABC 1lb 2x Prone ext and  Abd 210   PATIENT EDUCATION: Education details: goal review/progress/POC   Person educated: Patient Education method: Explanation, Demonstration, Tactile cues, Verbal cues, and Handouts Education comprehension: verbalized understanding, returned demonstration, verbal cues required, tactile cues required, and needs further education   HOME EXERCISE PROGRAM: [id number]  ASSESSMENT:  CLINICAL IMPRESSION: Days since surgery: 24  Heidi Shah arrives doing well today. Got objective measures and did goal review, also sent recert with updated POC. Definitely still in need of skilled PT services moving forward for recovery after surgery.       OBJECTIVE IMPAIRMENTS decreased activity tolerance, decreased ROM, decreased strength, increased edema, increased muscle spasms, impaired flexibility, impaired UE functional use, improper body mechanics, postural dysfunction, and pain.   ACTIVITY LIMITATIONS carrying, lifting, sleeping, bed mobility, bathing, dressing, reach over head, hygiene/grooming, and caring for others  PARTICIPATION LIMITATIONS: meal prep, cleaning, laundry, driving, shopping, community activity, and occupation  PERSONAL FACTORS 1-2 comorbidities: h/o neck pain, OP  are also affecting patient's functional outcome.   REHAB POTENTIAL: Good  CLINICAL DECISION MAKING: Stable/uncomplicated  EVALUATION COMPLEXITY: Low   GOALS: Goals reviewed with patient? Yes  SHORT TERM GOALS: Target date: 01/08/22  PROM goals met for 4 weeks (140 FF, 40 ER, 60 ABD) Baseline: see obj Goal status: 12/13- MET   2.  Average pain <=4/10 Baseline: none at rest at eval Goal status: achieved    LONG TERM GOALS: Target date: 03/12/22  Will tolerate beginning light resistance exercises to shoulder Baseline: unable at eval Goal status: achieved Target date: 02/04/22 (8 weeks)  2.  Able to be out of her sling during the day, pain <=4/10 Baseline: unable at eval Goal status: achieved Target date: 02/04/22 (8 weeks)  3.  Able to dress and fix hair independently and  without pain Baseline: unable at eval Goal status: met Target date: 03/04/22 (12 weeks)  4.  AROM within 5 deg of opp UE Baseline: unable at eval Goal status: PARTIALLY MET  Target date: 12/13- met in flexion, still tight in abd but all AROM functional  5.   MMT to be 5/5 in all tested groups in surgical shoulder  Baseline:  Goal status: INITIAL      PLAN: PT FREQUENCY: 1x/week  PT DURATION: 6 weeks  PLANNED INTERVENTIONS: Therapeutic exercises, Therapeutic activity, Neuromuscular re-education, Patient/Family education, Self Care, Joint mobilization, Aquatic Therapy, Dry Needling, Spinal mobilization, Cryotherapy, Moist heat, Taping, Manual therapy, and Re-evaluation  PLAN FOR NEXT SESSION: continue per protocol, joint ROM and mobilizations; progressive scapular, cuff, and biceps strength. Light weight, full ROM challenges with scapular control.     Ann Lions PT DPT PN2  03/10/2022, 10:12 AM  Referring diagnosis? Z30.076A (ICD-10-CM) - Traumatic complete tear of left rotator cuff, initial encounter Treatment diagnosis? (if different than referring diagnosis) M25.512 M25.612, M62.81 What was this (referring dx) caused by? _0  Surgery _1  Fall _2  Ongoing issue _3  Arthritis _4  Other: ____________   Laterality: _5  Rt _6  Lt _7  Both   Check all possible CPT codes:             *CHOOSE 10 OR LESS*                          _8  97110 (Therapeutic Exercise)             _9  92507 (SLP Treatment)  _10  26333 (Neuro Re-ed)                           _11   92526 (Swallowing Treatment)             _0  [phone] (Gait Training)                           _1  [id number] (Cognitive Training, 1st 15 minutes) _2  97140 (Manual Therapy)                                _3  97130 (Cognitive Training, each add'l 15 minutes)   _4  97164 (Re-evaluation)                              _5  Other, List CPT Code ____________  _6  25750 (Therapeutic Activities)                                    _7  51833 (Self Care)                       _8  All codes above (97110 - 97535)            _9  58251 (Mechanical Traction)            _10  97014 (E-stim Unattended)            _11  97032 (E-stim manual)            _12  97033 (Ionto)            _13  97035 (Ultrasound) _14  97750 (Physical Performance Training) _15  [id number] (Aquatic Therapy) _16  97016 (Vasopneumatic Device) _17  89842 (Paraffin) _18  97034 (Contrast Bath) _19  97597 (Wound Care 1st 20 sq cm) _20  97598 (Wound Care each add'l 20 sq cm) _21  97760 (Orthotic Fabrication, Fitting, Training Initial) _22  [id number] (Prosthetic Management and Training Initial) _23  [id number] (Orthotic or Prosthetic Training/ Modification Subsequent)

## 2022-03-10 NOTE — Telephone Encounter (Signed)
Per referral patient informed that referral was sent to [facility] and was provided contact information.

## 2022-03-10 NOTE — Telephone Encounter (Signed)
Pt called to inform MD that she has still not heard from the ENT. Pt states it has been over a week.  Please advise.

## 2022-03-11 DIAGNOSIS — H401131 Primary open-angle glaucoma, bilateral, mild stage: Secondary | ICD-10-CM | POA: Diagnosis not present

## 2022-03-16 ENCOUNTER — Other Ambulatory Visit: Payer: Self-pay | Admitting: Family Medicine

## 2022-03-16 DIAGNOSIS — R490 Dysphonia: Secondary | ICD-10-CM | POA: Diagnosis not present

## 2022-03-16 DIAGNOSIS — J3801 Paralysis of vocal cords and larynx, unilateral: Secondary | ICD-10-CM | POA: Diagnosis not present

## 2022-03-17 ENCOUNTER — Other Ambulatory Visit: Payer: Self-pay | Admitting: Otolaryngology

## 2022-03-17 ENCOUNTER — Encounter (HOSPITAL_BASED_OUTPATIENT_CLINIC_OR_DEPARTMENT_OTHER): Payer: Self-pay | Admitting: Physical Therapy

## 2022-03-17 ENCOUNTER — Ambulatory Visit (HOSPITAL_BASED_OUTPATIENT_CLINIC_OR_DEPARTMENT_OTHER): Payer: Medicare HMO | Admitting: Physical Therapy

## 2022-03-17 DIAGNOSIS — M25512 Pain in left shoulder: Secondary | ICD-10-CM | POA: Diagnosis not present

## 2022-03-17 DIAGNOSIS — G8929 Other chronic pain: Secondary | ICD-10-CM | POA: Diagnosis not present

## 2022-03-17 DIAGNOSIS — J3800 Paralysis of vocal cords and larynx, unspecified: Secondary | ICD-10-CM

## 2022-03-17 DIAGNOSIS — M6281 Muscle weakness (generalized): Secondary | ICD-10-CM

## 2022-03-17 DIAGNOSIS — M25612 Stiffness of left shoulder, not elsewhere classified: Secondary | ICD-10-CM | POA: Diagnosis not present

## 2022-03-17 DIAGNOSIS — R293 Abnormal posture: Secondary | ICD-10-CM | POA: Diagnosis not present

## 2022-03-17 NOTE — Therapy (Signed)
OUTPATIENT PHYSICAL THERAPY SHOULDER RE-EVAL/RECERT     Patient Name: Heidi Shah MRN: [id number] DOB:[birth year], 76 y.o., female Today's Date: 03/17/2022   PT End of Session - 03/17/22 0935     Visit Number 15    Number of Visits 25    Date for PT Re-Evaluation 05/01/21    Authorization Type [employer] MCR    PT Start Time 0934    PT Stop Time 1014    PT Time Calculation (min) 40 min    Activity Tolerance Patient tolerated treatment well    Behavior During Therapy WFL for tasks assessed/performed                       Past Medical History:  Diagnosis Date   Allergy    Anxiety    Arthritis    Cataract    surgery - Bilateral cataracts removed   Chicken pox    Colon polyps    Depression    Diverticulitis    Diverticulosis    Esophageal stricture    Family history of adverse reaction to anesthesia    difficult time waking mother up   GERD (gastroesophageal reflux disease)    Heart murmur    Hypertension    Past Surgical History:  Procedure Laterality Date   cataract surgery Bilateral 1996   COLONOSCOPY  2022   EYE SURGERY  2000   detached L retina   HERNIA REPAIR Bilateral 1990   inguinal   NECK SURGERY     as a child   POLYPECTOMY  2022   SHOULDER ARTHROSCOPY WITH ROTATOR CUFF REPAIR AND OPEN BICEPS TENODESIS Left 12/10/2021   Procedure: LEFT SHOULDER ARTHROSCOPY WITH ROTATOR CUFF REPAIR AND OPEN BICEPS TENODESIS;  Surgeon: Vanetta Mulders, MD;  Location: [place];  Service: Orthopedics;  Laterality: Left;   UPPER GASTROINTESTINAL ENDOSCOPY  2022   Patient Active Problem List   Diagnosis Date Noted   Traumatic complete tear of left rotator cuff    Cervical spondylosis with myelopathy and radiculopathy 03/14/2020   Neck pain 03/14/2020   Aortic heart murmur 02/25/2020   Osteoporosis 04/04/2019   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   Unspecified constipation 03/05/2013   GERD (gastroesophageal reflux disease) 01/19/2013   Esophageal spasm  01/19/2013   Dysphagia, unspecified(787.20) 01/19/2013   Diverticulitis of colon (without mention of hemorrhage)(562.11) 11/23/2012   History of depression 11/23/2012   Essential hypertension, benign 11/23/2012    REFERRING PROVIDER: Vanetta Mulders, MD  REFERRING DIAG: Postop, left shoulder rotator cuff repair PROCEDURE: Arthroscopic limited debridement - 29822 Arthroscopic subacromial decompression - 15953 Arthroscopic rotator cuff repair - 96728 Arthroscopic biceps tenodesis - 97915  THERAPY DIAG:  Acute pain of left shoulder  Muscle weakness (generalized)  Rationale for Evaluation and Treatment Rehabilitation  ONSET DATE: DOS 12/10/21  Days since surgery: 97   SUBJECTIVE:  SUBJECTIVE STATEMENT:  I want it to be 100%. When I try to reach up into a cabinet, I can do it but it makes it sore.   PERTINENT HISTORY: H/o neck pain, OP  PAIN:  Are you having pain? No  PRECAUTIONS: none  WEIGHT BEARING RESTRICTIONS Yes shoulder  FALLS:  Has patient fallen in last 6 months? 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  PLOF: Independent  PATIENT GOALS strengthen arm  OBJECTIVE:   PATIENT SURVEYS:  FOTO 24  FOTO 11/16  AROM LEFT 11/7  L  11/9 L 11/28 L 12/13  Shoulder flexion 130 135 140 150* supine   Shoulder extension      Shoulder abduction 90 100 125 150* supine   Shoulder adduction      Shoulder internal rotation  L5 reach L3 reach T9 reach   Shoulder external rotation 56 C3 reach C5 reach C7 reach   (Blank rows = not tested)   MMT R  L  11/16 L 12/13  Shoulder flexion 4+/5 4-/5 4+/5  Shoulder extension 4+/5 4/5 4+/5  Shoulder abduction 4+/5 4-/5 3/5 discomfort    Shoulder adduction 4+/5    Shoulder internal rotation 4+/5 4+/5 4+/5  Shoulder external rotation 4+/5 4/5 4+/5     TODAY'S TREATMENT:    Treatment                            03/17/22:  UBE - Standing Shoulder Flexion Full Range  - 1 x daily - 7 x weekly - 3 sets - 5 reps - Standing Shoulder Scaption  - Standing Shoulder Abduction Full Range  - Wall Push Up  - 1 x daily - 7 x weekly - 3 sets - 10 reps - Bent Over Tricep Extension with Counter Support  - 1 x daily - 7 x weekly - 3 sets - 10 reps - Standing Shoulder External Rotation with Dumbbell  - 1 x daily - 7 x weekly - 3 sets - 10 reps - Standing Bicep Curls Supinated with Dumbbells  - 1 x daily - 7 x weekly - 3 sets - 10 reps - Standing Bicep Curls Neutral with Dumbbells  - 1 x daily - 7 x weekly - 3 sets - 10 reps - Side Plank with Arm Lift  - 1 x daily - 7 x weekly - 3 sets - 10 reps   05/39/76  Re-assess/recert with objective measures + appropriate education  FOTO 66   TherEx  UBE L1 x3 minutes forward/x3 backward Supine shoulder flexion red TB x12  Supine shoulder ABD yellow TB to 90* x12    03/01/22  TherEx  Supine AAROM into flexion x15 with 3 second holds Supine AAROM into abduction x15 with 3 second holds Seated shoulder ER AROM x15 towel under elbow  Seated L shoulder AROM x10 flexion and abduction Pillowcase AROM slides for abduction and flexion 10x10 seconds each   Manual  Grade II-III mobs to L GH joint for flexion/abduction       Treatment                            02/25/22:  Pulleys in abd- PT applied scapular stop for subscap stretch Scaption stretch in seated with physioball Elbow pn red physiobal +1lb, ER to abd press overhead Biceps curl  to [facility] press 1lb Sidelying horiz abd, abduction to 90, abd to full range Low trap set liftoff of wall  MANUAL: STM to upper trap, pec minor/major, PA GHJ mobs at 90 abd, stretching: ER, IR, flexion   Treatment                             02/23/22:    Joint mob: L GHJ inf and posterior  glide grade III    Exercises -supine stargazer stretch 30s 3x Doorway pec 30s 3x GTB row 2x10 GTB ext 3x5 shoulder ER YTB 2x10 Standing scaption with golf club Pulley ABD discussed for home  Treatment                            02/04/22:    Joint mob: L GHJ inf and posterior  glide grade III    Exercises supine protraction 3x10 S/L ER 3x8 1lb S/L ABD 1lb 2x10 Supine ABC 1lb 2x Prone ext and Abd 210   PATIENT EDUCATION: Education details: goal review/progress/POC  Person educated: Patient Education method: Explanation, Demonstration, Tactile cues, Verbal cues, and Handouts Education comprehension: verbalized understanding, returned demonstration, verbal cues required, tactile cues required, and needs further education   HOME EXERCISE PROGRAM: [id number]  ASSESSMENT:  CLINICAL IMPRESSION: Days since surgery: 97  Pt has made significant progress toward functional use of UE. At this time we will shift to an appt every other week to finalize HEP for gross strengthening. Will also consider abdominal engagement and scoliosis in exercises as her operative shoulder is the depressed side in curve and could result in impingement if ignored.      OBJECTIVE IMPAIRMENTS decreased activity tolerance, decreased ROM, decreased strength, increased edema, increased muscle spasms, impaired flexibility, impaired UE functional use, improper body mechanics, postural dysfunction, and pain.   ACTIVITY LIMITATIONS carrying, lifting, sleeping, bed mobility, bathing, dressing, reach over head, hygiene/grooming, and caring for others  PARTICIPATION LIMITATIONS: meal prep, cleaning, laundry, driving, shopping, community activity, and occupation  PERSONAL FACTORS 1-2 comorbidities: h/o neck pain, OP  are also affecting patient's functional outcome.   REHAB POTENTIAL: Good  CLINICAL DECISION MAKING: Stable/uncomplicated  EVALUATION  COMPLEXITY: Low   GOALS: Goals reviewed with patient? Yes  SHORT TERM GOALS: Target date: 01/08/22  PROM goals met for 4 weeks (140 FF, 40 ER, 60 ABD) Baseline: see obj Goal status: 12/13- MET   2.  Average pain <=4/10 Baseline: none at rest at eval Goal status: achieved    LONG TERM GOALS: Target date: 03/12/22  Will tolerate beginning light resistance exercises to shoulder Baseline: unable at eval Goal status: achieved Target date: 02/04/22 (8 weeks)  2.  Able to be out of her sling during the day, pain <=4/10 Baseline: unable at eval Goal status: achieved Target date: 02/04/22 (8 weeks)  3.  Able to dress and fix hair independently and without pain Baseline: unable at eval Goal status: met Target date: 03/04/22 (12 weeks)  4.  AROM within 5 deg of opp UE Baseline: unable at eval Goal status: PARTIALLY MET  Target date: 12/13- met in flexion, still tight in abd but all AROM functional  5.   MMT to be 5/5 in all tested groups in surgical shoulder  Baseline:  Goal status: INITIAL      PLAN: PT FREQUENCY: 1x/week  PT DURATION: 6 weeks  PLANNED INTERVENTIONS: Therapeutic exercises, Therapeutic activity, Neuromuscular re-education, Patient/Family education,  Self Care, Joint mobilization, Aquatic Therapy, Dry Needling, Spinal mobilization, Cryotherapy, Moist heat, Taping, Manual therapy, and Re-evaluation  PLAN FOR NEXT SESSION: qped, Rt oblique stability to scoliosis    Myrical Andujo C. Betty Brooks PT, DPT 03/17/22 12:33 PM   Referring diagnosis? Y17.494W (ICD-10-CM) - Traumatic complete tear of left rotator cuff, initial encounter Treatment diagnosis? (if different than referring diagnosis) M25.512 M25.612, M62.81 What was this (referring dx) caused by? _0  Surgery _1  Fall _2  Ongoing issue _3  Arthritis _4  Other: ____________   Laterality: _5  Rt _6  Lt _7  Both   Check all possible CPT codes:             *CHOOSE 10 OR LESS*                          _8   97110 (Therapeutic Exercise)             _9  92507 (SLP Treatment)  _10  96759 (Neuro Re-ed)                           _11  92526 (Swallowing Treatment)             _12  16384 (Gait Training)                           _13  66599 (Cognitive Training, 1st 15 minutes) _14  97140 (Manual Therapy)                                _15  97130 (Cognitive Training, each add'l 15 minutes)   _16  97164 (Re-evaluation)                              _17  Other, List CPT Code ____________  _18  35701 (Therapeutic Activities)                                    _19  77939 (Self Care)                      _20  All codes above (97110 - 97535)            _21  97012 (Mechanical Traction)            _22  97014 (E-stim Unattended)            _23  97032 (E-stim manual)            _24  97033 (Ionto)            _25  97035 (Ultrasound) _26  97750 (Physical Performance Training) _27  03009 (Aquatic Therapy) _28  97016 (Vasopneumatic Device) _29  23300 (Paraffin) _30  76226 (Contrast Bath) _31  97597 (Wound Care 1st 20 sq cm) _32  97598 (Wound Care each add'l 20 sq cm) _33  97760 (Orthotic Fabrication, Fitting, Training Initial) _34  [id number] (Prosthetic Management and Training Initial) _35  [id number] (Orthotic or Prosthetic Training/ Modification Subsequent)

## 2022-03-23 IMAGING — MG MM DIGITAL SCREENING BILAT W/ TOMO AND CAD
8 series · 9 of 24 positions shown · non-contrast
Comparison: Previous exam(s).

CLINICAL DATA: Screening.

EXAM:
DIGITAL SCREENING BILATERAL MAMMOGRAM WITH TOMOSYNTHESIS AND CAD
TECHNIQUE: Bilateral screening digital craniocaudal and mediolateral oblique
mammograms were obtained. Bilateral screening digital breast
tomosynthesis was performed. The images were evaluated with
computer-aided detection.

[L MLO synth-2D]
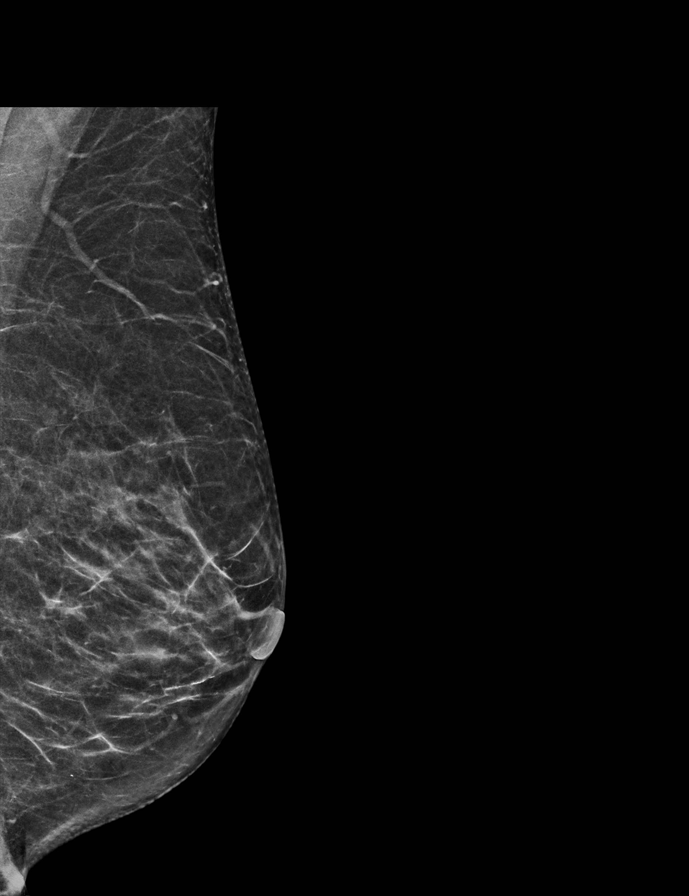

[R MLO synth-2D]
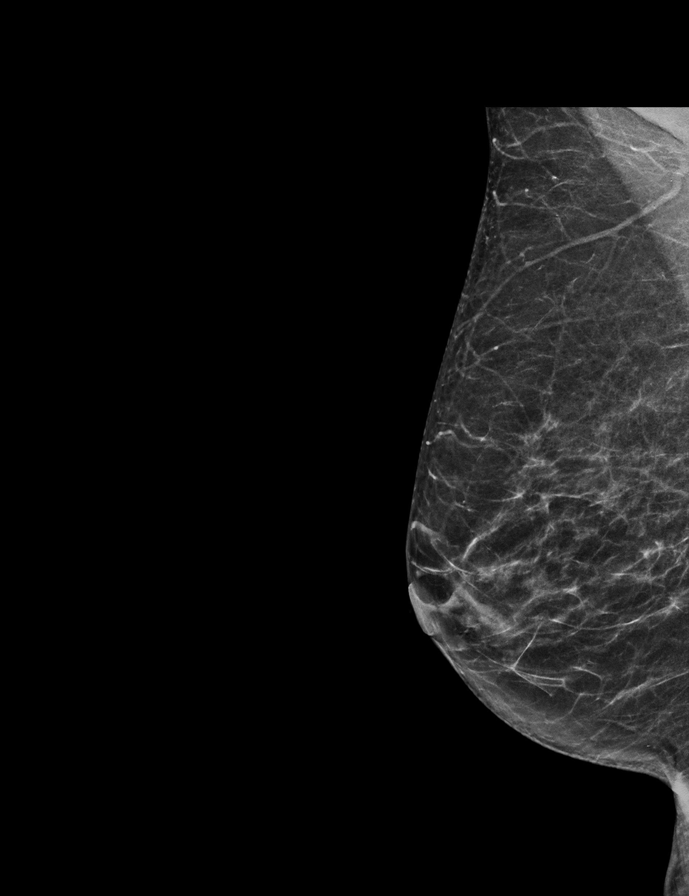

[L CC synth-2D]
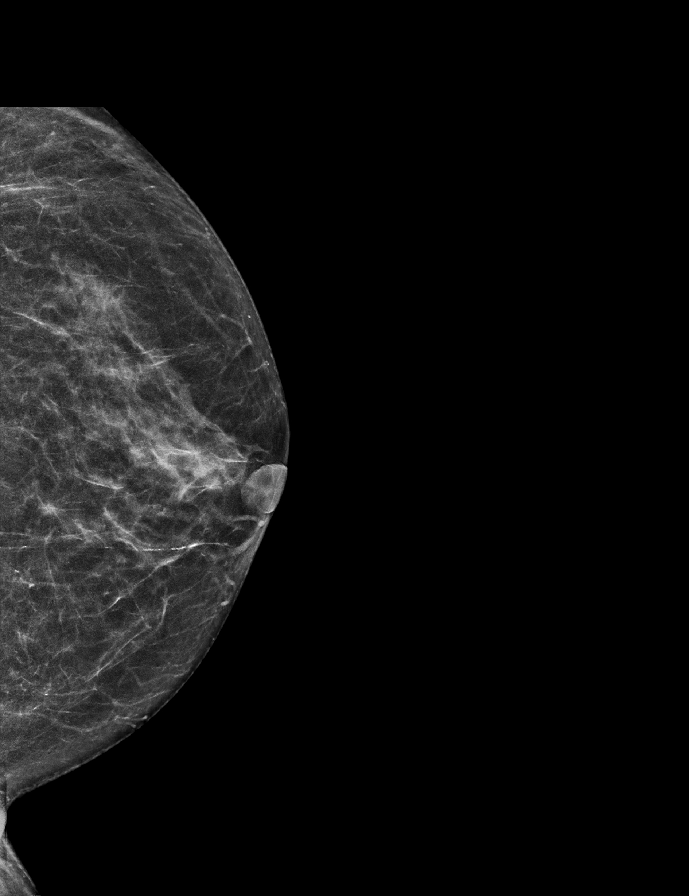

[R CC synth-2D]
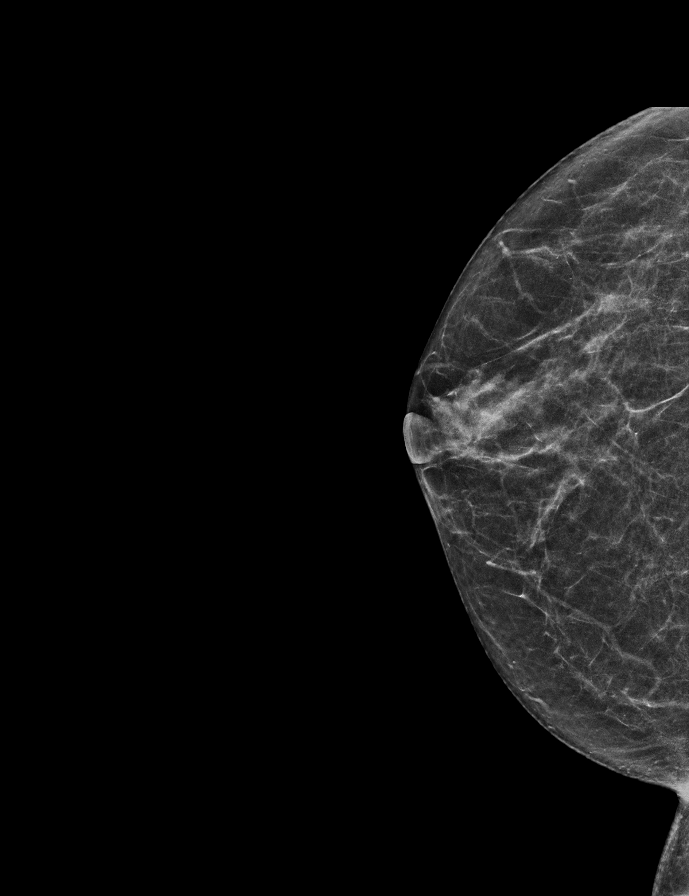

[L CC tomo · 2 of 57 frames shown]
[frame 19/57]
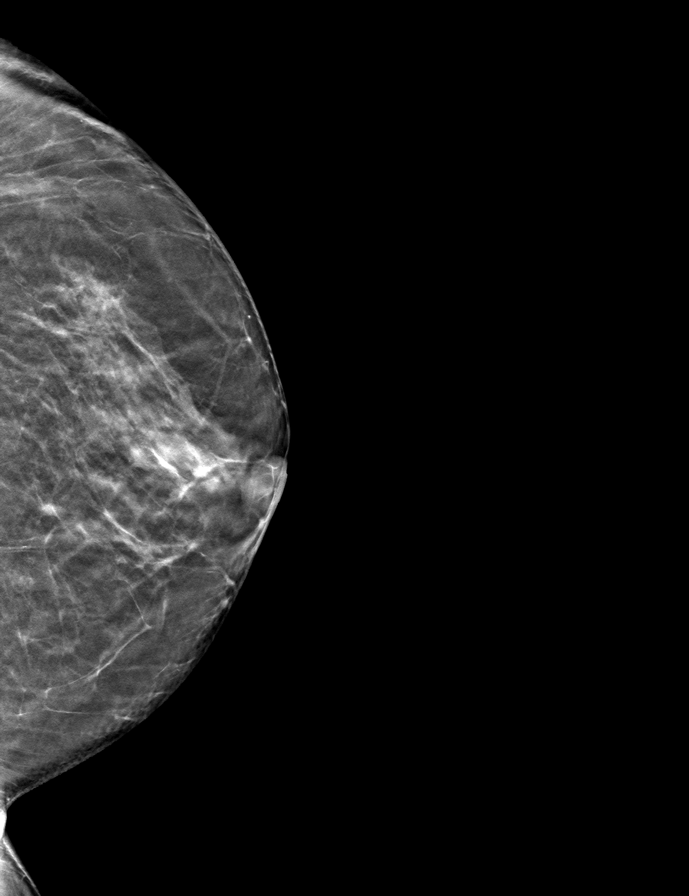
[frame 29/57]
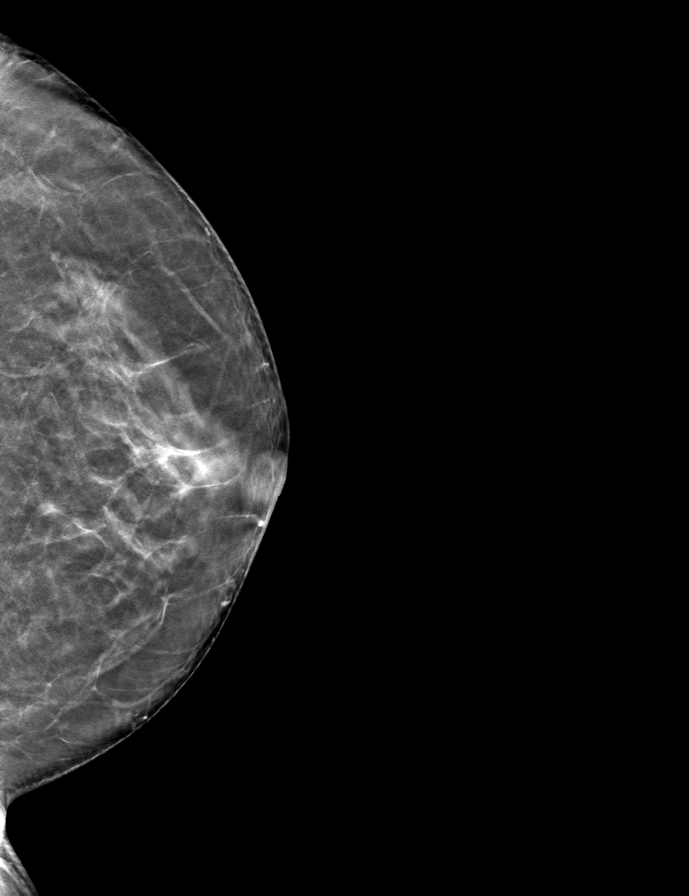

[L MLO tomo · tomo slice 27/53.0]
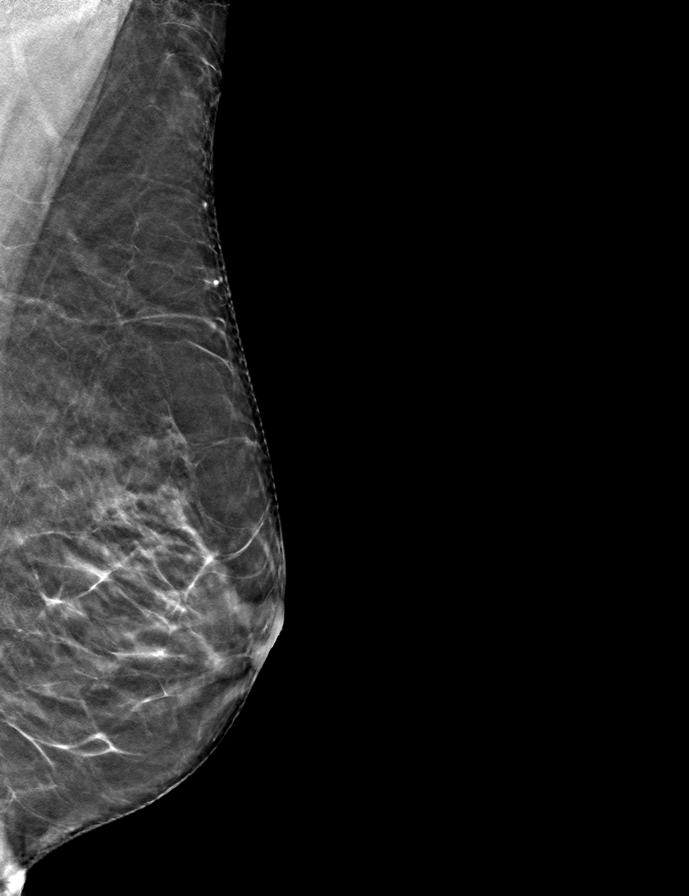

[R CC tomo · tomo slice 25/49.0]
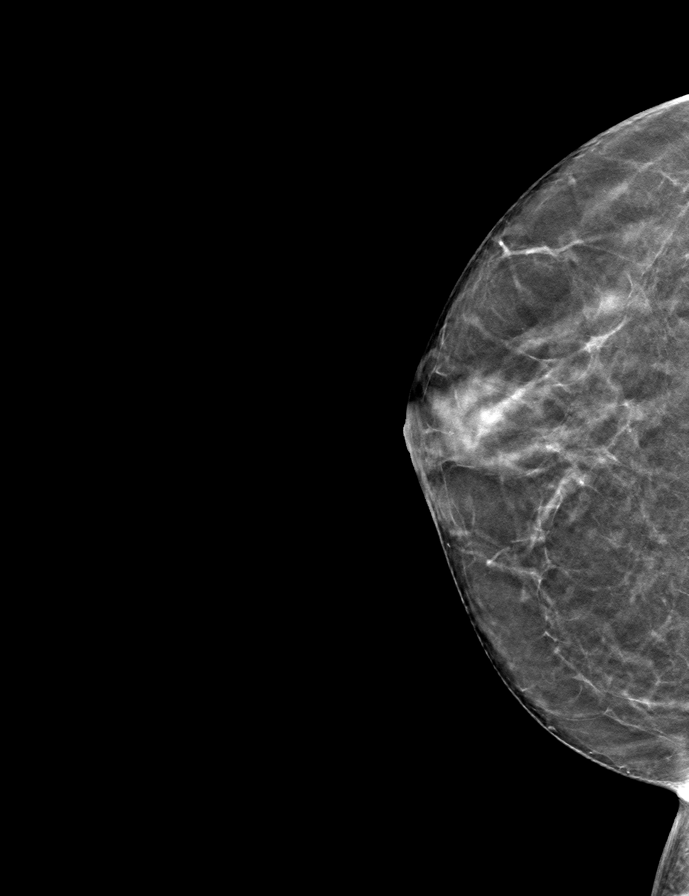

[R MLO tomo · tomo slice 28/55.0]
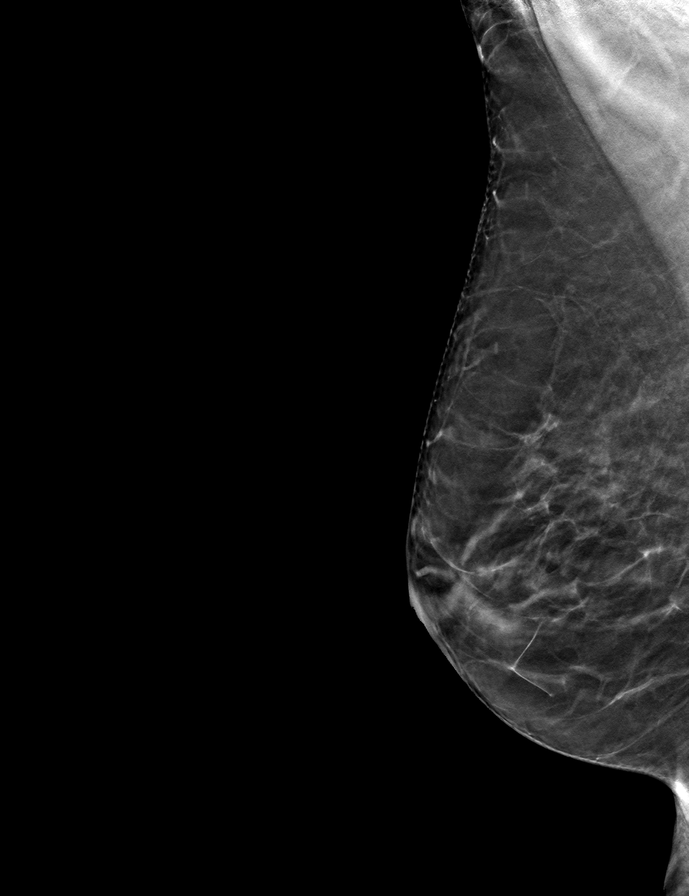

[9 of 24 positions shown; findings below may reference images not displayed]

ACR Breast Density Category b: There are scattered areas of
fibroglandular density.
FINDINGS: In the left breast, a possible asymmetry warrants further
evaluation. In the right breast, no findings suspicious for
malignancy.
IMPRESSION: Further evaluation is suggested for possible asymmetry in the left
breast.

RECOMMENDATION:
Diagnostic mammogram and possibly ultrasound of the left breast.
(Code:[id number])

The patient will be contacted regarding the findings, and additional
imaging will be scheduled.

BI-RADS CATEGORY  0: Incomplete. Need additional imaging evaluation
and/or prior mammograms for comparison.

## 2022-03-24 ENCOUNTER — Encounter (HOSPITAL_BASED_OUTPATIENT_CLINIC_OR_DEPARTMENT_OTHER): Payer: Medicare HMO | Admitting: Physical Therapy

## 2022-03-25 ENCOUNTER — Encounter (HOSPITAL_BASED_OUTPATIENT_CLINIC_OR_DEPARTMENT_OTHER): Payer: Medicare HMO | Admitting: Physical Therapy

## 2022-04-01 ENCOUNTER — Encounter (HOSPITAL_BASED_OUTPATIENT_CLINIC_OR_DEPARTMENT_OTHER): Payer: Self-pay | Admitting: Physical Therapy

## 2022-04-01 ENCOUNTER — Ambulatory Visit (HOSPITAL_BASED_OUTPATIENT_CLINIC_OR_DEPARTMENT_OTHER): Payer: Medicare HMO | Attending: Family Medicine | Admitting: Physical Therapy

## 2022-04-01 DIAGNOSIS — M25512 Pain in left shoulder: Secondary | ICD-10-CM | POA: Insufficient documentation

## 2022-04-01 DIAGNOSIS — M25612 Stiffness of left shoulder, not elsewhere classified: Secondary | ICD-10-CM | POA: Diagnosis not present

## 2022-04-01 DIAGNOSIS — M6281 Muscle weakness (generalized): Secondary | ICD-10-CM | POA: Diagnosis not present

## 2022-04-01 NOTE — Therapy (Signed)
OUTPATIENT PHYSICAL THERAPY SHOULDER RE-EVAL/RECERT     Patient Name: Heidi Shah MRN: [id number] DOB:1945-11-17, 77 y.o., female Today's Date: 04/01/2022   PT End of Session - 04/01/22 1342     Visit Number 16    Number of Visits 25    Date for PT Re-Evaluation 05/01/21    Authorization Type [employer] M[employer]    PT Start Time 1343    PT Stop Time 1410    PT Time Calculation (min) 27 min    Activity Tolerance Patient tolerated treatment well    Behavior During Therapy WFL for tasks assessed/performed                       Past Medical History:  Diagnosis Date   Allergy    Anxiety    Arthritis    Cataract    surgery - Bilateral cataracts removed   Chicken pox    Colon polyps    Depression    Diverticulitis    Diverticulosis    Esophageal stricture    Family history of adverse reaction to anesthesia    difficult time waking mother up   GERD (gastroesophageal reflux disease)    Heart murmur    Hypertension    Past Surgical History:  Procedure Laterality Date   cataract surgery Bilateral 1996   COLONOSCOPY  2022   EYE SURGERY  2000   detached L retina   HERNIA REPAIR Bilateral 1990   inguinal   NECK SURGERY     as a child   POLYPECTOMY  2022   SHOULDER ARTHROSCOPY WITH ROTATOR CUFF REPAIR AND OPEN BICEPS TENODESIS Left 12/10/2021   Procedure: LEFT SHOULDER ARTHROSCOPY WITH ROTATOR CUFF REPAIR AND OPEN BICEPS TENODESIS;  Surgeon: Vanetta Mulders, MD;  Location: [facility];  Service: Orthopedics;  Laterality: Left;   UPPER GASTROINTESTINAL ENDOSCOPY  2022   Patient Active Problem List   Diagnosis Date Noted   Traumatic complete tear of left rotator cuff    Cervical spondylosis with myelopathy and radiculopathy 03/14/2020   Neck pain 03/14/2020   Aortic heart murmur 02/25/2020   Osteoporosis 04/04/2019   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   Unspecified constipation 03/05/2013   GERD (gastroesophageal reflux disease) 01/19/2013   Esophageal spasm  01/19/2013   Dysphagia, unspecified(787.20) 01/19/2013   Diverticulitis of colon (without mention of hemorrhage)(562.11) 11/23/2012   History of depression 11/23/2012   Essential hypertension, benign 11/23/2012    REFERRING PROVIDER: Vanetta Mulders, MD  REFERRING DIAG: Postop, left shoulder rotator cuff repair PROCEDURE: Arthroscopic limited debridement - 29822 Arthroscopic subacromial decompression - 29826 Arthroscopic rotator cuff repair - 29827 Arthroscopic biceps tenodesis - 77414  THERAPY DIAG:  Acute pain of left shoulder  Muscle weakness (generalized)  Stiffness of left shoulder, not elsewhere classified  Rationale for Evaluation and Treatment Rehabilitation  ONSET DATE: DOS 12/10/21  Days since surgery: 112   SUBJECTIVE:  SUBJECTIVE STATEMENT:  No neck pain. I do get quick sharp pain sometimes with using my arm.   PERTINENT HISTORY: H/o neck pain, OP  PAIN:  Are you having pain? No  PRECAUTIONS: none  WEIGHT BEARING RESTRICTIONS Yes shoulder  FALLS:  Has patient fallen in last 6 months? 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  Shoulder adduction 4+/5     Shoulder internal rotation 4+/5 4+/5 4+/5   Shoulder external rotation 4+/5 4/5 4+/5      TODAY'S TREATMENT:   Treatment                            04/01/22:  Qped: alt UE flexion, bird dog, lawn mower Wall push ups Low trap set from wall Arm circles Triceps kicks   Treatment                            03/17/22:  UBE - Standing Shoulder Flexion Full Range  - 1 x daily - 7 x weekly - 3 sets - 5 reps - Standing Shoulder Scaption  - Standing Shoulder Abduction Full Range  - Wall Push Up  - 1 x daily - 7 x weekly - 3 sets - 10 reps - Bent Over Tricep Extension with Counter Support  - 1 x daily - 7 x weekly - 3 sets - 10 reps - Standing Shoulder External Rotation with Dumbbell  - 1 x daily - 7 x weekly - 3 sets - 10 reps - Standing Bicep Curls Supinated with Dumbbells  - 1 x daily - 7 x weekly - 3 sets - 10 reps - Standing Bicep Curls Neutral with Dumbbells  - 1 x daily - 7 x weekly - 3 sets - 10 reps - Side Plank with Arm Lift  - 1 x daily - 7 x weekly - 3 sets - 10 reps   09/81/19  Re-assess/recert with objective measures + appropriate education  FOTO 66   TherEx  UBE L1 x3 minutes forward/x3 backward Supine shoulder flexion red TB x12  Supine shoulder ABD yellow TB to 90* x12     PATIENT EDUCATION: Education details: goal review/progress/POC  Person educated: Patient Education method: Explanation, Demonstration, Tactile cues, Verbal cues, and Handouts Education comprehension: verbalized understanding, returned demonstration, verbal cues required, tactile cues required, and needs further education   HOME EXERCISE PROGRAM: [id number]  ASSESSMENT:  CLINICAL IMPRESSION: Days since surgery: 112  Overall pt is doing very well, still some weakness as would be expected at this point but able to demo full ROM.going  out of town to care for grand children and will f/u on her return.       OBJECTIVE IMPAIRMENTS decreased activity tolerance, decreased ROM, decreased strength, increased edema, increased muscle spasms, impaired flexibility, impaired UE functional use, improper body mechanics, postural dysfunction, and pain.   ACTIVITY LIMITATIONS carrying, lifting, sleeping, bed mobility, bathing, dressing, reach over head, hygiene/grooming, and caring for others  PARTICIPATION LIMITATIONS: meal prep, cleaning, laundry, driving, shopping, community activity, and occupation  PERSONAL FACTORS 1-2 comorbidities: h/o neck pain, OP  are also affecting patient's functional outcome.   REHAB POTENTIAL: Good  CLINICAL DECISION MAKING: Stable/uncomplicated  EVALUATION COMPLEXITY: Low   GOALS: Goals reviewed with patient? Yes  SHORT TERM GOALS: Target date: 01/08/22  PROM goals met for 4 weeks (140 FF, 40 ER, 60 ABD) Baseline: see obj Goal status: 12/13- MET   2.  Average pain <=4/10 Baseline: none at rest at eval Goal status: achieved    LONG TERM GOALS: Target date: 03/12/22  Will tolerate beginning light resistance exercises to shoulder Baseline: unable at eval Goal status: achieved Target date: 02/04/22 (8 weeks)  2.  Able to be out of her sling during the day, pain <=4/10 Baseline: unable at eval Goal status: achieved Target date: 02/04/22 (8 weeks)  3.  Able to dress and fix hair independently and without pain Baseline: unable at eval Goal status: met Target date: 03/04/22 (12 weeks)  4.  AROM within 5 deg of opp UE Baseline: unable at eval Goal status: PARTIALLY MET  Target date: 12/13- met in flexion, still tight in abd but all AROM functional  5.   MMT to be 5/5 in all tested groups in surgical shoulder  Baseline:  Goal status: INITIAL      PLAN: PT FREQUENCY: 1x/week  PT DURATION: 6 weeks  PLANNED INTERVENTIONS: Therapeutic exercises, Therapeutic activity, Neuromuscular re-education, Patient/Family education, Self Care, Joint  mobilization, Aquatic Therapy, Dry Needling, Spinal mobilization, Cryotherapy, Moist heat, Taping, Manual therapy, and Re-evaluation  PLAN FOR NEXT SESSION: qped, Rt oblique stability to scoliosis    Korinne Greenstein C. Contessa Preuss PT, DPT 04/01/22 2:15 PM   Referring diagnosis? U54.270W (ICD-10-CM) - Traumatic complete tear of left rotator cuff, initial encounter Treatment diagnosis? (if different than referring diagnosis) M25.512 M25.612, M62.81 What was this (referring dx) caused by? _0  Surgery _1  Fall _2  Ongoing issue _3  Arthritis _4  Other: ____________   Laterality: _5  Rt _6  Lt _7  Both   Check all possible CPT codes:             *CHOOSE 10 OR LESS*                          _8  97110 (Therapeutic Exercise)             _9  92507 (SLP Treatment)  _10  23762 (Neuro Re-ed)                           _11  83151 (Swallowing Treatment)             _12  76160 (Gait Training)                           _13  73710 (Cognitive Training, 1st 15 minutes) _14  97140 (Manual Therapy)                                _15   44975 (Cognitive Training, each add'l 15 minutes)   _0  [id number] (Re-evaluation)                              _1  Other, List CPT Code ____________  _2  30051 (Therapeutic Activities)                                    _3  10211 (Self Care)                      _4  All codes above (97110 - 97535)            _5  17356 (Mechanical Traction)            _6  97014 (E-stim Unattended)            _7  97032 (E-stim manual)            _8  97033 (Ionto)            _9  70141 (Ultrasound) _10  97750 (Physical Performance Training) _11  03013 (Aquatic Therapy) _12  97016 (Vasopneumatic Device) _13  14388 (Paraffin) _14  97034 (Contrast Bath) _15  97597 (Wound Care 1st 20 sq cm) _16  97598 (Wound Care each add'l 20 sq cm) _17  97760 (Orthotic Fabrication, Fitting, Training Initial) _18  [id number] (Prosthetic Management and Training Initial) _19  [id number] (Orthotic or Prosthetic Training/ Modification Subsequent)

## 2022-04-06 ENCOUNTER — Ambulatory Visit
Admission: RE | Admit: 2022-04-06 | Discharge: 2022-04-06 | Disposition: A | Discharge status: Home / Self Care | Payer: Medicare HMO | Source: Ambulatory Visit | Attending: Otolaryngology | Admitting: Otolaryngology

## 2022-04-06 DIAGNOSIS — J9811 Atelectasis: Secondary | ICD-10-CM | POA: Diagnosis not present

## 2022-04-06 DIAGNOSIS — J3800 Paralysis of vocal cords and larynx, unspecified: Secondary | ICD-10-CM

## 2022-04-06 DIAGNOSIS — K314 Gastric diverticulum: Secondary | ICD-10-CM | POA: Diagnosis not present

## 2022-04-06 DIAGNOSIS — M5030 Other cervical disc degeneration, unspecified cervical region: Secondary | ICD-10-CM | POA: Diagnosis not present

## 2022-04-06 DIAGNOSIS — J3489 Other specified disorders of nose and nasal sinuses: Secondary | ICD-10-CM | POA: Diagnosis not present

## 2022-04-06 DIAGNOSIS — K225 Diverticulum of esophagus, acquired: Secondary | ICD-10-CM | POA: Diagnosis not present

## 2022-04-06 DIAGNOSIS — M419 Scoliosis, unspecified: Secondary | ICD-10-CM | POA: Diagnosis not present

## 2022-04-06 DIAGNOSIS — I6529 Occlusion and stenosis of unspecified carotid artery: Secondary | ICD-10-CM | POA: Diagnosis not present

## 2022-04-06 MED ORDER — IOPAMIDOL (ISOVUE-300) INJECTION 61%
85.0000 mL | Freq: Once | INTRAVENOUS | Status: AC | PRN
  Administered 2022-04-06: 85 mL via INTRAVENOUS

## 2022-04-07 ENCOUNTER — Other Ambulatory Visit: Payer: Self-pay | Admitting: Otolaryngology

## 2022-04-07 DIAGNOSIS — J3800 Paralysis of vocal cords and larynx, unspecified: Secondary | ICD-10-CM

## 2022-04-08 ENCOUNTER — Encounter (HOSPITAL_BASED_OUTPATIENT_CLINIC_OR_DEPARTMENT_OTHER): Payer: Medicare HMO | Admitting: Physical Therapy

## 2022-04-15 ENCOUNTER — Encounter (HOSPITAL_BASED_OUTPATIENT_CLINIC_OR_DEPARTMENT_OTHER): Payer: Medicare HMO | Admitting: Physical Therapy

## 2022-04-21 ENCOUNTER — Ambulatory Visit (INDEPENDENT_AMBULATORY_CARE_PROVIDER_SITE_OTHER): Payer: Medicare HMO | Admitting: Family Medicine

## 2022-04-21 ENCOUNTER — Encounter: Payer: Self-pay | Admitting: Family Medicine

## 2022-04-21 VITALS — BP 112/80 | HR 91 | Temp 98.9°F | Wt 117.4 lb

## 2022-04-21 DIAGNOSIS — J019 Acute sinusitis, unspecified: Secondary | ICD-10-CM | POA: Diagnosis not present

## 2022-04-21 MED ORDER — AZITHROMYCIN 250 MG PO TABS: ORAL_TABLET | ORAL | 0 refills | Status: DC

## 2022-04-21 NOTE — Progress Notes (Signed)
   Subjective:    Patient ID: Heidi Shah, female    DOB: 04/21/1945, 77 y.o.   MRN: [id number]  HPI Here for 5 days of sinus congestion, PND, hoarseness, and a dry cough. No fever or ST or SOB.    Review of Systems  Constitutional: Negative.   HENT:  Positive for congestion, postnasal drip, sinus pressure and voice change. Negative for ear pain and sore throat.   Eyes: Negative.   Respiratory:  Positive for cough. Negative for shortness of breath and wheezing.        Objective:   Physical Exam Constitutional:      Appearance: Normal appearance.     Comments: Her voice is hoarseness   HENT:     Right Ear: Tympanic membrane, ear canal and external ear normal.     Left Ear: Tympanic membrane, ear canal and external ear normal.     Nose: Nose normal.     Mouth/Throat:     Pharynx: Oropharynx is clear.  Eyes:     Conjunctiva/sclera: Conjunctivae normal.  Pulmonary:     Effort: Pulmonary effort is normal.     Breath sounds: Normal breath sounds.  Lymphadenopathy:     Cervical: No cervical adenopathy.  Neurological:     Mental Status: She is alert.           Assessment & Plan:  Sinusitis, treat with a Zpack. Add Mucinex as needed. Alysia Penna, MD

## 2022-04-22 ENCOUNTER — Encounter (HOSPITAL_BASED_OUTPATIENT_CLINIC_OR_DEPARTMENT_OTHER): Payer: Medicare HMO | Admitting: Physical Therapy

## 2022-04-22 ENCOUNTER — Ambulatory Visit: Payer: Medicare HMO | Admitting: Internal Medicine

## 2022-04-22 ENCOUNTER — Encounter: Payer: Self-pay | Admitting: Internal Medicine

## 2022-04-22 VITALS — BP 126/80 | HR 114 | Ht 59.0 in | Wt 116.0 lb

## 2022-04-22 DIAGNOSIS — K259 Gastric ulcer, unspecified as acute or chronic, without hemorrhage or perforation: Secondary | ICD-10-CM | POA: Diagnosis not present

## 2022-04-22 DIAGNOSIS — R1310 Dysphagia, unspecified: Secondary | ICD-10-CM | POA: Diagnosis not present

## 2022-04-22 DIAGNOSIS — K222 Esophageal obstruction: Secondary | ICD-10-CM

## 2022-04-22 DIAGNOSIS — K219 Gastro-esophageal reflux disease without esophagitis: Secondary | ICD-10-CM | POA: Diagnosis not present

## 2022-04-22 MED ORDER — OMEPRAZOLE 40 MG PO CPDR: 40.0000 mg | DELAYED_RELEASE_CAPSULE | Freq: Every day | ORAL | 3 refills | Status: DC

## 2022-04-22 NOTE — Progress Notes (Signed)
HISTORY OF PRESENT ILLNESS:  Heidi Shah is a 77 y.o. female, retired Radio producer, with a history of GERD complicated by peptic stricture, gastric ulcers, and surveillance colonoscopies.  She presents today for follow-up.  She has a chief complaint of recurrent intermittent solid food and occasional liquid dysphagia for which she has been dilated in the past.  She feels that she needs the same.  She continues on omeprazole 40 mg daily for her GERD.  For the most part, well-controlled.  She was last seen in the office Aug 01, 2020.  See that dictation.  She has aged out of colon cancer surveillance with her last examination being March 2022.  Her last upper endoscopy was also performed in March 2022.  At that time she was found to have a distal esophageal stricture and prepyloric ulcers.  Villous appearing tissue emanating from the distal portion of the ampulla was biopsied and returned showing just peptic duodenitis.  She was dilated with 54 [place] Maloney dilator.  This helped her dysphagia.  GI review of systems is otherwise negative.  She does report having problems with persistent hoarseness 2 weeks after left shoulder surgery.  She has been seen by ENT with ongoing evaluation.  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  Review of blood work from December 2023 shows unremarkable comprehensive metabolic panel with normal liver test.  Normal CBC with hemoglobin 13.7.  REVIEW OF SYSTEMS:  All non-GI ROS negative unless otherwise stated in the HPI except for cough, heart murmur  Past Medical History:  Diagnosis Date   Allergy    Anxiety    Arthritis    Cataract    surgery - Bilateral cataracts removed   Chicken pox    Colon polyps    Depression    Diverticulitis    Diverticulosis    Esophageal stricture    Family history of adverse reaction to anesthesia    difficult time waking mother up   GERD (gastroesophageal reflux disease)    Heart murmur    Hypertension     Past  Surgical History:  Procedure Laterality Date   cataract surgery Bilateral 1996   COLONOSCOPY  2022   EYE SURGERY  2000   detached L retina   HERNIA REPAIR Bilateral 1990   inguinal   NECK SURGERY     as a child   POLYPECTOMY  2022   SHOULDER ARTHROSCOPY WITH ROTATOR CUFF REPAIR AND OPEN BICEPS TENODESIS Left 12/10/2021   Procedure: LEFT SHOULDER ARTHROSCOPY WITH ROTATOR CUFF REPAIR AND OPEN BICEPS TENODESIS;  Surgeon: Vanetta Mulders, MD;  Location: [facility];  Service: Orthopedics;  Laterality: Left;   UPPER GASTROINTESTINAL ENDOSCOPY  2022    Social History Heidi Shah  reports that she has never smoked. She has never used smokeless tobacco. She reports current alcohol use of about 1.0 standard drink of alcohol per week. She reports that she does not use drugs.  family history includes COPD in her mother; Cancer in her father; Crohn's disease in her mother; Heart disease (age of onset: 53) in her mother; Heart disease (age of onset: 27) in her father.  Allergies  Allergen Reactions   Beeswax     Per skin test from dermatologist   Cetrimonium Chloride [Cetrimide]     Per skin test from dermatologist   Methylisothiazolinone     Per skin test from dermatologist   Neomycin Sulfate [Neomycin]     Per skin test from dermatologist   Nickel Sulfate [Nickel]  Per skin test from dermatologist   Penicillins     Childhood Reaction    Propolis     Per skin test from dermatologist       PHYSICAL EXAMINATION: Vital signs: BP 126/80   Pulse (!) 114   Ht '4\' 11"'$  (1.499 m)   Wt 116 lb (52.6 kg)   BMI 23.43 kg/m   Constitutional: generally well-appearing, no acute distress Psychiatric: alert and oriented x3, cooperative Eyes: extraocular movements intact, anicteric, conjunctiva pink Mouth: oral pharynx moist, no lesions Neck: supple no lymphadenopathy Cardiovascular: heart regular rate and rhythm Lungs: clear to auscultation bilaterally Abdomen: soft, nontender,  nondistended, no obvious ascites, no peritoneal signs, normal bowel sounds, no organomegaly Rectal: Omitted Extremities: no clubbing, cyanosis, or lower extremity edema bilaterally Skin: no lesions on visible extremities Neuro: No focal deficits.  Cranial nerves intact  ASSESSMENT:  1.  GERD complicated by peptic stricture.  Previously requiring dilation.  Currently on omeprazole 40 mg daily.  She request refill.  Classic reflux symptoms seemingly well-controlled. 2.  Recurrent dysphagia.  Likely secondary to recurrent peptic stricture.  Would benefit from dilation. 3.  History of colon polyps.  Colonoscopy 2022.  Aged out of surveillance   PLAN:  1.  Reflux precautions 2.  Refill omeprazole 40 mg daily; #90; 4 refills.  Medication risk reviewed 3.  Schedule upper endoscopy with esophageal dilation.The nature of the procedure, as well as the risks, benefits, and alternatives were carefully and thoroughly reviewed with the patient. Ample time for discussion and questions allowed. The patient understood, was satisfied, and agreed to proceed. 4.  Follow-up thereafter to be determined A total time of 30 minutes was spent preparing to see the patient, reviewing data, obtaining comprehensive history, performing medically appropriate physical examination, counseling and educating the patient regarding the above listed issues, ordering medication, ordering therapeutic endoscopic procedure, and documenting clinical information in the health record

## 2022-04-22 NOTE — Patient Instructions (Signed)
_______________________________________________________  If your blood pressure at your visit was 140/90 or greater, please contact your primary care physician to follow up on this.  _______________________________________________________  If you are age 75 or older, your body mass index should be between 23-30. Your Body mass index is 23.43 kg/m. If this is out of the aforementioned range listed, please consider follow up with your Primary Care Provider.  If you are age 48 or younger, your body mass index should be between 19-25. Your Body mass index is 23.43 kg/m. If this is out of the aformentioned range listed, please consider follow up with your Primary Care Provider.   ________________________________________________________  The [facility] providers would like to encourage you to use [facility] to communicate with providers for non-urgent requests or questions.  Due to long hold times on the telephone, sending your provider a message by [facility] may be a faster and more efficient way to get a response.  Please allow 48 business hours for a response.  Please remember that this is for non-urgent requests.  _______________________________________________________  We have sent the following medications to your pharmacy for you to pick up at your convenience:  Omeprazole  You have been scheduled for an endoscopy. Please follow written instructions given to you at your visit today. If you use inhalers (even only as needed), please bring them with you on the day of your procedure.

## 2022-04-29 ENCOUNTER — Ambulatory Visit (HOSPITAL_BASED_OUTPATIENT_CLINIC_OR_DEPARTMENT_OTHER): Payer: Medicare HMO | Attending: Family Medicine | Admitting: Physical Therapy

## 2022-04-29 ENCOUNTER — Encounter (HOSPITAL_BASED_OUTPATIENT_CLINIC_OR_DEPARTMENT_OTHER): Payer: Self-pay | Admitting: Physical Therapy

## 2022-04-29 ENCOUNTER — Ambulatory Visit (INDEPENDENT_AMBULATORY_CARE_PROVIDER_SITE_OTHER): Payer: Medicare HMO | Admitting: Orthopaedic Surgery

## 2022-04-29 DIAGNOSIS — S46012A Strain of muscle(s) and tendon(s) of the rotator cuff of left shoulder, initial encounter: Secondary | ICD-10-CM | POA: Diagnosis not present

## 2022-04-29 DIAGNOSIS — M25512 Pain in left shoulder: Secondary | ICD-10-CM

## 2022-04-29 DIAGNOSIS — M6281 Muscle weakness (generalized): Secondary | ICD-10-CM

## 2022-04-29 NOTE — Progress Notes (Signed)
Post Operative Evaluation    Procedure/Date of Surgery: 12/10/21 left shoulder rotator cuff repair and biceps tenodesis  Interval History:   Presents today status post above procedure.  Overall she is doing extremely well.  She is working on strengthening at this time.  She has no pain in her overhead motion is dramatically improved.  PMH/PSH/Family History/Social History/Meds/Allergies:    Past Medical History:  Diagnosis Date   Allergy    Anxiety    Arthritis    Cataract    surgery - Bilateral cataracts removed   Chicken pox    Colon polyps    Depression    Diverticulitis    Diverticulosis    Esophageal stricture    Family history of adverse reaction to anesthesia    difficult time waking mother up   GERD (gastroesophageal reflux disease)    Heart murmur    Hypertension    Past Surgical History:  Procedure Laterality Date   cataract surgery Bilateral 1996   COLONOSCOPY  2022   EYE SURGERY  2000   detached L retina   HERNIA REPAIR Bilateral 1990   inguinal   NECK SURGERY     as a child   POLYPECTOMY  2022   SHOULDER ARTHROSCOPY WITH ROTATOR CUFF REPAIR AND OPEN BICEPS TENODESIS Left 12/10/2021   Procedure: LEFT SHOULDER ARTHROSCOPY WITH ROTATOR CUFF REPAIR AND OPEN BICEPS TENODESIS;  Surgeon: Vanetta Mulders, MD;  Location: [facility];  Service: Orthopedics;  Laterality: Left;   UPPER GASTROINTESTINAL ENDOSCOPY  2022   Social History   Socioeconomic History   Marital status: Divorced    Spouse name: Not on file   Number of children: 2   Years of education: master's degree   Highest education level: Master's degree (e.g., MA, MS, MEng, MEd, MSW, MBA)  Occupational History   Occupation: retired Pharmacist, hospital    Comment: part - time Pharmacist, hospital  Tobacco Use   Smoking status: Never   Smokeless tobacco: Never  Vaping Use   Vaping Use: Never used  Substance and Sexual Activity   Alcohol use: Yes    Alcohol/week: 1.0 standard drink of  alcohol    Types: 1 Glasses of wine per week    Comment: one or two glasses of wine a couple times a week   Drug use: No   Sexual activity: Not on file  Other Topics Concern   Not on file  Social History Narrative      Lives alone in ranch home.    2 children   Son lives in [place]   Continues to teach virtually part time   Social Determinants of Health   Financial Resource Strain: Low Risk  (05/19/2021)   Overall Financial Resource Strain (CARDIA)    Difficulty of Paying Living Expenses: Not hard at all  Food Insecurity: No Food Insecurity (05/19/2021)   Hunger Vital Sign    Worried About Running Out of Food in the Last Year: Never true    Ran Out of Food in the Last Year: Never true  Transportation Needs: No Transportation Needs (05/19/2021)   PRAPARE - Hydrologist (Medical): No    Lack of Transportation (Non-Medical): No  Physical Activity: Sufficiently Active (05/19/2021)   Exercise Vital Sign    Days of Exercise per Week: 3 days  Minutes of Exercise per Session: 60 min  Stress: No Stress Concern Present (05/19/2021)   [place]    Feeling of Stress : Only a little  Social Connections: Moderately Isolated (05/19/2021)   Social Connection and Isolation Panel [NHANES]    Frequency of Communication with Friends and Family: Once a week    Frequency of Social Gatherings with Friends and Family: Once a week    Attends Religious Services: More than 4 times per year    Active Member of [employer] or Organizations: Yes    Attends Music therapist: More than 4 times per year    Marital Status: Divorced   Family History  Problem Relation Age of Onset   Heart disease Mother 15   COPD Mother    Crohn's disease Mother    Heart disease Father 69   Cancer Father        kidney   Colon cancer Neg Hx    Esophageal cancer Neg Hx    Stomach cancer Neg Hx    Rectal cancer Neg Hx     Allergies  Allergen Reactions   Beeswax     Per skin test from dermatologist   Cetrimonium Chloride [Cetrimide]     Per skin test from dermatologist   Methylisothiazolinone     Per skin test from dermatologist   Neomycin Sulfate [Neomycin]     Per skin test from dermatologist   Nickel Sulfate [Nickel]     Per skin test from dermatologist   Penicillins     Childhood Reaction    Propolis     Per skin test from dermatologist   Current Outpatient Medications  Medication Sig Dispense Refill   amLODipine (NORVASC) 2.5 MG tablet Take 1 tablet (2.5 mg total) by mouth daily. 30 tablet 5   azithromycin (ZITHROMAX Z-PAK) 250 MG tablet As directed 6 each 0   Biotin w/ Vitamins C & E (HAIR SKIN & NAILS GUMMIES PO) Take 2 each by mouth daily.     Calcium Carb-Cholecalciferol (CALCIUM 600+D3 PO) Take 1 tablet by mouth daily.     Cholecalciferol 25 MCG (1000 UT) CHEW Chew 1,000 Units by mouth daily.     cyanocobalamin (VITAMIN B12) 1000 MCG tablet Take 1,000 mcg by mouth daily.     Ginkgo Biloba 60 MG CAPS Take 60 mg by mouth daily.     lisinopril-hydrochlorothiazide (ZESTORETIC) 20-12.5 MG tablet TAKE ONE TABLET BY MOUTH ONE TIME DAILY 90 tablet 0   loratadine (CLARITIN) 10 MG tablet Take 10 mg by mouth daily.     Misc Natural Products (ELDERBERRY IMMUNE COMPLEX PO) Take 1 each by mouth daily.     Multiple Vitamin (MULTI-VITAMIN) tablet Take 1 tablet by mouth daily.     omeprazole (PRILOSEC) 40 MG capsule Take 1 capsule (40 mg total) by mouth daily. 90 capsule 3   Probiotic Product (PROBIOTIC DAILY PO) Take 1 capsule by mouth daily.     triamcinolone cream (KENALOG) 0.1 % Apply 1 Application topically 2 (two) times daily as needed. 30 g 0   vitamin C (ASCORBIC ACID) 500 MG tablet Take 500 mg by mouth daily.     VYZULTA 0.024 % SOLN Place 1 drop into both eyes at bedtime.     No current facility-administered medications for this visit.   No results found.  Review of Systems:   A ROS was  performed including pertinent positives and negatives as documented in the HPI.  Musculoskeletal Exam:    There were no vitals taken for this visit.  Left shoulder incisions are healed.  In the sitting position she is able to forward elevate to 160 degrees on the left compared to 165 on the right.  External rotation at the side is to 60 degrees bilaterally.  Internal rotation is to T12 bilaterally.  Strength is nearly equal to the contralateral side and forward elevation.  2+ radial pulse  Imaging:      I personally reviewed and interpreted the radiographs.   Assessment:   6 months weeks status post left shoulder rotator cuff repair and biceps tenodesis overall doing very well.  At this time I will plan to see her back as needed.  She will continue to work on strengthening on her own and endurance. Plan :    -She will return to clinic as needed        I personally saw and evaluated the patient, and participated in the management and treatment plan.  Vanetta Mulders, MD Attending Physician, Orthopedic Surgery  This document was dictated using Dragon voice recognition software. A reasonable attempt at proof reading has been made to minimize errors.

## 2022-04-29 NOTE — Therapy (Signed)
OUTPATIENT PHYSICAL THERAPY SHOULDER Discharge     Patient Name: Heidi Shah MRN: [id number] DOB:February 02, 1946, 77 y.o., female Today's Date: 04/29/2022   PT End of Session - 04/29/22 1351     Visit Number 17    Number of Visits 25    Date for PT Re-Evaluation 05/01/21    Authorization Type [employer] MCR    PT Start Time 1345    PT Stop Time 1415    PT Time Calculation (min) 30 min    Activity Tolerance Patient tolerated treatment well    Behavior During Therapy WFL for tasks assessed/performed                        Past Medical History:  Diagnosis Date   Allergy    Anxiety    Arthritis    Cataract    surgery - Bilateral cataracts removed   Chicken pox    Colon polyps    Depression    Diverticulitis    Diverticulosis    Esophageal stricture    Family history of adverse reaction to anesthesia    difficult time waking mother up   GERD (gastroesophageal reflux disease)    Heart murmur    Hypertension    Past Surgical History:  Procedure Laterality Date   cataract surgery Bilateral 1996   COLONOSCOPY  2022   EYE SURGERY  2000   detached L retina   HERNIA REPAIR Bilateral 1990   inguinal   NECK SURGERY     as a child   POLYPECTOMY  2022   SHOULDER ARTHROSCOPY WITH ROTATOR CUFF REPAIR AND OPEN BICEPS TENODESIS Left 12/10/2021   Procedure: LEFT SHOULDER ARTHROSCOPY WITH ROTATOR CUFF REPAIR AND OPEN BICEPS TENODESIS;  Surgeon: Vanetta Mulders, MD;  Location: [facility];  Service: Orthopedics;  Laterality: Left;   UPPER GASTROINTESTINAL ENDOSCOPY  2022   Patient Active Problem List   Diagnosis Date Noted   Traumatic complete tear of left rotator cuff    Cervical spondylosis with myelopathy and radiculopathy 03/14/2020   Neck pain 03/14/2020   Aortic heart murmur 02/25/2020   Osteoporosis 04/04/2019   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   Unspecified constipation 03/05/2013   GERD (gastroesophageal reflux disease) 01/19/2013   Esophageal spasm 01/19/2013    Dysphagia, unspecified(787.20) 01/19/2013   Diverticulitis of colon (without mention of hemorrhage)(562.11) 11/23/2012   History of depression 11/23/2012   Essential hypertension, benign 11/23/2012    REFERRING PROVIDER: Vanetta Mulders, MD  REFERRING DIAG: Postop, left shoulder rotator cuff repair PROCEDURE: Arthroscopic limited debridement - 29822 Arthroscopic subacromial decompression - 44818 Arthroscopic rotator cuff repair - 56314 Arthroscopic biceps tenodesis - 97026  THERAPY DIAG:  Acute pain of left shoulder  Muscle weakness (generalized)  Rationale for Evaluation and Treatment Rehabilitation  ONSET DATE: DOS 12/10/21  Days since surgery: 140   SUBJECTIVE:  SUBJECTIVE STATEMENT:  Ready to d/c to program with personal trainer  PERTINENT HISTORY: H/o neck pain, OP  PAIN:  Are you having pain? No  PRECAUTIONS: none  WEIGHT BEARING RESTRICTIONS Yes shoulder  FALLS:  Has patient fallen in last 6 months? 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  Shoulder adduction 4+/5      Shoulder internal rotation 4+/5 4+/5 4+/5   Shoulder external rotation 4+/5 4/5 4+/5      TODAY'S TREATMENT:   Treatment                            04/29/22:  Verbal review of home exercise program   Treatment                            04/01/22:  Qped: alt UE flexion, bird dog, lawn mower Wall push ups Low trap set from wall Arm circles Triceps kicks   Treatment                            03/17/22:  UBE - Standing Shoulder Flexion Full Range  - 1 x daily - 7 x weekly - 3 sets - 5 reps - Standing Shoulder Scaption  - Standing Shoulder Abduction Full Range  - Wall Push Up  - 1 x daily - 7 x weekly - 3 sets - 10 reps - Bent Over Tricep Extension with Counter Support  - 1 x daily - 7 x weekly - 3 sets - 10 reps - Standing Shoulder External Rotation with Dumbbell  - 1 x daily - 7 x weekly - 3 sets - 10 reps - Standing Bicep Curls Supinated with Dumbbells  - 1 x daily - 7 x weekly - 3 sets - 10 reps - Standing Bicep Curls Neutral with Dumbbells  - 1 x daily - 7 x weekly - 3 sets - 10 reps - Side Plank with Arm Lift  - 1 x daily - 7 x weekly - 3 sets - 10 reps     PATIENT EDUCATION: Education details: goal review/progress/POC  Person educated: Patient Education method: Explanation, Demonstration, Tactile cues, Verbal cues, and Handouts Education comprehension: verbalized understanding, returned demonstration, verbal cues required, tactile cues required, and needs further education   HOME EXERCISE PROGRAM: [id number]  ASSESSMENT:  CLINICAL IMPRESSION: Days since surgery: 140  Patient has met all of her goals at this time and is prepared for discharge to independent exercise program.  She has 3 session set up with a personal  trainer at the [facility] to progress gym exercises.  Discussed the importance of continuing lumbopelvic strength and stability exercises for support of scoliosis and proper movement of glenohumeral joint.  Encouraged her to contact me with any further  questions.    OBJECTIVE IMPAIRMENTS decreased activity tolerance, decreased ROM, decreased strength, increased edema, increased muscle spasms, impaired flexibility, impaired UE functional use, improper body mechanics, postural dysfunction, and pain.   ACTIVITY LIMITATIONS carrying, lifting, sleeping, bed mobility, bathing, dressing, reach over head, hygiene/grooming, and caring for others  PARTICIPATION LIMITATIONS: meal prep, cleaning, laundry, driving, shopping, community activity, and occupation  PERSONAL FACTORS 1-2 comorbidities: h/o neck pain, OP  are also affecting patient's functional outcome.   REHAB POTENTIAL: Good  CLINICAL DECISION MAKING: Stable/uncomplicated  EVALUATION COMPLEXITY: Low   GOALS: Goals reviewed with patient? Yes  SHORT TERM GOALS: Target date: 01/08/22  PROM goals met for 4 weeks (140 FF, 40 ER, 60 ABD) Baseline: see obj Goal status: 12/13- MET   2.  Average pain <=4/10 Baseline: none at rest at eval Goal status: achieved    LONG TERM GOALS: Target date: 03/12/22  Will tolerate beginning light resistance exercises to shoulder Baseline: unable at eval Goal status: achieved Target date: 02/04/22 (8 weeks)  2.  Able to be out of her sling during the day, pain <=4/10 Baseline: unable at eval Goal status: achieved Target date: 02/04/22 (8 weeks)  3.  Able to dress and fix hair independently and without pain Baseline: unable at eval Goal status: met Target date: 03/04/22 (12 weeks)  4.  AROM within 5 deg of opp UE Baseline: unable at eval Goal status: PARTIALLY MET  Target date: 12/13- met in flexion, still tight in abd but all AROM functional  5.   MMT to be 5/5 in all tested groups in surgical shoulder  Baseline:  Goal status: achieved      PLAN: PT FREQUENCY: 1x/week  PT DURATION: 6 weeks  PLANNED INTERVENTIONS: Therapeutic exercises, Therapeutic activity, Neuromuscular re-education, Patient/Family education, Self Care, Joint  mobilization, Aquatic Therapy, Dry Needling, Spinal mobilization, Cryotherapy, Moist heat, Taping, Manual therapy, and Re-evaluation   Lenzie Montesano C. Isabellarose Kope PT, DPT 04/29/22 2:29 PM   Referring diagnosis? R60.454U (ICD-10-CM) - Traumatic complete tear of left rotator cuff, initial encounter Treatment diagnosis? (if different than referring diagnosis) M25.512 M25.612, M62.81 What was this (referring dx) caused by? '[x]'$  Surgery '[]'$  Fall '[]'$  Ongoing issue '[]'$  Arthritis '[]'$  Other: ____________   Laterality: '[]'$  Rt '[x]'$  Lt '[]'$  Both   Check all possible CPT codes:             *CHOOSE 10 OR LESS*                          '[]'$  97110 (Therapeutic Exercise)             '[]'$  92507 (SLP Treatment)  '[]'$  97112 (Neuro Re-ed)                           '[]'$  92526 (Swallowing Treatment)             '[]'$  97116 (Gait Training)                           '[]'$  [id number] (Cognitive Training, 1st 15 minutes) '[]'$  97140 (Manual Therapy)                                '[]'$   68088 (Cognitive Training, each add'l 15 minutes)   '[]'$  97164 (Re-evaluation)                              '[]'$  Other, List CPT Code ____________  '[]'$  11031 (Therapeutic Activities)                                    '[]'$  97535 (Self Care)                      '[x]'$  All codes above (97110 - 97535)            '[]'$  97012 (Mechanical Traction)            '[x]'$  97014 (E-stim Unattended)            '[]'$  97032 (E-stim manual)            '[]'$  97033 (Ionto)            '[]'$  97035 (Ultrasound) '[]'$  97750 (Physical Performance Training) '[x]'$  [id number] (Aquatic Therapy) '[]'$  97016 (Vasopneumatic Device) '[]'$  59458 (Paraffin) '[]'$  97034 (Contrast Bath) '[]'$  97597 (Wound Care 1st 20 sq cm) '[]'$  97598 (Wound Care each add'l 20 sq cm) '[]'$  97760 (Orthotic Fabrication, Fitting, Training Initial) '[]'$  [id number] (Prosthetic Management and Training Initial) '[]'$  [phone] (Orthotic or Prosthetic Training/ Modification Subsequent)   PHYSICAL THERAPY DISCHARGE SUMMARY  Visits from Start of Care: 17  Current functional  level related to goals / functional outcomes: See above   Remaining deficits: See above   Education / Equipment: Anatomy of condition, POC, HEP, exercise form/rationale    Patient agrees to discharge. Patient goals were met. Patient is being discharged due to meeting the stated rehab goals.Marland Kitchenc

## 2022-04-30 ENCOUNTER — Other Ambulatory Visit: Payer: Self-pay | Admitting: Family Medicine

## 2022-04-30 DIAGNOSIS — Z1231 Encounter for screening mammogram for malignant neoplasm of breast: Secondary | ICD-10-CM

## 2022-05-03 DIAGNOSIS — H401131 Primary open-angle glaucoma, bilateral, mild stage: Secondary | ICD-10-CM | POA: Diagnosis not present

## 2022-05-04 ENCOUNTER — Other Ambulatory Visit: Payer: Medicare HMO

## 2022-05-07 ENCOUNTER — Encounter: Payer: Self-pay | Admitting: Family Medicine

## 2022-05-07 ENCOUNTER — Ambulatory Visit (INDEPENDENT_AMBULATORY_CARE_PROVIDER_SITE_OTHER): Payer: Medicare HMO | Admitting: Family Medicine

## 2022-05-07 VITALS — BP 128/80 | HR 91 | Temp 97.8°F | Ht 59.0 in | Wt 111.7 lb

## 2022-05-07 DIAGNOSIS — I10 Essential (primary) hypertension: Secondary | ICD-10-CM

## 2022-05-07 MED ORDER — LORAZEPAM 0.5 MG PO TABS: ORAL_TABLET | ORAL | 0 refills | Status: DC

## 2022-05-07 MED ORDER — AMLODIPINE BESYLATE 2.5 MG PO TABS: 2.5000 mg | ORAL_TABLET | Freq: Every day | ORAL | 3 refills | Status: DC

## 2022-05-07 MED ORDER — LISINOPRIL-HYDROCHLOROTHIAZIDE 20-12.5 MG PO TABS: 1.0000 | ORAL_TABLET | Freq: Every day | ORAL | 3 refills | Status: DC

## 2022-05-07 NOTE — Addendum Note (Signed)
Addended by: Nilda Riggs on: 05/07/2022 08:40 AM   Modules accepted: Orders

## 2022-05-07 NOTE — Progress Notes (Signed)
Established Patient Office Visit  Subjective   Patient ID: Heidi Shah, female    DOB: 31-Jan-1946  Age: 77 y.o. MRN: [id number]  No chief complaint on file.   HPI   Heidi Shah is here for follow-up hypertension.  We recently added low-dose amlodipine 2.5 mg daily to her lisinopril HCTZ.  She was here for physical in December and had had several systolic readings 0000000.  She has done well with the amlodipine.  No side effects.  We referred her to ENT for some persistent laryngitis symptoms following shoulder surgery.  She had evidence for vocal cord paralysis.  She had CT neck and chest which were unremarkable.  MRI brain pending.  Felt she may have had some injury from her intubation but not sure.  She is concerned about anxiety with MRI scan and requesting premedication for that.  Past Medical History:  Diagnosis Date   Allergy    Anxiety    Arthritis    Cataract    surgery - Bilateral cataracts removed   Chicken pox    Colon polyps    Depression    Diverticulitis    Diverticulosis    Esophageal stricture    Family history of adverse reaction to anesthesia    difficult time waking mother up   GERD (gastroesophageal reflux disease)    Heart murmur    Hypertension    Past Surgical History:  Procedure Laterality Date   cataract surgery Bilateral 1996   COLONOSCOPY  2022   EYE SURGERY  2000   detached L retina   HERNIA REPAIR Bilateral 1990   inguinal   NECK SURGERY     as a child   POLYPECTOMY  2022   SHOULDER ARTHROSCOPY WITH ROTATOR CUFF REPAIR AND OPEN BICEPS TENODESIS Left 12/10/2021   Procedure: LEFT SHOULDER ARTHROSCOPY WITH ROTATOR CUFF REPAIR AND OPEN BICEPS TENODESIS;  Surgeon: Vanetta Mulders, MD;  Location: [facility];  Service: Orthopedics;  Laterality: Left;   UPPER GASTROINTESTINAL ENDOSCOPY  2022    reports that she has never smoked. She has never used smokeless tobacco. She reports current alcohol use of about 1.0 standard drink of alcohol per week. She  reports that she does not use drugs. family history includes COPD in her mother; Cancer in her father; Crohn's disease in her mother; Heart disease (age of onset: 19) in her mother; Heart disease (age of onset: 31) in her father. Allergies  Allergen Reactions   Beeswax     Per skin test from dermatologist   Cetrimonium Chloride [Cetrimide]     Per skin test from dermatologist   Methylisothiazolinone     Per skin test from dermatologist   Neomycin Sulfate [Neomycin]     Per skin test from dermatologist   Nickel Sulfate [Nickel]     Per skin test from dermatologist   Penicillins     Childhood Reaction    Propolis     Per skin test from dermatologist    Review of Systems  Constitutional:  Negative for malaise/fatigue.  Eyes:  Negative for blurred vision.  Respiratory:  Negative for shortness of breath.   Cardiovascular:  Negative for chest pain.  Neurological:  Negative for dizziness, weakness and headaches.      Objective:     BP 128/80 (BP Location: Left Arm, Cuff Size: Normal)   Pulse 91   Temp 97.8 F (36.6 C) (Oral)   Ht 4' 11"$  (1.499 m)   Wt 111 lb 11.2 oz (50.7 kg)  SpO2 98%   BMI 22.56 kg/m    Physical Exam Vitals reviewed.  Constitutional:      Appearance: She is well-developed.  Eyes:     Pupils: Pupils are equal, round, and reactive to light.  Neck:     Thyroid: No thyromegaly.     Vascular: No JVD.  Cardiovascular:     Rate and Rhythm: Normal rate and regular rhythm.     Heart sounds:     No gallop.  Pulmonary:     Effort: Pulmonary effort is normal. No respiratory distress.     Breath sounds: Normal breath sounds. No wheezing or rales.  Musculoskeletal:     Cervical back: Neck supple.  Neurological:     Mental Status: She is alert.      No results found for any visits on 05/07/22.    The 10-year ASCVD risk score (Arnett DK, et al., 2019) is: 19.4%    Assessment & Plan:   Problem List Items Addressed This Visit        Unprioritized   Essential hypertension, benign - Primary   Relevant Medications   amLODipine (NORVASC) 2.5 MG tablet   lisinopril-hydrochlorothiazide (ZESTORETIC) 20-12.5 MG tablet  Hypertension improved following addition of low-dose amlodipine.  Refilled amlodipine and lisinopril HCTZ for 1 year -We sent in limited lorazepam 0.5 mg take 1 to 2 tablets 1 hour prior to MRI procedure -She will follow-up for physical in December and sooner as needed  Return in about 6 months (around 11/05/2022).    Carolann Littler, MD

## 2022-05-25 ENCOUNTER — Ambulatory Visit
Admission: RE | Admit: 2022-05-25 | Discharge: 2022-05-25 | Disposition: A | Discharge status: Home / Self Care | Payer: Medicare HMO | Source: Ambulatory Visit | Attending: Otolaryngology | Admitting: Otolaryngology

## 2022-05-25 DIAGNOSIS — J3800 Paralysis of vocal cords and larynx, unspecified: Secondary | ICD-10-CM

## 2022-05-25 DIAGNOSIS — I6782 Cerebral ischemia: Secondary | ICD-10-CM | POA: Diagnosis not present

## 2022-05-25 MED ORDER — GADOPICLENOL 0.5 MMOL/ML IV SOLN
5.0000 mL | Freq: Once | INTRAVENOUS | Status: AC | PRN
  Administered 2022-05-25: 5 mL via INTRAVENOUS

## 2022-05-28 ENCOUNTER — Ambulatory Visit (INDEPENDENT_AMBULATORY_CARE_PROVIDER_SITE_OTHER): Payer: Medicare HMO

## 2022-05-28 VITALS — BP 118/64 | HR 90 | Temp 98.1°F | Ht 59.0 in | Wt 114.3 lb

## 2022-05-28 DIAGNOSIS — Z0000 Encounter for general adult medical examination without abnormal findings: Secondary | ICD-10-CM

## 2022-05-28 NOTE — Patient Instructions (Addendum)
Heidi Shah , Thank you for taking time to come for your Medicare Wellness Visit. I appreciate your ongoing commitment to your health goals. Please review the following plan we discussed and let me know if I can assist you in the future.   These are the goals we discussed:  Goals       avoid covid      Exercise 3x per week (30 min per time) (pt-stated)      Maintain exercise program.      Patient Stated      Eat calcium rich foods for osteoporosis treatment since you are unable to tolerate calcium supplements      Patient stated (pt-stated)      My goal is to find a hobby.        This is a list of the screening recommended for you and due dates:  Health Maintenance  Topic Date Due   COVID-19 Vaccine (4 - 2023-24 season) 06/13/2022*   Hepatitis C Screening: USPSTF Recommendation to screen - Ages 18-79 yo.  02/12/2026*   Pneumonia Vaccine (3 of 3 - PPSV23 or PCV20) 03/10/2039*   Mammogram  05/01/2023   Medicare Annual Wellness Visit  05/28/2023   Colon Cancer Screening  06/16/2025   DTaP/Tdap/Td vaccine (3 - Td or Tdap) 02/25/2026   Flu Shot  Completed   DEXA scan (bone density measurement)  Completed   Zoster (Shingles) Vaccine  Completed   HPV Vaccine  Aged Out  *Topic was postponed. The date shown is not the original due date.    Advanced directives: Please bring a copy of your health care power of attorney and living will to the office to be added to your chart at your convenience.   Conditions/risks identified: None  Next appointment: Follow up in one year for your annual wellness visit     Preventive Care 65 Years and Older, Female Preventive care refers to lifestyle choices and visits with your health care provider that can promote health and wellness. What does preventive care include? A yearly physical exam. This is also called an annual well check. Dental exams once or twice a year. Routine eye exams. Ask your health care provider how often you should have  your eyes checked. Personal lifestyle choices, including: Daily care of your teeth and gums. Regular physical activity. Eating a healthy diet. Avoiding tobacco and drug use. Limiting alcohol use. Practicing safe sex. Taking low-dose aspirin every day. Taking vitamin and mineral supplements as recommended by your health care provider. What happens during an annual well check? The services and screenings done by your health care provider during your annual well check will depend on your age, overall health, lifestyle risk factors, and family history of disease. Counseling  Your health care provider may ask you questions about your: Alcohol use. Tobacco use. Drug use. Emotional well-being. Home and relationship well-being. Sexual activity. Eating habits. History of falls. Memory and ability to understand (cognition). Work and work Statistician. Reproductive health. Screening  You may have the following tests or measurements: Height, weight, and BMI. Blood pressure. Lipid and cholesterol levels. These may be checked every 5 years, or more frequently if you are over 55 years old. Skin check. Lung cancer screening. You may have this screening every year starting at age 77 if you have a 30-pack-year history of smoking and currently smoke or have quit within the past 15 years. Fecal occult blood test (FOBT) of the stool. You may have this test every year starting at  age 29. Flexible sigmoidoscopy or colonoscopy. You may have a sigmoidoscopy every 5 years or a colonoscopy every 10 years starting at age 32. Hepatitis C blood test. Hepatitis B blood test. Sexually transmitted disease (STD) testing. Diabetes screening. This is done by checking your blood sugar (glucose) after you have not eaten for a while (fasting). You may have this done every 1-3 years. Bone density scan. This is done to screen for osteoporosis. You may have this done starting at age 65. Mammogram. This may be done every  1-2 years. Talk to your health care provider about how often you should have regular mammograms. Talk with your health care provider about your test results, treatment options, and if necessary, the need for more tests. Vaccines  Your health care provider may recommend certain vaccines, such as: Influenza vaccine. This is recommended every year. Tetanus, diphtheria, and acellular pertussis (Tdap, Td) vaccine. You may need a Td booster every 10 years. Zoster vaccine. You may need this after age 89. Pneumococcal 13-valent conjugate (PCV13) vaccine. One dose is recommended after age 63. Pneumococcal polysaccharide (PPSV23) vaccine. One dose is recommended after age 72. Talk to your health care provider about which screenings and vaccines you need and how often you need them. This information is not intended to replace advice given to you by your health care provider. Make sure you discuss any questions you have with your health care provider. Document Released: 04/11/2015 Document Revised: 12/03/2015 Document Reviewed: 01/14/2015 Elsevier Interactive Patient Education  2017 [place] Prevention in the Home Falls can cause injuries. They can happen to people of all ages. There are many things you can do to make your home safe and to help prevent falls. What can I do on the outside of my home? Regularly fix the edges of walkways and driveways and fix any cracks. Remove anything that might make you trip as you walk through a door, such as a raised step or threshold. Trim any bushes or trees on the path to your home. Use bright outdoor lighting. Clear any walking paths of anything that might make someone trip, such as rocks or tools. Regularly check to see if handrails are loose or broken. Make sure that both sides of any steps have handrails. Any raised decks and porches should have guardrails on the edges. Have any leaves, snow, or ice cleared regularly. Use sand or salt on walking  paths during winter. Clean up any spills in your garage right away. This includes oil or grease spills. What can I do in the bathroom? Use night lights. Install grab bars by the toilet and in the tub and shower. Do not use towel bars as grab bars. Use non-skid mats or decals in the tub or shower. If you need to sit down in the shower, use a plastic, non-slip stool. Keep the floor dry. Clean up any water that spills on the floor as soon as it happens. Remove soap buildup in the tub or shower regularly. Attach bath mats securely with double-sided non-slip rug tape. Do not have throw rugs and other things on the floor that can make you trip. What can I do in the bedroom? Use night lights. Make sure that you have a light by your bed that is easy to reach. Do not use any sheets or blankets that are too big for your bed. They should not hang down onto the floor. Have a firm chair that has side arms. You can use this for support while  you get dressed. Do not have throw rugs and other things on the floor that can make you trip. What can I do in the kitchen? Clean up any spills right away. Avoid walking on wet floors. Keep items that you use a lot in easy-to-reach places. If you need to reach something above you, use a strong step stool that has a grab bar. Keep electrical cords out of the way. Do not use floor polish or wax that makes floors slippery. If you must use wax, use non-skid floor wax. Do not have throw rugs and other things on the floor that can make you trip. What can I do with my stairs? Do not leave any items on the stairs. Make sure that there are handrails on both sides of the stairs and use them. Fix handrails that are broken or loose. Make sure that handrails are as long as the stairways. Check any carpeting to make sure that it is firmly attached to the stairs. Fix any carpet that is loose or worn. Avoid having throw rugs at the top or bottom of the stairs. If you do have  throw rugs, attach them to the floor with carpet tape. Make sure that you have a light switch at the top of the stairs and the bottom of the stairs. If you do not have them, ask someone to add them for you. What else can I do to help prevent falls? Wear shoes that: Do not have high heels. Have rubber bottoms. Are comfortable and fit you well. Are closed at the toe. Do not wear sandals. If you use a stepladder: Make sure that it is fully opened. Do not climb a closed stepladder. Make sure that both sides of the stepladder are locked into place. Ask someone to hold it for you, if possible. Clearly mark and make sure that you can see: Any grab bars or handrails. First and last steps. Where the edge of each step is. Use tools that help you move around (mobility aids) if they are needed. These include: Canes. Walkers. Scooters. Crutches. Turn on the lights when you go into a dark area. Replace any light bulbs as soon as they burn out. Set up your furniture so you have a clear path. Avoid moving your furniture around. If any of your floors are uneven, fix them. If there are any pets around you, be aware of where they are. Review your medicines with your doctor. Some medicines can make you feel dizzy. This can increase your chance of falling. Ask your doctor what other things that you can do to help prevent falls. This information is not intended to replace advice given to you by your health care provider. Make sure you discuss any questions you have with your health care provider. Document Released: 01/09/2009 Document Revised: 08/21/2015 Document Reviewed: 04/19/2014 Elsevier Interactive Patient Education  2017 [employer].

## 2022-05-28 NOTE — Progress Notes (Signed)
Subjective:   Heidi Shah is a 77 y.o. female who presents for Medicare Annual (Subsequent) preventive examination.  Review of Systems      Cardiac Risk Factors include: advanced age (>62mn, >>83women);hypertension     Objective:    Today's Vitals   05/28/22 1406  BP: 118/64  Pulse: 90  Temp: 98.1 F (36.7 C)  TempSrc: Oral  SpO2: 98%  Weight: 114 lb 4.8 oz (51.8 kg)  Height: '4\' 11"'$  (1.499 m)   Body mass index is 23.09 kg/m.     05/28/2022    2:23 PM 12/15/2021    8:47 AM 12/04/2021    8:14 AM 08/03/2021    8:52 AM 05/07/2021    9:12 AM 04/24/2020   11:48 AM 04/24/2019   11:41 AM  Advanced Directives  Does Patient Have a Medical Advance Directive? Yes No No No No Yes No  Type of AParamedicof ATsaileLiving will     [place] will   Does patient want to make changes to medical advance directive?      No - Patient declined   Copy of [place] Chart? No - copy requested        Would patient like information on creating a medical advance directive?  No - Patient declined No - Patient declined  No - Patient declined  Yes (MAU/Ambulatory/Procedural Areas - Information given)    Current Medications (verified) Outpatient Encounter Medications as of 05/28/2022  Medication Sig   amLODipine (NORVASC) 2.5 MG tablet Take 1 tablet (2.5 mg total) by mouth daily.   Biotin w/ Vitamins C & E (HAIR SKIN & NAILS GUMMIES PO) Take 2 each by mouth daily.   Calcium Carb-Cholecalciferol (CALCIUM 600+D3 PO) Take 1 tablet by mouth daily.   Cholecalciferol 25 MCG (1000 UT) CHEW Chew 1,000 Units by mouth daily.   cyanocobalamin (VITAMIN B12) 1000 MCG tablet Take 1,000 mcg by mouth daily.   Ginkgo Biloba 60 MG CAPS Take 60 mg by mouth daily.   lisinopril-hydrochlorothiazide (ZESTORETIC) 20-12.5 MG tablet Take 1 tablet by mouth daily.   LORazepam (ATIVAN) 0.5 MG tablet Take one tablet by mouth about one hour prior to  procedure.   Misc Natural Products (ELDERBERRY IMMUNE COMPLEX PO) Take 1 each by mouth daily.   Multiple Vitamin (MULTI-VITAMIN) tablet Take 1 tablet by mouth daily.   omeprazole (PRILOSEC) 40 MG capsule Take 1 capsule (40 mg total) by mouth daily.   Probiotic Product (PROBIOTIC DAILY PO) Take 1 capsule by mouth daily.   triamcinolone cream (KENALOG) 0.1 % Apply 1 Application topically 2 (two) times daily as needed.   vitamin C (ASCORBIC ACID) 500 MG tablet Take 500 mg by mouth daily.   VYZULTA 0.024 % SOLN Place 1 drop into both eyes at bedtime.   No facility-administered encounter medications on file as of 05/28/2022.    Allergies (verified) Beeswax, Cetrimonium chloride [cetrimide], Methylisothiazolinone, Neomycin sulfate [neomycin], Nickel sulfate [nickel], Penicillins, and Propolis   History: Past Medical History:  Diagnosis Date   Allergy    Anxiety    Arthritis    Cataract    surgery - Bilateral cataracts removed   Chicken pox    Colon polyps    Depression    Diverticulitis    Diverticulosis    Esophageal stricture    Family history of adverse reaction to anesthesia    difficult time waking mother up   GERD (gastroesophageal reflux disease)  Heart murmur    Hypertension    Past Surgical History:  Procedure Laterality Date   cataract surgery Bilateral 1996   COLONOSCOPY  2022   EYE SURGERY  2000   detached L retina   HERNIA REPAIR Bilateral 1990   inguinal   NECK SURGERY     as a child   POLYPECTOMY  2022   SHOULDER ARTHROSCOPY WITH ROTATOR CUFF REPAIR AND OPEN BICEPS TENODESIS Left 12/10/2021   Procedure: LEFT SHOULDER ARTHROSCOPY WITH ROTATOR CUFF REPAIR AND OPEN BICEPS TENODESIS;  Surgeon: Vanetta Mulders, MD;  Location: [facility];  Service: Orthopedics;  Laterality: Left;   UPPER GASTROINTESTINAL ENDOSCOPY  2022   Family History  Problem Relation Age of Onset   Heart disease Mother 67   COPD Mother    Crohn's disease Mother    Heart disease Father 3    Cancer Father        kidney   Colon cancer Neg Hx    Esophageal cancer Neg Hx    Stomach cancer Neg Hx    Rectal cancer Neg Hx    Social History   Socioeconomic History   Marital status: Divorced    Spouse name: Not on file   Number of children: 2   Years of education: master's degree   Highest education level: Master's degree (e.g., MA, MS, MEng, MEd, MSW, MBA)  Occupational History   Occupation: retired Pharmacist, hospital    Comment: part - time Pharmacist, hospital  Tobacco Use   Smoking status: Never   Smokeless tobacco: Never  Vaping Use   Vaping Use: Never used  Substance and Sexual Activity   Alcohol use: Yes    Alcohol/week: 1.0 standard drink of alcohol    Types: 1 Glasses of wine per week    Comment: one or two glasses of wine a couple times a week   Drug use: No   Sexual activity: Not on file  Other Topics Concern   Not on file  Social History Narrative      Lives alone in ranch home.    2 children   Son lives in [place]   Continues to teach virtually part time   Social Determinants of Health   Financial Resource Strain: Low Risk  (05/28/2022)   Overall Financial Resource Strain (CARDIA)    Difficulty of Paying Living Expenses: Not hard at all  Food Insecurity: No Food Insecurity (05/28/2022)   Hunger Vital Sign    Worried About Running Out of Food in the Last Year: Never true    Ran Out of Food in the Last Year: Never true  Transportation Needs: No Transportation Needs (05/28/2022)   PRAPARE - Hydrologist (Medical): No    Lack of Transportation (Non-Medical): No  Physical Activity: Insufficiently Active (05/28/2022)   Exercise Vital Sign    Days of Exercise per Week: 2 days    Minutes of Exercise per Session: 60 min  Stress: No Stress Concern Present (05/28/2022)   [place]    Feeling of Stress : Not at all  Social Connections: Moderately Integrated (05/28/2022)   Social Connection  and Isolation Panel [NHANES]    Frequency of Communication with Friends and Family: More than three times a week    Frequency of Social Gatherings with Friends and Family: More than three times a week    Attends Religious Services: More than 4 times per year    Active Member of [employer]  or Organizations: Yes    Attends Music therapist: More than 4 times per year    Marital Status: Divorced    Tobacco Counseling Counseling given: No   Clinical Intake:  Pre-visit preparation completed: No  Pain : No/denies pain     BMI - recorded: 23.09 Nutritional Status: BMI of 19-24  Normal Nutritional Risks: None Diabetes: No  How often do you need to have someone help you when you read instructions, pamphlets, or other written materials from your doctor or pharmacy?: 1 - Never  Diabetic?  No  Interpreter Needed?: No  Information entered by :: Rolene Arbour LPN   Activities of Daily Living    05/28/2022    2:21 PM 12/04/2021    8:18 AM  In your present state of health, do you have any difficulty performing the following activities:  Hearing? 0   Vision? 0   Difficulty concentrating or making decisions? 0   Walking or climbing stairs? 0   Dressing or bathing? 0   Doing errands, shopping? 0 0  Preparing Food and eating ? N   Using the Toilet? N   In the past six months, have you accidently leaked urine? N   Do you have problems with loss of bowel control? N   Managing your Medications? N   Managing your Finances? N   Housekeeping or managing your Housekeeping? N     Patient Care Team: Eulas Post, MD as PCP - General (Family Medicine) Kevan Rosebush, MD as Referring Physician (Dermatology) [place], Heather, [place] (Optometry) Irene Shipper, MD as Consulting Physician (Gastroenterology)  Indicate any recent Medical Services you may have received from other than [facility] providers in the past year (date may be approximate).     Assessment:   This is a routine  wellness examination for Heidi Shah.  Hearing/Vision screen Hearing Screening - Comments:: Denies hearing difficulties   Vision Screening - Comments:: Wears rx glasses - up to date with routine eye exams with  Dr Augusto Gamble  Dietary issues and exercise activities discussed: Exercise limited by: None identified   Goals Addressed               This Visit's Progress     Patient stated (pt-stated)        My goal is to find a hobby.       Depression Screen    05/28/2022    2:20 PM 01/06/2022    3:51 PM 11/23/2021   11:04 AM 06/25/2021    1:08 PM 05/07/2021    9:07 AM 02/27/2021    8:15 AM 04/24/2020   11:50 AM  PHQ 2/9 Scores  PHQ - 2 Score 0 1 0 0 0 1 0  PHQ- 9 Score  6 1 0       Fall Risk    05/28/2022    2:22 PM 01/06/2022    3:52 PM 11/23/2021   11:04 AM 06/25/2021    1:07 PM 05/19/2021    9:41 AM  [place] in the past year? 0 1 0 1 0  Number falls in past yr: 0 0 0 1   Injury with Fall? 0 1 0 0   Risk for fall due to : No Fall Risks No Fall Risks History of fall(s) Other (Comment)   Follow up Falls prevention discussed Falls evaluation completed Falls evaluation completed Falls evaluation completed     Loren[place]:  Any stairs in or  around the home? Yes  If so, are there any without handrails? No  Home free of loose throw rugs in walkways, pet beds, electrical cords, etc? Yes  Adequate lighting in your home to reduce risk of falls? Yes   ASSISTIVE DEVICES UTILIZED TO PREVENT FALLS:  Life alert? No  Use of a cane, walker or w/c? No  Grab bars in the bathroom? No  Shower chair or bench in shower? No  Elevated toilet seat or a handicapped toilet? No   TIMED UP AND GO:  Was the test performed? Yes .  Length of time to ambulate 10 feet: 10 sec.   Gait steady and fast without use of assistive device  Cognitive Function:        05/28/2022    2:23 PM 05/07/2021    9:13 AM 04/24/2019   11:46 AM  6CIT Screen  What Year? 0 points 0  points 0 points  What month? 0 points 0 points 0 points  What time? 0 points 0 points 0 points  Count back from 20 0 points 0 points 0 points  Months in reverse 0 points 0 points 0 points  Repeat phrase 0 points 0 points 0 points  Total Score 0 points 0 points 0 points    Immunizations Immunization History  Administered Date(s) Administered   Fluad Quad(high Dose 65+) 01/05/2019, 02/25/2020, 03/01/2022   Influenza, High Dose Seasonal PF 02/03/2015, 02/13/2016, 01/09/2017, 01/02/2018   Influenza,inj,Quad PF,6+ Mos 12/27/2012, 12/26/2013, 02/27/2021   Influenza-Unspecified 01/09/2017, 01/05/2019   PFIZER(Purple Top)SARS-COV-2 Vaccination 05/19/2019, 06/19/2019, 03/25/2020   Pneumococcal Conjugate-13 01/01/2014   Pneumococcal Polysaccharide-23 12/27/2012   Td 08/27/2005   Tdap 02/26/2016   Zoster Recombinat (Shingrix) 02/20/2019, 10/01/2019    TDAP status: Up to date  Flu Vaccine status: Up to date    Covid-19 vaccine status: Completed vaccines  Qualifies for Shingles Vaccine? Yes   Zostavax completed Yes   Shingrix Completed?: Yes  Screening Tests Health Maintenance  Topic Date Due   COVID-19 Vaccine (4 - 2023-24 season) 06/13/2022 (Originally 11/27/2021)   Hepatitis C Screening  02/12/2026 (Originally 02/06/1964)   Pneumonia Vaccine 22+ Years old (36 of 3 - PPSV23 or PCV20) 03/10/2039 (Originally 01/02/2019)   MAMMOGRAM  05/01/2023   Medicare Annual Wellness (AWV)  05/28/2023   COLONOSCOPY (Pts 45-20yr Insurance coverage will need to be confirmed)  06/16/2025   DTaP/Tdap/Td (3 - Td or Tdap) 02/25/2026   INFLUENZA VACCINE  Completed   DEXA SCAN  Completed   Zoster Vaccines- Shingrix  Completed   HPV VACCINES  Aged Out    Health Maintenance  There are no preventive care reminders to display for this patient.   Colorectal cancer screening: Type of screening: Colonoscopy. Completed 06/16/20. Repeat every 5 years  Mammogram status: Ordered Scheduled for 06/21/22. Pt  provided with contact info and advised to call to schedule appt.   Bone Density status: Completed 04/03/19. Results reflect: Bone density results: OSTEOPOROSIS. Repeat every   years.  Lung Cancer Screening: (Low Dose CT Chest recommended if Age 75-80years, 30 pack-year currently smoking OR have quit w/in 15years.) does not qualify.     Additional Screening:  Hepatitis C Screening: does qualify; Deferred  Vision Screening: Recommended annual ophthalmology exams for early detection of glaucoma and other disorders of the eye. Is the patient up to date with their annual eye exam?  Yes  Who is the provider or what is the name of the office in which the patient attends annual eye exams? Dr  Omen If pt is not established with a provider, would they like to be referred to a provider to establish care? No . Dr Augusto Gamble  Dental Screening: Recommended annual dental exams for proper oral hygiene  Community Resource Referral / Chronic Care Management:  CRR required this visit?  No   CCM required this visit?  No      Plan:     I have personally reviewed and noted the following in the patient's chart:   Medical and social history Use of alcohol, tobacco or illicit drugs  Current medications and supplements including opioid prescriptions. Patient is not currently taking opioid prescriptions. Functional ability and status Nutritional status Physical activity Advanced directives List of other physicians Hospitalizations, surgeries, and ER visits in previous 12 months Vitals Screenings to include cognitive, depression, and falls Referrals and appointments  In addition, I have reviewed and discussed with patient certain preventive protocols, quality metrics, and best practice recommendations. A written personalized care plan for preventive services as well as general preventive health recommendations were provided to patient.     Criselda Peaches, LPN   [id number]   Nurse Notes:  None

## 2022-05-30 ENCOUNTER — Other Ambulatory Visit: Payer: Self-pay | Admitting: Family Medicine

## 2022-06-04 ENCOUNTER — Encounter: Payer: Self-pay | Admitting: Internal Medicine

## 2022-06-04 ENCOUNTER — Ambulatory Visit (AMBULATORY_SURGERY_CENTER): Payer: Medicare HMO | Admitting: Internal Medicine

## 2022-06-04 VITALS — BP 124/80 | HR 76 | Temp 98.4°F | Resp 10 | Ht 59.0 in | Wt 116.0 lb

## 2022-06-04 DIAGNOSIS — F419 Anxiety disorder, unspecified: Secondary | ICD-10-CM | POA: Diagnosis not present

## 2022-06-04 DIAGNOSIS — K219 Gastro-esophageal reflux disease without esophagitis: Secondary | ICD-10-CM | POA: Diagnosis not present

## 2022-06-04 DIAGNOSIS — R1310 Dysphagia, unspecified: Secondary | ICD-10-CM | POA: Diagnosis not present

## 2022-06-04 DIAGNOSIS — F32A Depression, unspecified: Secondary | ICD-10-CM | POA: Diagnosis not present

## 2022-06-04 DIAGNOSIS — I10 Essential (primary) hypertension: Secondary | ICD-10-CM | POA: Diagnosis not present

## 2022-06-04 DIAGNOSIS — K222 Esophageal obstruction: Secondary | ICD-10-CM

## 2022-06-04 MED ORDER — SODIUM CHLORIDE 0.9 % IV SOLN: 500.0000 mL | Freq: Once | INTRAVENOUS | Status: DC

## 2022-06-04 NOTE — Progress Notes (Signed)
A and O x3. Report to RN. Tolerated MAC anesthesia well.Teeth unchanged after procedure. 

## 2022-06-04 NOTE — Op Note (Signed)
[place] Patient Name: Heidi Shah Procedure Date: 06/04/2022 10:02 AM MRN: [id number] Endoscopist: Docia Chuck. Henrene Pastor , MD, [id number] Age: 77 Referring MD:  Date of Birth: February 06, 1946 Gender: Female Account #: [id number] Procedure:                Upper GI endoscopy with [facility] dilation of the                            esophagus. 67 [place] Indications:              Dysphagia, Therapeutic procedure, Esophageal reflux Medicines:                Monitored Anesthesia Care Procedure:                Pre-Anesthesia Assessment:                           - Prior to the procedure, a History and Physical                            was performed, and patient medications and                            allergies were reviewed. The patient's tolerance of                            previous anesthesia was also reviewed. The risks                            and benefits of the procedure and the sedation                            options and risks were discussed with the patient.                            All questions were answered, and informed consent                            was obtained. Prior Anticoagulants: The patient has                            taken no anticoagulant or antiplatelet agents. ASA                            Grade Assessment: II - A patient with mild systemic                            disease. After reviewing the risks and benefits,                            the patient was deemed in satisfactory condition to                            undergo the procedure.  After obtaining informed consent, the endoscope was                            passed under direct vision. Throughout the                            procedure, the patient's blood pressure, pulse, and                            oxygen saturations were monitored continuously. The                            Olympus scope [id number] was introduced through the                             mouth, and advanced to the second part of duodenum.                            The upper GI endoscopy was accomplished without                            difficulty. The patient tolerated the procedure                            well. Scope In: Scope Out: Findings:                 One benign-appearing, intrinsic moderate stenosis                            was found 35 cm from the incisors. This stenosis                            measured 1.5 cm (inner diameter). The scope was                            withdrawn. Dilation was performed with a Maloney                            dilator with no resistance at 26 Fr.                           The exam of the esophagus was otherwise normal.                           The stomach was normal. Moderate hiatal hernia.                           The examined duodenum was normal.                           The cardia and gastric fundus were normal on                            retroflexion. NOTE: Retroflexed  image taken but not                            captured Complications:            No immediate complications. Estimated Blood Loss:     Estimated blood loss: none. Impression:               - Benign-appearing esophageal stenosis. Dilated.                           - Normal stomach. Moderate hiatal hernia.                           - Normal examined duodenum.                           - No specimens collected. Recommendation:           - Patient has a contact number available for                            emergencies. The signs and symptoms of potential                            delayed complications were discussed with the                            patient. Return to normal activities tomorrow.                            Written discharge instructions were provided to the                            patient.                           - Post dilation diet.                           - Continue present medications.                           -  Return to the care of your PCP. GI follow-up as                            needed Docia Chuck. Henrene Pastor, MD 06/04/2022 10:28:20 AM This report has been signed electronically.

## 2022-06-04 NOTE — Progress Notes (Signed)
Called to room to assist during endoscopic procedure.  Patient ID and intended procedure confirmed with present staff. Received instructions for my participation in the procedure from the performing physician.  

## 2022-06-04 NOTE — Progress Notes (Signed)
HISTORY OF PRESENT ILLNESS:   Heidi Shah is a 77 y.o. female, retired Radio producer, with a history of GERD complicated by peptic stricture, gastric ulcers, and surveillance colonoscopies.  She presents today for follow-up.  She has a chief complaint of recurrent intermittent solid food and occasional liquid dysphagia for which she has been dilated in the past.  She feels that she needs the same.  She continues on omeprazole 40 mg daily for her GERD.  For the most part, well-controlled.  She was last seen in the office Aug 01, 2020.  See that dictation.  She has aged out of colon cancer surveillance with her last examination being March 2022.  Her last upper endoscopy was also performed in March 2022.  At that time she was found to have a distal esophageal stricture and prepyloric ulcers.  Villous appearing tissue emanating from the distal portion of the ampulla was biopsied and returned showing just peptic duodenitis.  She was dilated with 54 [place] Maloney dilator.  This helped her dysphagia.   GI review of systems is otherwise negative.  She does report having problems with persistent hoarseness 2 weeks after left shoulder surgery.  She has been seen by ENT with ongoing evaluation.  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   Review of blood work from December 2023 shows unremarkable comprehensive metabolic panel with normal liver test.  Normal CBC with hemoglobin 13.7.   REVIEW OF SYSTEMS:   All non-GI ROS negative unless otherwise stated in the HPI except for cough, heart murmur       Past Medical History:  Diagnosis Date   Allergy     Anxiety     Arthritis     Cataract      surgery - Bilateral cataracts removed   Chicken pox     Colon polyps     Depression     Diverticulitis     Diverticulosis     Esophageal stricture     Family history of adverse reaction to anesthesia      difficult time waking mother up   GERD (gastroesophageal reflux disease)     Heart murmur      Hypertension             Past Surgical History:  Procedure Laterality Date   cataract surgery Bilateral 1996   COLONOSCOPY   2022   EYE SURGERY   2000    detached L retina   HERNIA REPAIR Bilateral 1990    inguinal   NECK SURGERY        as a child   POLYPECTOMY   2022   SHOULDER ARTHROSCOPY WITH ROTATOR CUFF REPAIR AND OPEN BICEPS TENODESIS Left 12/10/2021    Procedure: LEFT SHOULDER ARTHROSCOPY WITH ROTATOR CUFF REPAIR AND OPEN BICEPS TENODESIS;  Surgeon: Vanetta Mulders, MD;  Location: [place];  Service: Orthopedics;  Laterality: Left;   UPPER GASTROINTESTINAL ENDOSCOPY   2022      Social History Mckaela Philipp Ludden  reports that she has never smoked. She has never used smokeless tobacco. She reports current alcohol use of about 1.0 standard drink of alcohol per week. She reports that she does not use drugs.   family history includes COPD in her mother; Cancer in her father; Crohn's disease in her mother; Heart disease (age of onset: 56) in her mother; Heart disease (age of onset: 78) in her father.        Allergies  Allergen Reactions   Beeswax  Per skin test from dermatologist   Cetrimonium Chloride [Cetrimide]        Per skin test from dermatologist   Methylisothiazolinone        Per skin test from dermatologist   Neomycin Sulfate [Neomycin]        Per skin test from dermatologist   Nickel Sulfate [Nickel]        Per skin test from dermatologist   Penicillins        Childhood Reaction    Propolis        Per skin test from dermatologist          PHYSICAL EXAMINATION: Vital signs: BP 126/80   Pulse (!) 114   Ht '4\' 11"'$  (1.499 m)   Wt 116 lb (52.6 kg)   BMI 23.43 kg/m   Constitutional: generally well-appearing, no acute distress Psychiatric: alert and oriented x3, cooperative Eyes: extraocular movements intact, anicteric, conjunctiva pink Mouth: oral pharynx moist, no lesions Neck: supple no lymphadenopathy Cardiovascular: heart regular rate and  rhythm Lungs: clear to auscultation bilaterally Abdomen: soft, nontender, nondistended, no obvious ascites, no peritoneal signs, normal bowel sounds, no organomegaly Rectal: Omitted Extremities: no clubbing, cyanosis, or lower extremity edema bilaterally Skin: no lesions on visible extremities Neuro: No focal deficits.  Cranial nerves intact   ASSESSMENT:   1.  GERD complicated by peptic stricture.  Previously requiring dilation.  Currently on omeprazole 40 mg daily.  She request refill.  Classic reflux symptoms seemingly well-controlled. 2.  Recurrent dysphagia.  Likely secondary to recurrent peptic stricture.  Would benefit from dilation. 3.  History of colon polyps.  Colonoscopy 2022.  Aged out of surveillance     PLAN:   1.  Reflux precautions 2.  Refill omeprazole 40 mg daily; #90; 4 refills.  Medication risk reviewed 3.  Schedule upper endoscopy with esophageal dilation.The nature of the procedure, as well as the risks, benefits, and alternatives were carefully and thoroughly reviewed with the patient. Ample time for discussion and questions allowed. The patient understood, was satisfied, and agreed to proceed. 4.  Follow-up thereafter to be determined

## 2022-06-04 NOTE — Patient Instructions (Signed)
YOU HAD AN ENDOSCOPIC PROCEDURE TODAY AT [facility]:   Refer to the procedure report that was given to you for any specific questions about what was found during the examination.  If the procedure report does not answer your questions, please call your gastroenterologist to clarify.  If you requested that your care partner not be given the details of your procedure findings, then the procedure report has been included in a sealed envelope for you to review at your convenience later.  YOU SHOULD EXPECT: Some feelings of bloating in the abdomen. Passage of more gas than usual.  Walking can help get rid of the air that was put into your GI tract during the procedure and reduce the bloating.  Please Note:  You might notice some irritation and congestion in your nose or some drainage.  This is from the oxygen used during your procedure.  There is no need for concern and it should clear up in a day or so.  SYMPTOMS TO REPORT IMMEDIATELY:   Following upper endoscopy (EGD)  Vomiting of blood or coffee ground material  New chest pain or pain under the shoulder blades  Painful or persistently difficult swallowing  New shortness of breath  Fever of 100F or higher  Black, tarry-looking stools  For urgent or emergent issues, a gastroenterologist can be reached at any hour by calling [phone]. Do not use MyChart messaging for urgent concerns.    DIET:  We do recommend nothing by mouth until 10:30.  From 10:30-11:30 clear liquids.  Soft diet for the rest of today. Then you may proceed to your regular diet tomorrow..  Drink plenty of fluids but you should avoid alcoholic beverages for 24 hours.  ACTIVITY:  You should plan to take it easy for the rest of today and you should NOT DRIVE or use heavy machinery until tomorrow (because of the sedation medicines used during the test).    FOLLOW UP: Our staff will call the number listed on your records the next business day following your  procedure.  We will call around 7:15- 8:00 am to check on you and address any questions or concerns that you may have regarding the information given to you following your procedure. If we do not reach you, we will leave a message.      SIGNATURES/CONFIDENTIALITY: You and/or your care partner have signed paperwork which will be entered into your electronic medical record.  These signatures attest to the fact that that the information above on your After Visit Summary has been reviewed and is understood.  Full responsibility of the confidentiality of this discharge information lies with you and/or your care-partner.

## 2022-06-07 ENCOUNTER — Encounter: Payer: Self-pay | Admitting: Family Medicine

## 2022-06-07 ENCOUNTER — Telehealth: Payer: Self-pay | Admitting: *Deleted

## 2022-06-07 ENCOUNTER — Ambulatory Visit (INDEPENDENT_AMBULATORY_CARE_PROVIDER_SITE_OTHER): Payer: Medicare HMO | Admitting: Family Medicine

## 2022-06-07 VITALS — BP 120/70 | HR 95 | Temp 98.1°F | Ht 59.0 in | Wt 116.5 lb

## 2022-06-07 DIAGNOSIS — R319 Hematuria, unspecified: Secondary | ICD-10-CM

## 2022-06-07 LAB — POC URINALSYSI DIPSTICK (AUTOMATED)
Bilirubin, UA: NEGATIVE
Blood, UA: NEGATIVE
Glucose, UA: NEGATIVE
Ketones, UA: NEGATIVE
Leukocytes, UA: NEGATIVE
Nitrite, UA: NEGATIVE
Protein, UA: NEGATIVE
Spec Grav, UA: 1.015 (ref 1.010–1.025)
Urobilinogen, UA: 0.2 E.U./dL
pH, UA: 6 (ref 5.0–8.0)

## 2022-06-07 NOTE — Telephone Encounter (Signed)
  Follow up Call-     06/04/2022    9:15 AM 06/16/2020    8:51 AM  Call back number  Post procedure Call Back phone  # [phone] cell  Permission to leave phone message Yes Yes     Patient questions:  Do you have a fever, pain , or abdominal swelling? No. Pain Score  0 *  Have you tolerated food without any problems? Yes.    Have you been able to return to your normal activities? Yes.    Do you have any questions about your discharge instructions: Diet   No. Medications  No. Follow up visit  No.  Do you have questions or concerns about your Care? No.  Actions: * If pain score is 4 or above: No action needed, pain <4.

## 2022-06-07 NOTE — Progress Notes (Signed)
Established Patient Office Visit  Subjective   Patient ID: Heidi Shah, female    DOB: [birth year]  Age: 77 y.o. MRN: [id number]  Chief Complaint  Patient presents with   Hematuria    Patient complains of hematuria, x3 weeks     HPI   Heidi Shah is seen with couple episodes of painless hematuria.  First episode occurred about 3 weeks ago.  Then she went approximately a week with no episodes and then had a second episode about a week ago but none since then.  Denies any burning with urination.  No flank pain.  No history of kidney stones.  No fever or chills.  Appetite and weight stable.  She has never smoked.  She states her father was diagnosed with kidney cancer at age 36 and died of this age 80.  She denies any vaginal spotting or bleeding.  Past Medical History:  Diagnosis Date   Allergy    Anxiety    Arthritis    Cataract    surgery - Bilateral cataracts removed   Chicken pox    Colon polyps    Depression    Diverticulitis    Diverticulosis    Esophageal stricture    Family history of adverse reaction to anesthesia    difficult time waking mother up   GERD (gastroesophageal reflux disease)    Heart murmur    Hypertension    Osteoporosis    Past Surgical History:  Procedure Laterality Date   cataract surgery Bilateral 1996   COLONOSCOPY  2022   EYE SURGERY  2000   detached L retina   HERNIA REPAIR Bilateral 1990   inguinal   NECK SURGERY     as a child   POLYPECTOMY  2022   SHOULDER ARTHROSCOPY WITH ROTATOR CUFF REPAIR AND OPEN BICEPS TENODESIS Left 12/10/2021   Procedure: LEFT SHOULDER ARTHROSCOPY WITH ROTATOR CUFF REPAIR AND OPEN BICEPS TENODESIS;  Surgeon: Vanetta Mulders, MD;  Location: [place];  Service: Orthopedics;  Laterality: Left;   UPPER GASTROINTESTINAL ENDOSCOPY  2022    reports that she has never smoked. She has never used smokeless tobacco. She reports current alcohol use of about 1.0 standard drink of alcohol per week. She reports that she does  not use drugs. family history includes COPD in her mother; Cancer in her father; Crohn's disease in her mother; Heart disease (age of onset: 25) in her mother; Heart disease (age of onset: 25) in her father. Allergies  Allergen Reactions   Beeswax     Per skin test from dermatologist   Cetrimonium Chloride [Cetrimide]     Per skin test from dermatologist   Methylisothiazolinone     Per skin test from dermatologist   Neomycin Sulfate [Neomycin]     Per skin test from dermatologist   Nickel Sulfate [Nickel]     Per skin test from dermatologist   Penicillins     Childhood Reaction    Propolis     Per skin test from dermatologist    Review of Systems  Constitutional:  Negative for chills, fever and weight loss.  Gastrointestinal:  Negative for abdominal pain.  Genitourinary:  Positive for hematuria. Negative for dysuria, flank pain, frequency and urgency.      Objective:     BP 120/70 (BP Location: Left Arm, Patient Position: Sitting, Cuff Size: Normal)   Pulse 95   Temp 98.1 F (36.7 C) (Oral)   Ht '4\' 11"'$  (1.499 m)   Wt 116 lb 8 oz (  52.8 kg)   SpO2 98%   BMI 23.53 kg/m    Physical Exam Vitals reviewed.  Constitutional:      Appearance: Normal appearance.  Cardiovascular:     Rate and Rhythm: Normal rate and regular rhythm.  Pulmonary:     Effort: Pulmonary effort is normal.     Breath sounds: Normal breath sounds.  Neurological:     Mental Status: She is alert.      Results for orders placed or performed in visit on 06/07/22  POCT Urinalysis Dipstick (Automated)  Result Value Ref Range   Color, UA Yellow    Clarity, UA Clear    Glucose, UA Negative Negative   Bilirubin, UA Negative    Ketones, UA Negative    Spec Grav, UA 1.015 1.010 - 1.025   Blood, UA Negative    pH, UA 6.0 5.0 - 8.0   Protein, UA Negative Negative   Urobilinogen, UA 0.2 0.2 or 1.0 E.U./dL   Nitrite, UA Negative    Leukocytes, UA Negative Negative      The 10-year ASCVD risk  score (Arnett DK, et al., 2019) is: 24.8%    Assessment & Plan:   Problem List Items Addressed This Visit   None Visit Diagnoses     Painless hematuria    -  Primary   Relevant Orders   POCT Urinalysis Dipstick (Automated) (Completed)     Patient presents with 2 episodes over the past 3 weeks of painless gross hematuria.  Urine dipstick today is normal with no blood, nitrites, or leukocytes.  We discussed differential for painless hematuria.  Even though her urine today does not show any blood we do recommend urologic referral given the fact she has had couple episodes of gross hematuria  No follow-ups on file.    Carolann Littler, MD

## 2022-06-08 ENCOUNTER — Encounter: Payer: Self-pay | Admitting: Family Medicine

## 2022-06-08 ENCOUNTER — Ambulatory Visit: Payer: Medicare HMO

## 2022-06-14 ENCOUNTER — Ambulatory Visit: Payer: Medicare HMO | Attending: Unknown Physician Specialty

## 2022-06-14 ENCOUNTER — Other Ambulatory Visit: Payer: Self-pay

## 2022-06-14 DIAGNOSIS — R1310 Dysphagia, unspecified: Secondary | ICD-10-CM | POA: Diagnosis not present

## 2022-06-14 DIAGNOSIS — R490 Dysphonia: Secondary | ICD-10-CM | POA: Diagnosis not present

## 2022-06-14 NOTE — Therapy (Signed)
OUTPATIENT SPEECH LANGUAGE PATHOLOGY VOICE EVALUATION   Patient Name: Heidi Shah MRN: [id number] DOB:08/14/1945, 77 y.o., female Today's Date: 06/14/2022  PCP: Carolann Littler, MD REFERRING PROVIDER: Beverly Gust, MD  END OF SESSION:  End of Session - 06/14/22 1759     Visit Number 1    Number of Visits 17    Date for SLP Re-Evaluation 08/13/22    SLP Start Time 57    SLP Stop Time  1400    SLP Time Calculation (min) 40 min    Activity Tolerance Patient tolerated treatment well             Past Medical History:  Diagnosis Date   Allergy    Anxiety    Arthritis    Cataract    surgery - Bilateral cataracts removed   Chicken pox    Colon polyps    Depression    Diverticulitis    Diverticulosis    Esophageal stricture    Family history of adverse reaction to anesthesia    difficult time waking mother up   GERD (gastroesophageal reflux disease)    Heart murmur    Hypertension    Osteoporosis    Past Surgical History:  Procedure Laterality Date   cataract surgery Bilateral 1996   COLONOSCOPY  2022   EYE SURGERY  2000   detached L retina   HERNIA REPAIR Bilateral 1990   inguinal   NECK SURGERY     as a child   POLYPECTOMY  2022   SHOULDER ARTHROSCOPY WITH ROTATOR CUFF REPAIR AND OPEN BICEPS TENODESIS Left 12/10/2021   Procedure: LEFT SHOULDER ARTHROSCOPY WITH ROTATOR CUFF REPAIR AND OPEN BICEPS TENODESIS;  Surgeon: Vanetta Mulders, MD;  Location: [place];  Service: Orthopedics;  Laterality: Left;   UPPER GASTROINTESTINAL ENDOSCOPY  2022   Patient Active Problem List   Diagnosis Date Noted   Traumatic complete tear of left rotator cuff    Cervical spondylosis with myelopathy and radiculopathy 03/14/2020   Neck pain 03/14/2020   Aortic heart murmur 02/25/2020   Osteoporosis 04/04/2019   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   Unspecified constipation 03/05/2013   GERD (gastroesophageal reflux disease) 01/19/2013   Esophageal spasm 01/19/2013    Dysphagia, unspecified(787.20) 01/19/2013   Diverticulitis of colon (without mention of hemorrhage)(562.11) 11/23/2012   History of depression 11/23/2012   Essential hypertension, benign 11/23/2012    Onset date: 12-12-21  REFERRING DIAG: R49.0 (ICD-10-CM) - Voice hoarseness J38.00 (ICD-10-CM) - Vocal cord paralysis  THERAPY DIAG:  Hoarseness - Plan: SLP plan of care cert/re-cert  Dysphagia, unspecified type - Plan: SLP plan of care cert/re-cert  Rationale for Evaluation and Treatment: Rehabilitation  SUBJECTIVE:   SUBJECTIVE STATEMENT: "My voice today is a 5-6 out of 10 (10-WNL voice). Pt accompanied by: self  PERTINENT HISTORY:  Hoarseness since rotator cuff sx September 2023, has not yet resolved, but "a little better" since esophageal dilation 06-04-22. Pt had ENT eval Decrmber 2023 which found rt paralyzed vocal fold, confirmed with CT. Pt reports needing longstanding esophageal dilation approx every two years. ("I just feel it is not as easy to pass food and so I go in to have it stretched."), GERD, HTN, Heart murmur, lt shoulder arthroscopy with rotator cuff repair in September  PAIN:  Are you having pain? No  FALLS: Has patient fallen in last 6 months? No,   LIVING ENVIRONMENT: Lives with: lives alone Lives in: House/apartment  PLOF: Independent  PATIENT GOALS: Improve my voice  OBJECTIVE:   DIAGNOSTIC FINDINGS:  CT NECK WITH CONTRAST FINDINGS: Pharynx and larynx: Medialization of the right true vocal cord with asymmetric enlargement of the right laryngeal ventricle and right piriform sinus. No evidence of significant swelling or mass  COGNITION: Overall cognitive status: Within functional limits for tasks assessed  SWALLOWING:  Pt states her swallowing is at baseline, however SLP told pt that unilateral vocal fold paralysis may worsen swallow function. Pt may require MBSS/FEES during this therapy course.  SOCIAL HISTORY: Occupation: Retired Pensions consultant use: minimal  PERCEPTUAL VOICE ASSESSMENT: Voice quality: hoarse, rough, low vocal intensity, and aphonic Vocal abuse: habitual throat clearing; twelve in today's 40-minute session Resonance: normal Respiratory function: thoracic breathing and clavicular breathing  OBJECTIVE VOICE ASSESSMENT: Maximum phonation time for sustained "ah": 12.1 Conversational pitch average: 314 Hz Conversational pitch range: 86-725 Hz Conversational loudness average: 73 dB Conversational loudness range: 67-81 dB S/z ratio: 2.1 (Suggestive of dysfunction >1.0)   PATIENT REPORTED OUTCOME MEASURES (PROM): VHI: to be completed in first 1-2 sessions  TODAY'S TREATMENT:                                                                                                                                         DATE:  PhoRTE (phonatory resistance training exercises), AB (Abdominal breathing)  06/14/22: SLP used digital manipulation and adductive maneuvers (opposing palms, pushing on chair arms, pulling up on seat of chair). SLP and pt found with this diagnostic treatment that opposing palms and pulling up on chair decr'd pt's level of aphonia, as did digital manipulation of thyroid cartilage slightly to left. Pt performed his on herself and stated, "It sounds better doesn't it?" Pt's range in lowest to highest pitch went from 700Hz  without compensation to 351Hz , and average pitch decr'd from 289 Hz to 167 Hz with compensations. SLP noted notable improvement in quality and presence of vocalization/phonatory signal when pt used compenstion of digital manipulation of thyroid and pulling up on chair. SLP will give pt laryngeal exercises next session.   PATIENT EDUCATION: Education details: See "Today's treatment" Person educated: Patient Education method: Explanation, Demonstration, and Verbal cues Education comprehension: verbalized understanding, returned demonstration, verbal cues required, and needs further  education  HOME EXERCISE PROGRAM: Laryngeal exercises and work with abdominal breathing (AB) to follow.  GOALS: Goals reviewed with patient? Yes  SHORT TERM GOALS: Target date: 07/16/22  Pt will perform laryngeal exercises with modified independence in 3 sessions Baseline: Goal status: INITIAL  2.  Pt will decr throat clearing to 3 or less/session in three sessions Baseline:  Goal status: INITIAL  3.  Pt will demonstrate vocal fold paralysis compensatory strategies in 2 minutes conversation in 3 sessions  Baseline:  Goal status: INITIAL  4.  Pt will maximize fluid intake between 3 sessions Baseline:  Goal status: INITIAL  5.  Pt will perform AB in sentence responses 80% success in 3 sessions Baseline:  Goal status:  INITIAL   LONG TERM GOALS: Target date: 08/13/22  Pt will perform laryngeal exercises with ndependence in 3 sessions Baseline:  Goal status: INITIAL  2.  Pt will decr throat clearing to 2 or less/session in three sessions Baseline:  Goal status: INITIAL  3.  Pt will independently demonstrate vocal fold paralysis compensatory strategies in 4 minutes conversation in 3 sessions  Baseline:  Goal status: INITIAL  4.  Pt will perform AB in 8 minutes simple conversation, 80%, in 3 sessions Baseline:  Goal status: INITIAL  5.  Pt will improve PROM to higher score than at initial administration Baseline:  Goal status: INITIAL   ASSESSMENT:  CLINICAL IMPRESSION: Patient is a 77 y.o. female who was seen today for hoarse voice, with diagnosis of rt unilateral vocal fold paralysis. She had an improvement in vocal quality (pt stated from 5-6/10 to a 7/10) after digital manipulation of thyroid cartilage to lt, moving rt vocal fold more towards midline to align with lt vocal fold. Pt will also be provided some laryngeal adduction exercises to perform. Pt states her swallowing is at baseline after her esophageal dilation 06/04/22, however SLP told pt that unilateral  vocal fold paralysis may impact swallow function. Pt may require MBSS/FEES during this therapy course.  OBJECTIVE IMPAIRMENTS: include voice disorder and dysphagia. These impairments are limiting patient from managing appointments, household responsibilities, ADLs/IADLs, effectively communicating at home and in community, and safety when swallowing. Factors affecting potential to achieve goals and functional outcome are previous level of function and severity of impairments.. Patient will benefit from skilled SLP services to address above impairments and improve overall function.  REHAB POTENTIAL: Excellent  PLAN:  SLP FREQUENCY: 1-2x/week  SLP DURATION: 8 weeks  PLANNED INTERVENTIONS: Environmental controls, Cueing hierachy, Internal/external aids, Functional tasks, SLP instruction and feedback, Compensatory strategies, Patient/family education, and voice exercises     Codylee Patil, CCC-SLP 06/14/2022, 5:59 PM

## 2022-06-17 ENCOUNTER — Ambulatory Visit: Payer: Medicare HMO

## 2022-06-17 DIAGNOSIS — R490 Dysphonia: Secondary | ICD-10-CM

## 2022-06-17 DIAGNOSIS — R1310 Dysphagia, unspecified: Secondary | ICD-10-CM

## 2022-06-17 NOTE — Therapy (Signed)
OUTPATIENT SPEECH LANGUAGE PATHOLOGY VOICE TREATMENT   Patient Name: Heidi Shah MRN: [id number] DOB:08-Dec-1945, 77 y.o., female Today's Date: 06/17/2022  PCP: Carolann Littler, MD REFERRING PROVIDER: Beverly Gust, MD  END OF SESSION:  End of Session - 06/17/22 1410     Visit Number 2    Number of Visits 17    Date for SLP Re-Evaluation 08/13/22    SLP Start Time 1407    SLP Stop Time  1445    SLP Time Calculation (min) 38 min    Activity Tolerance Patient tolerated treatment well             Past Medical History:  Diagnosis Date   Allergy    Anxiety    Arthritis    Cataract    surgery - Bilateral cataracts removed   Chicken pox    Colon polyps    Depression    Diverticulitis    Diverticulosis    Esophageal stricture    Family history of adverse reaction to anesthesia    difficult time waking mother up   GERD (gastroesophageal reflux disease)    Heart murmur    Hypertension    Osteoporosis    Past Surgical History:  Procedure Laterality Date   cataract surgery Bilateral 1996   COLONOSCOPY  2022   EYE SURGERY  2000   detached L retina   HERNIA REPAIR Bilateral 1990   inguinal   NECK SURGERY     as a child   POLYPECTOMY  2022   SHOULDER ARTHROSCOPY WITH ROTATOR CUFF REPAIR AND OPEN BICEPS TENODESIS Left 12/10/2021   Procedure: LEFT SHOULDER ARTHROSCOPY WITH ROTATOR CUFF REPAIR AND OPEN BICEPS TENODESIS;  Surgeon: Vanetta Mulders, MD;  Location: [place];  Service: Orthopedics;  Laterality: Left;   UPPER GASTROINTESTINAL ENDOSCOPY  2022   Patient Active Problem List   Diagnosis Date Noted   Traumatic complete tear of left rotator cuff    Cervical spondylosis with myelopathy and radiculopathy 03/14/2020   Neck pain 03/14/2020   Aortic heart murmur 02/25/2020   Osteoporosis 04/04/2019   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   Unspecified constipation 03/05/2013   GERD (gastroesophageal reflux disease) 01/19/2013   Esophageal spasm 01/19/2013    Dysphagia, unspecified(787.20) 01/19/2013   Diverticulitis of colon (without mention of hemorrhage)(562.11) 11/23/2012   History of depression 11/23/2012   Essential hypertension, benign 11/23/2012    Onset date: 12-12-21  REFERRING DIAG: R49.0 (ICD-10-CM) - Voice hoarseness J38.00 (ICD-10-CM) - Vocal cord paralysis  THERAPY DIAG:  Hoarseness  Dysphagia, unspecified type  Rationale for Evaluation and Treatment: Rehabilitation  SUBJECTIVE:   SUBJECTIVE STATEMENT: "My voice today is better. It still gets tired in the afternoons, though." Pt accompanied by: self  PERTINENT HISTORY:  Hoarseness since rotator cuff sx September 2023, has not yet resolved, but "a little better" since esophageal dilation 06-04-22. Pt had ENT eval Decrmber 2023 which found rt paralyzed vocal fold, confirmed with CT. Pt reports needing longstanding esophageal dilation approx every two years. ("I just feel it is not as easy to pass food and so I go in to have it stretched."), GERD, HTN, Heart murmur, lt shoulder arthroscopy with rotator cuff repair in September  PAIN:  Are you having pain? No   PATIENT GOALS: Improve my voice  OBJECTIVE:   SWALLOWING:  Pt states her swallowing is at baseline, however SLP told pt that unilateral vocal fold paralysis may worsen swallow function. Pt may require MBSS/FEES during this therapy course.   PATIENT REPORTED OUTCOME MEASURES (PROM):  VHI: to be completed in first 1-2 sessions  TODAY'S TREATMENT:                                                                                                                                         DATE:  PhoRTE (phonatory resistance training exercises), AB (Abdominal breathing)  06/17/22: SLP worked with pt today on abdominal breathing which pt did not achieve until she moved into reclined position, then she was 90% successful. SLP told pt to practice AB in reclined or supine 15 minutes BID and explained rationale for this. Pt stated  she could use physical manipulations of digital manipulation of thyroid x3 until next session. SLP encouraged pt to use pulling up on chair, or pushing palms when using phone - to achieve better phonation.  06/14/22: SLP used digital manipulation and adductive maneuvers (opposing palms, pushing on chair arms, pulling up on seat of chair). SLP and pt found with this diagnostic treatment that opposing palms and pulling up on chair decr'd pt's level of aphonia, as did digital manipulation of thyroid cartilage slightly to left. Pt performed his on herself and stated, "It sounds better doesn't it?" Pt's range in lowest to highest pitch went from 700Hz  without compensation to 351Hz , and average pitch decr'd from 289 Hz to 167 Hz with compensations. SLP noted notable improvement in quality and presence of vocalization/phonatory signal when pt used compenstion of digital manipulation of thyroid and pulling up on chair. SLP will give pt laryngeal exercises next session.   PATIENT EDUCATION: Education details: See "Today's treatment" Person educated: Patient Education method: Explanation, Demonstration, and Verbal cues Education comprehension: verbalized understanding, returned demonstration, verbal cues required, and needs further education  HOME EXERCISE PROGRAM: Laryngeal exercises and work with abdominal breathing (AB) to follow.  GOALS: Goals reviewed with patient? Yes  SHORT TERM GOALS: Target date: 07/16/22  Pt will perform laryngeal exercises with modified independence in 3 sessions Baseline: Goal status: [place]  2.  Pt will decr throat clearing to 3 or less/session in three sessions Baseline:  Goal status: [place]  3.  Pt will demonstrate vocal fold paralysis compensatory strategies in 2 minutes conversation in 3 sessions  Baseline:  Goal status: [place]  4.  Pt will maximize fluid intake between 3 sessions Baseline:  Goal status: [place]  5.  Pt will perform AB in sentence responses  80% success in 3 sessions Baseline:  Goal status: [place]   LONG TERM GOALS: Target date: 08/13/22  Pt will perform laryngeal exercises with ndependence in 3 sessions Baseline:  Goal status: [place]  2.  Pt will decr throat clearing to 2 or less/session in three sessions Baseline:  Goal status: [place]  3.  Pt will independently demonstrate vocal fold paralysis compensatory strategies in 4 minutes conversation in 3 sessions  Baseline:  Goal status: [place]  4.  Pt will perform AB in 8 minutes simple conversation, 80%,  in 3 sessions Baseline:  Goal status: [place]  5.  Pt will improve PROM to higher score than at initial administration Baseline:  Goal status: [place]   ASSESSMENT:  CLINICAL IMPRESSION: Patient is a 77 y.o. female who was seen today for hoarse voice, with diagnosis of rt unilateral vocal fold paralysis. She had an improvement in vocal quality (pt stated from 5-6/10 to a 7/10) after digital manipulation of thyroid cartilage to lt, moving rt vocal fold more towards midline to align with lt vocal fold. Pt states her swallowing is at baseline after her esophageal dilation 06/04/22, however SLP told pt that unilateral vocal fold paralysis may impact swallow function. Pt may require MBSS/FEES during this therapy course.  OBJECTIVE IMPAIRMENTS: include voice disorder and dysphagia. These impairments are limiting patient from managing appointments, household responsibilities, ADLs/IADLs, effectively communicating at home and in community, and safety when swallowing. Factors affecting potential to achieve goals and functional outcome are previous level of function and severity of impairments.. Patient will benefit from skilled SLP services to address above impairments and improve overall function.  REHAB POTENTIAL: Excellent  PLAN:  SLP FREQUENCY: 1-2x/week  SLP DURATION: 8 weeks  PLANNED INTERVENTIONS: Environmental controls, Cueing hierachy, Internal/external aids,  Functional tasks, SLP instruction and feedback, Compensatory strategies, Patient/family education, and voice exercises     Dorthy Magnussen, CCC-SLP 06/17/2022, 2:11 PM

## 2022-06-21 ENCOUNTER — Ambulatory Visit: Payer: Medicare HMO

## 2022-06-22 ENCOUNTER — Ambulatory Visit: Payer: Medicare HMO

## 2022-06-23 ENCOUNTER — Ambulatory Visit (HOSPITAL_BASED_OUTPATIENT_CLINIC_OR_DEPARTMENT_OTHER): Payer: Medicare HMO | Admitting: Orthopaedic Surgery

## 2022-06-23 DIAGNOSIS — M25512 Pain in left shoulder: Secondary | ICD-10-CM | POA: Diagnosis not present

## 2022-06-23 DIAGNOSIS — S46012A Strain of muscle(s) and tendon(s) of the rotator cuff of left shoulder, initial encounter: Secondary | ICD-10-CM | POA: Diagnosis not present

## 2022-06-23 MED ORDER — LIDOCAINE HCL 1 % IJ SOLN
4.0000 mL | INTRAMUSCULAR | Status: AC | PRN
  Administered 2022-06-23: 4 mL

## 2022-06-23 MED ORDER — TRIAMCINOLONE ACETONIDE 40 MG/ML IJ SUSP
80.0000 mg | INTRAMUSCULAR | Status: AC | PRN
  Administered 2022-06-23: 80 mg via INTRA_ARTICULAR

## 2022-06-23 NOTE — Progress Notes (Signed)
Post Operative Evaluation    Procedure/Date of Surgery: 12/10/21 left shoulder rotator cuff repair and biceps tenodesis  Interval History:   Presents today 84-month status post left shoulder rotator cuff repair.  She is somewhat concerned as she did feel a pop although this was not during any specific exercise or activity.  She is here today for further assessment.  Her range of motion has improved and normalized with much better pain since that time.  PMH/PSH/Family History/Social History/Meds/Allergies:    Past Medical History:  Diagnosis Date  . Allergy   . Anxiety   . Arthritis   . Cataract    surgery - Bilateral cataracts removed  . Chicken pox   . Colon polyps   . Depression   . Diverticulitis   . Diverticulosis   . Esophageal stricture   . Family history of adverse reaction to anesthesia    difficult time waking mother up  . GERD (gastroesophageal reflux disease)   . Heart murmur   . Hypertension    Past Surgical History:  Procedure Laterality Date  . cataract surgery Bilateral 1996  . COLONOSCOPY  2022  . EYE SURGERY  2000   detached L retina  . HERNIA REPAIR Bilateral 1990   inguinal  . NECK SURGERY     as a child  . POLYPECTOMY  2022  . SHOULDER ARTHROSCOPY WITH ROTATOR CUFF REPAIR AND OPEN BICEPS TENODESIS Left 12/10/2021   Procedure: LEFT SHOULDER ARTHROSCOPY WITH ROTATOR CUFF REPAIR AND OPEN BICEPS TENODESIS;  Surgeon: Vanetta Mulders, MD;  Location: [facility];  Service: Orthopedics;  Laterality: Left;  . UPPER GASTROINTESTINAL ENDOSCOPY  2022   Social History   Socioeconomic History  . Marital status: Divorced    Spouse name: Not on file  . Number of children: 2  . Years of education: master's degree  . Highest education level: Master's degree (e.g., MA, MS, MEng, MEd, MSW, MBA)  Occupational History  . 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 Smoking status: Never  . Smokeless  tobacco: Never  Vaping Use  . Vaping Use: Never used  Substance and Sexual Activity  . Alcohol use: Yes    Alcohol/week: 1.0 standard drink of alcohol    Types: 1 Glasses of wine per week    Comment: one or two glasses of wine a couple times a week  . Drug use: No  . Sexual activity: Not on file  Other Topics Concern  . Not on file  Social History Narrative      Lives alone in ranch home.    2 children   Son lives in [place]   Continues to teach virtually part time   Social Determinants of Health   Financial Resource Strain: Low Risk  (05/19/2021)   Overall Financial Resource Strain (CARDIA)   . Difficulty of Paying Living Expenses: Not hard at all  Food Insecurity: No Food Insecurity (05/19/2021)   Hunger Vital Sign   . Worried About Charity fundraiser in the Last Year: Never true   . Ran Out of Food in the Last Year: Never true  Transportation Needs: No Transportation Needs (05/19/2021)   PRAPARE - Transportation   . Lack of Transportation (Medical): No   . Lack of Transportation (Non-Medical): No  Physical Activity: Sufficiently Active (05/19/2021)   Exercise Vital Sign   . Days of Exercise per Week: 3 days   . Minutes of Exercise per Session: 60 min  Stress: No Stress Concern Present (05/19/2021)   [place]   . Feeling of Stress : Only a little  Social Connections: Moderately Isolated (05/19/2021)   Social Connection and Isolation Panel [NHANES]   . Frequency of Communication with Friends and Family: Once a week   . Frequency of Social Gatherings with Friends and Family: Once a week   . Attends Religious Services: More than 4 times per year   . Active Member of Clubs or Organizations: Yes   . Attends Archivist Meetings: More than 4 times per year   . Marital Status: Divorced   Family History  Problem Relation Age of Onset  . Heart disease Mother 11  . COPD Mother   . Crohn's disease  Mother   . Heart disease Father 38  . Cancer Father        kidney  . Colon cancer Neg Hx   . Esophageal cancer Neg Hx   . Stomach cancer Neg Hx   . Rectal cancer Neg Hx    Allergies  Allergen Reactions  . Beeswax     Per skin test from dermatologist  . Cetrimonium Chloride [Cetrimide]     Per skin test from dermatologist  . Methylisothiazolinone     Per skin test from dermatologist  . Neomycin Sulfate [Neomycin]     Per skin test from dermatologist  . Nickel Sulfate [Nickel]     Per skin test from dermatologist  . Penicillins     Childhood Reaction   . Propolis     Per skin test from dermatologist   Current Outpatient Medications  Medication Sig Dispense Refill  . amLODipine (NORVASC) 2.5 MG tablet Take 1 tablet (2.5 mg total) by mouth daily. 30 tablet 5  . azithromycin (ZITHROMAX Z-PAK) 250 MG tablet As directed 6 each 0  . Biotin w/ Vitamins C & E (HAIR SKIN & NAILS GUMMIES PO) Take 2 each by mouth daily.    . Calcium Carb-Cholecalciferol (CALCIUM 600+D3 PO) Take 1 tablet by mouth daily.    . Cholecalciferol 25 MCG (1000 UT) CHEW Chew 1,000 Units by mouth daily.    . cyanocobalamin (VITAMIN B12) 1000 MCG tablet Take 1,000 mcg by mouth daily.    . Ginkgo Biloba 60 MG CAPS Take 60 mg by mouth daily.    Marland Kitchen lisinopril-hydrochlorothiazide (ZESTORETIC) 20-12.5 MG tablet TAKE ONE TABLET BY MOUTH ONE TIME DAILY 90 tablet 0  . loratadine (CLARITIN) 10 MG tablet Take 10 mg by mouth daily.    . Misc Natural Products (ELDERBERRY IMMUNE COMPLEX PO) Take 1 each by mouth daily.    . Multiple Vitamin (MULTI-VITAMIN) tablet Take 1 tablet by mouth daily.    Marland Kitchen omeprazole (PRILOSEC) 40 MG capsule Take 1 capsule (40 mg total) by mouth daily. 90 capsule 3  . Probiotic Product (PROBIOTIC DAILY PO) Take 1 capsule by mouth daily.    Marland Kitchen triamcinolone cream (KENALOG) 0.1 % Apply 1 Application topically 2 (two) times daily as needed. 30 g 0  . vitamin C (ASCORBIC ACID) 500 MG tablet Take 500 mg by  mouth daily.    Marland Kitchen VYZULTA 0.024 % SOLN Place 1 drop into both eyes at bedtime.     No current facility-administered medications for this visit.   No results  found.  Review of Systems:   A ROS was performed including pertinent positives and negatives as documented in the HPI.   Musculoskeletal Exam:    There were no vitals taken for this visit.  Left shoulder incisions are healed.  In the sitting position she is able to forward elevate to 160 degrees on the left compared to 165 on the right.  She does need some additional coaching and assistance with passive elevation.  External rotation at the side is to 60 degrees bilaterally.  Internal rotation is to T12 bilaterally.  Strength is nearly equal to the contralateral side and forward elevation.  2+ radial pulse  Imaging:      I personally reviewed and interpreted the radiographs.   Assessment:   6 months weeks status post left shoulder rotator cuff repair and biceps tenodesis overall doing very well.  She did feel a pop recently and as result a point-of-care ultrasound was provided at the bedside which did show an intact rotator cuff repair and biceps tenodesis.  Given this I did also perform a subacromial ultrasound-guided injection as she is healed at this time I do believe that a significant portion of her symptoms are coming from subacromial impingement type phenomenon in the setting of a weaker posterior chain.  I specifically given her instructions to work on for strengthening the posterior chain with her band exercises.  I will plan to see her back in 3 months.  I did ask for her to send me a MyChart message if she is not feeling better within the next few weeks as we may need to revisit formal physical therapy for posterior chain strengthening program. Plan :    -She will return to clinic in 3 months for recheck     Procedure Note  Patient: Heidi Shah             Date of Birth: 05-06-1945           MRN:  [id number]             Visit Date: 06/23/2022  Procedures: Visit Diagnoses: No diagnosis found.  Large Joint Inj: L subacromial bursa on 06/23/2022 11:28 AM Indications: pain Details: 22 G 1.5 in needle, ultrasound-guided anterior approach  Arthrogram: No  Medications: 4 mL lidocaine 1 %; 80 mg triamcinolone acetonide 40 MG/ML Outcome: tolerated well, no immediate complications Procedure, treatment alternatives, risks and benefits explained, specific risks discussed. Consent was given by the patient. Immediately prior to procedure a time out was called to verify the correct patient, procedure, equipment, support staff and site/side marked as required. Patient was prepped and draped in the usual sterile fashion.            I personally saw and evaluated the patient, and participated in the management and treatment plan.  Vanetta Mulders, MD Attending Physician, Orthopedic Surgery  This document was dictated using Dragon voice recognition software. A reasonable attempt at proof reading has been made to minimize errors.

## 2022-06-24 ENCOUNTER — Ambulatory Visit: Payer: Medicare HMO

## 2022-06-24 DIAGNOSIS — R490 Dysphonia: Secondary | ICD-10-CM

## 2022-06-24 DIAGNOSIS — R1310 Dysphagia, unspecified: Secondary | ICD-10-CM

## 2022-06-24 NOTE — Patient Instructions (Signed)
    Practice 10 abdominal breaths before you practice PHoRTE exercises   Sing using a straw for 2 minutes before before you practice PHoRTE exercises   [place]  1) Hold "[place]" 10 times   As long as you can in a strong, intentional voice - feel it from your abdominal muscles!   [email or web address]  2) Count 1-10 (skip 7) in your INTENTIONAL voice  Start at as low pitch as you can, glide to as high pitch as you can, then back down as low as you can   [email or web address]  3) Say 10 sentences -two different ways, in your INTENTIONAL voice   (a) like you are calling over the fence to your neighbor in a higher-pitch voice  (b) like you are scolding your dog in a LOW authoritative voice   [email or web address]

## 2022-06-24 NOTE — Therapy (Signed)
OUTPATIENT SPEECH LANGUAGE PATHOLOGY VOICE TREATMENT   Patient Name: Heidi Shah MRN: [id number] DOB:Oct 28, 1945, 77 y.o., female Today's Date: 06/24/2022  PCP: Carolann Littler, MD REFERRING PROVIDER: Beverly Gust, MD  END OF SESSION:  End of Session - 06/24/22 1152     Visit Number 3    Number of Visits 17    Date for SLP Re-Evaluation 08/13/22    SLP Start Time 48    SLP Stop Time  75    SLP Time Calculation (min) 40 min    Activity Tolerance Patient tolerated treatment well             Past Medical History:  Diagnosis Date   Allergy    Anxiety    Arthritis    Cataract    surgery - Bilateral cataracts removed   Chicken pox    Colon polyps    Depression    Diverticulitis    Diverticulosis    Esophageal stricture    Family history of adverse reaction to anesthesia    difficult time waking mother up   GERD (gastroesophageal reflux disease)    Heart murmur    Hypertension    Osteoporosis    Past Surgical History:  Procedure Laterality Date   cataract surgery Bilateral 1996   COLONOSCOPY  2022   EYE SURGERY  2000   detached L retina   HERNIA REPAIR Bilateral 1990   inguinal   NECK SURGERY     as a child   POLYPECTOMY  2022   SHOULDER ARTHROSCOPY WITH ROTATOR CUFF REPAIR AND OPEN BICEPS TENODESIS Left 12/10/2021   Procedure: LEFT SHOULDER ARTHROSCOPY WITH ROTATOR CUFF REPAIR AND OPEN BICEPS TENODESIS;  Surgeon: Vanetta Mulders, MD;  Location: [facility];  Service: Orthopedics;  Laterality: Left;   UPPER GASTROINTESTINAL ENDOSCOPY  2022   Patient Active Problem List   Diagnosis Date Noted   Traumatic complete tear of left rotator cuff    Cervical spondylosis with myelopathy and radiculopathy 03/14/2020   Neck pain 03/14/2020   Aortic heart murmur 02/25/2020   Osteoporosis 04/04/2019   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   Unspecified constipation 03/05/2013   GERD (gastroesophageal reflux disease) 01/19/2013   Esophageal spasm 01/19/2013    Dysphagia, unspecified(787.20) 01/19/2013   Diverticulitis of colon (without mention of hemorrhage)(562.11) 11/23/2012   History of depression 11/23/2012   Essential hypertension, benign 11/23/2012    Onset date: 12-12-21  REFERRING DIAG: R49.0 (ICD-10-CM) - Voice hoarseness J38.00 (ICD-10-CM) - Vocal cord paralysis  THERAPY DIAG:  Hoarseness  Dysphagia, unspecified type  Rationale for Evaluation and Treatment: Rehabilitation  SUBJECTIVE:   SUBJECTIVE STATEMENT: "On days when it's like this with the weather - my voice today is "cracky" (pitch breaks)." Pt accompanied by: self  PERTINENT HISTORY:  Hoarseness since rotator cuff sx September 2023, has not yet resolved, but "a little better" since esophageal dilation 06-04-22. Pt had ENT eval Decrmber 2023 which found rt paralyzed vocal fold, confirmed with CT. Pt reports needing longstanding esophageal dilation approx every two years. ("I just feel it is not as easy to pass food and so I go in to have it stretched."), GERD, HTN, Heart murmur, lt shoulder arthroscopy with rotator cuff repair in September  PAIN:  Are you having pain? No   PATIENT GOALS: Improve my voice  OBJECTIVE:   SWALLOWING:  Pt states her swallowing is at baseline, however SLP told pt that unilateral vocal fold paralysis may worsen swallow function. Pt may require MBSS/FEES during this therapy course.   PATIENT  REPORTED OUTCOME MEASURES (PROM): VHI: to be completed in first 1-2 sessions  TODAY'S TREATMENT:                                                                                                                                         DATE:  PhoRTE (phonatory resistance training exercises), AB (Abdominal breathing)  06/24/22: Pt attempted the thyroid push to the lt with "a little better" voice over the weekend. SLP observed pt with AB and pt with 2-3 second inhale and then 3 second exhale. After 2 minutes pt endorsed she was having some light-headedness.  SLP encouraged pt to aim for 3- 3+ second inhale. She stated she did not have the light-headedness to the same degree with supine.  SLP educated pt on goal of straw singing ("feeling the buzz at your lips and your nose"), and practiced for approx 5 mintues. By end of 5 minutes pt stated she could feel "a lot more" buzzing than initially. Also educated pt about throat clearing and water sips as alternative. SLP needed to remind pt x2 about water after this, and pt with x3 more throat clears after SLP talked about water as alternative. SLP initiated PhoRTE with pt with excellent vocal quality with /a/. Told pt to complete PhoRTE BID.  06/17/22: SLP worked with pt today on abdominal breathing which pt did not achieve until she moved into reclined position, then she was 90% successful. SLP told pt to practice AB in reclined or supine 15 minutes BID and explained rationale for this. Pt stated she could use physical manipulations of digital manipulation of thyroid x3 until next session. SLP encouraged pt to use pulling up on chair, or pushing palms when using phone - to achieve better phonation.  06/14/22: SLP used digital manipulation and adductive maneuvers (opposing palms, pushing on chair arms, pulling up on seat of chair). SLP and pt found with this diagnostic treatment that opposing palms and pulling up on chair decr'd pt's level of aphonia, as did digital manipulation of thyroid cartilage slightly to left. Pt performed his on herself and stated, "It sounds better doesn't it?" Pt's range in lowest to highest pitch went from 700Hz  without compensation to 351Hz , and average pitch decr'd from 289 Hz to 167 Hz with compensations. SLP noted notable improvement in quality and presence of vocalization/phonatory signal when pt used compenstion of digital manipulation of thyroid and pulling up on chair. SLP will give pt laryngeal exercises next session.   PATIENT EDUCATION: Education details: See "Today's  treatment" Person educated: Patient Education method: Explanation, Demonstration, Verbal cues, and Handouts Education comprehension: verbalized understanding, returned demonstration, verbal cues required, and needs further education  HOME EXERCISE PROGRAM: Laryngeal exercises and work with abdominal breathing (AB) to follow.  GOALS: Goals reviewed with patient? Yes  SHORT TERM GOALS: Target date: 07/16/22  Pt will perform laryngeal exercises with modified independence in 3 sessions Baseline:  Goal status: [place]  2.  Pt will decr throat clearing to 3 or less/session in three sessions Baseline:  Goal status: [place]  3.  Pt will demonstrate vocal fold paralysis compensatory strategies in 2 minutes conversation in 3 sessions  Baseline:  Goal status: [place]  4.  Pt will maximize fluid intake between 3 sessions Baseline:  Goal status: [place]  5.  Pt will perform AB in sentence responses 80% success in 3 sessions Baseline:  Goal status: [place]   LONG TERM GOALS: Target date: 08/13/22  Pt will perform laryngeal exercises with ndependence in 3 sessions Baseline:  Goal status: [place]  2.  Pt will decr throat clearing to 2 or less/session in three sessions Baseline:  Goal status: [place]  3.  Pt will independently demonstrate vocal fold paralysis compensatory strategies in 4 minutes conversation in 3 sessions  Baseline:  Goal status: [place]  4.  Pt will perform AB in 8 minutes simple conversation, 80%, in 3 sessions Baseline:  Goal status: [place]  5.  Pt will improve PROM to higher score than at initial administration Baseline:  Goal status: [place]   ASSESSMENT:  CLINICAL IMPRESSION: Patient is a 77 y.o. female who was seen today for hoarse voice, with diagnosis of rt unilateral vocal fold paralysis. PhoRTE initiated today. She had an improvement in vocal quality (pt stated from 5-6/10 to a 7/10) after digital manipulation of thyroid cartilage to lt, moving rt  vocal fold more towards midline to align with lt vocal fold. Pt states her swallowing is at baseline after her esophageal dilation 06/04/22, however SLP told pt that unilateral vocal fold paralysis may impact swallow function. Pt may require MBSS/FEES during this therapy course.  OBJECTIVE IMPAIRMENTS: include voice disorder and dysphagia. These impairments are limiting patient from managing appointments, household responsibilities, ADLs/IADLs, effectively communicating at home and in community, and safety when swallowing. Factors affecting potential to achieve goals and functional outcome are previous level of function and severity of impairments.. Patient will benefit from skilled SLP services to address above impairments and improve overall function.  REHAB POTENTIAL: Excellent  PLAN:  SLP FREQUENCY: 1-2x/week  SLP DURATION: 8 weeks  PLANNED INTERVENTIONS: Environmental controls, Cueing hierachy, Internal/external aids, Functional tasks, SLP instruction and feedback, Compensatory strategies, Patient/family education, and voice exercises     Koreena Joost, CCC-SLP 06/24/2022, 11:53 AM

## 2022-06-28 DIAGNOSIS — H401131 Primary open-angle glaucoma, bilateral, mild stage: Secondary | ICD-10-CM | POA: Diagnosis not present

## 2022-06-29 ENCOUNTER — Ambulatory Visit: Payer: Medicare HMO | Attending: Unknown Physician Specialty

## 2022-06-29 DIAGNOSIS — R490 Dysphonia: Secondary | ICD-10-CM | POA: Insufficient documentation

## 2022-06-29 DIAGNOSIS — R1310 Dysphagia, unspecified: Secondary | ICD-10-CM

## 2022-06-29 NOTE — Therapy (Signed)
OUTPATIENT SPEECH LANGUAGE PATHOLOGY VOICE TREATMENT   Patient Name: Heidi Shah MRN: [id number] DOB:12/03/1945, 77 y.o., female Today's Date: 06/29/2022  PCP: Carolann Littler, MD REFERRING PROVIDER: Beverly Gust, MD  END OF SESSION:  End of Session - 06/29/22 0934     Visit Number 4    Number of Visits 17    Date for SLP Re-Evaluation 08/13/22    SLP Start Time 0934    SLP Stop Time  83    SLP Time Calculation (min) 41 min    Activity Tolerance Patient tolerated treatment well             Past Medical History:  Diagnosis Date   Allergy    Anxiety    Arthritis    Cataract    surgery - Bilateral cataracts removed   Chicken pox    Colon polyps    Depression    Diverticulitis    Diverticulosis    Esophageal stricture    Family history of adverse reaction to anesthesia    difficult time waking mother up   GERD (gastroesophageal reflux disease)    Heart murmur    Hypertension    Osteoporosis    Past Surgical History:  Procedure Laterality Date   cataract surgery Bilateral 1996   COLONOSCOPY  2022   EYE SURGERY  2000   detached L retina   HERNIA REPAIR Bilateral 1990   inguinal   NECK SURGERY     as a child   POLYPECTOMY  2022   SHOULDER ARTHROSCOPY WITH ROTATOR CUFF REPAIR AND OPEN BICEPS TENODESIS Left 12/10/2021   Procedure: LEFT SHOULDER ARTHROSCOPY WITH ROTATOR CUFF REPAIR AND OPEN BICEPS TENODESIS;  Surgeon: Vanetta Mulders, MD;  Location: [facility];  Service: Orthopedics;  Laterality: Left;   UPPER GASTROINTESTINAL ENDOSCOPY  2022   Patient Active Problem List   Diagnosis Date Noted   Traumatic complete tear of left rotator cuff    Cervical spondylosis with myelopathy and radiculopathy 03/14/2020   Neck pain 03/14/2020   Aortic heart murmur 02/25/2020   Osteoporosis 04/04/2019   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   Unspecified constipation 03/05/2013   GERD (gastroesophageal reflux disease) 01/19/2013   Esophageal spasm 01/19/2013    Dysphagia, unspecified(787.20) 01/19/2013   Diverticulitis of colon (without mention of hemorrhage)(562.11) 11/23/2012   History of depression 11/23/2012   Essential hypertension, benign 11/23/2012    Onset date: 12-12-21  REFERRING DIAG: R49.0 (ICD-10-CM) - Voice hoarseness J38.00 (ICD-10-CM) - Vocal cord paralysis  THERAPY DIAG:  Hoarseness  Dysphagia, unspecified type  Rationale for Evaluation and Treatment: Rehabilitation  SUBJECTIVE:   SUBJECTIVE STATEMENT: "I won't see you until the week after next." Pt leaving to go to care for grandchildren, one of whom broke his leg this week. Pt accompanied by: self  PERTINENT HISTORY:  Hoarseness since rotator cuff sx September 2023, has not yet resolved, but "a little better" since esophageal dilation 06-04-22. Pt had ENT eval Decrmber 2023 which found rt paralyzed vocal fold, confirmed with CT. Pt reports needing longstanding esophageal dilation approx every two years. ("I just feel it is not as easy to pass food and so I go in to have it stretched."), GERD, HTN, Heart murmur, lt shoulder arthroscopy with rotator cuff repair in September  PAIN:  Are you having pain? No   PATIENT GOALS: Improve my voice  OBJECTIVE:   SWALLOWING:  Pt states her swallowing is at baseline, however SLP told pt that unilateral vocal fold paralysis may worsen swallow function. Pt may require  MBSS/FEES during this therapy course.   PATIENT REPORTED OUTCOME MEASURES (PROM): VHI: to be completed in first 1-2 sessions  TODAY'S TREATMENT:                                                                                                                                         DATE:  PhoRTE (phonatory resistance training exercises), AB (Abdominal breathing)  06/29/22: SLP worked with pt with AB at rest - 100% success, and in speaking situations providing one word responses - min A occasionally faded to rarely with starting verbalization on top of breath cycle -  better after this cue from SLP. SLP incr'd complexity and pt answered simple "wh" questions with AB 95% success and verbalization at top of breath 90% of the time. Pt has worked with AB throughout day and not 15 minutes BID as asked, but pt's procedure seems to have had the same effect.  PhoRTE exercises have been completed as asked (BID), min A for pitch range exercise. Throat clearing x9 today, but decr'd after SLP spoke with pt and reminded her about throat clearing alternatives. Pt caught herself x3 and took sip instead, in last 10 minutes of session.  06/24/22: Pt attempted the thyroid push to the lt with "a little better" voice over the weekend. SLP observed pt with AB and pt with 2-3 second inhale and then 3 second exhale. After 2 minutes pt endorsed she was having some light-headedness. SLP encouraged pt to aim for 3- 3+ second inhale. She stated she did not have the light-headedness to the same degree with supine.  SLP educated pt on goal of straw singing ("feeling the buzz at your lips and your nose"), and practiced for approx 5 mintues. By end of 5 minutes pt stated she could feel "a lot more" buzzing than initially. Also educated pt about throat clearing and water sips as alternative. SLP needed to remind pt x2 about water after this, and pt with x3 more throat clears after SLP talked about water as alternative. SLP initiated PhoRTE with pt with excellent vocal quality with /a/. Told pt to complete PhoRTE BID.  06/17/22: SLP worked with pt today on abdominal breathing which pt did not achieve until she moved into reclined position, then she was 90% successful. SLP told pt to practice AB in reclined or supine 15 minutes BID and explained rationale for this. Pt stated she could use physical manipulations of digital manipulation of thyroid x3 until next session. SLP encouraged pt to use pulling up on chair, or pushing palms when using phone - to achieve better phonation.  06/14/22: SLP used digital  manipulation and adductive maneuvers (opposing palms, pushing on chair arms, pulling up on seat of chair). SLP and pt found with this diagnostic treatment that opposing palms and pulling up on chair decr'd pt's level of aphonia, as did digital manipulation of thyroid cartilage slightly to left.  Pt performed his on herself and stated, "It sounds better doesn't it?" Pt's range in lowest to highest pitch went from 700Hz  without compensation to 351Hz , and average pitch decr'd from 289 Hz to 167 Hz with compensations. SLP noted notable improvement in quality and presence of vocalization/phonatory signal when pt used compenstion of digital manipulation of thyroid and pulling up on chair. SLP will give pt laryngeal exercises next session.   PATIENT EDUCATION: Education details: See "Today's treatment" Person educated: Patient Education method: Explanation, Demonstration, Verbal cues, and Handouts Education comprehension: verbalized understanding, returned demonstration, verbal cues required, and needs further education  HOME EXERCISE PROGRAM: Laryngeal exercises and work with abdominal breathing (AB) to follow.  GOALS: Goals reviewed with patient? Yes  SHORT TERM GOALS: Target date: 07/16/22  Pt will perform laryngeal exercises with modified independence in 3 sessions Baseline: Goal status: [place]  2.  Pt will decr throat clearing to 3 or less/session in three sessions Baseline:  Goal status: [place]  3.  Pt will demonstrate vocal fold paralysis compensatory strategies in 2 minutes conversation in 3 sessions  Baseline:  Goal status: [place]og[place]  4.  Pt will maximize fluid intake between 3 sessions Baseline: 06/29/22 Goal status: [place]og[place]  5.  Pt will perform AB in sentence responses 80% success in 3 sessions Baseline:  Goal status: Onog[place]   LONG TERM GOALS: Target date: 08/13/22  Pt will perform laryngeal exercises with ndependence in 3 sessions Baseline:  Goal status: [place]  2.  Pt  will decr throat clearing to 2 or less/session in three sessions Baseline:  Goal status: [place]  3.  Pt will independently demonstrate vocal fold paralysis compensatory strategies in 4 minutes conversation in 3 sessions  Baseline:  Goal status: [place]og[place]  4.  Pt will perform AB in 8 minutes simple conversation, 80%, in 3 sessions Baseline:  Goal status: [place]  5.  Pt will improve PROM to higher score than at initial administration Baseline:  Goal status: Onoging   ASSESSMENT:  CLINICAL IMPRESSION: Patient is a 77 y.o. female who was seen today for hoarse voice, with diagnosis of rt unilateral vocal fold paralysis. PhoRTE initiated today. She had an improvement in vocal quality (pt stated from 5-6/10 to a 7/10) after digital manipulation of thyroid cartilage to lt, moving rt vocal fold more towards midline to align with lt vocal fold. Pt states her swallowing is at baseline after her esophageal dilation 06/04/22, however SLP told pt that unilateral vocal fold paralysis may impact swallow function. Pt may require MBSS/FEES during this therapy course.  OBJECTIVE IMPAIRMENTS: include voice disorder and dysphagia. These impairments are limiting patient from managing appointments, household responsibilities, ADLs/IADLs, effectively communicating at home and in community, and safety when swallowing. Factors affecting potential to achieve goals and functional outcome are previous level of function and severity of impairments.. Patient will benefit from skilled SLP services to address above impairments and improve overall function.  REHAB POTENTIAL: Excellent  PLAN:  SLP FREQUENCY: 1-2x/week  SLP DURATION: 8 weeks  PLANNED INTERVENTIONS: Environmental controls, Cueing hierachy, Internal/external aids, Functional tasks, SLP instruction and feedback, Compensatory strategies, Patient/family education, and voice exercises     Johnelle Tafolla, CCC-SLP 06/29/2022, 9:34 AM

## 2022-07-13 ENCOUNTER — Ambulatory Visit: Payer: Medicare HMO

## 2022-08-03 ENCOUNTER — Ambulatory Visit
Admission: RE | Admit: 2022-08-03 | Discharge: 2022-08-03 | Disposition: A | Discharge status: Home / Self Care | Payer: Medicare HMO | Source: Ambulatory Visit | Attending: Family Medicine | Admitting: Family Medicine

## 2022-08-03 DIAGNOSIS — Z1231 Encounter for screening mammogram for malignant neoplasm of breast: Secondary | ICD-10-CM | POA: Diagnosis not present

## 2022-08-10 ENCOUNTER — Telehealth: Payer: Self-pay | Admitting: Orthopaedic Surgery

## 2022-08-10 ENCOUNTER — Other Ambulatory Visit (HOSPITAL_BASED_OUTPATIENT_CLINIC_OR_DEPARTMENT_OTHER): Payer: Self-pay | Admitting: Orthopaedic Surgery

## 2022-08-10 DIAGNOSIS — S46012A Strain of muscle(s) and tendon(s) of the rotator cuff of left shoulder, initial encounter: Secondary | ICD-10-CM

## 2022-08-10 NOTE — Telephone Encounter (Signed)
Pt called requesting if Dr Steward Drone can sent referral for her to have physical therapy. Please call pt about this matter at [phone].

## 2022-08-13 DIAGNOSIS — R310 Gross hematuria: Secondary | ICD-10-CM | POA: Diagnosis not present

## 2022-08-13 DIAGNOSIS — R3915 Urgency of urination: Secondary | ICD-10-CM | POA: Diagnosis not present

## 2022-08-27 ENCOUNTER — Telehealth: Payer: Self-pay | Admitting: Orthopaedic Surgery

## 2022-08-27 NOTE — Telephone Encounter (Signed)
Patient called asked for a call back because she hasn't heard anything concerning (PT) Patient asked if it was approved? The number to contact patient is [phone]

## 2022-08-28 ENCOUNTER — Other Ambulatory Visit: Payer: Self-pay | Admitting: Family Medicine

## 2022-08-30 NOTE — Telephone Encounter (Signed)
I spoke with the patient and she reported that she does need refill at this time

## 2022-09-07 ENCOUNTER — Ambulatory Visit: Payer: Medicare HMO | Admitting: Nurse Practitioner

## 2022-09-09 DIAGNOSIS — Z961 Presence of intraocular lens: Secondary | ICD-10-CM | POA: Diagnosis not present

## 2022-09-09 DIAGNOSIS — D3130 Benign neoplasm of unspecified choroid: Secondary | ICD-10-CM | POA: Diagnosis not present

## 2022-09-09 DIAGNOSIS — H31012 Macula scars of posterior pole (postinflammatory) (post-traumatic), left eye: Secondary | ICD-10-CM | POA: Diagnosis not present

## 2022-09-09 DIAGNOSIS — H401131 Primary open-angle glaucoma, bilateral, mild stage: Secondary | ICD-10-CM | POA: Diagnosis not present

## 2022-09-09 DIAGNOSIS — Z9842 Cataract extraction status, left eye: Secondary | ICD-10-CM | POA: Diagnosis not present

## 2022-09-09 DIAGNOSIS — Z9841 Cataract extraction status, right eye: Secondary | ICD-10-CM | POA: Diagnosis not present

## 2022-09-09 DIAGNOSIS — H18593 Other hereditary corneal dystrophies, bilateral: Secondary | ICD-10-CM | POA: Diagnosis not present

## 2022-09-09 DIAGNOSIS — H59812 Chorioretinal scars after surgery for detachment, left eye: Secondary | ICD-10-CM | POA: Diagnosis not present

## 2022-09-09 DIAGNOSIS — H5053 Vertical heterophoria: Secondary | ICD-10-CM | POA: Diagnosis not present

## 2022-09-19 ENCOUNTER — Emergency Department (HOSPITAL_BASED_OUTPATIENT_CLINIC_OR_DEPARTMENT_OTHER)
Admission: EM | Admit: 2022-09-19 | Discharge: 2022-09-19 | Disposition: A | Discharge status: Home / Self Care | Payer: Medicare HMO | Attending: Emergency Medicine | Admitting: Emergency Medicine

## 2022-09-19 ENCOUNTER — Encounter (HOSPITAL_BASED_OUTPATIENT_CLINIC_OR_DEPARTMENT_OTHER): Payer: Self-pay

## 2022-09-19 ENCOUNTER — Emergency Department (HOSPITAL_BASED_OUTPATIENT_CLINIC_OR_DEPARTMENT_OTHER): Payer: Medicare HMO

## 2022-09-19 ENCOUNTER — Other Ambulatory Visit: Payer: Self-pay

## 2022-09-19 DIAGNOSIS — I10 Essential (primary) hypertension: Secondary | ICD-10-CM | POA: Insufficient documentation

## 2022-09-19 DIAGNOSIS — S46002A Unspecified injury of muscle(s) and tendon(s) of the rotator cuff of left shoulder, initial encounter: Secondary | ICD-10-CM | POA: Insufficient documentation

## 2022-09-19 DIAGNOSIS — X509XXA Other and unspecified overexertion or strenuous movements or postures, initial encounter: Secondary | ICD-10-CM | POA: Insufficient documentation

## 2022-09-19 DIAGNOSIS — Z79899 Other long term (current) drug therapy: Secondary | ICD-10-CM | POA: Diagnosis not present

## 2022-09-19 DIAGNOSIS — M25512 Pain in left shoulder: Secondary | ICD-10-CM | POA: Diagnosis not present

## 2022-09-19 NOTE — ED Provider Notes (Signed)
[place] EMERGENCY DEPARTMENT AT [facility] Provider Note   CSN: [id number] Arrival date & time: 09/19/22  1929     History  Chief Complaint  Patient presents with   Shoulder Pain    Heidi Shah is a 77 y.o. female.  Patient is a 77 year old female with a past medical history of left rotator cuff repair in September, hypertension presenting to the emergency department with shoulder pain.  She states that yesterday afternoon she was taking her dog out for a walk when he pulled on the leash and jerked her left arm.  She states that she has had increased pain in her left shoulder and difficulty ranging her shoulder since then.  She denies any numbness or weakness.  She states that she has been taking Tylenol for pain with some relief.  She states that she did not fall to the ground and denies any other injuries.  The history is provided by the patient.  Shoulder Pain      Home Medications Prior to Admission medications   Medication Sig Start Date End Date Taking? Authorizing Provider  amLODipine (NORVASC) 2.5 MG tablet Take 1 tablet (2.5 mg total) by mouth daily. 05/07/22   Burchette, Elberta Fortis, MD  Biotin w/ Vitamins C & E (HAIR SKIN & NAILS GUMMIES PO) Take 2 each by mouth daily.    [provider]  Calcium Carb-Cholecalciferol (CALCIUM 600+D3 PO) Take 1 tablet by mouth daily.    [provider]  Cholecalciferol 25 MCG (1000 UT) CHEW Chew 1,000 Units by mouth daily.    [provider]  cyanocobalamin (VITAMIN B12) 1000 MCG tablet Take 1,000 mcg by mouth daily.    [provider]  Ginkgo Biloba 60 MG CAPS Take 60 mg by mouth daily.    [provider]  lisinopril-hydrochlorothiazide (ZESTORETIC) 20-12.5 MG tablet Take 1 tablet by mouth daily. 05/07/22   Burchette, Elberta Fortis, MD  LORazepam (ATIVAN) 0.5 MG tablet TAKE ONE TABLET BY MOUTH ABOUT ONE HOUR PRIOR TO PROCEDURE 06/01/22   Burchette, Elberta Fortis, MD  Misc Natural Products  (ELDERBERRY IMMUNE COMPLEX PO) Take 1 each by mouth daily.    [provider]  Multiple Vitamin (MULTI-VITAMIN) tablet Take 1 tablet by mouth daily.    [provider]  omeprazole (PRILOSEC) 40 MG capsule Take 1 capsule (40 mg total) by mouth daily. 04/22/22   Hilarie Fredrickson, MD  Probiotic Product (PROBIOTIC DAILY PO) Take 1 capsule by mouth daily.    [provider]  vitamin C (ASCORBIC ACID) 500 MG tablet Take 500 mg by mouth daily.    [provider]  VYZULTA 0.024 % SOLN Place 1 drop into both eyes at bedtime. 12/01/19   [provider]      Allergies    Beeswax, Cetrimonium chloride [cetrimide], Methylisothiazolinone, Neomycin sulfate [neomycin], Nickel sulfate [nickel], Penicillins, and Propolis    Review of Systems   Review of Systems  Physical Exam Updated Vital Signs BP (!) 145/91 (BP Location: Right Arm)   Pulse 87   Temp 97.9 F (36.6 C) (Temporal)   Resp 18   SpO2 100%  Physical Exam Vitals and nursing note reviewed.  Constitutional:      General: She is not in acute distress.    Appearance: Normal appearance.  HENT:     Head: Normocephalic and atraumatic.     Nose: Nose normal.     Mouth/Throat:     Mouth: Mucous membranes are moist.  Eyes:  Extraocular Movements: Extraocular movements intact.  Cardiovascular:     Rate and Rhythm: Normal rate.     Pulses: Normal pulses.  Pulmonary:     Effort: Pulmonary effort is normal.  Abdominal:     General: Abdomen is flat.  Musculoskeletal:     Cervical back: Normal range of motion.     Comments: Limited shoulder flexion/extension/abduction actively in the left with tenderness to palp patient on left lateral shoulder, no obvious deformity, no tenderness to elbow or wrist, full elbow flexion/extension/supination/pronation  Skin:    General: Skin is warm and dry.  Neurological:     General: No focal deficit present.     Mental Status: She is alert and oriented to person,  place, and time.     Sensory: No sensory deficit.     Motor: No weakness.  Psychiatric:        Mood and Affect: Mood normal.        Behavior: Behavior normal.     ED Results / Procedures / Treatments   Labs (all labs ordered are listed, but only abnormal results are displayed) Labs Reviewed - No data to display  EKG None  Radiology DG Shoulder Left  Result Date: 09/19/2022 CLINICAL DATA:  Left shoulder pain for 36 hours EXAM: LEFT SHOULDER - 2+ VIEW COMPARISON:  Left shoulder MRI 11/06/2021 and radiographs 07/30/2021 FINDINGS: No acute fracture or dislocation. Superior subluxation of the humeral head new since 07/30/2021 and compatible with rotator cuff pathology. Degenerative arthritis AC and glenohumeral joints. IMPRESSION: 1. Superior subluxation of the humeral head compatible with rotator cuff tendinopathy/tear. No acute fracture. Electronically Signed   By: Minerva Fester M.D.   On: 09/19/2022 21:05    Procedures Procedures    Medications Ordered in ED Medications - No data to display  ED Course/ Medical Decision Making/ A&P Clinical Course as of 09/19/22 2156  Sun Sep 19, 2022  2111 XR concerning for rotator cuff tendonopathy vs tear. She will be recommended ice, NSAIDS and ROM exercises. She does have orthopedics follow up scheduled for Thursday. [VK]    Clinical Course User Index [VK] Rexford Maus, DO                             Medical Decision Making This patient presents to the ED with chief complaint(s) of L shoulder pain with pertinent past medical history of prior L rotator cuff repair, HTN which further complicates the presenting complaint. The complaint involves an extensive differential diagnosis and also carries with it a high risk of complications and morbidity.    The differential diagnosis includes fracture, dislocation, sprain, ligamentous injury, she is neurovascularly intact making a neurovascular injury unlikely  Additional history  obtained: Additional history obtained from N/A Records reviewed outpatient orthopedics records  ED Course and Reassessment: On patient's arrival to the emergency department she is hemodynamically stable in no acute distress.  Her arm is neurovascularly intact.  She does have tenderness to palpation of her lateral shoulder and will have x-rays performed to evaluate for fracture or dislocation.  She declined any pain medication at this time.  Independent labs interpretation:  N/A  Independent visualization of imaging: - I independently visualized the following imaging with scope of interpretation limited to determining acute life threatening conditions related to emergency care: L shoulder XR, which revealed no fracture or dislocation, evidence of possible subluxation and rotator cuff injury  Consultation: - Consulted or discussed management/test interpretation  w/ external professional: N/A  Consideration for admission or further workup: Patient has no emergent conditions requiring admission or further work-up at this time and is stable for discharge home with primary care follow-up  Social Determinants of health: N/A    Amount and/or Complexity of Data Reviewed Radiology: ordered.          Final Clinical Impression(s) / ED Diagnoses Final diagnoses:  Rotator cuff injury, left, initial encounter    Rx / DC Orders ED Discharge Orders     None         Rexford Maus, DO 09/19/22 2156

## 2022-09-19 NOTE — Discharge Instructions (Signed)
You were seen in the emergency department for your shoulder pain.  Your x-ray showed no broken or dislocated bones but did show signs concerning that you might have a another rotator cuff injury.  You can continue to take Tylenol and Motrin as needed for pain and ice your shoulder and should try to range it to prevent it from getting stiff, just limit heavy lifting or pulling things down with your left arm until you are cleared by your orthopedic doctor.  You should follow-up with them on Thursday as scheduled.  You should return to the emergency department if you develop numbness or weakness in your arm, your fingers turn blue or if you have any other new or concerning symptoms.

## 2022-09-19 NOTE — ED Triage Notes (Signed)
Patient here POV from Home.  Endorses Left Shoulder Pain for 36 Hours after she was walking her dog and the dog pulled on the leash. No Fall involved. Limited ROM.  NAD Noted during triage. A&Ox4. GCS 15. Ambulatory.

## 2022-09-19 NOTE — ED Notes (Signed)
RN provided AVS using Teachback Method. Patient verbalizes understanding of Discharge Instructions. Opportunity for Questioning and Answers were provided by RN. Patient Discharged from ED ambulatory to Home via Self.  

## 2022-09-20 DIAGNOSIS — R319 Hematuria, unspecified: Secondary | ICD-10-CM | POA: Diagnosis not present

## 2022-09-20 DIAGNOSIS — N2889 Other specified disorders of kidney and ureter: Secondary | ICD-10-CM | POA: Diagnosis not present

## 2022-09-20 DIAGNOSIS — R310 Gross hematuria: Secondary | ICD-10-CM | POA: Diagnosis not present

## 2022-09-23 ENCOUNTER — Ambulatory Visit (HOSPITAL_BASED_OUTPATIENT_CLINIC_OR_DEPARTMENT_OTHER): Payer: Medicare HMO | Admitting: Physical Therapy

## 2022-09-23 ENCOUNTER — Ambulatory Visit (HOSPITAL_BASED_OUTPATIENT_CLINIC_OR_DEPARTMENT_OTHER): Payer: Medicare HMO | Admitting: Orthopaedic Surgery

## 2022-09-23 ENCOUNTER — Other Ambulatory Visit (HOSPITAL_BASED_OUTPATIENT_CLINIC_OR_DEPARTMENT_OTHER): Payer: Self-pay

## 2022-09-23 DIAGNOSIS — S46012A Strain of muscle(s) and tendon(s) of the rotator cuff of left shoulder, initial encounter: Secondary | ICD-10-CM

## 2022-09-23 MED ORDER — LORAZEPAM 1 MG PO TABS: 1.0000 mg | ORAL_TABLET | Freq: Three times a day (TID) | ORAL | 0 refills | Status: DC

## 2022-09-23 NOTE — Progress Notes (Signed)
Post Operative Evaluation    Procedure/Date of Surgery: 12/10/21 left shoulder rotator cuff repair and biceps tenodesis  Interval History:   Presents today status post left rotator cuff.  Unfortunately she did have a recent episode where her dog pulled out the left leash and she feel a pop with subsequently decreased range of motion and overall strength.  She is here today for further assessment of this.  PMH/PSH/Family History/Social History/Meds/Allergies:    Past Medical History:  Diagnosis Date   Allergy    Anxiety    Arthritis    Cataract    surgery - Bilateral cataracts removed   Chicken pox    Colon polyps    Depression    Diverticulitis    Diverticulosis    Esophageal stricture    Family history of adverse reaction to anesthesia    difficult time waking mother up   GERD (gastroesophageal reflux disease)    Heart murmur    Hypertension    Osteoporosis    Past Surgical History:  Procedure Laterality Date   cataract surgery Bilateral 1996   COLONOSCOPY  2022   EYE SURGERY  2000   detached L retina   HERNIA REPAIR Bilateral 1990   inguinal   NECK SURGERY     as a child   POLYPECTOMY  2022   SHOULDER ARTHROSCOPY WITH ROTATOR CUFF REPAIR AND OPEN BICEPS TENODESIS Left 12/10/2021   Procedure: LEFT SHOULDER ARTHROSCOPY WITH ROTATOR CUFF REPAIR AND OPEN BICEPS TENODESIS;  Surgeon: Huel Cote, MD;  Location: [facility] OR;  Service: Orthopedics;  Laterality: Left;   UPPER GASTROINTESTINAL ENDOSCOPY  2022   Social History   Socioeconomic History   Marital status: Divorced    Spouse name: Not on file   Number of children: 2   Years of education: master's degree   Highest education level: Master's degree (e.g., MA, MS, MEng, MEd, MSW, MBA)  Occupational History   Occupation: retired Runner, broadcasting/film/video    Comment: part - time Runner, broadcasting/film/video  Tobacco Use   Smoking status: Never   Smokeless tobacco: Never  Vaping Use   Vaping Use: Never used   Substance and Sexual Activity   Alcohol use: Yes    Alcohol/week: 1.0 standard drink of alcohol    Types: 1 Glasses of wine per week    Comment: one or two glasses of wine a couple times a week   Drug use: No   Sexual activity: Not on file  Other Topics Concern   Not on file  Social History Narrative      Lives alone in ranch home.    2 children   Son lives in [place]   Continues to teach virtually part time   Social Determinants of Health   Financial Resource Strain: Low Risk  (05/28/2022)   Overall Financial Resource Strain (CARDIA)    Difficulty of Paying Living Expenses: Not hard at all  Food Insecurity: No Food Insecurity (05/28/2022)   Hunger Vital Sign    Worried About Running Out of Food in the Last Year: Never true    Ran Out of Food in the Last Year: Never true  Transportation Needs: No Transportation Needs (05/28/2022)   PRAPARE - Administrator, Civil Service (Medical): No    Lack of Transportation (Non-Medical): No  Physical Activity: Insufficiently Active (  05/28/2022)   Exercise Vital Sign    Days of Exercise per Week: 2 days    Minutes of Exercise per Session: 60 min  Stress: No Stress Concern Present (05/28/2022)   [employer] of Occupational Health - Occupational Stress Questionnaire    Feeling of Stress : Not at all  Social Connections: Moderately Integrated (05/28/2022)   Social Connection and Isolation Panel [NHANES]    Frequency of Communication with Friends and Family: More than three times a week    Frequency of Social Gatherings with Friends and Family: More than three times a week    Attends Religious Services: More than 4 times per year    Active Member of [employer] or Organizations: Yes    Attends Engineer, structural: More than 4 times per year    Marital Status: Divorced   Family History  Problem Relation Age of Onset   Heart disease Mother 79   COPD Mother    Crohn's disease Mother    Heart disease Father 65   Cancer  Father        kidney   Colon cancer Neg Hx    Esophageal cancer Neg Hx    Stomach cancer Neg Hx    Rectal cancer Neg Hx    Allergies  Allergen Reactions   Beeswax     Per skin test from dermatologist   Cetrimonium Chloride [Cetrimide]     Per skin test from dermatologist   Methylisothiazolinone     Per skin test from dermatologist   Neomycin Sulfate [Neomycin]     Per skin test from dermatologist   Nickel Sulfate [Nickel]     Per skin test from dermatologist   Penicillins     Childhood Reaction    Propolis     Per skin test from dermatologist   Current Outpatient Medications  Medication Sig Dispense Refill   amLODipine (NORVASC) 2.5 MG tablet Take 1 tablet (2.5 mg total) by mouth daily. 90 tablet 3   Biotin w/ Vitamins C & E (HAIR SKIN & NAILS GUMMIES PO) Take 2 each by mouth daily.     Calcium Carb-Cholecalciferol (CALCIUM 600+D3 PO) Take 1 tablet by mouth daily.     Cholecalciferol 25 MCG (1000 UT) CHEW Chew 1,000 Units by mouth daily.     cyanocobalamin (VITAMIN B12) 1000 MCG tablet Take 1,000 mcg by mouth daily.     Ginkgo Biloba 60 MG CAPS Take 60 mg by mouth daily.     lisinopril-hydrochlorothiazide (ZESTORETIC) 20-12.5 MG tablet Take 1 tablet by mouth daily. 90 tablet 3   LORazepam (ATIVAN) 0.5 MG tablet TAKE ONE TABLET BY MOUTH ABOUT ONE HOUR PRIOR TO PROCEDURE 2 tablet 0   Misc Natural Products (ELDERBERRY IMMUNE COMPLEX PO) Take 1 each by mouth daily.     Multiple Vitamin (MULTI-VITAMIN) tablet Take 1 tablet by mouth daily.     omeprazole (PRILOSEC) 40 MG capsule Take 1 capsule (40 mg total) by mouth daily. 90 capsule 3   Probiotic Product (PROBIOTIC DAILY PO) Take 1 capsule by mouth daily.     vitamin C (ASCORBIC ACID) 500 MG tablet Take 500 mg by mouth daily.     VYZULTA 0.024 % SOLN Place 1 drop into both eyes at bedtime.     No current facility-administered medications for this visit.   No results found.  Review of Systems:   A ROS was performed including  pertinent positives and negatives as documented in the HPI.   Musculoskeletal Exam:  There were no vitals taken for this visit.  Left shoulder incisions are healed.  Active forward elevation is to 80 degrees with external rotation at side to neutral.  Internal rotation is limited to side.  She has pain with positive drop arm test  Imaging:      I personally reviewed and interpreted the radiographs.   Assessment:   She is now status post left rotator cuff repair and biceps tenodesis unfortunately with significant pain and weakness with overhead activity after her dog jerked her left arm when going for a walk.  I discussed that given her exam today I would recommend an MRI to reassess the left shoulder as I do believe there would be concern for retear of the left shoulder particularly given her massive rotator cuff tear size.  Will plan to proceed with this.  I will see her back following discuss results Plan :    -Plan for MRI left shoulder and follow-up to discuss results      I personally saw and evaluated the patient, and participated in the management and treatment plan.  Huel Cote, MD Attending Physician, Orthopedic Surgery  This document was dictated using Dragon voice recognition software. A reasonable attempt at proof reading has been made to minimize errors.

## 2022-09-24 ENCOUNTER — Telehealth: Payer: Self-pay

## 2022-09-24 NOTE — Telephone Encounter (Signed)
Transition Care Management Follow-up Telephone Call Date of discharge and from where: [facility] 6/23 How have you been since you were released from the hospital? Doing great  Any questions or concerns? No  Items Reviewed: Did the pt receive and understand the discharge instructions provided? Yes  Medications obtained and verified? Yes  Other? No  Any new allergies since your discharge? No  Dietary orders reviewed? No Do you have support at home? No     Follow up appointments reviewed:  PCP Hospital f/u appt confirmed? Yes  Scheduled to see PCP on 6/27 @ . Specialist Hospital f/u appt confirmed? No  Scheduled to see  on  @ . Are transportation arrangements needed? No  If their condition worsens, is the pt aware to call PCP or go to the Emergency Dept.? Yes Was the patient provided with contact information for the PCP's office or ED? Yes Was to pt encouraged to call back with questions or concerns? Yes

## 2022-09-27 ENCOUNTER — Other Ambulatory Visit: Payer: Self-pay | Admitting: Family Medicine

## 2022-09-27 DIAGNOSIS — R490 Dysphonia: Secondary | ICD-10-CM | POA: Diagnosis not present

## 2022-09-27 DIAGNOSIS — J3801 Paralysis of vocal cords and larynx, unilateral: Secondary | ICD-10-CM | POA: Diagnosis not present

## 2022-10-03 ENCOUNTER — Ambulatory Visit
Admission: RE | Admit: 2022-10-03 | Discharge: 2022-10-03 | Disposition: A | Discharge status: Home / Self Care | Payer: Medicare HMO | Source: Ambulatory Visit | Attending: Orthopaedic Surgery | Admitting: Orthopaedic Surgery

## 2022-10-03 DIAGNOSIS — M25412 Effusion, left shoulder: Secondary | ICD-10-CM | POA: Diagnosis not present

## 2022-10-03 DIAGNOSIS — S46012A Strain of muscle(s) and tendon(s) of the rotator cuff of left shoulder, initial encounter: Secondary | ICD-10-CM

## 2022-10-03 DIAGNOSIS — M19012 Primary osteoarthritis, left shoulder: Secondary | ICD-10-CM | POA: Diagnosis not present

## 2022-10-03 DIAGNOSIS — M25512 Pain in left shoulder: Secondary | ICD-10-CM | POA: Diagnosis not present

## 2022-10-03 DIAGNOSIS — G8929 Other chronic pain: Secondary | ICD-10-CM | POA: Diagnosis not present

## 2022-10-06 ENCOUNTER — Other Ambulatory Visit: Payer: Self-pay

## 2022-10-06 ENCOUNTER — Encounter: Payer: Self-pay | Admitting: Family Medicine

## 2022-10-06 ENCOUNTER — Ambulatory Visit: Payer: Medicare HMO | Admitting: Family Medicine

## 2022-10-06 VITALS — BP 128/78 | HR 112 | Ht 59.0 in | Wt 117.0 lb

## 2022-10-06 DIAGNOSIS — G8929 Other chronic pain: Secondary | ICD-10-CM

## 2022-10-06 DIAGNOSIS — M25511 Pain in right shoulder: Secondary | ICD-10-CM | POA: Diagnosis not present

## 2022-10-06 NOTE — Progress Notes (Signed)
I, Stevenson Clinch, CMA acting as a scribe for Clementeen Graham, MD.  Heidi Shah is a 77 y.o. female who presents to [facility] today for L shoulder pain exacerbated on 09/18/22. She underwent a L RC repair in Sept. Pt was seen at the [facility] on June 23rd, reporting that the day prior, she was taking her dog for a walk, when he pulled on the leash and jerked her L arm. She had a f/u visit w/ her surgeon Dr. Steward Drone on 6/27, who ordered a MRI.  Today, pt reports new right shoulder pain. Sx present x 1-2 months. Thinks sx are related to overuse d/t left shoulder injury. Pain throughout the joint, worse with flexion/extension. Sx are localized. Denies past injury. Pt is RHD. Denies radiating pain. Notes left-sided neck pain. Denies n/t but endorses decreased grip strength. Using ice prn, taking IBU prn. Hx of bursitis and dx of arthritis.  Dx imaging: 10/03/22 L shoulder MRI  09/19/22 L shoulder XR  11/06/21 L shoulder MRI  Pertinent review of systems: No fevers or chills  Relevant historical information: Hypertension.  History of left shoulder rotator cuff repair.   Exam:  BP 128/78   Pulse (!) 112   Ht 4\' 11"  (1.499 m)   Wt 117 lb (53.1 kg)   SpO2 96%   BMI 23.63 kg/m  General: Well Developed, well nourished, and in no acute distress.   MSK: Right shoulder normal-appearing Normal motion pain with abduction.  Intact strength. Mildly positive Hawkins and Neer's test. Mildly positive empty can test. Mildly positive crossover arm compression test. Pulses cap refill and sensation are intact distally.    Lab and Radiology Results  Diagnostic Limited MSK Ultrasound of: Right shoulder Biceps tendon is enlarged with probable linear split tear.  No full-thickness tear is visible.  Hypoechoic fluid does surround the tendon within tendon sheath. Subscapularis tendon is visualized with a probable distal partial-thickness tear.  No severe retraction is  visible. Supraspinatus tendon does appear to be intact without full-thickness tear or significant retraction.  Mild subacromial bursitis is present. Infraspinatus tendon also appears to be intact without visible tear. AC joint effusion and degeneration is present. Impression: Subacromial bursitis.  Concern for partial tears of the proximal biceps tendon, subscapularis tendon.   No results found for this or any previous visit (from the past 72 hour(s)). MR Shoulder Left Wo Contrast  Result Date: 10/06/2022 CLINICAL DATA:  Shoulder pain, chronic, rotator cuff disorder suspected, xray done EXAM: MRI OF THE LEFT SHOULDER WITHOUT CONTRAST TECHNIQUE: Multiplanar, multisequence MR imaging of the shoulder was performed. No intravenous contrast was administered. COMPARISON:  X-ray 09/19/2022, MRI 11/06/2021 FINDINGS: Rotator cuff: Interval surgical changes from rotator cuff repair. Recurrent complete, full-thickness tear of the distal supraspinatus tendon with retraction to the glenohumeral joint. The subscapularis tendon is completely torn and retracted to the level of the glenohumeral joint, new from prior. Advanced infraspinatus tendinosis. Intact teres minor. Muscles: Supraspinatus and subscapularis muscle atrophy. Mild edema within the anterior deltoid muscle. Biceps long head: Not visualized. Correlate for history of tenotomy or tenodesis. Acromioclavicular Joint: Mild degenerative changes of the AC joint. Large volume subacromial-subdeltoid bursal fluid communicating with the glenohumeral joint. Glenohumeral Joint: Large glenohumeral joint effusion. High-riding humeral head closely approximating the undersurface of the acromion. Cartilage thinning along the superior aspect of the humeral head in the anterior aspect of the glenoid. Labrum:  Blunting of the superior labrum. Bones: No acute fracture. No dislocation. No bone  marrow edema. No marrow replacing bone lesion. Other: None. IMPRESSION: 1. Interval  surgical changes from rotator cuff repair. Complete, full-thickness, retracted tears of the supraspinatus and subscapularis tendons with associated muscle atrophy. 2. Nonvisualization of the long head of the biceps tendon. Correlate for history of tenotomy or tenodesis. 3. Mild glenohumeral and AC joint osteoarthritis. 4. Large glenohumeral joint effusion. Electronically Signed   By: Duanne Guess D.O.   On: 10/06/2022 08:06   I, Clementeen Graham, personally (independently) visualized and performed the interpretation of the images attached in this note.     Assessment and Plan: 77 y.o. female with right shoulder pain.  This occurs in the setting of a new left shoulder rotator cuff tear with retraction.  Her pain when she scheduled the appointment was worse.  Today the pain is lot better.  She is scheduled to see Dr. Steward Drone her shoulder surgeon on July 25.  We talked about steroid injection right side today.  We agreed to hold off on doing a steroid injection.  Dr. Steward Drone could do an injection if needed and I could do 1 sooner than that if needed too.  For now plan for home exercise program that she previously has learned with previous physical therapy.  Check back with her surgeon on the 25th.  Return sooner to me if needed.  PDMP not reviewed this encounter. Orders Placed This Encounter  Procedures   [place] LIMITED JOINT SPACE STRUCTURES UP RIGHT(NO LINKED CHARGES)    Order Specific Question:   Reason for Exam (SYMPTOM  OR DIAGNOSIS REQUIRED)    Answer:   right shoulder pain    Order Specific Question:   Preferred imaging location?    Answer:   [facility]   No orders of the defined types were placed in this encounter.    Discussed warning signs or symptoms. Please see discharge instructions. Patient expresses understanding.   The above documentation has been reviewed and is accurate and complete Clementeen Graham, M.D.

## 2022-10-07 DIAGNOSIS — R310 Gross hematuria: Secondary | ICD-10-CM | POA: Diagnosis not present

## 2022-10-07 DIAGNOSIS — R3915 Urgency of urination: Secondary | ICD-10-CM | POA: Diagnosis not present

## 2022-10-14 ENCOUNTER — Encounter (HOSPITAL_BASED_OUTPATIENT_CLINIC_OR_DEPARTMENT_OTHER): Payer: Self-pay | Admitting: Physical Therapy

## 2022-10-18 ENCOUNTER — Ambulatory Visit: Payer: Medicare HMO | Admitting: Physician Assistant

## 2022-10-18 ENCOUNTER — Encounter: Payer: Self-pay | Admitting: Physician Assistant

## 2022-10-18 VITALS — BP 110/70 | HR 99 | Ht 59.5 in | Wt 117.0 lb

## 2022-10-18 DIAGNOSIS — R194 Change in bowel habit: Secondary | ICD-10-CM

## 2022-10-18 DIAGNOSIS — K219 Gastro-esophageal reflux disease without esophagitis: Secondary | ICD-10-CM | POA: Diagnosis not present

## 2022-10-18 DIAGNOSIS — K222 Esophageal obstruction: Secondary | ICD-10-CM | POA: Diagnosis not present

## 2022-10-18 NOTE — Progress Notes (Signed)
Noted  

## 2022-10-18 NOTE — Progress Notes (Signed)
Chief Complaint: Reflux and bowel issues  HPI:    Heidi Shah is a 77 year old female with a past medical history as listed below including anxiety, diverticulitis and reflux, known to Dr. Marina Goodell, who was referred to me by Kristian Covey, MD for a complaint of reflux and bowel issues.      06/16/20 colonoscopy with diverticulosis in the left colon and otherwise norma, no repeat recommended.    06/04/2022 patient seen by Dr. Marina Goodell at that time was because she had a history of GERD complicated by peptic stricture, gastric ulcers as well as surveillance colonoscopies.  She was having recurrent intermittent solid food and occasional liquid dysphagia for which she been dilated in the past.  Continued on Omeprazole 40 daily.  Her Omeprazole was refilled 40 mg daily and scheduled for EGD with esophageal dilation.  Noted that her last colonoscopy was in 2022 for history of polyps and she had aged out of surveillance.    06/04/2022 EGD with Elease Hashimoto dilation to 54 [place].  Findings of 1 benign-appearing esophageal stenosis which was dilated.    Today, the patient presents to clinic and explains that her reflux is okay now but she was having an increase in issues.  It gradually just got better on its own although she does remain with some "throat clearing".  She asked me about the high dosage of Omeprazole she is on, 40 mg a day, and wonders if there is anything else she can use instead to treat her symptoms.  She has read about some of the side effects and is nervous, although tells me she has history of having EGD every other year for a stricture.    Also, discusses that her stools seem to radiate back-and-forth from loose stool and leakage to straining with constipation.  She also is seeing occasional blood in her stool but that only occurred about 2-3 times over the past year.  Apparently was previously on a fiber supplement but has not tried anything else over-the-counter to help.    Denies fever, chills or  weight loss.  Past Medical History:  Diagnosis Date   Allergy    Anxiety    Arthritis    Cataract    surgery - Bilateral cataracts removed   Chicken pox    Colon polyps    Depression    Diverticulitis    Diverticulosis    Esophageal stricture    Family history of adverse reaction to anesthesia    difficult time waking mother up   GERD (gastroesophageal reflux disease)    Heart murmur    Hypertension    Osteoporosis     Past Surgical History:  Procedure Laterality Date   cataract surgery Bilateral 1996   COLONOSCOPY  2022   EYE SURGERY  2000   detached L retina   HERNIA REPAIR Bilateral 1990   inguinal   NECK SURGERY     as a child   POLYPECTOMY  2022   SHOULDER ARTHROSCOPY WITH ROTATOR CUFF REPAIR AND OPEN BICEPS TENODESIS Left 12/10/2021   Procedure: LEFT SHOULDER ARTHROSCOPY WITH ROTATOR CUFF REPAIR AND OPEN BICEPS TENODESIS;  Surgeon: Huel Cote, MD;  Location: [facility] OR;  Service: Orthopedics;  Laterality: Left;   UPPER GASTROINTESTINAL ENDOSCOPY  2022    Current Outpatient Medications  Medication Sig Dispense Refill   amLODipine (NORVASC) 2.5 MG tablet Take 1 tablet (2.5 mg total) by mouth daily. 90 tablet 3   Biotin w/ Vitamins C & E (HAIR SKIN & NAILS GUMMIES  PO) Take 2 each by mouth daily.     Calcium Carb-Cholecalciferol (CALCIUM 600+D3 PO) Take 1 tablet by mouth daily.     cyanocobalamin (VITAMIN B12) 1000 MCG tablet Take 1,000 mcg by mouth daily.     Ginkgo Biloba 60 MG CAPS Take 60 mg by mouth daily.     lisinopril-hydrochlorothiazide (ZESTORETIC) 20-12.5 MG tablet Take 1 tablet by mouth daily. 90 tablet 3   Multiple Vitamin (MULTI-VITAMIN) tablet Take 1 tablet by mouth daily.     omeprazole (PRILOSEC) 40 MG capsule Take 1 capsule (40 mg total) by mouth daily. 90 capsule 3   vitamin C (ASCORBIC ACID) 500 MG tablet Take 500 mg by mouth daily.     VYZULTA 0.024 % SOLN Place 1 drop into both eyes at bedtime.     Probiotic Product (PROBIOTIC DAILY PO) Take 1  capsule by mouth daily. (Patient not taking: Reported on 10/18/2022)     No current facility-administered medications for this visit.    Allergies as of 10/18/2022 - Review Complete 10/18/2022  Allergen Reaction Noted   Beeswax  04/17/2018   Cetrimonium chloride [cetrimide]  04/17/2018   Methylisothiazolinone  04/17/2018   Neomycin sulfate [neomycin]  04/17/2018   Nickel sulfate [nickel]  04/17/2018   Penicillins  11/23/2012   Propolis  04/17/2018    Family History  Problem Relation Age of Onset   Heart disease Mother 60   COPD Mother    Crohn's disease Mother    Heart disease Father 71   Cancer Father        kidney   Colon cancer Neg Hx    Esophageal cancer Neg Hx    Stomach cancer Neg Hx    Rectal cancer Neg Hx     Social History   Socioeconomic History   Marital status: Divorced    Spouse name: Not on file   Number of children: 2   Years of education: master's degree   Highest education level: Master's degree (e.g., MA, MS, MEng, MEd, MSW, MBA)  Occupational History   Occupation: retired Runner, broadcasting/film/video  Tobacco Use   Smoking status: Never   Smokeless tobacco: Never  Vaping Use   Vaping status: Never Used  Substance and Sexual Activity   Alcohol use: Yes    Alcohol/week: 1.0 standard drink of alcohol    Types: 1 Glasses of wine per week    Comment: one or two glasses of wine a couple times a week   Drug use: No   Sexual activity: Not on file  Other Topics Concern   Not on file  Social History Narrative      Lives alone in ranch home.    2 children   Son lives in [place]   Continues to teach virtually part time   Social Determinants of Health   Financial Resource Strain: Low Risk  (05/28/2022)   Overall Financial Resource Strain (CARDIA)    Difficulty of Paying Living Expenses: Not hard at all  Food Insecurity: No Food Insecurity (05/28/2022)   Hunger Vital Sign    Worried About Running Out of Food in the Last Year: Never true    Ran Out of Food in the  Last Year: Never true  Transportation Needs: No Transportation Needs (05/28/2022)   PRAPARE - Administrator, Civil Service (Medical): No    Lack of Transportation (Non-Medical): No  Physical Activity: Insufficiently Active (05/28/2022)   Exercise Vital Sign    Days of Exercise per Week: 2 days  Minutes of Exercise per Session: 60 min  Stress: No Stress Concern Present (05/28/2022)   [employer] of Occupational Health - Occupational Stress Questionnaire    Feeling of Stress : Not at all  Social Connections: Moderately Integrated (05/28/2022)   Social Connection and Isolation Panel [NHANES]    Frequency of Communication with Friends and Family: More than three times a week    Frequency of Social Gatherings with Friends and Family: More than three times a week    Attends Religious Services: More than 4 times per year    Active Member of [employer] or Organizations: Yes    Attends Engineer, structural: More than 4 times per year    Marital Status: Divorced  Intimate Partner Violence: Not At Risk (05/28/2022)   Humiliation, Afraid, Rape, and Kick questionnaire    Fear of Current or Ex-Partner: No    Emotionally Abused: No    Physically Abused: No    Sexually Abused: No    Review of Systems:    Constitutional: No weight loss, fever or chills Cardiovascular: No chest pain Respiratory: No SOB  Gastrointestinal: See HPI and otherwise negative   Physical Exam:  Vital signs: BP 110/70   Pulse 99   Ht 4' 11.5" (1.511 m)   Wt 117 lb (53.1 kg)   BMI 23.24 kg/m    Constitutional:   Pleasant elderly Caucasian female appears to be in NAD, Well developed, Well nourished, alert and cooperative Respiratory: Respirations even and unlabored. Lungs clear to auscultation bilaterally.   No wheezes, crackles, or rhonchi.  Cardiovascular: Normal S1, S2. No MRG. Regular rate and rhythm. No peripheral edema, cyanosis or pallor.  Gastrointestinal:  Soft, nondistended, mild epigastric  ttp. No rebound or guarding. Normal bowel sounds. No appreciable masses or hepatomegaly. Rectal:  Not performed.  Psychiatric: Demonstrates good judgement and reason without abnormal affect or behaviors.  RELEVANT LABS AND IMAGING: CBC    Component Value Date/Time   WBC 5.3 03/01/2022 0837   RBC 4.38 03/01/2022 0837   HGB 13.7 03/01/2022 0837   HCT 40.2 03/01/2022 0837   PLT 344.0 03/01/2022 0837   MCV 91.8 03/01/2022 0837   MCH 31.0 12/04/2021 0845   MCHC 34.1 03/01/2022 0837   RDW 14.0 03/01/2022 0837   LYMPHSABS 1.8 03/01/2022 0837   MONOABS 0.6 03/01/2022 0837   EOSABS 0.2 03/01/2022 0837   BASOSABS 0.1 03/01/2022 0837    CMP     Component Value Date/Time   NA 137 03/01/2022 0837   K 4.0 03/01/2022 0837   CL 99 03/01/2022 0837   CO2 31 03/01/2022 0837   GLUCOSE 83 03/01/2022 0837   BUN 16 03/01/2022 0837   CREATININE 0.59 03/01/2022 0837   CREATININE 0.56 (L) 02/25/2020 0903   CALCIUM 9.4 03/01/2022 0837   PROT 6.8 03/01/2022 0837   ALBUMIN 4.4 03/01/2022 0837   AST 20 03/01/2022 0837   ALT 12 03/01/2022 0837   ALKPHOS 58 03/01/2022 0837   BILITOT 0.4 03/01/2022 0837   GFRNONAA >60 12/04/2021 0845   GFRAA  01/02/2010 0751    >60        The eGFR has been calculated using the MDRD equation. This calculation has not been validated in all clinical situations. eGFR's persistently <60 mL/min signify possible Chronic Kidney Disease.    Assessment: 1.  GERD with history of esophageal stenosis: Recent EGD in March with dilation of stricture, no further dysphagia and reflux controlled on Pantoprazole 40 mg daily, some questions regarding her  PPI going forward 2.  Change in bowel habits: Radiates back-and-forth from constipation to loose stool and even some times, as needed Imodium at the moment; likely diet/age/IBS  Plan: 1.  Discussed the use of a Benefiber supplement with the patient today.  Will start with 1 tablespoon daily and increase to 2 tablespoons a day.   This should help with the variance in her stool. 2.  Discussed that she should remain on a PPI due to history of peptic stricture.  She can try decreasing to 20 mg daily if she would like but if she has breakthrough reflux then she needs to stay on 40 mg.  We thoroughly discussed risks and benefits of this medication.  Answered all of her questions. 3.  Offered the patient a follow-up appointment but she would like to call if she is still having issues. 4.  Patient will follow-up as needed.  Hyacinth Meeker, PA-C  Gastroenterology 10/18/2022, 2:12 PM  Cc: Kristian Covey, MD

## 2022-10-18 NOTE — Patient Instructions (Signed)
Decrease Omeprazole to 20 mg daily 30-60 minutes before breakfast.  Benefiber 1-2 tablespoon daily in 8 ounces of liquid.  _______________________________________________________  If your blood pressure at your visit was 140/90 or greater, please contact your primary care physician to follow up on this.  _______________________________________________________  If you are age 75 or older, your body mass index should be between 23-30. Your Body mass index is 23.24 kg/m. If this is out of the aforementioned range listed, please consider follow up with your Primary Care Provider.  If you are age 72 or younger, your body mass index should be between 19-25. Your Body mass index is 23.24 kg/m. If this is out of the aformentioned range listed, please consider follow up with your Primary Care Provider.   ________________________________________________________  The [place] [facility] providers would like to encourage you to use [facility] to communicate with providers for non-urgent requests or questions.  Due to long hold times on the telephone, sending your provider a message by [facility] may be a faster and more efficient way to get a response.  Please allow 48 business hours for a response.  Please remember that this is for non-urgent requests.  _______________________________________________________

## 2022-10-21 ENCOUNTER — Ambulatory Visit (HOSPITAL_BASED_OUTPATIENT_CLINIC_OR_DEPARTMENT_OTHER): Payer: Medicare HMO | Admitting: Orthopaedic Surgery

## 2022-10-21 ENCOUNTER — Ambulatory Visit (HOSPITAL_BASED_OUTPATIENT_CLINIC_OR_DEPARTMENT_OTHER): Payer: Self-pay | Admitting: Orthopaedic Surgery

## 2022-10-21 ENCOUNTER — Encounter (HOSPITAL_BASED_OUTPATIENT_CLINIC_OR_DEPARTMENT_OTHER): Payer: Self-pay | Admitting: Orthopaedic Surgery

## 2022-10-21 ENCOUNTER — Other Ambulatory Visit (HOSPITAL_BASED_OUTPATIENT_CLINIC_OR_DEPARTMENT_OTHER): Payer: Self-pay

## 2022-10-21 DIAGNOSIS — S46012A Strain of muscle(s) and tendon(s) of the rotator cuff of left shoulder, initial encounter: Secondary | ICD-10-CM | POA: Diagnosis not present

## 2022-10-21 MED ORDER — ACETAMINOPHEN 500 MG PO TABS
500.0000 mg | ORAL_TABLET | Freq: Three times a day (TID) | ORAL | 0 refills | Status: AC
  Filled 2022-10-21: qty 30, 10d supply, fill #0

## 2022-10-21 MED ORDER — OXYCODONE HCL 5 MG PO TABS
5.0000 mg | ORAL_TABLET | ORAL | 0 refills | Status: DC | PRN
  Filled 2022-10-21: qty 10, 2d supply, fill #0

## 2022-10-21 MED ORDER — IBUPROFEN 800 MG PO TABS
800.0000 mg | ORAL_TABLET | Freq: Three times a day (TID) | ORAL | 0 refills | Status: AC
  Filled 2022-10-21: qty 30, 10d supply, fill #0

## 2022-10-21 MED ORDER — ASPIRIN 325 MG PO TBEC
325.0000 mg | DELAYED_RELEASE_TABLET | Freq: Every day | ORAL | 0 refills | Status: DC
  Filled 2022-10-21: qty 30, 30d supply, fill #0

## 2022-10-21 NOTE — Addendum Note (Signed)
Addended by: Kerby Less A on: 10/21/2022 09:14 AM   Modules accepted: Orders

## 2022-10-21 NOTE — Progress Notes (Signed)
Post Operative Evaluation    Procedure/Date of Surgery: 12/10/21 left shoulder rotator cuff repair and biceps tenodesis  Interval History:   10/21/2022: Presents today for follow-up of her left shoulder MRI.  Presents today status post left rotator cuff.  Unfortunately she did have a recent episode where her dog pulled out the left leash and she feel a pop with subsequently decreased range of motion and overall strength.  She is here today for further assessment of this.  PMH/PSH/Family History/Social History/Meds/Allergies:    Past Medical History:  Diagnosis Date   Allergy    Anxiety    Arthritis    Cataract    surgery - Bilateral cataracts removed   Chicken pox    Colon polyps    Depression    Diverticulitis    Diverticulosis    Esophageal stricture    Family history of adverse reaction to anesthesia    difficult time waking mother up   GERD (gastroesophageal reflux disease)    Heart murmur    Hypertension    Osteoporosis    Past Surgical History:  Procedure Laterality Date   cataract surgery Bilateral 1996   COLONOSCOPY  2022   EYE SURGERY  2000   detached L retina   HERNIA REPAIR Bilateral 1990   inguinal   NECK SURGERY     as a child   POLYPECTOMY  2022   SHOULDER ARTHROSCOPY WITH ROTATOR CUFF REPAIR AND OPEN BICEPS TENODESIS Left 12/10/2021   Procedure: LEFT SHOULDER ARTHROSCOPY WITH ROTATOR CUFF REPAIR AND OPEN BICEPS TENODESIS;  Surgeon: Huel Cote, MD;  Location: [facility] OR;  Service: Orthopedics;  Laterality: Left;   UPPER GASTROINTESTINAL ENDOSCOPY  2022   Social History   Socioeconomic History   Marital status: Divorced    Spouse name: Not on file   Number of children: 2   Years of education: master's degree   Highest education level: Master's degree (e.g., MA, MS, MEng, MEd, MSW, MBA)  Occupational History   Occupation: retired Runner, broadcasting/film/video  Tobacco Use   Smoking status: Never   Smokeless tobacco: Never  Vaping  Use   Vaping status: Never Used  Substance and Sexual Activity   Alcohol use: Yes    Alcohol/week: 1.0 standard drink of alcohol    Types: 1 Glasses of wine per week    Comment: one or two glasses of wine a couple times a week   Drug use: No   Sexual activity: Not on file  Other Topics Concern   Not on file  Social History Narrative      Lives alone in ranch home.    2 children   Son lives in [place]   Continues to teach virtually part time   Social Determinants of Health   Financial Resource Strain: Low Risk  (05/28/2022)   Overall Financial Resource Strain (CARDIA)    Difficulty of Paying Living Expenses: Not hard at all  Food Insecurity: No Food Insecurity (05/28/2022)   Hunger Vital Sign    Worried About Running Out of Food in the Last Year: Never true    Ran Out of Food in the Last Year: Never true  Transportation Needs: No Transportation Needs (05/28/2022)   PRAPARE - Administrator, Civil Service (Medical): No    Lack of Transportation (Non-Medical): No  Physical  Activity: Insufficiently Active (05/28/2022)   Exercise Vital Sign    Days of Exercise per Week: 2 days    Minutes of Exercise per Session: 60 min  Stress: No Stress Concern Present (05/28/2022)   [employer] of Occupational Health - Occupational Stress Questionnaire    Feeling of Stress : Not at all  Social Connections: Moderately Integrated (05/28/2022)   Social Connection and Isolation Panel [NHANES]    Frequency of Communication with Friends and Family: More than three times a week    Frequency of Social Gatherings with Friends and Family: More than three times a week    Attends Religious Services: More than 4 times per year    Active Member of [employer] or Organizations: Yes    Attends Engineer, structural: More than 4 times per year    Marital Status: Divorced   Family History  Problem Relation Age of Onset   Heart disease Mother 37   COPD Mother    Crohn's disease Mother    Heart  disease Father 68   Cancer Father        kidney   Colon cancer Neg Hx    Esophageal cancer Neg Hx    Stomach cancer Neg Hx    Rectal cancer Neg Hx    Allergies  Allergen Reactions   Beeswax     Per skin test from dermatologist   Cetrimonium Chloride [Cetrimide]     Per skin test from dermatologist   Methylisothiazolinone     Per skin test from dermatologist   Neomycin Sulfate [Neomycin]     Per skin test from dermatologist   Nickel Sulfate [Nickel]     Per skin test from dermatologist   Penicillins     Childhood Reaction    Propolis     Per skin test from dermatologist   Current Outpatient Medications  Medication Sig Dispense Refill   amLODipine (NORVASC) 2.5 MG tablet Take 1 tablet (2.5 mg total) by mouth daily. 90 tablet 3   Biotin w/ Vitamins C & E (HAIR SKIN & NAILS GUMMIES PO) Take 2 each by mouth daily.     Calcium Carb-Cholecalciferol (CALCIUM 600+D3 PO) Take 1 tablet by mouth daily.     cyanocobalamin (VITAMIN B12) 1000 MCG tablet Take 1,000 mcg by mouth daily.     Ginkgo Biloba 60 MG CAPS Take 60 mg by mouth daily.     lisinopril-hydrochlorothiazide (ZESTORETIC) 20-12.5 MG tablet Take 1 tablet by mouth daily. 90 tablet 3   Multiple Vitamin (MULTI-VITAMIN) tablet Take 1 tablet by mouth daily.     omeprazole (PRILOSEC) 40 MG capsule Take 1 capsule (40 mg total) by mouth daily. 90 capsule 3   Probiotic Product (PROBIOTIC DAILY PO) Take 1 capsule by mouth daily. (Patient not taking: Reported on 10/18/2022)     vitamin C (ASCORBIC ACID) 500 MG tablet Take 500 mg by mouth daily.     VYZULTA 0.024 % SOLN Place 1 drop into both eyes at bedtime.     No current facility-administered medications for this visit.   No results found.  Review of Systems:   A ROS was performed including pertinent positives and negatives as documented in the HPI.   Musculoskeletal Exam:    There were no vitals taken for this visit.  Left shoulder incisions are healed.  Active forward  elevation is to 30 degrees with external rotation at side to neutral.  Internal rotation is limited to side.  She has pain with positive drop arm  test  Imaging:     MRI left shoulder: There is a full-thickness read tear with retraction of the supraspinatus at the musculotendinous junction of the left shoulder  I personally reviewed and interpreted the radiographs.   Assessment:   She is now status post left rotator cuff repair and biceps tenodesis unfortunately with significant pain and weakness with overhead activity after her dog jerked her left arm when going for a walk.  She presents today with a full-thickness tear at the musculotendinous junction of her repair.  Unfortunately given the fact that there is significant involvement and retraction I do believe that a redo repair would be unlikely to provide a significant relief and recovery.  At this time we discussed treatment options.  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  Given that we did discuss additional treatment options.  Specifically I do believe that reverse shoulder arthroplasty would give her the longest term outcome relief.  At this time she would like to proceed with this Plan :    -Plan for left shoulder reverse shoulder arthroplasty   After a lengthy discussion of treatment options, including risks, benefits, alternatives, complications of surgical and nonsurgical conservative options, the patient elected surgical repair.   The patient  is aware of the material risks  and complications including, but not limited to injury to adjacent structures, neurovascular injury, infection, numbness, bleeding, implant failure, thermal burns, stiffness, persistent pain, failure to heal, disease transmission from allograft, need for further surgery, dislocation, anesthetic risks, blood clots, risks of death,and others. The probabilities of surgical success and failure discussed with patient given their particular co-morbidities.The time and  nature of expected rehabilitation and recovery was discussed.The patient's questions were all answered preoperatively.  No barriers to understanding were noted. I explained the natural history of the disease process and Rx rationale.  I explained to the patient what I considered to be reasonable expectations given their personal situation.  The final treatment plan was arrived at through a shared patient decision making process model.       I personally saw and evaluated the patient, and participated in the management and treatment plan.  Huel Cote, MD Attending Physician, Orthopedic Surgery  This document was dictated using Dragon voice recognition software. A reasonable attempt at proof reading has been made to minimize errors.

## 2022-10-31 ENCOUNTER — Ambulatory Visit (HOSPITAL_BASED_OUTPATIENT_CLINIC_OR_DEPARTMENT_OTHER)
Admission: RE | Admit: 2022-10-31 | Discharge: 2022-10-31 | Disposition: A | Discharge status: Home / Self Care | Payer: Medicare HMO | Source: Ambulatory Visit | Attending: Orthopaedic Surgery | Admitting: Orthopaedic Surgery

## 2022-10-31 DIAGNOSIS — M19012 Primary osteoarthritis, left shoulder: Secondary | ICD-10-CM | POA: Diagnosis not present

## 2022-10-31 DIAGNOSIS — Z01818 Encounter for other preprocedural examination: Secondary | ICD-10-CM | POA: Diagnosis not present

## 2022-10-31 DIAGNOSIS — S46012A Strain of muscle(s) and tendon(s) of the rotator cuff of left shoulder, initial encounter: Secondary | ICD-10-CM | POA: Insufficient documentation

## 2022-10-31 DIAGNOSIS — M25412 Effusion, left shoulder: Secondary | ICD-10-CM | POA: Diagnosis not present

## 2022-10-31 DIAGNOSIS — M75122 Complete rotator cuff tear or rupture of left shoulder, not specified as traumatic: Secondary | ICD-10-CM | POA: Diagnosis not present

## 2022-11-05 NOTE — Therapy (Signed)
[facility] [address] [place], [place], [address] Phone: [phone]   Fax:  [phone]  Patient Details  Name: Heidi Shah MRN: [id number] Date of Birth: Mar 12, 1946 Referring Provider:  No ref. provider found  Encounter Date: 11/05/2022 SPEECH THERAPY DISCHARGE SUMMARY  Visits from Start of Care: 4  Current functional level related to goals / functional outcomes: With dx of rt vocal fold paralysis SLP initiated PhoRTE, a phonatory resistance exercise regimen to hopefully narrow/close her glottal gap when phonating. Pt was making good (not excellent) progress when her visits ran out in April 2024 and she did not schedule further ST. It is assumed she no longer desires ST.  Goals and impression from her last scheduled/attended session on 06/29/22 are below:   SHORT TERM GOALS: Target date: 07/16/22   Pt will perform laryngeal exercises with modified independence in 3 sessions Baseline: Goal status: [place]   2.  Pt will decr throat clearing to 3 or less/session in three sessions Baseline:  Goal status: [place]   3.  Pt will demonstrate vocal fold paralysis compensatory strategies in 2 minutes conversation in 3 sessions  Baseline:  Goal status: [place]   4.  Pt will maximize fluid intake between 3 sessions Baseline: 06/29/22 Goal status: [place]   5.  Pt will perform AB in sentence responses 80% success in 3 sessions Baseline:  Goal status: [place]     LONG TERM GOALS: Target date: 08/13/22   Pt will perform laryngeal exercises with ndependence in 3 sessions Baseline:  Goal status: [place]   2.  Pt will decr throat clearing to 2 or less/session in three sessions Baseline:  Goal status: [place]   3.  Pt will independently demonstrate vocal fold paralysis compensatory strategies in 4 minutes conversation in 3 sessions  Baseline:  Goal status: [place]   4.  Pt will perform AB in 8 minutes simple  conversation, 80%, in 3 sessions Baseline:  Goal status: [place]   5.  Pt will improve PROM to higher score than at initial administration Baseline:  Goal status: [place]   ASSESSMENT:   CLINICAL IMPRESSION: Patient is a 77 y.o. female who was seen today for hoarse voice, with diagnosis of rt unilateral vocal fold paralysis. PhoRTE initiated today. She had an improvement in vocal quality (pt stated from 5-6/10 to a 7/10) after digital manipulation of thyroid cartilage to lt, moving rt vocal fold more towards midline to align with lt vocal fold. Pt states her swallowing is at baseline after her esophageal dilation 06/04/22, however SLP told pt that unilateral vocal fold paralysis may impact swallow function. Pt may require MBSS/FEES during this therapy course.  Remaining deficits: Unknown, as this is an unexpected d/c.    Education / Equipment: See therapy notes.   Patient agrees to discharge. Patient goals were not met. Patient is being discharged due to not returning since the last visit.Marland Kitchen     ,, CCC-SLP 11/05/2022, 9:12 AM  [place]  [facility] [address] [place], [place], [address] Phone: [phone]   Fax:  [phone]

## 2022-11-08 DIAGNOSIS — L0109 Other impetigo: Secondary | ICD-10-CM | POA: Diagnosis not present

## 2022-11-08 DIAGNOSIS — L821 Other seborrheic keratosis: Secondary | ICD-10-CM | POA: Diagnosis not present

## 2022-11-08 DIAGNOSIS — L814 Other melanin hyperpigmentation: Secondary | ICD-10-CM | POA: Diagnosis not present

## 2022-12-01 ENCOUNTER — Encounter (HOSPITAL_BASED_OUTPATIENT_CLINIC_OR_DEPARTMENT_OTHER): Payer: Self-pay | Admitting: Orthopaedic Surgery

## 2022-12-01 ENCOUNTER — Ambulatory Visit (HOSPITAL_BASED_OUTPATIENT_CLINIC_OR_DEPARTMENT_OTHER): Payer: Medicare HMO | Admitting: Orthopaedic Surgery

## 2022-12-01 DIAGNOSIS — S46012A Strain of muscle(s) and tendon(s) of the rotator cuff of left shoulder, initial encounter: Secondary | ICD-10-CM | POA: Diagnosis not present

## 2022-12-01 DIAGNOSIS — M7541 Impingement syndrome of right shoulder: Secondary | ICD-10-CM | POA: Diagnosis not present

## 2022-12-01 MED ORDER — TRIAMCINOLONE ACETONIDE 40 MG/ML IJ SUSP
80.0000 mg | INTRAMUSCULAR | Status: AC | PRN
Start: 2022-12-01 — End: 2022-12-01
  Administered 2022-12-01: 80 mg via INTRA_ARTICULAR

## 2022-12-01 MED ORDER — LIDOCAINE HCL 1 % IJ SOLN
4.0000 mL | INTRAMUSCULAR | Status: AC | PRN
Start: 2022-12-01 — End: 2022-12-01
  Administered 2022-12-01: 4 mL

## 2022-12-01 NOTE — Progress Notes (Signed)
Post Operative Evaluation    Procedure/Date of Surgery: 12/10/21 left shoulder rotator cuff repair and biceps tenodesis  Interval History:   12/01/2022: Presents today for further discussion of the left shoulder as well as her right shoulder.  She is having some tenderness about the right shoulder and is hoping to have an ultrasound-guided injection on the side.  Presents today status post left rotator cuff.  Unfortunately she did have a recent episode where her dog pulled out the left leash and she feel a pop with subsequently decreased range of motion and overall strength.  She is here today for further assessment of this.  PMH/PSH/Family History/Social History/Meds/Allergies:    Past Medical History:  Diagnosis Date  . Allergy   . Anxiety   . Arthritis   . Cataract    surgery - Bilateral cataracts removed  . Chicken pox   . Colon polyps   . Depression   . Diverticulitis   . Diverticulosis   . Esophageal stricture   . Family history of adverse reaction to anesthesia    difficult time waking mother up  . GERD (gastroesophageal reflux disease)   . Heart murmur   . Hypertension   . Osteoporosis    Past Surgical History:  Procedure Laterality Date  . cataract surgery Bilateral 1996  . COLONOSCOPY  2022  . EYE SURGERY  2000   detached L retina  . HERNIA REPAIR Bilateral 1990   inguinal  . NECK SURGERY     as a child  . POLYPECTOMY  2022  . SHOULDER ARTHROSCOPY WITH ROTATOR CUFF REPAIR AND OPEN BICEPS TENODESIS Left 12/10/2021   Procedure: LEFT SHOULDER ARTHROSCOPY WITH ROTATOR CUFF REPAIR AND OPEN BICEPS TENODESIS;  Surgeon: Huel Cote, MD;  Location: [facility] OR;  Service: Orthopedics;  Laterality: Left;  . UPPER GASTROINTESTINAL ENDOSCOPY  2022   Social History   Socioeconomic History  . Marital status: Divorced    Spouse name: Not on file  . Number of children: 2  . Years of education: master's degree  . Highest education level:  Master's degree (e.g., MA, MS, MEng, MEd, MSW, MBA)  Occupational History  . Occupation: retired Runner, broadcasting/film/video  Tobacco Use  . Smoking status: Never  . Smokeless tobacco: Never  Vaping Use  . Vaping status: Never Used  Substance and Sexual Activity  . Alcohol use: Yes    Alcohol/week: 1.0 standard drink of alcohol    Types: 1 Glasses of wine per week    Comment: one or two glasses of wine a couple times a week  . Drug use: No  . Sexual activity: Not on file  Other Topics Concern  . Not on file  Social History Narrative      Lives alone in ranch home.    2 children   Son lives in [place]   Continues to teach virtually part time   Social Determinants of Health   Financial Resource Strain: Low Risk  (05/28/2022)   Overall Financial Resource Strain (CARDIA)   . Difficulty of Paying Living Expenses: Not hard at all  Food Insecurity: No Food Insecurity (05/28/2022)   Hunger Vital Sign   . Worried About Programme researcher, broadcasting/film/video in the Last Year: Never true   . Ran Out of Food in the Last Year: Never true  Transportation Needs:  No Transportation Needs (05/28/2022)   PRAPARE - Transportation   . Lack of Transportation (Medical): No   . Lack of Transportation (Non-Medical): No  Physical Activity: Insufficiently Active (05/28/2022)   Exercise Vital Sign   . Days of Exercise per Week: 2 days   . Minutes of Exercise per Session: 60 min  Stress: No Stress Concern Present (05/28/2022)   [employer] of Occupational Health - Occupational Stress Questionnaire   . Feeling of Stress : Not at all  Social Connections: Moderately Integrated (05/28/2022)   Social Connection and Isolation Panel [NHANES]   . Frequency of Communication with Friends and Family: More than three times a week   . Frequency of Social Gatherings with Friends and Family: More than three times a week   . Attends Religious Services: More than 4 times per year   . Active Member of Clubs or Organizations: Yes   . Attends Tax inspector Meetings: More than 4 times per year   . Marital Status: Divorced   Family History  Problem Relation Age of Onset  . Heart disease Mother 42  . COPD Mother   . Crohn's disease Mother   . Heart disease Father 12  . Cancer Father        kidney  . Colon cancer Neg Hx   . Esophageal cancer Neg Hx   . Stomach cancer Neg Hx   . Rectal cancer Neg Hx    Allergies  Allergen Reactions  . Beeswax     Per skin test from dermatologist  . Cetrimonium Chloride [Cetrimide]     Per skin test from dermatologist  . Methylisothiazolinone     Per skin test from dermatologist  . Neomycin Sulfate [Neomycin]     Per skin test from dermatologist  . Nickel Sulfate [Nickel]     Per skin test from dermatologist  . Penicillins     Childhood Reaction   . Propolis     Per skin test from dermatologist   Current Outpatient Medications  Medication Sig Dispense Refill  . amLODipine (NORVASC) 2.5 MG tablet Take 1 tablet (2.5 mg total) by mouth daily. 90 tablet 3  . aspirin EC 325 MG tablet Take 1 tablet (325 mg total) by mouth daily. 30 tablet 0  . Biotin w/ Vitamins C & E (HAIR SKIN & NAILS GUMMIES PO) Take 2 each by mouth daily.    . Calcium Carb-Cholecalciferol (CALCIUM 600+D3 PO) Take 1 tablet by mouth daily.    . cyanocobalamin (VITAMIN B12) 1000 MCG tablet Take 1,000 mcg by mouth daily.    . Ginkgo Biloba 60 MG CAPS Take 60 mg by mouth daily.    Marland Kitchen lisinopril-hydrochlorothiazide (ZESTORETIC) 20-12.5 MG tablet Take 1 tablet by mouth daily. 90 tablet 3  . Multiple Vitamin (MULTI-VITAMIN) tablet Take 1 tablet by mouth daily.    Marland Kitchen omeprazole (PRILOSEC) 40 MG capsule Take 1 capsule (40 mg total) by mouth daily. 90 capsule 3  . oxyCODONE (ROXICODONE) 5 MG immediate release tablet Take 1 tablet (5 mg total) by mouth every 4 (four) hours as needed for severe pain or breakthrough pain. 10 tablet 0  . Probiotic Product (PROBIOTIC DAILY PO) Take 1 capsule by mouth daily. (Patient not taking: Reported  on 10/18/2022)    . vitamin C (ASCORBIC ACID) 500 MG tablet Take 500 mg by mouth daily.    Marland Kitchen VYZULTA 0.024 % SOLN Place 1 drop into both eyes at bedtime.     No current facility-administered medications for  this visit.   No results found.  Review of Systems:   A ROS was performed including pertinent positives and negatives as documented in the HPI.   Musculoskeletal Exam:    There were no vitals taken for this visit.  Left shoulder incisions are healed.  Active forward elevation is to 30 degrees with external rotation at side to neutral.  Internal rotation is limited to side.  She has pain with positive drop arm test  Right shoulder active forward elevation is to 170 degrees.  External rotation at side is to 50 degrees internal rotation is to T12.  Positive Neer impingement  Imaging:     MRI left shoulder: There is a full-thickness read tear with retraction of the supraspinatus at the musculotendinous junction of the left shoulder  I personally reviewed and interpreted the radiographs.   Assessment:   She is now status post left rotator cuff repair and biceps tenodesis unfortunately with significant pain and weakness with overhead activity after her dog jerked her left arm when going for a walk.  She presents today with a full-thickness tear at the musculotendinous junction of her repair.  Unfortunately given the fact that there is significant involvement and retraction I do believe that a redo repair would be unlikely to provide a significant relief and recovery.  At this time we discussed treatment options.  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  Given that we did discuss additional treatment options.  Specifically I do believe that reverse shoulder arthroplasty would give her the longest term outcome relief.  At this time she would like to proceed with this.  She is also requesting a right shoulder ultrasound-guided injection and this was performed in the subacromial space today  after verbal consent was obtained Plan :    -Plan for left shoulder reverse shoulder arthroplasty  Right shoulder ultrasound-guided subacromial injection after verbal consent obtained    Procedure Note  Patient: Heidi Shah             Date of Birth: 05-26-1945           MRN: [id number]             Visit Date: 12/01/2022  Procedures: Visit Diagnoses: No diagnosis found.  Large Joint Inj: R subacromial bursa on 12/01/2022 3:14 PM Indications: pain Details: 22 G 1.5 in needle, ultrasound-guided anterior approach  Arthrogram: No  Medications: 4 mL lidocaine 1 %; 80 mg triamcinolone acetonide 40 MG/ML Outcome: tolerated well, no immediate complications Procedure, treatment alternatives, risks and benefits explained, specific risks discussed. Consent was given by the patient. Immediately prior to procedure a time out was called to verify the correct patient, procedure, equipment, support staff and site/side marked as required. Patient was prepped and draped in the usual sterile fashion.        After a lengthy discussion of treatment options, including risks, benefits, alternatives, complications of surgical and nonsurgical conservative options, the patient elected surgical repair.   The patient  is aware of the material risks  and complications including, but not limited to injury to adjacent structures, neurovascular injury, infection, numbness, bleeding, implant failure, thermal burns, stiffness, persistent pain, failure to heal, disease transmission from allograft, need for further surgery, dislocation, anesthetic risks, blood clots, risks of death,and others. The probabilities of surgical success and failure discussed with patient given their particular co-morbidities.The time and nature of expected rehabilitation and recovery was discussed.The patient's questions were all answered preoperatively.  No barriers to understanding were noted. I explained the  natural history of the  disease process and Rx rationale.  I explained to the patient what I considered to be reasonable expectations given their personal situation.  The final treatment plan was arrived at through a shared patient decision making process model.       I personally saw and evaluated the patient, and participated in the management and treatment plan.  Huel Cote, MD Attending Physician, Orthopedic Surgery  This document was dictated using Dragon voice recognition software. A reasonable attempt at proof reading has been made to minimize errors.

## 2022-12-06 ENCOUNTER — Ambulatory Visit (INDEPENDENT_AMBULATORY_CARE_PROVIDER_SITE_OTHER): Payer: Medicare HMO | Admitting: Family Medicine

## 2022-12-06 ENCOUNTER — Encounter: Payer: Self-pay | Admitting: Family Medicine

## 2022-12-06 VITALS — BP 140/80 | HR 85 | Temp 98.2°F | Ht 59.5 in | Wt 119.0 lb

## 2022-12-06 DIAGNOSIS — I10 Essential (primary) hypertension: Secondary | ICD-10-CM | POA: Diagnosis not present

## 2022-12-06 DIAGNOSIS — I358 Other nonrheumatic aortic valve disorders: Secondary | ICD-10-CM | POA: Diagnosis not present

## 2022-12-06 DIAGNOSIS — Z01818 Encounter for other preprocedural examination: Secondary | ICD-10-CM

## 2022-12-06 LAB — CBC WITH DIFFERENTIAL/PLATELET
Basophils Absolute: 0.1 10*3/uL (ref 0.0–0.1)
Basophils Relative: 0.9 % (ref 0.0–3.0)
Eosinophils Absolute: 0.2 10*3/uL (ref 0.0–0.7)
Eosinophils Relative: 2.2 % (ref 0.0–5.0)
HCT: 42.3 % (ref 36.0–46.0)
Hemoglobin: 13.8 g/dL (ref 12.0–15.0)
Lymphocytes Relative: 38.8 % (ref 12.0–46.0)
Lymphs Abs: 2.7 10*3/uL (ref 0.7–4.0)
MCHC: 32.5 g/dL (ref 30.0–36.0)
MCV: 92.4 fl (ref 78.0–100.0)
Monocytes Absolute: 0.7 10*3/uL (ref 0.1–1.0)
Monocytes Relative: 10.2 % (ref 3.0–12.0)
Neutro Abs: 3.3 10*3/uL (ref 1.4–7.7)
Neutrophils Relative %: 47.9 % (ref 43.0–77.0)
Platelets: 333 10*3/uL (ref 150.0–400.0)
RBC: 4.58 Mil/uL (ref 3.87–5.11)
RDW: 12.9 % (ref 11.5–15.5)
WBC: 6.8 10*3/uL (ref 4.0–10.5)

## 2022-12-06 LAB — BASIC METABOLIC PANEL
BUN: 16 mg/dL (ref 6–23)
CO2: 29 meq/L (ref 19–32)
Calcium: 9.9 mg/dL (ref 8.4–10.5)
Chloride: 99 meq/L (ref 96–112)
Creatinine, Ser: 0.58 mg/dL (ref 0.40–1.20)
GFR: 88.89 mL/min (ref >60.00)
Glucose, Bld: 79 mg/dL (ref 70–99)
Potassium: 3.8 meq/L (ref 3.5–5.1)
Sodium: 136 meq/L (ref 135–145)

## 2022-12-06 NOTE — Progress Notes (Signed)
Established Patient Office Visit  Subjective   Patient ID: Heidi Shah, female    DOB: [birth year]  Age: 77 y.o. MRN: [id number]  No chief complaint on file.   HPI   Heidi Shah is seen for preoperative clearance for upcoming proposed surgery September 19 for left reverse shoulder arthroplasty.  She had history of rotator cuff repair previously and unfortunately had an injury where she was pulled by dog on a leash and reinjured the shoulder with complete full-thickness retracted tears of the supraspinatus and subscapularis tendons.  She does not have any cardiac history.  She does have a history of aortic murmur and had echocardiogram 2022 with normal EF and no significant aortic valve stenosis or regurgitation.  Her aortic valve was tricuspid.  No regional wall motion abnormalities.  She denies any recent exertional chest pains.  No dyspnea.  No fever.  No history of cerebrovascular disease.  She does have hypertension treated with lisinopril HCTZ and amlodipine.  Blood pressure is up slightly today but this is atypical for her.  Compliant with medications.  Past Medical History:  Diagnosis Date   Allergy    Anxiety    Arthritis    Cataract    surgery - Bilateral cataracts removed   Chicken pox    Colon polyps    Depression    Diverticulitis    Diverticulosis    Esophageal stricture    Family history of adverse reaction to anesthesia    difficult time waking mother up   GERD (gastroesophageal reflux disease)    Heart murmur    Hypertension    Osteoporosis    Past Surgical History:  Procedure Laterality Date   cataract surgery Bilateral 1996   COLONOSCOPY  2022   EYE SURGERY  2000   detached L retina   HERNIA REPAIR Bilateral 1990   inguinal   NECK SURGERY     as a child   POLYPECTOMY  2022   SHOULDER ARTHROSCOPY WITH ROTATOR CUFF REPAIR AND OPEN BICEPS TENODESIS Left 12/10/2021   Procedure: LEFT SHOULDER ARTHROSCOPY WITH ROTATOR CUFF REPAIR AND OPEN BICEPS  TENODESIS;  Surgeon: Huel Cote, MD;  Location: [facility] OR;  Service: Orthopedics;  Laterality: Left;   UPPER GASTROINTESTINAL ENDOSCOPY  2022    reports that she has never smoked. She has never used smokeless tobacco. She reports current alcohol use of about 1.0 standard drink of alcohol per week. She reports that she does not use drugs. family history includes COPD in her mother; Cancer in her father; Crohn's disease in her mother; Heart disease (age of onset: 21) in her mother; Heart disease (age of onset: 71) in her father. Allergies  Allergen Reactions   Beeswax     Per skin test from dermatologist   Cetrimonium Chloride [Cetrimide]     Per skin test from dermatologist   Methylisothiazolinone     Per skin test from dermatologist   Neomycin Sulfate [Neomycin]     Per skin test from dermatologist   Nickel Sulfate [Nickel]     Per skin test from dermatologist   Penicillins     Childhood Reaction    Propolis     Per skin test from dermatologist    Review of Systems  Constitutional:  Negative for malaise/fatigue.  Eyes:  Negative for blurred vision.  Respiratory:  Negative for shortness of breath.   Cardiovascular:  Negative for chest pain.  Neurological:  Negative for dizziness, weakness and headaches.      Objective:  BP (!) 140/80 (BP Location: Right Arm, Cuff Size: Normal)   Pulse 85   Temp 98.2 F (36.8 C) (Oral)   Ht 4' 11.5" (1.511 m)   Wt 119 lb (54 kg)   SpO2 98%   BMI 23.63 kg/m  BP Readings from Last 3 Encounters:  12/06/22 (!) 140/80  10/18/22 110/70  10/06/22 128/78   Wt Readings from Last 3 Encounters:  12/06/22 119 lb (54 kg)  10/18/22 117 lb (53.1 kg)  10/06/22 117 lb (53.1 kg)      Physical Exam Vitals reviewed.  Constitutional:      General: She is not in acute distress.    Appearance: She is well-developed.  Eyes:     Pupils: Pupils are equal, round, and reactive to light.  Neck:     Thyroid: No thyromegaly.     Vascular: No JVD.   Cardiovascular:     Rate and Rhythm: Normal rate and regular rhythm.     Heart sounds: Murmur heard.     No gallop.     Comments: 2/6 systolic ejection murmur right upper sternal border over aortic valve region. Pulmonary:     Effort: Pulmonary effort is normal. No respiratory distress.     Breath sounds: Normal breath sounds. No wheezing or rales.  Musculoskeletal:     Cervical back: Neck supple.     Right lower leg: No edema.     Left lower leg: No edema.  Neurological:     Mental Status: She is alert.      No results found for any visits on 12/06/22.    The 10-year ASCVD risk score (Arnett DK, et al., 2019) is: 22.8%    Assessment & Plan:   Problem List Items Addressed This Visit   None Visit Diagnoses     Preoperative clearance    -  Primary   Relevant Orders   CBC with Differential/Platelet   Basic metabolic panel   EKG 12-Lead     Patient has hypertension.  Generally well-controlled but up slightly today.  Repeat right arm seated after rest 140/80.  She will monitor closely at home.  Watch sodium intake.  Obtain preoperative labs with CBC and basic metabolic panel.  Obtain EKG.  -No known contraindications for surgery at this time. -Forms completed -She knows to avoid aspirin or any regular nonsteroidal use for at least 5 days prior to surgery -EKG shows sinus rhythm with no acute changes. -She has history of aortic murmur with previous echo 2 years ago showing no evidence for stenosis or significant regurgitation.  No follow-ups on file.    Evelena Peat, MD

## 2022-12-06 NOTE — Patient Instructions (Signed)
Monitor blood pressure and be in touch if consistently > 140/90.   

## 2022-12-13 ENCOUNTER — Encounter
Admission: RE | Admit: 2022-12-13 | Discharge: 2022-12-13 | Disposition: A | Discharge status: Home / Self Care | Payer: Medicare HMO | Source: Ambulatory Visit | Attending: Orthopaedic Surgery | Admitting: Orthopaedic Surgery

## 2022-12-13 VITALS — BP 124/82 | HR 90 | Resp 12 | Ht 59.0 in | Wt 119.3 lb

## 2022-12-13 DIAGNOSIS — Z01812 Encounter for preprocedural laboratory examination: Secondary | ICD-10-CM | POA: Diagnosis not present

## 2022-12-13 DIAGNOSIS — Z01818 Encounter for other preprocedural examination: Secondary | ICD-10-CM

## 2022-12-13 HISTORY — DX: Strain of muscle(s) and tendon(s) of the rotator cuff of left shoulder, initial encounter: S46.012A

## 2022-12-13 HISTORY — DX: Dyskinesia of esophagus: K22.4

## 2022-12-13 HISTORY — DX: Preglaucoma, unspecified, unspecified eye: H40.009

## 2022-12-13 HISTORY — DX: Spondylolysis, cervical region: M43.02

## 2022-12-13 LAB — SURGICAL PCR SCREEN
MRSA, PCR: NEGATIVE
Staphylococcus aureus: NEGATIVE

## 2022-12-13 NOTE — Patient Instructions (Addendum)
Your procedure is scheduled on:12-16-22 Thursday Report to the Registration Desk on the 1st floor of the [facility].Then proceed to the 2nd floor Surgery Desk To find out your arrival time, please call [phone] between 1PM - 3PM on:12-15-22 Wednesday If your arrival time is 6:00 am, do not arrive before that time as the [facility] entrance doors do not open until 6:00 am.  REMEMBER: Instructions that are not followed completely may result in serious medical risk, up to and including death; or upon the discretion of your surgeon and anesthesiologist your surgery may need to be rescheduled.  Do not eat food after midnight the night before surgery.  No gum chewing or hard candies.  You may however, drink CLEAR liquids up to 2 hours before you are scheduled to arrive for your surgery. Do not drink anything within 2 hours of your scheduled arrival time.  Clear liquids include: - water  - apple juice without pulp - gatorade (not RED colors) - black coffee or tea (Do NOT add milk or creamers to the coffee or tea) Do NOT drink anything that is not on this list.  In addition, your doctor has ordered for you to drink the provided:  Ensure Pre-Surgery Clear Carbohydrate Drink  Drinking this carbohydrate drink up to two hours before surgery helps to reduce insulin resistance and improve patient outcomes. Please complete drinking 2 hours before scheduled arrival time.  One week prior to surgery: Stop Anti-inflammatories (NSAIDS) such as Advil, Aleve, Ibuprofen, Motrin, Naproxen, Naprosyn and Aspirin based products such as Excedrin, Goody's Powder, BC Powder.You may however, take Tylenol if needed for pain up until the day of surgery. Stop ANY OVER THE COUNTER supplements/vitamins NOW (12-13-22) until after surgery (Vitamin C, Multivitamin, Ginkgo Biloba, Vitamin B12, Calcium + D3, Biotin)   Continue taking all prescribed medications  TAKE ONLY THESE MEDICATIONS THE MORNING OF SURGERY WITH A  SIP OF WATER: -amLODipine (NORVASC) - cetirizine (ZYRTEC)  -omeprazole (PRILOSEC)-take one the night before and one on the morning of surgery - helps to prevent nausea after surgery.)  No Alcohol for 24 hours before or after surgery.  No Smoking including e-cigarettes for 24 hours before surgery.  No chewable tobacco products for at least 6 hours before surgery.  No nicotine patches on the day of surgery.  Do not use any "recreational" drugs for at least a week (preferably 2 weeks) before your surgery.  Please be advised that the combination of cocaine and anesthesia may have negative outcomes, up to and including death. If you test positive for cocaine, your surgery will be cancelled.  On the morning of surgery brush your teeth with toothpaste and water, you may rinse your mouth with mouthwash if you wish. Do not swallow any toothpaste or mouthwash.  Use CHG Soap as directed on instruction sheet.  Do not wear jewelry, make-up, hairpins, clips or nail polish.  For welded (permanent) jewelry: bracelets, anklets, waist bands, etc.  Please have this removed prior to surgery.  If it is not removed, there is a chance that hospital personnel will need to cut it off on the day of surgery.  Do not wear lotions, powders, or perfumes.   Do not shave body hair from the neck down 48 hours before surgery.  Contact lenses, hearing aids and dentures may not be worn into surgery.  Do not bring valuables to the hospital. [facility] is not responsible for any missing/lost belongings or valuables.   Total Shoulder Arthroplasty:  use Benzoyl  Peroxide 5% Gel as directed on instruction sheet.  Notify your doctor if there is any change in your medical condition (cold, fever, infection).  Wear comfortable clothing (specific to your surgery type) to the hospital.  After surgery, you can help prevent lung complications by doing breathing exercises.  Take deep breaths and cough every 1-2 hours. Your  doctor may order a device called an Incentive Spirometer to help you take deep breaths. When coughing or sneezing, hold a pillow firmly against your incision with both hands. This is called "splinting." Doing this helps protect your incision. It also decreases belly discomfort.  If you are being admitted to the hospital overnight, leave your suitcase in the car. After surgery it may be brought to your room.  In case of increased patient census, it may be necessary for you, the patient, to continue your postoperative care in the Same Day Surgery department.  If you are being discharged the day of surgery, you will not be allowed to drive home. You will need a responsible individual to drive you home and stay with you for 24 hours after surgery.   If you are taking public transportation, you will need to have a responsible individual with you.  Please call the Pre-admissions Testing Dept. at [phone] if you have any questions about these instructions.  Surgery Visitation Policy:  Patients having surgery or a procedure may have two visitors.  Children under the age of 45 must have an adult with them who is not the patient.    Pre-operative 5 CHG Bath Instructions   You can play a key role in reducing the risk of infection after surgery. Your skin needs to be as free of germs as possible. You can reduce the number of germs on your skin by washing with CHG (chlorhexidine gluconate) soap before surgery. CHG is an antiseptic soap that kills germs and continues to kill germs even after washing.   DO NOT use if you have an allergy to chlorhexidine/CHG or antibacterial soaps. If your skin becomes reddened or irritated, stop using the CHG and notify one of our RNs at [phone].   Please shower with the CHG soap starting 4 days before surgery using the following schedule:     Please keep in mind the following:  DO NOT shave, including legs and underarms, starting the day of your first  shower.   You may shave your face at any point before/day of surgery.  Place clean sheets on your bed the day you start using CHG soap. Use a clean washcloth (not used since being washed) for each shower. DO NOT sleep with pets once you start using the CHG.   CHG Shower Instructions:  If you choose to wash your hair and private area, wash first with your normal shampoo/soap.  After you use shampoo/soap, rinse your hair and body thoroughly to remove shampoo/soap residue.  Turn the water OFF and apply about 3 tablespoons (45 ml) of CHG soap to a CLEAN washcloth.  Apply CHG soap ONLY FROM YOUR NECK DOWN TO YOUR TOES (washing for 3-5 minutes)  DO NOT use CHG soap on face, private areas, open wounds, or sores.  Pay special attention to the area where your surgery is being performed.  If you are having back surgery, having someone wash your back for you may be helpful. Wait 2 minutes after CHG soap is applied, then you may rinse off the CHG soap.  Pat dry with a clean towel  Put on  clean clothes/pajamas   If you choose to wear lotion, please use ONLY the CHG-compatible lotions on the back of this paper.     Additional instructions for the day of surgery: DO NOT APPLY any lotions, deodorants, cologne, or perfumes.   Put on clean/comfortable clothes.  Brush your teeth.  Ask your nurse before applying any prescription medications to the skin.      CHG Compatible Lotions   Aveeno Moisturizing lotion  Cetaphil Moisturizing Cream  Cetaphil Moisturizing Lotion  Clairol Herbal Essence Moisturizing Lotion, Dry Skin  Clairol Herbal Essence Moisturizing Lotion, Extra Dry Skin  Clairol Herbal Essence Moisturizing Lotion, Normal Skin  Curel Age Defying Therapeutic Moisturizing Lotion with Alpha Hydroxy  Curel Extreme Care Body Lotion  Curel Soothing Hands Moisturizing Hand Lotion  Curel Therapeutic Moisturizing Cream, Fragrance-Free  Curel Therapeutic Moisturizing Lotion, Fragrance-Free  Curel  Therapeutic Moisturizing Lotion, Original Formula  Eucerin Daily Replenishing Lotion  Eucerin Dry Skin Therapy Plus Alpha Hydroxy Crme  Eucerin Dry Skin Therapy Plus Alpha Hydroxy Lotion  Eucerin Original Crme  Eucerin Original Lotion  Eucerin Plus Crme Eucerin Plus Lotion  Eucerin TriLipid Replenishing Lotion  Keri Anti-Bacterial Hand Lotion  Keri Deep Conditioning Original Lotion Dry Skin Formula Softly Scented  Keri Deep Conditioning Original Lotion, Fragrance Free Sensitive Skin Formula  Keri Lotion Fast Absorbing Fragrance Free Sensitive Skin Formula  Keri Lotion Fast Absorbing Softly Scented Dry Skin Formula  Keri Original Lotion  Keri Skin Renewal Lotion Keri Silky Smooth Lotion  Keri Silky Smooth Sensitive Skin Lotion  Nivea Body Creamy Conditioning Oil  Nivea Body Extra Enriched Lotion  Nivea Body Original Lotion  Nivea Body Sheer Moisturizing Lotion Nivea Crme  Nivea Skin Firming Lotion  NutraDerm 30 Skin Lotion  NutraDerm Skin Lotion  NutraDerm Therapeutic Skin Cream  NutraDerm Therapeutic Skin Lotion  ProShield Protective Hand Cream  Provon moisturizing lotionHow to Use an Geophysical data processor for Total Shoulder Arthroplasty  Before surgery, you can play an important role by reducing the number of germs on your skin by using the following products:  Benzoyl Peroxide Gel  o Reduces the number of germs present on the skin  o Applied twice a day to shoulder area starting two days before surgery  Chlorhexidine Gluconate (CHG) Soap  o An antiseptic cleaner that kills germs and bonds with the skin to continue killing germs even after washing  o Used for showering the night before surgery and morning of surgery  BENZOYL PEROXIDE 5% GEL  Please do not use if you have an allergy to benzoyl peroxide. If your skin becomes reddened/irritated stop using the benzoyl peroxide.  Starting two days before surgery, apply as follows:  1. Apply benzoyl peroxide  in the morning and at night. Apply after taking a shower. If you are not taking a shower, clean entire shoulder front, back, and side along with the armpit with a clean wet washcloth.  2. Place a quarter-sized dollop on your shoulder and rub in thoroughly, making sure to cover the front, back, and side of your shoulder, along with the armpit.  2 days before ____ AM ____ PM 1 day before ____ AM ____ PM  3. Do this twice a day for two days. (Last application is the night before surgery, AFTER using the CHG soap).  4. Do NOT apply benzoyl peroxide gel on the day of surgery.    An incentive spirometer is a tool that measures how well you are filling your lungs with each breath. Learning  to take long, deep breaths using this tool can help you keep your lungs clear and active. This may help to reverse or lessen your chance of developing breathing (pulmonary) problems, especially infection. You may be asked to use a spirometer: After a surgery. If you have a lung problem or a history of smoking. After a long period of time when you have been unable to move or be active. If the spirometer includes an indicator to show the highest number that you have reached, your health care provider or respiratory therapist will help you set a goal. Keep a log of your progress as told by your health care provider. What are the risks? Breathing too quickly may cause dizziness or cause you to pass out. Take your time so you do not get dizzy or light-headed. If you are in pain, you may need to take pain medicine before doing incentive spirometry. It is harder to take a deep breath if you are having pain. How to use your incentive spirometer  Sit up on the edge of your bed or on a chair. Hold the incentive spirometer so that it is in an upright position. Before you use the spirometer, breathe out normally. Place the mouthpiece in your mouth. Make sure your lips are closed tightly around it. Breathe in slowly and as  deeply as you can through your mouth, causing the piston or the ball to rise toward the top of the chamber. Hold your breath for 3-5 seconds, or for as long as possible. If the spirometer includes a coach indicator, use this to guide you in breathing. Slow down your breathing if the indicator goes above the marked areas. Remove the mouthpiece from your mouth and breathe out normally. The piston or ball will return to the bottom of the chamber. Rest for a few seconds, then repeat the steps 10 or more times. Take your time and take a few normal breaths between deep breaths so that you do not get dizzy or light-headed. Do this every 1-2 hours when you are awake. If the spirometer includes a goal marker to show the highest number you have reached (best effort), use this as a goal to work toward during each repetition. After each set of 10 deep breaths, cough a few times. This will help to make sure that your lungs are clear. If you have an incision on your chest or abdomen from surgery, place a pillow or a rolled-up towel firmly against the incision when you cough. This can help to reduce pain while taking deep breaths and coughing. General tips When you are able to get out of bed: Walk around often. Continue to take deep breaths and cough in order to clear your lungs. Keep using the incentive spirometer until your health care provider says it is okay to stop using it. If you have been in the hospital, you may be told to keep using the spirometer at home. Contact a health care provider if: You are having difficulty using the spirometer. You have trouble using the spirometer as often as instructed. Your pain medicine is not giving enough relief for you to use the spirometer as told. You have a fever. Get help right away if: You develop shortness of breath. You develop a cough with bloody mucus from the lungs. You have fluid or blood coming from an incision site after you cough. Summary An  incentive spirometer is a tool that can help you learn to take long, deep breaths to keep your  lungs clear and active. You may be asked to use a spirometer after a surgery, if you have a lung problem or a history of smoking, or if you have been inactive for a long period of time. Use your incentive spirometer as instructed every 1-2 hours while you are awake. If you have an incision on your chest or abdomen, place a pillow or a rolled-up towel firmly against your incision when you cough. This will help to reduce pain. Get help right away if you have shortness of breath, you cough up bloody mucus, or blood comes from your incision when you cough. This information is not intended to replace advice given to you by your health care provider. Make sure you discuss any questions you have with your health care provider. Document Revised: 06/04/2019 Document Reviewed: 06/04/2019 Elsevier Patient Education  2024 [employer].

## 2022-12-15 ENCOUNTER — Telehealth: Payer: Self-pay | Admitting: *Deleted

## 2022-12-15 NOTE — Care Plan (Signed)
[facility] RNCM call to patient to discuss her upcoming Left Reverse total shoulder arthroplasty with Dr. Steward Drone on 12/16/22. She is an Ortho bundle and is agreeable to case management. She will be going to rehab out of town at a family member's home for the first week. She sees Dr. Steward Drone back on 12/27/22 for her first post-op and OPPT scheduled at [facility] for that same date. Reviewed post op instructions. Will continue to follow for needs. SANE question of 20-30 % per patient. "I'm ready to get this done".

## 2022-12-15 NOTE — Telephone Encounter (Signed)
Ortho bundle pre-op call completed for upcoming Left Reverse total shoulder arthroplasty. SANE question=30%.

## 2022-12-16 ENCOUNTER — Encounter
Admission: RE | Disposition: A | Discharge status: Home / Self Care | Payer: Self-pay | Source: Home / Self Care | Attending: Orthopaedic Surgery

## 2022-12-16 ENCOUNTER — Other Ambulatory Visit: Payer: Self-pay

## 2022-12-16 ENCOUNTER — Ambulatory Visit
Admission: RE | Admit: 2022-12-16 | Discharge: 2022-12-16 | Disposition: A | Discharge status: Home / Self Care | Payer: Medicare HMO | Attending: Orthopaedic Surgery | Admitting: Orthopaedic Surgery

## 2022-12-16 ENCOUNTER — Ambulatory Visit: Payer: Medicare HMO

## 2022-12-16 ENCOUNTER — Encounter (HOSPITAL_BASED_OUTPATIENT_CLINIC_OR_DEPARTMENT_OTHER): Payer: Self-pay | Admitting: Orthopaedic Surgery

## 2022-12-16 ENCOUNTER — Ambulatory Visit: Payer: Medicare HMO | Admitting: Urgent Care

## 2022-12-16 ENCOUNTER — Encounter: Payer: Self-pay | Admitting: Orthopaedic Surgery

## 2022-12-16 DIAGNOSIS — K219 Gastro-esophageal reflux disease without esophagitis: Secondary | ICD-10-CM | POA: Diagnosis not present

## 2022-12-16 DIAGNOSIS — M7522 Bicipital tendinitis, left shoulder: Secondary | ICD-10-CM

## 2022-12-16 DIAGNOSIS — M19012 Primary osteoarthritis, left shoulder: Secondary | ICD-10-CM | POA: Diagnosis not present

## 2022-12-16 DIAGNOSIS — G8918 Other acute postprocedural pain: Secondary | ICD-10-CM | POA: Diagnosis not present

## 2022-12-16 DIAGNOSIS — S46012A Strain of muscle(s) and tendon(s) of the rotator cuff of left shoulder, initial encounter: Secondary | ICD-10-CM

## 2022-12-16 DIAGNOSIS — M75102 Unspecified rotator cuff tear or rupture of left shoulder, not specified as traumatic: Secondary | ICD-10-CM | POA: Insufficient documentation

## 2022-12-16 DIAGNOSIS — W19XXXA Unspecified fall, initial encounter: Secondary | ICD-10-CM | POA: Insufficient documentation

## 2022-12-16 DIAGNOSIS — M810 Age-related osteoporosis without current pathological fracture: Secondary | ICD-10-CM | POA: Insufficient documentation

## 2022-12-16 DIAGNOSIS — Z471 Aftercare following joint replacement surgery: Secondary | ICD-10-CM | POA: Diagnosis not present

## 2022-12-16 DIAGNOSIS — Z96612 Presence of left artificial shoulder joint: Secondary | ICD-10-CM | POA: Diagnosis not present

## 2022-12-16 HISTORY — PX: REVERSE SHOULDER ARTHROPLASTY: SHX5054

## 2022-12-16 LAB — TYPE AND SCREEN
ABO/RH(D): A NEG
Antibody Screen: NEGATIVE

## 2022-12-16 LAB — ABO/RH: ABO/RH(D): A NEG

## 2022-12-16 SURGERY — ARTHROPLASTY, SHOULDER, TOTAL, REVERSE
Anesthesia: General | Site: Shoulder | Laterality: Left

## 2022-12-16 MED ORDER — EPHEDRINE SULFATE (PRESSORS) 50 MG/ML IJ SOLN
INTRAMUSCULAR | Status: DC | PRN
Start: 2022-12-16 — End: 2022-12-16
  Administered 2022-12-16: 5 mg via INTRAVENOUS

## 2022-12-16 MED ORDER — LIDOCAINE HCL (PF) 1 % IJ SOLN
INTRAMUSCULAR | Status: DC | PRN
Start: 2022-12-16 — End: 2022-12-16
  Administered 2022-12-16: 4 mL

## 2022-12-16 MED ORDER — OXYCODONE HCL 5 MG PO TABS: 5.0000 mg | ORAL_TABLET | Freq: Once | ORAL | Status: DC | PRN

## 2022-12-16 MED ORDER — LIDOCAINE HCL (PF) 1 % IJ SOLN
INTRAMUSCULAR | Status: AC
  Filled 2022-12-16: qty 10

## 2022-12-16 MED ORDER — VANCOMYCIN HCL 1000 MG IV SOLR
INTRAVENOUS | Status: DC | PRN
  Administered 2022-12-16: 1000 mg

## 2022-12-16 MED ORDER — PROPOFOL 10 MG/ML IV BOLUS
INTRAVENOUS | Status: DC | PRN
  Administered 2022-12-16: 150 mg via INTRAVENOUS

## 2022-12-16 MED ORDER — FENTANYL CITRATE (PF) 100 MCG/2ML IJ SOLN
INTRAMUSCULAR | Status: DC | PRN
  Administered 2022-12-16: 50 ug via INTRAVENOUS

## 2022-12-16 MED ORDER — DEXAMETHASONE SODIUM PHOSPHATE 10 MG/ML IJ SOLN
INTRAMUSCULAR | Status: DC | PRN
  Administered 2022-12-16: 10 mg via INTRAVENOUS

## 2022-12-16 MED ORDER — FENTANYL CITRATE PF 50 MCG/ML IJ SOSY
PREFILLED_SYRINGE | INTRAMUSCULAR | Status: AC
  Filled 2022-12-16: qty 1

## 2022-12-16 MED ORDER — PHENYLEPHRINE HCL-NACL 20-0.9 MG/250ML-% IV SOLN
INTRAVENOUS | Status: AC
  Filled 2022-12-16: qty 250

## 2022-12-16 MED ORDER — ORAL CARE MOUTH RINSE: 15.0000 mL | Freq: Once | OROMUCOSAL | Status: AC

## 2022-12-16 MED ORDER — SODIUM CHLORIDE 0.9 % IR SOLN
Status: DC | PRN
  Administered 2022-12-16: 1000 mL

## 2022-12-16 MED ORDER — ACETAMINOPHEN 500 MG PO TABS
ORAL_TABLET | ORAL | Status: AC
  Filled 2022-12-16: qty 1

## 2022-12-16 MED ORDER — LACTATED RINGERS IV SOLN: INTRAVENOUS | Status: DC

## 2022-12-16 MED ORDER — FENTANYL CITRATE PF 50 MCG/ML IJ SOSY
50.0000 ug | PREFILLED_SYRINGE | Freq: Once | INTRAMUSCULAR | Status: AC
  Administered 2022-12-16: 50 ug via INTRAVENOUS

## 2022-12-16 MED ORDER — DEXMEDETOMIDINE HCL IN NACL 200 MCG/50ML IV SOLN
INTRAVENOUS | Status: DC | PRN
Start: 2022-12-16 — End: 2022-12-16
  Administered 2022-12-16: 8 ug via INTRAVENOUS

## 2022-12-16 MED ORDER — LIDOCAINE HCL (CARDIAC) PF 100 MG/5ML IV SOSY
PREFILLED_SYRINGE | INTRAVENOUS | Status: DC | PRN
  Administered 2022-12-16: 20 mg via INTRAVENOUS

## 2022-12-16 MED ORDER — BUPIVACAINE LIPOSOME 1.3 % IJ SUSP
INTRAMUSCULAR | Status: DC | PRN
Start: 2022-12-16 — End: 2022-12-16
  Administered 2022-12-16: 20 mL

## 2022-12-16 MED ORDER — OXYCODONE HCL 5 MG/5ML PO SOLN: 5.0000 mg | Freq: Once | ORAL | Status: DC | PRN

## 2022-12-16 MED ORDER — ESMOLOL HCL 100 MG/10ML IV SOLN
INTRAVENOUS | Status: DC | PRN
Start: 2022-12-16 — End: 2022-12-16
  Administered 2022-12-16: 10 mg via INTRAVENOUS

## 2022-12-16 MED ORDER — 0.9 % SODIUM CHLORIDE (POUR BTL) OPTIME
TOPICAL | Status: DC | PRN
  Administered 2022-12-16: 1000 mL

## 2022-12-16 MED ORDER — BUPIVACAINE LIPOSOME 1.3 % IJ SUSP
INTRAMUSCULAR | Status: AC
  Filled 2022-12-16: qty 20

## 2022-12-16 MED ORDER — SUGAMMADEX SODIUM 200 MG/2ML IV SOLN
INTRAVENOUS | Status: DC | PRN
  Administered 2022-12-16: 200 mg via INTRAVENOUS

## 2022-12-16 MED ORDER — FENTANYL CITRATE (PF) 100 MCG/2ML IJ SOLN: 25.0000 ug | INTRAMUSCULAR | Status: DC | PRN

## 2022-12-16 MED ORDER — FENTANYL CITRATE (PF) 100 MCG/2ML IJ SOLN
INTRAMUSCULAR | Status: AC
  Filled 2022-12-16: qty 2

## 2022-12-16 MED ORDER — PHENYLEPHRINE 80 MCG/ML (10ML) SYRINGE FOR IV PUSH (FOR BLOOD PRESSURE SUPPORT)
PREFILLED_SYRINGE | INTRAVENOUS | Status: DC | PRN
  Administered 2022-12-16 (×3): 160 ug via INTRAVENOUS

## 2022-12-16 MED ORDER — ONDANSETRON HCL 4 MG/2ML IJ SOLN
INTRAMUSCULAR | Status: DC | PRN
  Administered 2022-12-16 (×2): 4 mg via INTRAVENOUS

## 2022-12-16 MED ORDER — CHLORHEXIDINE GLUCONATE 0.12 % MT SOLN
OROMUCOSAL | Status: AC
  Filled 2022-12-16: qty 15

## 2022-12-16 MED ORDER — GLYCOPYRROLATE 0.2 MG/ML IJ SOLN
INTRAMUSCULAR | Status: DC | PRN
  Administered 2022-12-16: .2 mg via INTRAVENOUS

## 2022-12-16 MED ORDER — PHENYLEPHRINE HCL-NACL 20-0.9 MG/250ML-% IV SOLN
INTRAVENOUS | Status: DC | PRN
  Administered 2022-12-16: 15 ug/min via INTRAVENOUS

## 2022-12-16 MED ORDER — ROCURONIUM BROMIDE 100 MG/10ML IV SOLN
INTRAVENOUS | Status: DC | PRN
  Administered 2022-12-16: 10 mg via INTRAVENOUS
  Administered 2022-12-16: 50 mg via INTRAVENOUS

## 2022-12-16 MED ORDER — ACETAMINOPHEN 500 MG PO TABS
1000.0000 mg | ORAL_TABLET | Freq: Once | ORAL | Status: AC
  Administered 2022-12-16: 1000 mg via ORAL

## 2022-12-16 MED ORDER — POVIDONE-IODINE 10 % EX SOLN
CUTANEOUS | Status: DC | PRN
  Administered 2022-12-16: 1 via TOPICAL

## 2022-12-16 MED ORDER — CHLORHEXIDINE GLUCONATE 0.12 % MT SOLN
15.0000 mL | Freq: Once | OROMUCOSAL | Status: AC
  Administered 2022-12-16: 15 mL via OROMUCOSAL

## 2022-12-16 MED ORDER — TRANEXAMIC ACID-NACL 1000-0.7 MG/100ML-% IV SOLN
1000.0000 mg | INTRAVENOUS | Status: AC
  Administered 2022-12-16: 1000 mg via INTRAVENOUS
  Filled 2022-12-16: qty 100

## 2022-12-16 MED ORDER — VANCOMYCIN HCL 1000 MG IV SOLR
INTRAVENOUS | Status: AC
  Filled 2022-12-16: qty 20

## 2022-12-16 MED ORDER — GABAPENTIN 300 MG PO CAPS
300.0000 mg | ORAL_CAPSULE | Freq: Once | ORAL | Status: AC
  Administered 2022-12-16: 300 mg via ORAL

## 2022-12-16 MED ORDER — GABAPENTIN 300 MG PO CAPS
ORAL_CAPSULE | ORAL | Status: AC
  Filled 2022-12-16: qty 1

## 2022-12-16 MED ORDER — CEFAZOLIN SODIUM-DEXTROSE 2-4 GM/100ML-% IV SOLN
INTRAVENOUS | Status: AC
  Filled 2022-12-16: qty 100

## 2022-12-16 MED ORDER — MIDAZOLAM HCL 2 MG/2ML IJ SOLN
INTRAMUSCULAR | Status: AC
  Filled 2022-12-16: qty 2

## 2022-12-16 MED ORDER — CEFAZOLIN SODIUM-DEXTROSE 2-4 GM/100ML-% IV SOLN
2.0000 g | INTRAVENOUS | Status: AC
  Administered 2022-12-16: 2 g via INTRAVENOUS

## 2022-12-16 SURGICAL SUPPLY — 69 items
ANCH SUT 2 2.9 2 LD TPR NDL (Anchor) ×1 IMPLANT
ANCHOR JUGGERKNOT WTAP NDL 2.9 (Anchor) IMPLANT
APL PRP STRL LF DISP 70% ISPRP (MISCELLANEOUS) ×1
AUG BASEPLATE 15DEG 25 WEDGE (Joint) ×1 IMPLANT
AUGMENT BASEPLATE 15DEG 25 WDG (Joint) IMPLANT
BASEPLATE GLENOID RSA 3X25 0D (Shoulder) IMPLANT
BIT DRILL 3.2 PERIPHERAL SCREW (BIT) IMPLANT
BLADE SAGITTAL WIDE XTHICK NO (BLADE) ×1 IMPLANT
BSPLAT GLND +3 25 (Shoulder) ×1 IMPLANT
BSPLAT GLND 15D 25 FULL WDG (Joint) ×1 IMPLANT
CHLORAPREP W/TINT 26 (MISCELLANEOUS) ×1 IMPLANT
COOLER POLAR GLACIER W/PUMP (MISCELLANEOUS) ×1 IMPLANT
COVER LIGHT HANDLE STERIS (MISCELLANEOUS) ×1 IMPLANT
DRAPE INCISE IOBAN 66X45 STRL (DRAPES) ×1 IMPLANT
DRAPE U-SHAPE 47X51 STRL (DRAPES) ×2 IMPLANT
DRSG AQUACEL AG ADV 3.5X10 (GAUZE/BANDAGES/DRESSINGS) ×1 IMPLANT
ELECT BLADE 4.0 EZ CLEAN MEGAD (MISCELLANEOUS) ×1
ELECT REM PT RETURN 9FT ADLT (ELECTROSURGICAL) ×1
ELECTRODE BLDE 4.0 EZ CLN MEGD (MISCELLANEOUS) ×1 IMPLANT
ELECTRODE REM PT RTRN 9FT ADLT (ELECTROSURGICAL) ×1 IMPLANT
GLENOSPHERE STD CANN 36 (Joint) IMPLANT
GLOVE BIO SURGEON STRL SZ 6 (GLOVE) ×1 IMPLANT
GLOVE BIO SURGEON STRL SZ7.5 (GLOVE) ×3 IMPLANT
GLOVE BIOGEL PI IND STRL 6.5 (GLOVE) ×1 IMPLANT
GLOVE BIOGEL PI IND STRL 8 (GLOVE) ×2 IMPLANT
GLOVE INDICATOR 8.0 STRL GRN (GLOVE) ×1 IMPLANT
GOWN STRL REUS W/ TWL LRG LVL3 (GOWN DISPOSABLE) ×2 IMPLANT
GOWN STRL REUS W/TWL LRG LVL3 (GOWN DISPOSABLE) ×2
GUIDE PIN 3X75 SHOULDER (PIN) ×2
GUIDEWIRE GLENOID 2.5X220 (WIRE) IMPLANT
INSERT REVERSED HUMERAL SIZE 1 (Orthopedic Implant) IMPLANT
KIT STABILIZATION SHOULDER (MISCELLANEOUS) ×1 IMPLANT
KIT TURNOVER KIT A (KITS) ×1 IMPLANT
MANIFOLD NEPTUNE II (INSTRUMENTS) ×1 IMPLANT
MASK FACE SPIDER DISP (MASK) ×1 IMPLANT
MAT ABSORB FLUID 56X50 GRAY (MISCELLANEOUS) ×1 IMPLANT
NS IRRIG 1000ML POUR BTL (IV SOLUTION) ×1 IMPLANT
PACK ARTHROSCOPY SHOULDER (MISCELLANEOUS) ×2 IMPLANT
PAD ARMBOARD 7.5X6 YLW CONV (MISCELLANEOUS) ×2 IMPLANT
PAD WRAPON POLAR SHDR UNIV (MISCELLANEOUS) ×1 IMPLANT
PAD WRAPON POLAR SHDR XLG (MISCELLANEOUS) ×1 IMPLANT
PIN GUIDE 3X75 SHOULDER (PIN) IMPLANT
PULSAVAC PLUS IRRIG FAN TIP (DISPOSABLE) ×1
SCREW 5.0X18 (Screw) IMPLANT
SCREW 5.5X22 (Screw) IMPLANT
SCREW 5.5X26 (Screw) IMPLANT
SCREW BONE INTRNL SM 7 (Screw) IMPLANT
SCREW PERIPHERAL 30 (Screw) IMPLANT
SLING ULTRA II LG (MISCELLANEOUS) ×1 IMPLANT
SPONGE T-LAP 18X18 ~~LOC~~+RFID (SPONGE) ×1 IMPLANT
STEM HUMERAL PLUS LONG SZ2 (Orthopedic Implant) IMPLANT
STRAP SAFETY 5IN WIDE (MISCELLANEOUS) ×1 IMPLANT
SUCTION TUBE FRAZIER 10FR DISP (SUCTIONS) ×1 IMPLANT
SUT ETHIBOND 2 V 37 (SUTURE) IMPLANT
SUT ETHILON 3 0 PS 1 (SUTURE) IMPLANT
SUT FIBERWIRE #5 38 CONV NDL (SUTURE)
SUT VIC AB 0 CT1 36 (SUTURE) IMPLANT
SUT VIC AB 2-0 CT1 27 (SUTURE) ×2
SUT VIC AB 2-0 CT1 TAPERPNT 27 (SUTURE) ×2 IMPLANT
SUTURE FIBERWR #5 38 CONV NDL (SUTURE) ×2 IMPLANT
SYR 50ML LL SCALE MARK (SYRINGE) ×1 IMPLANT
TAPE LABRALWHITE 1.5X36 (TAPE) IMPLANT
TAPE SUT LABRALTAP WHT/BLK (SUTURE) IMPLANT
TIP FAN IRRIG PULSAVAC PLUS (DISPOSABLE) ×1 IMPLANT
TOWEL OR 17X26 4PK STRL BLUE (TOWEL DISPOSABLE) ×1 IMPLANT
TRAP FLUID SMOKE EVACUATOR (MISCELLANEOUS) ×1 IMPLANT
WATER STERILE IRR 1000ML POUR (IV SOLUTION) ×1 IMPLANT
WRAPON POLAR PAD SHDR UNIV (MISCELLANEOUS) ×1
WRAPON POLAR PAD SHDR XLG (MISCELLANEOUS) ×1

## 2022-12-16 NOTE — Transfer of Care (Signed)
Immediate Anesthesia Transfer of Care Note  Patient: Heidi Shah  Procedure(s) Performed: LEFT REVERSE SHOULDER ARTHROPLASTY (Left: Shoulder)  Patient Location: PACU  Anesthesia Type:General  Level of Consciousness: drowsy and patient cooperative  Airway & Oxygen Therapy: Patient Spontanous Breathing and Patient connected to face mask oxygen  Post-op Assessment: Report given to RN and Post -op Vital signs reviewed and stable  Post vital signs: Reviewed and stable  Last Vitals:  Vitals Value Taken Time  BP 111/77 12/16/22 1457  Temp    Pulse 97 12/16/22 1500  Resp 15 12/16/22 1500  SpO2 100 % 12/16/22 1500  Vitals shown include unfiled device data.  Last Pain:  Vitals:   12/16/22 1031  TempSrc: Tympanic  PainSc: 0-No pain         Complications: No notable events documented.

## 2022-12-16 NOTE — Anesthesia Preprocedure Evaluation (Addendum)
Anesthesia Evaluation  Patient identified by MRN, date of birth, ID band Patient awake    Reviewed: Allergy & Precautions, NPO status , Patient's Chart, lab work & pertinent test results  History of Anesthesia Complications Negative for: history of anesthetic complications  Airway Mallampati: III  TM Distance: >3 FB Neck ROM: full    Dental no notable dental hx.    Pulmonary neg pulmonary ROS   Pulmonary exam normal        Cardiovascular hypertension, Pt. on medications Normal cardiovascular exam+ Valvular Problems/Murmurs   Echo 2022 IMPRESSIONS     1. Left ventricular ejection fraction, by estimation, is 65 to 70%. The  left ventricle has normal function. The left ventricle has no regional  wall motion abnormalities. Left ventricular diastolic parameters are  consistent with Grade I diastolic  dysfunction (impaired relaxation).   2. Right ventricular systolic function is normal. The right ventricular  size is normal.   3. The mitral valve is normal in structure. Trivial mitral valve  regurgitation. No evidence of mitral stenosis.   4. The aortic valve is tricuspid. Aortic valve regurgitation is not  visualized. Mild aortic valve sclerosis is present, with no evidence of  aortic valve stenosis.   5. Aortic dilatation noted. There is borderline dilatation of the  ascending aorta, measuring 38 mm.   6. The inferior vena cava is normal in size with greater than 50%  respiratory variability, suggesting right atrial pressure of 3 mmHg.      Neuro/Psych  PSYCHIATRIC DISORDERS Anxiety Depression     Neuromuscular disease    GI/Hepatic Neg liver ROS,GERD  Medicated,,  Endo/Other  negative endocrine ROS    Renal/GU      Musculoskeletal  (+) Arthritis ,    Abdominal   Peds  Hematology negative hematology ROS (+)   Anesthesia Other Findings Past Medical History: No date: Allergy No date: Anxiety No date:  Arthritis No date: Borderline glaucoma No date: Cataract     Comment:  surgery - Bilateral cataracts removed No date: Cervical spondylolysis No date: Chicken pox No date: Colon polyps No date: Depression No date: Diverticulitis No date: Diverticulosis No date: Esophageal spasm No date: Esophageal stricture No date: Family history of adverse reaction to anesthesia     Comment:  difficult time waking mother up No date: GERD (gastroesophageal reflux disease) No date: Heart murmur No date: Hypertension No date: Osteoporosis No date: Traumatic complete tear of left rotator cuff  Past Surgical History: 1996: cataract surgery; Bilateral 2022: COLONOSCOPY 2000: EYE SURGERY     Comment:  detached L retina 1990: HERNIA REPAIR; Bilateral     Comment:  inguinal No date: NECK SURGERY     Comment:  age 32-trimmed a muscle due to not being able to hold her              head up 2022: POLYPECTOMY 12/10/2021: SHOULDER ARTHROSCOPY WITH ROTATOR CUFF REPAIR AND OPEN  BICEPS TENODESIS; Left     Comment:  Procedure: LEFT SHOULDER ARTHROSCOPY WITH ROTATOR CUFF               REPAIR AND OPEN BICEPS TENODESIS;  Surgeon: Huel Cote, MD;  Location: [facility] OR;  Service: Orthopedics;                Laterality: Left; 2022: UPPER GASTROINTESTINAL ENDOSCOPY  BMI    Body Mass Index: 23.63 kg/m  Reproductive/Obstetrics negative OB ROS                             Anesthesia Physical Anesthesia Plan  ASA: 2  Anesthesia Plan: General ETT   Post-op Pain Management: Regional block*, Toradol IV (intra-op)*, Tylenol PO (pre-op)* and Gabapentin PO (pre-op)*   Induction:   PONV Risk Score and Plan: 3 and Ondansetron, Dexamethasone, Midazolam and Treatment may vary due to age or medical condition  Airway Management Planned:   Additional Equipment:   Intra-op Plan:   Post-operative Plan:   Informed Consent: I have reviewed the patients History and  Physical, chart, labs and discussed the procedure including the risks, benefits and alternatives for the proposed anesthesia with the patient or authorized representative who has indicated his/her understanding and acceptance.     Dental Advisory Given  Plan Discussed with: Anesthesiologist, CRNA and Surgeon  Anesthesia Plan Comments:         Anesthesia Quick Evaluation

## 2022-12-16 NOTE — Op Note (Signed)
Date of Surgery: 12/16/2022  INDICATIONS: Ms. Midgette is a 77 y.o.-year-old female with failed left rotator cuff repair after a subsequent fall.  The risk and benefits of the procedure were discussed in detail and documented in the pre-operative evaluation.   PREOPERATIVE DIAGNOSIS: 1. Left rotator cuff arthropathy  POSTOPERATIVE DIAGNOSIS: Same.  PROCEDURE: 1. Left reverse shoulder arthroplasty 2. Left biceps tenodesis  SURGEON: Benancio Deeds MD  ASSISTANT: Kerby Less, ATC  ANESTHESIA:  general  IV FLUIDS AND URINE: See anesthesia record.  ANTIBIOTICS: Ancef  ESTIMATED BLOOD LOSS: 50 mL.  IMPLANTS:  Implant Name Type Inv. Item Serial No. Manufacturer Lot No. LRB No. Used Action  cannulated titanium standard glenosphere 36mm   [id number] [employer]  Left 1 Implanted  SCREW 5.5X22 - [id number] Screw SCREW 5.5X22  [employer]  Left 2 Implanted  SCREW 5.5X26 - [id number] Screw SCREW 5.5X26  [employer]  Left 1 Implanted  BSPLAT GLND +3 25 - [id number] Shoulder BSPLAT GLND +3 25 [id number] [employer]  Left 1 Implanted  STEM HUMERAL PLUS LONG SZ2 - [id number] Orthopedic Implant STEM HUMERAL PLUS LONG SZ2 [id number] [employer]  Left 1 Implanted  INSERT REVERSED HUMERAL SIZE 1 - [id number] Orthopedic Implant INSERT REVERSED HUMERAL SIZE 1 [id number] [employer]  Left 1 Implanted  SCREW PERIPHERAL 30 - [id number] Screw SCREW PERIPHERAL 30  [employer]  Left 1 Implanted  ANCH SUT 2 2.9 2 LD TPR NDL - [id number] Anchor ANCH SUT 2 2.9 2 LD TPR NDL  [employer](ORTH,TRAU,BIO,SG) [id number] Left 1 Implanted    DRAINS: None  CULTURES: None  COMPLICATIONS: none  DESCRIPTION OF PROCEDURE:  Patient was identified in the preoperative holding area.  Anesthesia performed an interscalene nerve block after universal timeout was performed with nursing.  Ancef was given 1 hour prior to skin incision.    The surgical site was scrubbed with a chlorhexidine scrub brush and alcohol.   The patient was then prepped with chlorhexidine skin prep.  The patient was subsequently taken back to the operating room.  Anesthesia was induced. The patient was transferred to the beachchair position.  All bony prominences were padded.  Final timeout was again performed.     The bony landmarks of the shoulder were marked with a marking pen. A delto-pectoral incision was made, extending up approximately 5 inches. The wound with then irrigated with dilute betadine. Cephalic vein was identified, and an protected. This was retracted medially. Subdeltoid and subpectoral lesions were released. Neurovascular structures were carefully protected. The Gelpi retractor was used to retract the deltoid and pectoralis major.    The deltoid was retracted laterally with a Brown humeral retractor.  The conjoined tendon was identified. The cleido-pectoral fascia was excised.  The axillary nerve was palpated and carefully protected throughout the procedure. The biceps tendon was found and tenodesed to the upper pec with # 2 Ethibond non-absorbable suture.  Proximally the biceps tendon was removed up to the joint.  The bicipital groove was used for a landmark to establish rotator cuff interval. The subscap was tagged with a #2 Ethibond.  At this point the subscapularis was peeled off from the lesser tuberosity with care to avoid dissection distally in order to protect the axillary nerve.  Once the joint was exposed the proximal humerus was delivered with external rotation and extension of the arm. The humerus was prepped initially by performing a humeral neck cut. This was done with the guide using 30 degrees of retroversion as a reference.  The head portion was removed.  A medullary sounding reamer was then used.  We subsequently placed our guidewire through the center of the humeral head using the reference guide.  This was a size 2.  Metaphyseal reamer was then used.  Finally the size 2 broach was malleted into place with  excellent purchase.  A tonsil clamp was used to attempt to pull this out with very good purchase   Attention was then turned to the glenoid.  Posteriorly a large Darach retractor was used.  A 360 Degree release of the subscapularis and glenoid were done. The capsule was released from the humerus.    Glenoid retractors were placed posteriorly, superiorly behind the biceps tendon and anteriorly on the glenoid neck. A 360-degree release of the capsule was performed with cautery.  The triceps was released off the inferior tubercle of the glenoid. The axillary nerve was carefully protected with the surgeon's index finger, retracting it and using cautery.   A guidepin was placed through the glenoid guide. The guidepin was drilled until it exited the cortex. The guidepin was over drilled. Next, the glenoid was prepared with the reamer down to cortical bone.  The central peg hole was totally within the scapular neck tested with the probe.  The baseplate was then placed screwed securely with good purchase in position and then secured with 4 screws. In each case, they were drilled and measured and the appropriate length screw placed with excellent rigid fixation of the baseplate. The glenosphere was placed with size based based on pre-operative templating. As the glenosphere was being tightened, I felt the baseplate give way. As a result the glenosphere was removed and examined. There was a small fracture in the posterior aspect of the glenosphere. As a result the central post was switched to a 6.5 mm central screw with was drilled along the alternate scapular line. This has excellent purchase. The peripheral holes were redrilled with excellent purchase and the glenosphere was replaced all with excellent purchase this time.   The humerus was then delivered and a neutral polyethylene trial was placed.  This was brought to just the level of the reduction but not completely reduced.  A neural non-retentive final poly was  selected and impacted.    Appropriate tension was noted on the conjoined tendon and deltoid muscle.  Extension was stable, external and internal rotation as well.  The subscap was pulled over but as this was not able to reach comfortably decision was made not to repair in order to prevent limited in external rotation.  The wound was then irrigated. Vancomycin powder was placed in the wound again for infection prevention.   The wound was then closed in layers with 0 Vicryl interrupted in the deep subcu followed by 2-0 Vicryl in the superficial subcu and 3-0 nylon for skin.  An Aquacel dressing was applied as well as an Veterinary surgeon.  A shoulder immobilizer was applied.     Postoperative Plan: -The patient will begin the reverse shoulder rehab protocol  -Aspirin 325 mg daily will be used for 4 weeks for blood clot prevention -I will see the patient back in 2 weeks for first postoperative wound check    Benancio Deeds, MD 2:29 PM

## 2022-12-16 NOTE — Brief Op Note (Signed)
   Brief Op Note  Date of Surgery: 12/16/2022  Preoperative Diagnosis: LEFT ROTATOR CUFF ARTHROPLASTY  Postoperative Diagnosis: same  Procedure: Procedure(s): LEFT REVERSE SHOULDER ARTHROPLASTY  Implants: Implant Name Type Inv. Item Serial No. Manufacturer Lot No. LRB No. Used Action  cannulated titanium standard glenosphere 36mm   [id number] [employer]  Left 1 Implanted  SCREW 5.5X22 - [id number] Screw SCREW 5.5X22  [employer]  Left 2 Implanted  SCREW 5.5X26 - [id number] Screw SCREW 5.5X26  [employer]  Left 1 Implanted  BSPLAT GLND +3 25 - [id number] Shoulder BSPLAT GLND +3 25 [id number] [employer]  Left 1 Implanted  STEM HUMERAL PLUS LONG SZ2 - [id number] Orthopedic Implant STEM HUMERAL PLUS LONG SZ2 [id number] [employer]  Left 1 Implanted  INSERT REVERSED HUMERAL SIZE 1 - [id number] Orthopedic Implant INSERT REVERSED HUMERAL SIZE 1 [id number] [employer]  Left 1 Implanted  SCREW PERIPHERAL 30 - [id number] Screw SCREW PERIPHERAL 30  [employer]  Left 1 Implanted  ANCH SUT 2 2.9 2 LD TPR NDL - [id number] Anchor ANCH SUT 2 2.9 2 LD TPR NDL  [employer](ORTH,TRAU,BIO,SG) [id number] Left 1 Implanted    Surgeons: Surgeon(s): Huel Cote, MD  Anesthesia: General    Estimated Blood Loss: See anesthesia record  Complications: None  Condition to PACU: Stable  Benancio Deeds, MD 12/16/2022 2:28 PM

## 2022-12-16 NOTE — Anesthesia Procedure Notes (Signed)
Anesthesia Regional Block: Interscalene brachial plexus block   Pre-Anesthetic Checklist: , timeout performed,  Correct Patient, Correct Site, Correct Laterality,  Correct Procedure, Correct Position, site marked,  Risks and benefits discussed,  Surgical consent,  Pre-op evaluation,  At surgeon's request and post-op pain management  Laterality: Upper and Left  Prep: alcohol swabs       Needles:  Injection technique: Single-shot  Needle Type: Echogenic Needle     Needle Length: 4cm  Needle Gauge: 25     Additional Needles:   Procedures:,,,, ultrasound used (permanent image in chart),,    Narrative:  Start time: 12/16/2022 11:53 AM End time: 12/16/2022 11:55 AM Injection made incrementally with aspirations every 5 mL.  Performed by: Personally  Anesthesiologist: Louie Boston, MD  Additional Notes: Patient's chart reviewed and they were deemed appropriate candidate for procedure, at surgeon's request. Patient educated about risks, benefits, and alternatives of the block including but not limited to: temporary or permanent nerve damage, bleeding, infection, damage to surround tissues, pneumothorax, hemidiaphragmatic paralysis, unilateral Horner's syndrome, block failure, local anesthetic toxicity. Patient expressed understanding. A formal time-out was conducted consistent with institution rules.  Monitors were applied, and minimal sedation used (see nursing record). The site was prepped with skin prep and allowed to dry, and sterile gloves were used. A high frequency linear ultrasound probe with probe cover was utilized throughout. C5-7 nerve roots located and appeared anatomically normal, local anesthetic injected around them, and echogenic block needle trajectory was monitored throughout. Aspiration performed every 5ml. Lung and blood vessels were avoided. All injections were performed without resistance and free of blood and paresthesias. The patient tolerated the procedure  well.  Injectate: 10ml exparel + 10ml 0.5% bupivacaine

## 2022-12-16 NOTE — H&P (Signed)
Post Operative Evaluation      Procedure/Date of Surgery: 12/10/21 left shoulder rotator cuff repair and biceps tenodesis   Interval History:    12/01/2022: Presents today for further discussion of the left shoulder as well as her right shoulder.  She is having some tenderness about the right shoulder and is hoping to have an ultrasound-guided injection on the side.   Presents today status post left rotator cuff.  Unfortunately she did have a recent episode where her dog pulled out the left leash and she feel a pop with subsequently decreased range of motion and overall strength.  She is here today for further assessment of this.   PMH/PSH/Family History/Social History/Meds/Allergies:         Past Medical History:  Diagnosis Date   Allergy     Anxiety     Arthritis     Cataract      surgery - Bilateral cataracts removed   Chicken pox     Colon polyps     Depression     Diverticulitis     Diverticulosis     Esophageal stricture     Family history of adverse reaction to anesthesia      difficult time waking mother up   GERD (gastroesophageal reflux disease)     Heart murmur     Hypertension     Osteoporosis               Past Surgical History:  Procedure Laterality Date   cataract surgery Bilateral 1996   COLONOSCOPY   2022   EYE SURGERY   2000    detached L retina   HERNIA REPAIR Bilateral 1990    inguinal   NECK SURGERY        as a child   POLYPECTOMY   2022   SHOULDER ARTHROSCOPY WITH ROTATOR CUFF REPAIR AND OPEN BICEPS TENODESIS Left 12/10/2021    Procedure: LEFT SHOULDER ARTHROSCOPY WITH ROTATOR CUFF REPAIR AND OPEN BICEPS TENODESIS;  Surgeon: Huel Cote, MD;  Location: [facility] OR;  Service: Orthopedics;  Laterality: Left;   UPPER GASTROINTESTINAL ENDOSCOPY   2022        Social History         Socioeconomic History   Marital status: Divorced      Spouse name: Not on file   Number of children: 2   Years of education: master's degree    Highest education level: Master's degree (e.g., MA, MS, MEng, MEd, MSW, MBA)  Occupational History   Occupation: retired Runner, broadcasting/film/video  Tobacco Use   Smoking status: Never   Smokeless tobacco: Never  Vaping Use   Vaping status: Never Used  Substance and Sexual Activity   Alcohol use: Yes      Alcohol/week: 1.0 standard drink of alcohol      Types: 1 Glasses of wine per week      Comment: one or two glasses of wine a couple times a week   Drug use: No   Sexual activity: Not on file  Other Topics Concern   Not on file  Social History Narrative         Lives alone in ranch home.     2 children    Son lives in [place]    Continues to teach virtually part time    Social Determinants of Health        Financial Resource Strain: Low Risk  (05/28/2022)    Overall Financial Resource Strain (CARDIA)  Difficulty of Paying Living Expenses: Not hard at all  Food Insecurity: No Food Insecurity (05/28/2022)    Hunger Vital Sign     Worried About Running Out of Food in the Last Year: Never true     Ran Out of Food in the Last Year: Never true  Transportation Needs: No Transportation Needs (05/28/2022)    PRAPARE - Therapist, art (Medical): No     Lack of Transportation (Non-Medical): No  Physical Activity: Insufficiently Active (05/28/2022)    Exercise Vital Sign     Days of Exercise per Week: 2 days     Minutes of Exercise per Session: 60 min  Stress: No Stress Concern Present (05/28/2022)    [employer] of Occupational Health - Occupational Stress Questionnaire     Feeling of Stress : Not at all  Social Connections: Moderately Integrated (05/28/2022)    Social Connection and Isolation Panel [NHANES]     Frequency of Communication with Friends and Family: More than three times a week     Frequency of Social Gatherings with Friends and Family: More than three times a week     Attends Religious Services: More than 4 times per year     Active Member of [employer] or  Organizations: Yes     Attends Engineer, structural: More than 4 times per year     Marital Status: Divorced         Family History  Problem Relation Age of Onset   Heart disease Mother 36   COPD Mother     Crohn's disease Mother     Heart disease Father 29   Cancer Father          kidney   Colon cancer Neg Hx     Esophageal cancer Neg Hx     Stomach cancer Neg Hx     Rectal cancer Neg Hx          Allergies       Allergies  Allergen Reactions   Beeswax        Per skin test from dermatologist   Cetrimonium Chloride [Cetrimide]        Per skin test from dermatologist   Methylisothiazolinone        Per skin test from dermatologist   Neomycin Sulfate [Neomycin]        Per skin test from dermatologist   Nickel Sulfate [Nickel]        Per skin test from dermatologist   Penicillins        Childhood Reaction    Propolis        Per skin test from dermatologist            Current Outpatient Medications  Medication Sig Dispense Refill   amLODipine (NORVASC) 2.5 MG tablet Take 1 tablet (2.5 mg total) by mouth daily. 90 tablet 3   aspirin EC 325 MG tablet Take 1 tablet (325 mg total) by mouth daily. 30 tablet 0   Biotin w/ Vitamins C & E (HAIR SKIN & NAILS GUMMIES PO) Take 2 each by mouth daily.       Calcium Carb-Cholecalciferol (CALCIUM 600+D3 PO) Take 1 tablet by mouth daily.       cyanocobalamin (VITAMIN B12) 1000 MCG tablet Take 1,000 mcg by mouth daily.       Ginkgo Biloba 60 MG CAPS Take 60 mg by mouth daily.       lisinopril-hydrochlorothiazide (ZESTORETIC) 20-12.5 MG tablet Take 1  tablet by mouth daily. 90 tablet 3   Multiple Vitamin (MULTI-VITAMIN) tablet Take 1 tablet by mouth daily.       omeprazole (PRILOSEC) 40 MG capsule Take 1 capsule (40 mg total) by mouth daily. 90 capsule 3   oxyCODONE (ROXICODONE) 5 MG immediate release tablet Take 1 tablet (5 mg total) by mouth every 4 (four) hours as needed for severe pain or breakthrough pain. 10 tablet 0    Probiotic Product (PROBIOTIC DAILY PO) Take 1 capsule by mouth daily. (Patient not taking: Reported on 10/18/2022)       vitamin C (ASCORBIC ACID) 500 MG tablet Take 500 mg by mouth daily.       VYZULTA 0.024 % SOLN Place 1 drop into both eyes at bedtime.          No current facility-administered medications for this visit.      Imaging Results (Last 48 hours)  No results found.     Review of Systems:   A ROS was performed including pertinent positives and negatives as documented in the HPI.     Musculoskeletal Exam:     There were no vitals taken for this visit.   Left shoulder incisions are healed.  Active forward elevation is to 30 degrees with external rotation at side to neutral.  Internal rotation is limited to side.  She has pain with positive drop arm test   Right shoulder active forward elevation is to 170 degrees.  External rotation at side is to 50 degrees internal rotation is to T12.  Positive Neer impingement  CV: No m/g/r  Pulm: CTAB   Imaging:       MRI left shoulder: There is a full-thickness read tear with retraction of the supraspinatus at the musculotendinous junction of the left shoulder   I personally reviewed and interpreted the radiographs.     Assessment:   She is now status post left rotator cuff repair and biceps tenodesis unfortunately with significant pain and weakness with overhead activity after her dog jerked her left arm when going for a walk.  She presents today with a full-thickness tear at the musculotendinous junction of her repair.  Unfortunately given the fact that there is significant involvement and retraction I do believe that a redo repair would be unlikely to provide a significant relief and recovery.  At this time we discussed treatment options.  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  Given that we did discuss additional treatment options.  Specifically I do believe that reverse shoulder arthroplasty would give her the longest term  outcome relief.  At this time she would like to proceed with this.   She is also requesting a right shoulder ultrasound-guided injection and this was performed in the subacromial space today after verbal consent was obtained Plan :     -Plan for left shoulder reverse shoulder arthroplasty   Right shoulder ultrasound-guided subacromial injection after verbal consent obtained

## 2022-12-16 NOTE — Discharge Instructions (Addendum)
Discharge Instructions    Attending Surgeon: Huel Cote, MD Office Phone Number: [phone]   Diagnosis and Procedures:    Surgeries Performed: Left reverse shoulder arthroplasty  Discharge Plan:    Diet: Resume usual diet. Begin with light or bland foods.  Drink plenty of fluids.  Activity:  Keep sling and dressing in place until your follow up visit in Physical Therapy You are advised to go home directly from the hospital or surgical center. Restrict your activities.  GENERAL INSTRUCTIONS: 1.  Keep your surgical site elevated above your heart for at least 5-7 days or longer to prevent swelling. This will improve your comfort and your overall recovery following surgery.     2. Please call Dr. Serena Croissant office at [phone] with questions Monday-Friday during business hours. If no one answers, please leave a message and someone should get back to the patient within 24 hours. For emergencies please call 911 or proceed to the emergency room.   3. Patient to notify surgical team if experiences any of the following: Bowel/Bladder dysfunction, uncontrolled pain, nerve/muscle weakness, incision with increased drainage or redness, nausea/vomiting and Fever greater than 101.0 F.  Be alert for signs of infection including redness, streaking, odor, fever or chills. Be alert for excessive pain or bleeding and notify your surgeon immediately.  WOUND INSTRUCTIONS:   Leave your dressing/cast/splint in place until your post operative visit.  Keep it clean and dry.  Always keep the incision clean and dry until the staples/sutures are removed. If there is no drainage from the incision you should keep it open to air. If there is drainage from the incision you must keep it covered at all times until the drainage stops  Do not soak in a bath tub, hot tub, pool, lake or other body of water until 21 days after your surgery and your incision is completely dry and healed.  If you have  removable sutures (or staples) they must be removed 10-14 days (unless otherwise instructed) from the day of your surgery.     1)  Elevate the extremity as much as possible.  2)  Keep the dressing clean and dry.  3)  Please call us if the dressing becomes wet or dirty.  4)  If you are experiencing worsening pain or worsening swelling, please call.     MEDICATIONS: Resume all previous home medications at the previous prescribed dose and frequency unless otherwise noted Start taking the  pain medications on an as-needed basis as prescribed  Please taper down pain medication over the next week following surgery.  Ideally you should not require a refill of any narcotic pain medication.  Take pain medication with food to minimize nausea. In addition to the prescribed pain medication, you may take over-the-counter pain relievers such as Tylenol.  Do NOT take additional tylenol if your pain medication already has tylenol in it.  Aspirin 325mg  daily for four weeks.      FOLLOWUP INSTRUCTIONS: 1. Follow up at the Physical Therapy Clinic 3-4 days following surgery. This appointment should be scheduled unless other arrangements have been made.The Physical Therapy scheduling number is [phone] if an appointment has not already been arranged.  2. Contact Dr. Serena Croissant office during office hours at [phone] or the practice after hours line at [phone] for non-emergencies. For medical emergencies call 911.   Discharge Location: Home   AMBULATORY SURGERY  DISCHARGE INSTRUCTIONS   The drugs that you were given will stay in your system until  tomorrow so for the next 24 hours you should not:  Drive an automobile Make any legal decisions Drink any alcoholic beverage   You may resume regular meals tomorrow.  Today it is better to start with liquids and gradually work up to solid foods.  You may eat anything you prefer, but it is better to start with liquids, then soup and crackers,  and gradually work up to solid foods.   Please notify your doctor immediately if you have any unusual bleeding, trouble breathing, redness and pain at the surgery site, drainage, fever, or pain not relieved by medication.    Additional Instructions: PLEASE LEAVE TEAL BRACELET ON FOR 4 DAYS        Please contact your physician with any problems or Same Day Surgery at [phone], Monday through Friday 6 am to 4 pm, or [facility] number at [phone].

## 2022-12-16 NOTE — Anesthesia Procedure Notes (Addendum)
Procedure Name: Intubation Date/Time: 12/16/2022 12:32 PM  Performed by: Mohammed Kindle, CRNAPre-anesthesia Checklist: Patient identified, Emergency Drugs available, Suction available and Patient being monitored Patient Re-evaluated:Patient Re-evaluated prior to induction Oxygen Delivery Method: Circle system utilized Preoxygenation: Pre-oxygenation with 100% oxygen Induction Type: IV induction Ventilation: Mask ventilation without difficulty Laryngoscope Size: McGraph and 3 Grade View: Grade I Tube type: Oral Tube size: 6.5 mm Number of attempts: 1 Airway Equipment and Method: Stylet and Oral airway Placement Confirmation: ETT inserted through vocal cords under direct vision, positive ETCO2, breath sounds checked- equal and bilateral and CO2 detector Secured at: 21 cm Tube secured with: Tape Dental Injury: Teeth and Oropharynx as per pre-operative assessment

## 2022-12-16 NOTE — Interval H&P Note (Signed)
History and Physical Interval Note:  12/16/2022 11:52 AM  Heidi Shah  has presented today for surgery, with the diagnosis of LEFT ROTATOR CUFF ARTHROPLASTY.  The various methods of treatment have been discussed with the patient and family. After consideration of risks, benefits and other options for treatment, the patient has consented to  Procedure(s): LEFT REVERSE SHOULDER ARTHROPLASTY (Left) as a surgical intervention.  The patient's history has been reviewed, patient examined, no change in status, stable for surgery.  I have reviewed the patient's chart and labs.  Questions were answered to the patient's satisfaction.     Huel Cote

## 2022-12-17 ENCOUNTER — Ambulatory Visit (HOSPITAL_BASED_OUTPATIENT_CLINIC_OR_DEPARTMENT_OTHER): Payer: Medicare HMO | Admitting: Orthopaedic Surgery

## 2022-12-17 ENCOUNTER — Encounter (HOSPITAL_BASED_OUTPATIENT_CLINIC_OR_DEPARTMENT_OTHER): Payer: Self-pay | Admitting: Orthopaedic Surgery

## 2022-12-17 DIAGNOSIS — Z96612 Presence of left artificial shoulder joint: Secondary | ICD-10-CM

## 2022-12-17 LAB — SURGICAL PATHOLOGY

## 2022-12-17 NOTE — Progress Notes (Signed)
Patient seen and educated on sling placement.  Will return to clinic for regularly scheduled 2-week postop

## 2022-12-17 NOTE — Anesthesia Postprocedure Evaluation (Signed)
Anesthesia Post Note  Patient: Kaveah Carruth Kahl  Procedure(s) Performed: LEFT REVERSE SHOULDER ARTHROPLASTY (Left: Shoulder)  Patient location during evaluation: PACU Anesthesia Type: General Level of consciousness: awake and alert Pain management: pain level controlled Vital Signs Assessment: post-procedure vital signs reviewed and stable Respiratory status: spontaneous breathing, nonlabored ventilation, respiratory function stable and patient connected to nasal cannula oxygen Cardiovascular status: blood pressure returned to baseline and stable Postop Assessment: no apparent nausea or vomiting Anesthetic complications: no   No notable events documented.   Last Vitals:  Vitals:   12/16/22 1600 12/16/22 1615  BP: 99/66 94/64  Pulse: 89 (!) 102  Resp: 15 17  Temp: (!) 36.3 C   SpO2: 94% 93%    Last Pain:  Vitals:   12/16/22 1600  TempSrc:   PainSc: 0-No pain                 Louie Boston

## 2022-12-17 NOTE — Progress Notes (Signed)
S/p 1 day L RSA. Patient presents today to troubleshoot iceman machine and adjust shoulder immobilizer. Overall doing well, pain well controlled and nerve block has not worn off yet.   Sling was adjusted to comfort and function.   No further questions asked.

## 2022-12-20 ENCOUNTER — Encounter: Payer: Self-pay | Admitting: Orthopaedic Surgery

## 2022-12-22 ENCOUNTER — Telehealth: Payer: Self-pay | Admitting: *Deleted

## 2022-12-22 NOTE — Telephone Encounter (Signed)
Call to patient and discussed status after surgery on 12/16/22. Doing well overall and not really having much pain, described as soreness. Post op appointment with Dr. Steward Drone on 12/27/22 and OPPT to start same day.

## 2022-12-23 ENCOUNTER — Ambulatory Visit (HOSPITAL_BASED_OUTPATIENT_CLINIC_OR_DEPARTMENT_OTHER): Payer: Medicare HMO | Admitting: Physical Therapy

## 2022-12-26 NOTE — Progress Notes (Signed)
OUTPATIENT PHYSICAL THERAPY SHOULDER EVALUATION   Patient Name: Heidi Shah MRN: [id number] DOB:[birth year], 77 y.o., female Today's Date: 12/27/2022  END OF SESSION:  PT End of Session - 12/27/22 1505     Visit Number 1    Number of Visits 29    Date for PT Re-Evaluation 03/21/23    Authorization Type humana MCR    PT Start Time 1457    PT Stop Time 1541    PT Time Calculation (min) 44 min    Activity Tolerance Patient tolerated treatment well    Behavior During Therapy WFL for tasks assessed/performed             Past Medical History:  Diagnosis Date   Allergy    Anxiety    Arthritis    Borderline glaucoma    Cataract    surgery - Bilateral cataracts removed   Cervical spondylolysis    Chicken pox    Colon polyps    Depression    Diverticulitis    Diverticulosis    Esophageal spasm    Esophageal stricture    Family history of adverse reaction to anesthesia    difficult time waking mother up   GERD (gastroesophageal reflux disease)    Heart murmur    Hypertension    Osteoporosis    Traumatic complete tear of left rotator cuff    Past Surgical History:  Procedure Laterality Date   cataract surgery Bilateral 1996   COLONOSCOPY  2022   EYE SURGERY  2000   detached L retina   HERNIA REPAIR Bilateral 1990   inguinal   NECK SURGERY     age 23-trimmed a muscle due to not being able to hold her head up   POLYPECTOMY  2022   REVERSE SHOULDER ARTHROPLASTY Left 12/16/2022   Procedure: LEFT REVERSE SHOULDER ARTHROPLASTY;  Surgeon: Huel Cote, MD;  Location: [facility] OR[facility];  Service: Orthopedics;  Laterality: Left;   SHOULDER ARTHROSCOPY WITH ROTATOR CUFF REPAIR AND OPEN BICEPS TENODESIS Left 12/10/2021   Procedure: LEFT SHOULDER ARTHROSCOPY WITH ROTATOR CUFF REPAIR AND OPEN BICEPS TENODESIS;  Surgeon: Huel Cote, MD;  Location: [facility];  Service: Orthopedics;  Laterality: Left;   UPPER GASTROINTESTINAL ENDOSCOPY  2022   Patient Active Problem List    Diagnosis Date Noted   Traumatic complete tear of left rotator cuff    Cervical spondylosis with myelopathy and radiculopathy 03/14/2020   Neck pain 03/14/2020   Aortic heart murmur 02/25/2020   Osteoporosis 04/04/2019   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   Unspecified constipation 03/05/2013   GERD (gastroesophageal reflux disease) 01/19/2013   Esophageal spasm 01/19/2013   Dysphagia, unspecified(787.20) 01/19/2013   Diverticulitis of colon (without mention of hemorrhage)(562.11) 11/23/2012   History of depression 11/23/2012   Essential hypertension, benign 11/23/2012     REFERRING PROVIDER: Huel Cote, MD  REFERRING DIAG: S46.012A (ICD-10-CM) - Traumatic complete tear of left rotator cuff, initial encounter   S/p  THERAPY DIAG:  Left shoulder pain, unspecified chronicity  Stiffness of left shoulder, not elsewhere classified  Muscle weakness (generalized)  Rationale for Evaluation and Treatment: Rehabilitation  ONSET DATE: DOS 12/16/2022  SUBJECTIVE:  SUBJECTIVE STATEMENT: Pt underwent L shoulder RCR and biceps tenodesis in 11/2021.  In June of this year she was walking her dog when her dog pulled on the leash which jerked her arm.  Pt had a MRI which showed a full thickness tear of the supraspinatus with retraction.  Pt underwent L RSA on 12/16/2022.  Pt saw MD and had x rays which looked good.  He removed her stitches.  Pt states MD informed her she doesn't have to wear the sling except for sleeping and to not perform any lifting.    Pt is limited with performing her ADLs/IADLs including dressing and bathing.  Pt unable to wash her hair with L UE.  Pt is unable to perform reaching activities.    Hand dominance: Right  PERTINENT HISTORY: L shoulder reverse shoulder arthroplasty (RSA) and biceps  tenodesis on 12/16/2022.  L shoulder RCR and biceps tenodesis in 11/2021. Arthritis, cervical spondylosis, osteoporosis, HTN Pt states she does have 2 small tears in her R shoulder and does some pain in R shoulder.    PAIN:  NPRS:  1-2/10 current, 2/10 worst, 0/10 best Pt states she can easily get rid of the pain with taking Ibuprofen and acetaminophen.  PRECAUTIONS: {Therapy precautions:24002}    WEIGHT BEARING RESTRICTIONS: Yes L UE  FALLS:  Has patient fallen in last 6 months? No  LIVING ENVIRONMENT: Lives with: lives alone Lives in: 1 story home Stairs: has steps to enter home and bilat rails on one entrance   OCCUPATION: Pt is retired.  PLOF: {PLOF:24004}  PATIENT GOALS:to regain 100% movement in arm and use it as normal   OBJECTIVE:   DIAGNOSTIC FINDINGS:  Pt is post op.    PATIENT SURVEYS:  FOTO 41 with a goal of 57 at visit 16  COGNITION: Overall cognitive status: Within functional limits for tasks assessed      OBSERVATION: Steri strips over incision.  No signs of infection.   UPPER EXTREMITY ROM:    ROM Right eval Left eval  Shoulder flexion 164 AROM PROM: 85  Shoulder scaption 158 AROM   Shoulder abduction    Shoulder adduction    Shoulder internal rotation    Shoulder external rotation  PROM: 21  Elbow flexion    Elbow extension    Wrist flexion    Wrist extension    Wrist ulnar deviation    Wrist radial deviation    Wrist pronation    Wrist supination    (Blank rows = not tested)  UPPER EXTREMITY MMT:  Not tested due to healing constraints and surgical protocol     TODAY'S TREATMENT:                                                                                                                                          Pt performed pendulums cw and ccw 2x10 each, wrist flex and ext AROM  2x10, hand pumps 2x10.  Pt received a HEP handout and was educated in correct form and appropriate frequency.  PT instructed pt she should not  have pain with HEP.  PATIENT EDUCATION: Education details: *** Person educated: {Person educated:25204} Education method: {Education Method:25205} Education comprehension: {Education Comprehension:25206}  HOME EXERCISE PROGRAM: Access Code: [email or web address] URL: [email or web address] Date: 12/27/2022 Prepared by: Aaron Edelman  Exercises - Circular Shoulder Pendulum with Table Support  - 1 x daily - 2-3 x weekly - 2 sets - 10 reps - Wrist AROM Flexion Extension  - 3 x daily - 7 x weekly - 2-3 sets - 10 reps - Hand pumps  3x/day, 2-3 sets of 10 reps  ASSESSMENT:  CLINICAL IMPRESSION: Patient is a 77 y.o. female 1 week and 4 days s/p L RSA and biceps tenodesis   OBJECTIVE IMPAIRMENTS: {opptimpairments:25111}.   ACTIVITY LIMITATIONS: {activitylimitations:27494}  PARTICIPATION LIMITATIONS: {participationrestrictions:25113}  PERSONAL FACTORS: {Personal factors:25162} are also affecting patient's functional outcome.   REHAB POTENTIAL: Good  CLINICAL DECISION MAKING: Stable/uncomplicated  EVALUATION COMPLEXITY: Low   GOALS:  SHORT TERM GOALS: Target date: ***  *** Baseline: Goal status: INITIAL  2.  *** Baseline:  Goal status: INITIAL  3.  *** Baseline:  Goal status: INITIAL  4.  *** Baseline:  Goal status: INITIAL  5.  *** Baseline:  Goal status: INITIAL  6.  *** Baseline:  Goal status: INITIAL  LONG TERM GOALS: Target date: ***  *** Baseline:  Goal status: INITIAL  2.  *** Baseline:  Goal status: INITIAL  3.  *** Baseline:  Goal status: INITIAL  4.  *** Baseline:  Goal status: INITIAL  5.  *** Baseline:  Goal status: INITIAL  6.  *** Baseline:  Goal status: INITIAL  PLAN:  PT FREQUENCY: {rehab frequency:25116}  PT DURATION: {rehab duration:25117}  PLANNED INTERVENTIONS: {rehab planned interventions:25118::"Therapeutic exercises","Therapeutic activity","Neuromuscular re-education","Balance training","Gait  training","Patient/Family education","Self Care","Joint mobilization"}  PLAN FOR NEXT SESSION: Aaron Edelman, PT 12/27/2022, 11:48 PM

## 2022-12-27 ENCOUNTER — Ambulatory Visit (HOSPITAL_BASED_OUTPATIENT_CLINIC_OR_DEPARTMENT_OTHER): Payer: Medicare HMO | Attending: Orthopaedic Surgery | Admitting: Physical Therapy

## 2022-12-27 ENCOUNTER — Ambulatory Visit (INDEPENDENT_AMBULATORY_CARE_PROVIDER_SITE_OTHER): Payer: Medicare HMO | Admitting: Orthopaedic Surgery

## 2022-12-27 ENCOUNTER — Other Ambulatory Visit: Payer: Self-pay

## 2022-12-27 ENCOUNTER — Ambulatory Visit (INDEPENDENT_AMBULATORY_CARE_PROVIDER_SITE_OTHER): Payer: Medicare HMO

## 2022-12-27 DIAGNOSIS — Z96612 Presence of left artificial shoulder joint: Secondary | ICD-10-CM

## 2022-12-27 DIAGNOSIS — M6281 Muscle weakness (generalized): Secondary | ICD-10-CM

## 2022-12-27 DIAGNOSIS — M25512 Pain in left shoulder: Secondary | ICD-10-CM

## 2022-12-27 DIAGNOSIS — X58XXXA Exposure to other specified factors, initial encounter: Secondary | ICD-10-CM | POA: Diagnosis not present

## 2022-12-27 DIAGNOSIS — M25612 Stiffness of left shoulder, not elsewhere classified: Secondary | ICD-10-CM

## 2022-12-27 DIAGNOSIS — S46012A Strain of muscle(s) and tendon(s) of the rotator cuff of left shoulder, initial encounter: Secondary | ICD-10-CM | POA: Diagnosis not present

## 2022-12-27 DIAGNOSIS — R609 Edema, unspecified: Secondary | ICD-10-CM | POA: Diagnosis not present

## 2022-12-27 DIAGNOSIS — Z471 Aftercare following joint replacement surgery: Secondary | ICD-10-CM | POA: Diagnosis not present

## 2022-12-27 NOTE — Progress Notes (Signed)
Post Operative Evaluation    Procedure/Date of Surgery: Left shoulder reverse shoulder arthroplasty 9/19  Interval History:   2 weeks status post the above procedure.  Overall she is doing very well.  She does have an appointment for osteoporosis workup.   PMH/PSH/Family History/Social History/Meds/Allergies:    Past Medical History:  Diagnosis Date   Allergy    Anxiety    Arthritis    Borderline glaucoma    Cataract    surgery - Bilateral cataracts removed   Cervical spondylolysis    Chicken pox    Colon polyps    Depression    Diverticulitis    Diverticulosis    Esophageal spasm    Esophageal stricture    Family history of adverse reaction to anesthesia    difficult time waking mother up   GERD (gastroesophageal reflux disease)    Heart murmur    Hypertension    Osteoporosis    Traumatic complete tear of left rotator cuff    Past Surgical History:  Procedure Laterality Date   cataract surgery Bilateral 1996   COLONOSCOPY  2022   EYE SURGERY  2000   detached L retina   HERNIA REPAIR Bilateral 1990   inguinal   NECK SURGERY     age 46-trimmed a muscle due to not being able to hold her head up   POLYPECTOMY  2022   REVERSE SHOULDER ARTHROPLASTY Left 12/16/2022   Procedure: LEFT REVERSE SHOULDER ARTHROPLASTY;  Surgeon: Huel Cote, MD;  Location: [facility];  Service: Orthopedics;  Laterality: Left;   SHOULDER ARTHROSCOPY WITH ROTATOR CUFF REPAIR AND OPEN BICEPS TENODESIS Left 12/10/2021   Procedure: LEFT SHOULDER ARTHROSCOPY WITH ROTATOR CUFF REPAIR AND OPEN BICEPS TENODESIS;  Surgeon: Huel Cote, MD;  Location: [facility];  Service: Orthopedics;  Laterality: Left;   UPPER GASTROINTESTINAL ENDOSCOPY  2022   Social History   Socioeconomic History   Marital status: Divorced    Spouse name: Not on file   Number of children: 2   Years of education: master's degree   Highest education level: Master's degree (e.g., MA, MS,  MEng, MEd, MSW, MBA)  Occupational History   Occupation: retired Runner, broadcasting/film/video  Tobacco Use   Smoking status: Never   Smokeless tobacco: Never  Vaping Use   Vaping status: Never Used  Substance and Sexual Activity   Alcohol use: Yes    Alcohol/week: 1.0 standard drink of alcohol    Types: 1 Glasses of wine per week    Comment: one or two glasses of wine a couple times a week   Drug use: No   Sexual activity: Not on file  Other Topics Concern   Not on file  Social History Narrative      Lives alone in ranch home.    2 children   Son lives in [place]   Continues to teach virtually part time   Social Determinants of Health   Financial Resource Strain: Low Risk  (12/03/2022)   Overall Financial Resource Strain (CARDIA)    Difficulty of Paying Living Expenses: Not hard at all  Food Insecurity: No Food Insecurity (12/03/2022)   Hunger Vital Sign    Worried About Running Out of Food in the Last Year: Never true    Ran Out of Food in the Last Year: Never true  Transportation  Needs: No Transportation Needs (12/03/2022)   PRAPARE - Administrator, Civil Service (Medical): No    Lack of Transportation (Non-Medical): No  Physical Activity: Inactive (12/03/2022)   Exercise Vital Sign    Days of Exercise per Week: 0 days    Minutes of Exercise per Session: 60 min  Stress: No Stress Concern Present (12/03/2022)   [employer] of Occupational Health - Occupational Stress Questionnaire    Feeling of Stress : Not at all  Social Connections: Moderately Integrated (12/03/2022)   Social Connection and Isolation Panel [NHANES]    Frequency of Communication with Friends and Family: More than three times a week    Frequency of Social Gatherings with Friends and Family: Once a week    Attends Religious Services: More than 4 times per year    Active Member of [employer] or Organizations: Yes    Attends Engineer, structural: More than 4 times per year    Marital Status: Divorced    Family History  Problem Relation Age of Onset   Heart disease Mother 66   COPD Mother    Crohn's disease Mother    Heart disease Father 41   Cancer Father        kidney   Colon cancer Neg Hx    Esophageal cancer Neg Hx    Stomach cancer Neg Hx    Rectal cancer Neg Hx    Allergies  Allergen Reactions   Nickel Sulfate [Nickel] Rash    Per skin test from dermatologist   Beeswax     Per skin test from dermatologist   Cetrimonium Chloride [Cetrimide]     Per skin test from dermatologist   Lactose Intolerance (Gi) Diarrhea   Methylisothiazolinone     Per skin test from dermatologist   Neomycin Sulfate [Neomycin]     Per skin test from dermatologist   Penicillins     Childhood Reaction    Propolis     Per skin test from dermatologist   Current Outpatient Medications  Medication Sig Dispense Refill   amLODipine (NORVASC) 2.5 MG tablet Take 1 tablet (2.5 mg total) by mouth daily. (Patient taking differently: Take 2.5 mg by mouth every morning.) 90 tablet 3   Biotin w/ Vitamins C & E (HAIR SKIN & NAILS GUMMIES PO) Take 2 each by mouth 4 (four) times a week.     Calcium Carb-Cholecalciferol (CALCIUM 600+D3 PO) Take 1 tablet by mouth daily.     cetirizine (ZYRTEC) 10 MG chewable tablet Chew 10 mg by mouth every morning.     cyanocobalamin (VITAMIN B12) 1000 MCG tablet Take 1,000 mcg by mouth daily.     Ginkgo Biloba 60 MG CAPS Take 60 mg by mouth daily.     lisinopril-hydrochlorothiazide (ZESTORETIC) 20-12.5 MG tablet Take 1 tablet by mouth daily. (Patient taking differently: Take 1 tablet by mouth every morning.) 90 tablet 3   Multiple Vitamin (MULTI-VITAMIN) tablet Take 1 tablet by mouth 4 (four) times a week.     omeprazole (PRILOSEC) 40 MG capsule Take 1 capsule (40 mg total) by mouth daily. (Patient taking differently: Take 40 mg by mouth every morning.) 90 capsule 3   oxyCODONE (ROXICODONE) 5 MG immediate release tablet Take 1 tablet (5 mg total) by mouth every 4 (four) hours  as needed for severe pain or breakthrough pain. (Patient not taking: Reported on 12/13/2022) 10 tablet 0   vitamin C (ASCORBIC ACID) 500 MG tablet Take 500 mg by mouth 4 (four)  times a week.     VYZULTA 0.024 % SOLN Place 1 drop into both eyes at bedtime.     No current facility-administered medications for this visit.   No results found.  Review of Systems:   A ROS was performed including pertinent positives and negatives as documented in the HPI.   Musculoskeletal Exam:    There were no vitals taken for this visit.  Left incision is well-appearing without erythema or drainage.  In the spine position she is able to forward elevate to 90 with external rotation at the side to 20 actively.  Internal rotation deferred today.  Distal neurosensory exam is intact  Imaging:    2 views left shoulder: Status post reverse shoulder arthroplasty without evidence of complication  I personally reviewed and interpreted the radiographs.   Assessment:   2-week status post left shoulder reverse shoulder arthroplasty without evidence of complication.  She will continue to advance according to the reverse shoulder repair protocol  Plan :    -She will return to clinic in 4 weeks for reassessment      I personally saw and evaluated the patient, and participated in the management and treatment plan.  Huel Cote, MD Attending Physician, Orthopedic Surgery  This document was dictated using Dragon voice recognition software. A reasonable attempt at proof reading has been made to minimize errors.

## 2022-12-28 NOTE — Addendum Note (Signed)
Addended by: Susy Manor on: 12/28/2022 12:58 PM   Modules accepted: Orders

## 2022-12-30 ENCOUNTER — Encounter (HOSPITAL_BASED_OUTPATIENT_CLINIC_OR_DEPARTMENT_OTHER): Payer: Medicare HMO | Admitting: Physical Therapy

## 2022-12-31 ENCOUNTER — Ambulatory Visit (HOSPITAL_BASED_OUTPATIENT_CLINIC_OR_DEPARTMENT_OTHER): Payer: Medicare HMO | Admitting: Physical Therapy

## 2023-01-01 ENCOUNTER — Encounter (HOSPITAL_BASED_OUTPATIENT_CLINIC_OR_DEPARTMENT_OTHER): Payer: Self-pay | Admitting: Physical Therapy

## 2023-01-01 ENCOUNTER — Ambulatory Visit (HOSPITAL_BASED_OUTPATIENT_CLINIC_OR_DEPARTMENT_OTHER): Payer: Medicare HMO | Attending: Orthopaedic Surgery | Admitting: Physical Therapy

## 2023-01-01 DIAGNOSIS — M6281 Muscle weakness (generalized): Secondary | ICD-10-CM | POA: Insufficient documentation

## 2023-01-01 DIAGNOSIS — M25512 Pain in left shoulder: Secondary | ICD-10-CM | POA: Diagnosis not present

## 2023-01-01 DIAGNOSIS — M25612 Stiffness of left shoulder, not elsewhere classified: Secondary | ICD-10-CM | POA: Diagnosis not present

## 2023-01-01 NOTE — Therapy (Signed)
OUTPATIENT PHYSICAL THERAPY TREATMENT  Patient Name: Heidi Shah MRN: [id number] DOB:04-Jul-1945, 77 y.o., female Today's Date: 01/01/2023  END OF SESSION:  PT End of Session - 01/01/23 1047     Visit Number 2    Number of Visits 29    Date for PT Re-Evaluation 03/21/23    Authorization Type human[employer] MCR    PT Start Time 1047    PT Stop Time 1125    PT Time Calculation (min) 38 min    Activity Tolerance Patient tolerated treatment well    Behavior During Therapy WFL for tasks assessed/performed             Past Medical History:  Diagnosis Date   Allergy    Anxiety    Arthritis    Borderline glaucoma    Cataract    surgery - Bilateral cataracts removed   Cervical spondylolysis    Chicken pox    Colon polyps    Depression    Diverticulitis    Diverticulosis    Esophageal spasm    Esophageal stricture    Family history of adverse reaction to anesthesia    difficult time waking mother up   GERD (gastroesophageal reflux disease)    Heart murmur    Hypertension    Osteoporosis    Traumatic complete tear of left rotator cuff    Past Surgical History:  Procedure Laterality Date   cataract surgery Bilateral 1996   COLONOSCOPY  2022   EYE SURGERY  2000   detached L retina   HERNIA REPAIR Bilateral 1990   inguinal   NECK SURGERY     age 89-trimmed a muscle due to not being able to hold her head up   POLYPECTOMY  2022   REVERSE SHOULDER ARTHROPLASTY Left 12/16/2022   Procedure: LEFT REVERSE SHOULDER ARTHROPLASTY;  Surgeon: Huel Cote, MD;  Location: [facility] OR[facility];  Service: Orthopedics;  Laterality: Left;   SHOULDER ARTHROSCOPY WITH ROTATOR CUFF REPAIR AND OPEN BICEPS TENODESIS Left 12/10/2021   Procedure: LEFT SHOULDER ARTHROSCOPY WITH ROTATOR CUFF REPAIR AND OPEN BICEPS TENODESIS;  Surgeon: Huel Cote, MD;  Location: [facility];  Service: Orthopedics;  Laterality: Left;   UPPER GASTROINTESTINAL ENDOSCOPY  2022   Patient Active Problem List   Diagnosis Date  Noted   Traumatic complete tear of left rotator cuff    Cervical spondylosis with myelopathy and radiculopathy 03/14/2020   Neck pain 03/14/2020   Aortic heart murmur 02/25/2020   Osteoporosis 04/04/2019   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   Constipation 03/05/2013   GERD (gastroesophageal reflux disease) 01/19/2013   Esophageal spasm 01/19/2013   Dysphagia 01/19/2013   Diverticulitis of colon (without mention of hemorrhage)(562.11) 11/23/2012   History of depression 11/23/2012   Essential hypertension, benign 11/23/2012     THERAPY DIAG:  Left shoulder pain, unspecified chronicity  Stiffness of left shoulder, not elsewhere classified  Muscle weakness (generalized)  REFERRING PROVIDER: Huel Cote, MD   REFERRING DIAG: S46.012A (ICD-10-CM) - Traumatic complete tear of left rotator cuff, initial encounter    S/p L RSA   THERAPY DIAG:  Left shoulder pain, unspecified chronicity   Stiffness of left shoulder, not elsewhere classified   Muscle weakness (generalized)   Rationale for Evaluation and Treatment: Rehabilitation   ONSET DATE: DOS 12/16/2022   SUBJECTIVE:  SUBJECTIVE STATEMENT: Patient states says shoulder is sore. Likely sore from being out of sling. HEP going well.   EVAL: Pt underwent L shoulder RCR and biceps tenodesis in 11/2021.  In June of this year she was walking her dog when her dog pulled on the leash which jerked her arm.  Pt had a MRI which showed a full thickness tear of the supraspinatus with retraction.  Pt underwent L RSA on 12/16/2022.  Pt saw MD and had x rays which looked good.  He removed her stitches.  Pt states MD informed her she doesn't have to wear the sling except for sleeping and to not perform any lifting.     Pt is limited with performing her ADLs/IADLs  including dressing and bathing.  Pt unable to wash her hair with L UE.  Pt is unable to perform reaching activities.     Hand dominance: Right   PERTINENT HISTORY: -L shoulder reverse shoulder arthroplasty (RSA) and biceps tenodesis on 12/16/2022.  -L shoulder RCR and biceps tenodesis in 11/2021. -Arthritis, cervical spondylosis, osteoporosis, HTN -Pt states she does have 2 small tears in her R shoulder and does some pain in R shoulder.     PAIN:  NPRS:  0/10 current, 2/10 worst, 0/10 best Pt states she can easily get rid of the pain with taking Ibuprofen and acetaminophen.   PRECAUTIONS: Other: Tears and pain in R shoulder, osteoporosis       WEIGHT BEARING RESTRICTIONS: Yes L UE   FALLS:  Has patient fallen in last 6 months? No   LIVING ENVIRONMENT: Lives with: lives alone Lives in: 1 story home Stairs: has steps to enter home and bilat rails on one entrance     OCCUPATION: Pt is retired.   PLOF: Independent   PATIENT GOALS:  to regain 100% movement in arm and use it as normal     OBJECTIVE:    DIAGNOSTIC FINDINGS:  Pt is post op.     PATIENT SURVEYS:  FOTO 41 with a goal of 57 at visit 16   COGNITION: Overall cognitive status: Within functional limits for tasks assessed                                    OBSERVATION: Steri strips over incision.  No signs of infection.    UPPER EXTREMITY ROM:     ROM Right eval Left eval Left 01/01/23  Shoulder flexion 164 AROM PROM: 85 AAROM 92  Shoulder scaption 158 AROM     Shoulder abduction       Shoulder adduction       Shoulder internal rotation       Shoulder external rotation   PROM: 21 25  Elbow flexion       Elbow extension       Wrist flexion       Wrist extension       Wrist ulnar deviation       Wrist radial deviation       Wrist pronation       Wrist supination       (Blank rows = not tested)   UPPER EXTREMITY MMT:   Not tested due to healing constraints and surgical protocol                  TODAY'S TREATMENT:  01/01/23 PROM for L shoulder GHJ within limitations Supine shoulder flexion with cane 2 x 10 Supine AAROM ER with cane 2 x 10 Seated elbow flexion 2 x 10 Seated scap retractions 2 x 10 Seated flexion table slides 2 x 10 x 5-10 second holds Standing flexion walk back stretch 5 x 10 second holds     EVAL: Pt performed pendulums cw and ccw 2x10 each, wrist flex and ext AROM 2x10, hand pumps 2x10.  Pt received a HEP handout and was educated in correct form and appropriate frequency.  PT instructed pt she should not have pain with HEP.   PATIENT EDUCATION: Education details: surgical and protocol limitations and restrictions including to not allow arm to move behind her side and to not reach backward.  Dx, relevant anatomy, POC, HEP Person educated: Patient Education method: Explanation, Demonstration, Tactile cues, Verbal cues, and Handouts Education comprehension: verbalized understanding, returned demonstration, verbal cues required, tactile cues required, and needs further education   HOME EXERCISE PROGRAM: Access Code: [email or web address] URL: [email or web address] Date: 12/27/2022 Prepared by: Heidi Shah   Exercises - Circular Shoulder Pendulum with Table Support  - 1 x daily - 2-3 x weekly - 2 sets - 10 reps - Wrist AROM Flexion Extension  - 3 x daily - 7 x weekly - 2-3 sets - 10 reps - Hand pumps  3x/day, 2-3 sets of 10 reps  01/01/23- Seated Elbow Flexion and Extension AROM  - 1 x daily - 7 x weekly - 3 sets - 10 reps - Supine Shoulder Flexion Extension AAROM with Dowel  - 3 x daily - 7 x weekly - 2 sets - 10 reps - Supine Shoulder External Rotation with Dowel  - 3 x daily - 7 x weekly - 2 sets - 10 reps - Seated Scapular Retraction  - 3 x daily - 7 x weekly - 3 sets - 10 reps - Seated Shoulder Flexion Towel Slide at  Table Top (Mirrored)  - 3 x daily - 7 x weekly - 2 sets - 10 reps - 5-10 second hold - Standing 'L' Stretch at Counter  - 3 x daily - 7 x weekly - 2 sets - 10 reps - 5-10 second hold   ASSESSMENT:   CLINICAL IMPRESSION: Patient with ROM to 92 flexion at beginning of session and improves to 96 with manual and exercise. Began additional mobility exercises which are tolerated well. Minimal visible improvement in ROM during session but good mechanics with all exercises. Added to HEP. Patient will continue to benefit from physical therapy in order to improve function and reduce impairment.   EVAL: Patient is a 77 y.o. female 1 week and 4 days s/p L RSA and biceps tenodesis presenting to the clinic with expected post op findings of L shoulder pain, limited ROM in L shoulder, and muscle weakness in L UE.  Pt is limited with performing her ADLs/IADLs including dressing and bathing.  Pt is unable to perform reaching activities.  Pt reports MD has removed her sling except for sleeping.  Pt should benefit from skilled PT services per protocol to address impairments and improve overall function.       OBJECTIVE IMPAIRMENTS: decreased activity tolerance, decreased endurance, decreased ROM, decreased strength, hypomobility, impaired flexibility, impaired UE functional use, and pain.    ACTIVITY LIMITATIONS: carrying, lifting, bathing, dressing, reach over head, and hygiene/grooming   PARTICIPATION LIMITATIONS: meal prep, cleaning, laundry, driving, shopping, and community activity   PERSONAL FACTORS: 3+ comorbidities: cervical spondylosis, osteoporosis, R  shoulder pain  are also affecting patient's functional outcome.    REHAB POTENTIAL: Good   CLINICAL DECISION MAKING: Stable/uncomplicated   EVALUATION COMPLEXITY: Low     GOALS:   SHORT TERM GOALS:    Pt will be independent and compliant with HEP for improved pain, ROM, strength, and function  Baseline: Goal status: INITIAL Target date:   01/24/2023     2.  Pt will demo improved PROM to 110 deg in flexion and 30 deg in ER for improved shoulder mobility and tightness Baseline:  Goal status: INITIAL Target date:  01/17/2023       3.  Pt will demo L shoulder AAROM to 130 deg in flexion (supine) and 30 deg in ER for improved stiffness and mobility Baseline:  Goal status: INITIAL Target date:  01/31/2023     4.  Pt will be able to actively perform R shoulder flexion AROM to at least 90 deg in supine.  Baseline:  Goal status: INITIAL Target date:  01/24/2023     5.  Pt will be able to actively elevate R shoulder > 90 deg without significant shoulder hike.  Baseline:  Goal status: INITIAL Target date:  02/28/2023     6.  Pt will be able to perform her self care activities with no > than minimal difficulty.   Baseline:  Goal status: INITIAL Target date:  03/21/2023     LONG TERM GOALS: Target date:   04/18/2023     Pt will be able to perform her ADLs and IADLs without significant difficulty and pain.  Baseline:  Goal status: INITIAL   2.   Pt will demo L shoulder AROM to be [facility] t/o for performance of ADLs and IADLs  Baseline:  Goal status: INITIAL   3.  Pt will be able to perform her normal reaching and functional overhead activities without significant limitation and pain. Baseline:  Goal status: INITIAL   4.  Pt will be able to reach into an overhead cabinet without significant difficulty.  Baseline:  Goal status: INITIAL Target date:  04/04/2023     5.  Pt will progress with strengthening exercises per protocol without adverse effects in order to perform her normal functional lifting/carrying activities and household chores.  Baseline:  Goal status: INITIAL       PLAN:   PT FREQUENCY: 1-2x/week   PT DURATION: other: 16 weeks   PLANNED INTERVENTIONS: Therapeutic exercises, Therapeutic activity, Neuromuscular re-education, Patient/Family education, Self Care, Aquatic Therapy, Dry Needling,  Electrical stimulation, Cryotherapy, Moist heat, Taping, Manual therapy, and Re-evaluation   PLAN FOR NEXT SESSION: Cont per Dr. Suzan Nailer RSA protocol.     Heidi Shah, PT 01/01/2023, 10:50 AM

## 2023-01-03 ENCOUNTER — Encounter: Payer: Self-pay | Admitting: Physician Assistant

## 2023-01-03 ENCOUNTER — Ambulatory Visit: Payer: Medicare HMO | Admitting: Physician Assistant

## 2023-01-03 VITALS — BP 130/91 | HR 88 | Ht <= 58 in | Wt 118.0 lb

## 2023-01-03 DIAGNOSIS — M810 Age-related osteoporosis without current pathological fracture: Secondary | ICD-10-CM

## 2023-01-03 NOTE — Addendum Note (Signed)
Addended by: Michaele Offer on: 01/03/2023 11:11 AM   Modules accepted: Orders

## 2023-01-03 NOTE — Progress Notes (Signed)
Office Visit Note   Patient: Heidi Shah           Date of Birth: [birth year]           MRN: [id number] Visit Date: 01/03/2023              Requested by: Huel Cote, MD [address] [place],  [place] [address] PCP: Kristian Covey, MD   Assessment & Plan: Visit Diagnoses:  1. Age-related osteoporosis without current pathological fracture     Plan: Heidi Shah is a pleasant 77 year old woman who was referred by Dr. Steward Drone.  She recently has had shoulder replacement surgery and she was told that her bone was very "thin".  She also has had a height loss of 2-1/2 inches.  She was recently on Prolia shots a couple years ago after having a DEXA that showed her to be osteoporotic.  She discontinued the shots because she thought they cause her leg cramping and she was unable to get them paid for and they were too expensive.  Her last T-score was -3.  This was over 3 years ago.  She has not had any fractures in the past.  She has no history of heart attack disease or stroke although she has a heart murmur.  She has no history of cancer though her father had bone cancer.  She has no history of kidney disease peptic ulcer disease.  She has never been a smoker.  She does have a history of GERD and difficulty swallowing pills.  She was on Nexium for years but was taken off of this as it was thought that would be a cause of her osteoporosis.  No history of epilepsy or seizures.  She been through menopause at age 31.  As far as calcium she takes an allergy a calcium as she can tolerate this she is unsure of the milligrams.  She does take vitamin D again unsure of the dose but was at 1 point vitamin D deficient and was placed on high dose.  Has not had a vitamin D done in a couple years.  She was on hormone replacement therapy for several years following menopause.  She drinks 1 to 2 glasses of beverages a week does not do any strength training but does walk.  No history of dental issues  and does go to the dentist on a regular basis.  Based on this information I think she is at high risk for fracture.  Will need to get some updated labs and a new bone DEXA scan as she has not had one in 3 years.  We talked about given her activity level right now maximizing her calcium vitamin D and ways she could do this.  Also incorporating strength training into her regular routine.  Ideally based on the results given her low T-score most appropriate treatment would be with an anabolic such as Evenity.  Given her information to read this over.  After Evenity she could be switched to Prolia.  I do not think a bisphosphonate would be good for her orally because of her history of reflux and difficulty swallowing pills.  Given her low T-score I think simply doing Reclast once a year would not help her significantly.  She will follow-up once the labs and new bone DEXA scan are completed.  Spent over 30 to 45 minutes discussing the nature of osteoporosis reviewing her history as well appropriate labs and coming up with a treatment plan.  Follow-Up Instructions:  No follow-ups on file.   Orders:  No orders of the defined types were placed in this encounter.  No orders of the defined types were placed in this encounter.     Procedures: No procedures performed   Clinical Data: No additional findings.   Subjective: Chief Complaint  Patient presents with  . Osteoporosis    Patient is in to see [name] she was referred by [facility] for her bones per the patient     HPI patient is a pleasant 77 year old woman who is referred by Dr. Steward Drone for evaluation of osteoporosis.  She does have a history of osteoporosis and was on Prolia but had to stop this secondary to some side effects of leg cramping and financial constraints.  Her most recent DEXA was -3.  She has had no history of fracture but has lost height and recently had shoulder surgery and was told her bone was quite fragile per the Dr. Steward Drone.   She is very concerned as she likes to stay active and is concerned for fragility fracture  Review of Systems  All other systems reviewed and are negative.   Objective: Vital Signs: BP (!) 130/91   Pulse 88   Ht 4\' 10"  (1.473 m)   Wt 118 lb (53.5 kg)   BMI 24.66 kg/m   Physical Exam Constitutional:      Appearance: Normal appearance.  Pulmonary:     Effort: Pulmonary effort is normal.  Skin:    General: Skin is warm and dry.  Neurological:     General: No focal deficit present.     Mental Status: She is alert and oriented to person, place, and time.  Psychiatric:        Mood and Affect: Mood normal.        Behavior: Behavior normal.   Ortho Exam  Specialty Comments:  No specialty comments available.  Imaging: No results found.   PMFS History: Patient Active Problem List   Diagnosis Date Noted  . Traumatic complete tear of left rotator cuff   . Cervical spondylosis with myelopathy and radiculopathy 03/14/2020  . Neck pain 03/14/2020  . Aortic heart murmur 02/25/2020  . Age-related osteoporosis without current pathological fracture 04/04/2019  . 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  . Constipation 03/05/2013  . GERD (gastroesophageal reflux disease) 01/19/2013  . Esophageal spasm 01/19/2013  . Dysphagia 01/19/2013  . Diverticulitis of colon (without mention of hemorrhage)(562.11) 11/23/2012  . History of depression 11/23/2012  . Essential hypertension, benign 11/23/2012   Past Medical History:  Diagnosis Date  . Allergy   . Anxiety   . Arthritis   . Borderline glaucoma   . Cataract    surgery - Bilateral cataracts removed  . Cervical spondylolysis   . Chicken pox   . Colon polyps   . Depression   . Diverticulitis   . Diverticulosis   . Esophageal spasm   . Esophageal stricture   . Family history of adverse reaction to anesthesia    difficult time waking mother up  . GERD (gastroesophageal reflux disease)   . Heart murmur   . Hypertension   .  Osteoporosis   . Traumatic complete tear of left rotator cuff     Family History  Problem Relation Age of Onset  . Heart disease Mother 64  . COPD Mother   . Crohn's disease Mother   . Heart disease Father 41  . Cancer Father        kidney  . Colon cancer Neg Hx   .  Esophageal cancer Neg Hx   . Stomach cancer Neg Hx   . Rectal cancer Neg Hx     Past Surgical History:  Procedure Laterality Date  . cataract surgery Bilateral 1996  . COLONOSCOPY  2022  . EYE SURGERY  2000   detached L retina  . HERNIA REPAIR Bilateral 1990   inguinal  . NECK SURGERY     age 68-trimmed a muscle due to not being able to hold her head up  . POLYPECTOMY  2022  . REVERSE SHOULDER ARTHROPLASTY Left 12/16/2022   Procedure: LEFT REVERSE SHOULDER ARTHROPLASTY;  Surgeon: Huel Cote, MD;  Location: [facility] ORS;  Service: Orthopedics;  Laterality: Left;  . SHOULDER ARTHROSCOPY WITH ROTATOR CUFF REPAIR AND OPEN BICEPS TENODESIS Left 12/10/2021   Procedure: LEFT SHOULDER ARTHROSCOPY WITH ROTATOR CUFF REPAIR AND OPEN BICEPS TENODESIS;  Surgeon: Huel Cote, MD;  Location: [facility] OR;  Service: Orthopedics;  Laterality: Left;  . UPPER GASTROINTESTINAL ENDOSCOPY  2022   Social History   Occupational History  . Occupation: retired Runner, broadcasting/film[employer]video  Tobacco Use  . Smoking status: Never  . Smokeless tobacco: Never  Vaping Use  . Vaping status: Never Used  Substance and Sexual Activity  . Alcohol use: Yes    Alcohol/week: 1.0 standard drink of alcohol    Types: 1 Glasses of wine per week    Comment: one or two glasses of wine a couple times a week  . Drug use: No  . Sexual activity: Not on file

## 2023-01-04 NOTE — Addendum Note (Signed)
Addended by: Michaele Offer on: 01/04/2023 02:33 PM   Modules accepted: Orders

## 2023-01-06 ENCOUNTER — Encounter (HOSPITAL_BASED_OUTPATIENT_CLINIC_OR_DEPARTMENT_OTHER): Payer: Self-pay | Admitting: Physical Therapy

## 2023-01-06 ENCOUNTER — Ambulatory Visit (HOSPITAL_BASED_OUTPATIENT_CLINIC_OR_DEPARTMENT_OTHER): Payer: Medicare HMO | Admitting: Physical Therapy

## 2023-01-06 DIAGNOSIS — M25512 Pain in left shoulder: Secondary | ICD-10-CM

## 2023-01-06 DIAGNOSIS — M25612 Stiffness of left shoulder, not elsewhere classified: Secondary | ICD-10-CM | POA: Diagnosis not present

## 2023-01-06 DIAGNOSIS — M6281 Muscle weakness (generalized): Secondary | ICD-10-CM

## 2023-01-06 LAB — PROTEIN ELECTROPHORESIS, SERUM
Albumin ELP: 3.9 g/dL (ref 3.8–4.8)
Alpha 1: 0.3 g/dL (ref 0.2–0.3)
Alpha 2: 0.8 g/dL (ref 0.5–0.9)
Beta 2: 0.4 g/dL (ref 0.2–0.5)
Beta Globulin: 0.4 g/dL (ref 0.4–0.6)
Gamma Globulin: 0.7 g/dL — ABNORMAL LOW (ref 0.8–1.7)
Total Protein: 6.6 g/dL (ref 6.1–8.1)

## 2023-01-06 LAB — VITAMIN D 25 HYDROXY (VIT D DEFICIENCY, FRACTURES): Vit D, 25-Hydroxy: 53 ng/mL (ref 30–100)

## 2023-01-06 LAB — EXTRA SPECIMEN

## 2023-01-06 LAB — PARATHYROID HORMONE, INTACT (NO CA): PTH: 30 pg/mL (ref 16–77)

## 2023-01-06 LAB — TSH: TSH: 0.75 m[IU]/L (ref 0.40–4.50)

## 2023-01-06 NOTE — Therapy (Signed)
OUTPATIENT PHYSICAL THERAPY TREATMENT  Patient Name: Heidi Shah MRN: [id number] DOB:1945/09/23, 77 y.o., female Today's Date: 01/06/2023  END OF SESSION:  PT End of Session - 01/06/23 0929     Visit Number 3    Number of Visits 29    Date for PT Re-Evaluation 03/21/23    Authorization Type human[employer] MCR    PT Start Time 0930    PT Stop Time 1009    PT Time Calculation (min) 39 min    Activity Tolerance Patient tolerated treatment well    Behavior During Therapy WFL for tasks assessed/performed             Past Medical History:  Diagnosis Date   Allergy    Anxiety    Arthritis    Borderline glaucoma    Cataract    surgery - Bilateral cataracts removed   Cervical spondylolysis    Chicken pox    Colon polyps    Depression    Diverticulitis    Diverticulosis    Esophageal spasm    Esophageal stricture    Family history of adverse reaction to anesthesia    difficult time waking mother up   GERD (gastroesophageal reflux disease)    Heart murmur    Hypertension    Osteoporosis    Traumatic complete tear of left rotator cuff    Past Surgical History:  Procedure Laterality Date   cataract surgery Bilateral 1996   COLONOSCOPY  2022   EYE SURGERY  2000   detached L retina   HERNIA REPAIR Bilateral 1990   inguinal   NECK SURGERY     age 23-trimmed a muscle due to not being able to hold her head up   POLYPECTOMY  2022   REVERSE SHOULDER ARTHROPLASTY Left 12/16/2022   Procedure: LEFT REVERSE SHOULDER ARTHROPLASTY;  Surgeon: Huel Cote, MD;  Location: [facility];  Service: Orthopedics;  Laterality: Left;   SHOULDER ARTHROSCOPY WITH ROTATOR CUFF REPAIR AND OPEN BICEPS TENODESIS Left 12/10/2021   Procedure: LEFT SHOULDER ARTHROSCOPY WITH ROTATOR CUFF REPAIR AND OPEN BICEPS TENODESIS;  Surgeon: Huel Cote, MD;  Location: [facility];  Service: Orthopedics;  Laterality: Left;   UPPER GASTROINTESTINAL ENDOSCOPY  2022   Patient Active Problem List   Diagnosis Date  Noted   Traumatic complete tear of left rotator cuff    Cervical spondylosis with myelopathy and radiculopathy 03/14/2020   Neck pain 03/14/2020   Aortic heart murmur 02/25/2020   Age-related osteoporosis without current pathological fracture 04/04/2019   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   Constipation 03/05/2013   GERD (gastroesophageal reflux disease) 01/19/2013   Esophageal spasm 01/19/2013   Dysphagia 01/19/2013   Diverticulitis of colon (without mention of hemorrhage)(562.11) 11/23/2012   History of depression 11/23/2012   Essential hypertension, benign 11/23/2012     THERAPY DIAG:  Left shoulder pain, unspecified chronicity  Stiffness of left shoulder, not elsewhere classified  Muscle weakness (generalized)  REFERRING PROVIDER: Huel Cote, MD   REFERRING DIAG: S46.012A (ICD-10-CM) - Traumatic complete tear of left rotator cuff, initial encounter    S/p L RSA   THERAPY DIAG:  Left shoulder pain, unspecified chronicity   Stiffness of left shoulder, not elsewhere classified   Muscle weakness (generalized)   Rationale for Evaluation and Treatment: Rehabilitation   ONSET DATE: DOS 12/16/2022   SUBJECTIVE:  SUBJECTIVE STATEMENT: Patient states been doing HEP. Really sore from table slides. Doing HEP most days 2x/day.   EVAL: Pt underwent L shoulder RCR and biceps tenodesis in 11/2021.  In June of this year she was walking her dog when her dog pulled on the leash which jerked her arm.  Pt had a MRI which showed a full thickness tear of the supraspinatus with retraction.  Pt underwent L RSA on 12/16/2022.  Pt saw MD and had x rays which looked good.  He removed her stitches.  Pt states MD informed her she doesn't have to wear the sling except for sleeping and to not perform any lifting.      Pt is limited with performing her ADLs/IADLs including dressing and bathing.  Pt unable to wash her hair with L UE.  Pt is unable to perform reaching activities.     Hand dominance: Right   PERTINENT HISTORY: -L shoulder reverse shoulder arthroplasty (RSA) and biceps tenodesis on 12/16/2022.  -L shoulder RCR and biceps tenodesis in 11/2021. -Arthritis, cervical spondylosis, osteoporosis, HTN -Pt states she does have 2 small tears in her R shoulder and does some pain in R shoulder.     PAIN:  NPRS:  0/10 current, 2/10 worst, 0/10 best Pt states she can easily get rid of the pain with taking Ibuprofen and acetaminophen.   PRECAUTIONS: Other: Tears and pain in R shoulder, osteoporosis       WEIGHT BEARING RESTRICTIONS: Yes L UE   FALLS:  Has patient fallen in last 6 months? No   LIVING ENVIRONMENT: Lives with: lives alone Lives in: 1 story home Stairs: has steps to enter home and bilat rails on one entrance     OCCUPATION: Pt is retired.   PLOF: Independent   PATIENT GOALS:  to regain 100% movement in arm and use it as normal     OBJECTIVE:    DIAGNOSTIC FINDINGS:  Pt is post op.     PATIENT SURVEYS:  FOTO 41 with a goal of 57 at visit 16   COGNITION: Overall cognitive status: Within functional limits for tasks assessed                                    OBSERVATION: Steri strips over incision.  No signs of infection.    UPPER EXTREMITY ROM:     ROM Right eval Left eval Left 01/01/23 Left 01/06/23  Shoulder flexion 164 AROM PROM: 85 AAROM 92 AAROM 106  117 at EOS  Shoulder scaption 158 AROM      Shoulder abduction        Shoulder adduction        Shoulder internal rotation        Shoulder external rotation   PROM: 21 25   Elbow flexion        Elbow extension        Wrist flexion        Wrist extension        Wrist ulnar deviation        Wrist radial deviation        Wrist pronation        Wrist supination        (Blank rows = not tested)    UPPER EXTREMITY MMT:   Not tested due to healing constraints and surgical protocol  TODAY'S TREATMENT:                                                                                                                                          01/06/23 Supine shoulder flexion with cane 3 x 10 PROM for L shoulder GHJ within limitations Supine serratus anterior punches 2 x 10 Sidelying shoulder abduction 2 x 10  Seated shoulder flexion pulley 2 x 1 minute Standing shoulder isometrics 5 x 10 second holds each - flexion, abd, ext, ER, IR Seated small range lateral raise 2 x 10   01/01/23 PROM for L shoulder GHJ within limitations Supine shoulder flexion with cane 2 x 10 Supine AAROM ER with cane 2 x 10 Seated elbow flexion 2 x 10 Seated scap retractions 2 x 10 Seated flexion table slides 2 x 10 x 5-10 second holds Standing flexion walk back stretch 5 x 10 second holds     EVAL: Pt performed pendulums cw and ccw 2x10 each, wrist flex and ext AROM 2x10, hand pumps 2x10.  Pt received a HEP handout and was educated in correct form and appropriate frequency.  PT instructed pt she should not have pain with HEP.   PATIENT EDUCATION: Education details: surgical and protocol limitations and restrictions including to not allow arm to move behind her side and to not reach backward.  Dx, relevant anatomy, POC, HEP 01/06/23: HEP Person educated: Patient Education method: Explanation, Demonstration, Tactile cues, Verbal cues, and Handouts Education comprehension: verbalized understanding, returned demonstration, verbal cues required, tactile cues required, and needs further education   HOME EXERCISE PROGRAM: Access Code: [email or web address] URL: [email or web address] Date: 12/27/2022 Prepared by: Aaron Edelman   Exercises - Circular Shoulder Pendulum with Table Support  - 1 x daily - 2-3 x weekly - 2 sets - 10 reps - Wrist AROM Flexion Extension  - 3 x daily - 7 x weekly -  2-3 sets - 10 reps - Hand pumps  3x/day, 2-3 sets of 10 reps  01/01/23- Seated Elbow Flexion and Extension AROM  - 1 x daily - 7 x weekly - 3 sets - 10 reps - Supine Shoulder Flexion Extension AAROM with Dowel  - 3 x daily - 7 x weekly - 2 sets - 10 reps - Supine Shoulder External Rotation with Dowel  - 3 x daily - 7 x weekly - 2 sets - 10 reps - Seated Scapular Retraction  - 3 x daily - 7 x weekly - 3 sets - 10 reps - Seated Shoulder Flexion Towel Slide at Table Top (Mirrored)  - 3 x daily - 7 x weekly - 2 sets - 10 reps - 5-10 second hold - Standing 'L' Stretch at [employer]  - 3 x daily - 7 x weekly - 2 sets - 10 reps - 5-10 second hold  01/06/23- Supine Single Arm Scapular Protraction  - 1 x daily - 7 x weekly -  3 sets - 10 reps - Sidelying Shoulder Abduction Palm Forward (Mirrored)  - 1 x daily - 7 x weekly - 3 sets - 10 reps - Isometric Shoulder Flexion at Wall (Mirrored)  - 2 x daily - 7 x weekly - 5 reps - 10 second hold - Isometric Shoulder Abduction at Wall  - 5 x daily - 7 x weekly - 5 reps - 10 second hold - Isometric Shoulder Extension at Wall  - 2 x daily - 7 x weekly - 5 reps - 10 second hold - Standing Isometric Shoulder Internal Rotation at Doorway  - 2 x daily - 7 x weekly - 5 reps - 10 second hold - Isometric Shoulder External Rotation at Wall  - 2 x daily - 7 x weekly - 5 reps - 10 second hold   ASSESSMENT:   CLINICAL IMPRESSION: Patient now 3 weeks post op and progressing well. Patient with ROM to 106 flexion at beginning of session and improves to 117 with manual and exercise. Additional mobility exercises tolerated well and progressed per protocol to isometrics. Significant shoulder hike with lateral raise, completed in limited range before hike. Patient will continue to benefit from physical therapy in order to improve function and reduce impairment.   EVAL: Patient is a 77 y.o. female 1 week and 4 days s/p L RSA and biceps tenodesis presenting to the clinic with expected  post op findings of L shoulder pain, limited ROM in L shoulder, and muscle weakness in L UE.  Pt is limited with performing her ADLs/IADLs including dressing and bathing.  Pt is unable to perform reaching activities.  Pt reports MD has removed her sling except for sleeping.  Pt should benefit from skilled PT services per protocol to address impairments and improve overall function.       OBJECTIVE IMPAIRMENTS: decreased activity tolerance, decreased endurance, decreased ROM, decreased strength, hypomobility, impaired flexibility, impaired UE functional use, and pain.    ACTIVITY LIMITATIONS: carrying, lifting, bathing, dressing, reach over head, and hygiene/grooming   PARTICIPATION LIMITATIONS: meal prep, cleaning, laundry, driving, shopping, and community activity   PERSONAL FACTORS: 3+ comorbidities: cervical spondylosis, osteoporosis, R shoulder pain  are also affecting patient's functional outcome.    REHAB POTENTIAL: Good   CLINICAL DECISION MAKING: Stable/uncomplicated   EVALUATION COMPLEXITY: Low     GOALS:   SHORT TERM GOALS:    Pt will be independent and compliant with HEP for improved pain, ROM, strength, and function  Baseline: Goal status: INITIAL Target date:  01/24/2023     2.  Pt will demo improved PROM to 110 deg in flexion and 30 deg in ER for improved shoulder mobility and tightness Baseline:  Goal status: INITIAL Target date:  01/17/2023       3.  Pt will demo L shoulder AAROM to 130 deg in flexion (supine) and 30 deg in ER for improved stiffness and mobility Baseline:  Goal status: INITIAL Target date:  01/31/2023     4.  Pt will be able to actively perform R shoulder flexion AROM to at least 90 deg in supine.  Baseline:  Goal status: INITIAL Target date:  01/24/2023     5.  Pt will be able to actively elevate R shoulder > 90 deg without significant shoulder hike.  Baseline:  Goal status: INITIAL Target date:  02/28/2023     6.  Pt will be able  to perform her self care activities with no > than minimal difficulty.   Baseline:  Goal status: INITIAL Target date:  03/21/2023     LONG TERM GOALS: Target date:   04/18/2023     Pt will be able to perform her ADLs and IADLs without significant difficulty and pain.  Baseline:  Goal status: INITIAL   2.   Pt will demo L shoulder AROM to be [facility] t/o for performance of ADLs and IADLs  Baseline:  Goal status: INITIAL   3.  Pt will be able to perform her normal reaching and functional overhead activities without significant limitation and pain. Baseline:  Goal status: INITIAL   4.  Pt will be able to reach into an overhead cabinet without significant difficulty.  Baseline:  Goal status: INITIAL Target date:  04/04/2023     5.  Pt will progress with strengthening exercises per protocol without adverse effects in order to perform her normal functional lifting/carrying activities and household chores.  Baseline:  Goal status: INITIAL       PLAN:   PT FREQUENCY: 1-2x/week   PT DURATION: other: 16 weeks   PLANNED INTERVENTIONS: Therapeutic exercises, Therapeutic activity, Neuromuscular re-education, Patient/Family education, Self Care, Aquatic Therapy, Dry Needling, Electrical stimulation, Cryotherapy, Moist heat, Taping, Manual therapy, and Re-evaluation   PLAN FOR NEXT SESSION: Cont per Dr. Suzan Nailer RSA protocol.     Reola Mosher Achilles Neville, PT 01/06/2023, 10:10 AM

## 2023-01-13 ENCOUNTER — Encounter (HOSPITAL_BASED_OUTPATIENT_CLINIC_OR_DEPARTMENT_OTHER): Payer: Self-pay | Admitting: Physical Therapy

## 2023-01-13 ENCOUNTER — Ambulatory Visit (HOSPITAL_BASED_OUTPATIENT_CLINIC_OR_DEPARTMENT_OTHER): Payer: Medicare HMO | Admitting: Physical Therapy

## 2023-01-13 DIAGNOSIS — M25612 Stiffness of left shoulder, not elsewhere classified: Secondary | ICD-10-CM | POA: Diagnosis not present

## 2023-01-13 DIAGNOSIS — M6281 Muscle weakness (generalized): Secondary | ICD-10-CM

## 2023-01-13 DIAGNOSIS — M25512 Pain in left shoulder: Secondary | ICD-10-CM

## 2023-01-13 NOTE — Therapy (Signed)
OUTPATIENT PHYSICAL THERAPY TREATMENT  Patient Name: Heidi Shah MRN: [id number] DOB:April 11, 1945, 77 y.o., female Today's Date: 01/13/2023  END OF SESSION:  PT End of Session - 01/13/23 1304     Visit Number 4    Number of Visits 29    Date for PT Re-Evaluation 03/21/23    Authorization Type human[employer] MCR    PT Start Time 1304    PT Stop Time 1345    PT Time Calculation (min) 41 min    Activity Tolerance Patient tolerated treatment well    Behavior During Therapy WFL for tasks assessed/performed             Past Medical History:  Diagnosis Date   Allergy    Anxiety    Arthritis    Borderline glaucoma    Cataract    surgery - Bilateral cataracts removed   Cervical spondylolysis    Chicken pox    Colon polyps    Depression    Diverticulitis    Diverticulosis    Esophageal spasm    Esophageal stricture    Family history of adverse reaction to anesthesia    difficult time waking mother up   GERD (gastroesophageal reflux disease)    Heart murmur    Hypertension    Osteoporosis    Traumatic complete tear of left rotator cuff    Past Surgical History:  Procedure Laterality Date   cataract surgery Bilateral 1996   COLONOSCOPY  2022   EYE SURGERY  2000   detached L retina   HERNIA REPAIR Bilateral 1990   inguinal   NECK SURGERY     age 70-trimmed a muscle due to not being able to hold her head up   POLYPECTOMY  2022   REVERSE SHOULDER ARTHROPLASTY Left 12/16/2022   Procedure: LEFT REVERSE SHOULDER ARTHROPLASTY;  Surgeon: Huel Cote, MD;  Location: [facility];  Service: Orthopedics;  Laterality: Left;   SHOULDER ARTHROSCOPY WITH ROTATOR CUFF REPAIR AND OPEN BICEPS TENODESIS Left 12/10/2021   Procedure: LEFT SHOULDER ARTHROSCOPY WITH ROTATOR CUFF REPAIR AND OPEN BICEPS TENODESIS;  Surgeon: Huel Cote, MD;  Location: [facility];  Service: Orthopedics;  Laterality: Left;   UPPER GASTROINTESTINAL ENDOSCOPY  2022   Patient Active Problem List   Diagnosis Date  Noted   Traumatic complete tear of left rotator cuff    Cervical spondylosis with myelopathy and radiculopathy 03/14/2020   Neck pain 03/14/2020   Aortic heart murmur 02/25/2020   Age-related osteoporosis without current pathological fracture 04/04/2019   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   Constipation 03/05/2013   GERD (gastroesophageal reflux disease) 01/19/2013   Esophageal spasm 01/19/2013   Dysphagia 01/19/2013   Diverticulitis of colon (without mention of hemorrhage)(562.11) 11/23/2012   History of depression 11/23/2012   Essential hypertension, benign 11/23/2012     THERAPY DIAG:  Left shoulder pain, unspecified chronicity  Stiffness of left shoulder, not elsewhere classified  Muscle weakness (generalized)  REFERRING PROVIDER: Huel Cote, MD   REFERRING DIAG: S46.012A (ICD-10-CM) - Traumatic complete tear of left rotator cuff, initial encounter    S/p L RSA   THERAPY DIAG:  Left shoulder pain, unspecified chronicity   Stiffness of left shoulder, not elsewhere classified   Muscle weakness (generalized)   Rationale for Evaluation and Treatment: Rehabilitation   ONSET DATE: DOS 12/16/2022   SUBJECTIVE:  SUBJECTIVE STATEMENT: Patient states she thinks she still has a stich in. Shoulder feeling good, improving.    EVAL: Pt underwent L shoulder RCR and biceps tenodesis in 11/2021.  In June of this year she was walking her dog when her dog pulled on the leash which jerked her arm.  Pt had a MRI which showed a full thickness tear of the supraspinatus with retraction.  Pt underwent L RSA on 12/16/2022.  Pt saw MD and had x rays which looked good.  He removed her stitches.  Pt states MD informed her she doesn't have to wear the sling except for sleeping and to not perform any lifting.     Pt  is limited with performing her ADLs/IADLs including dressing and bathing.  Pt unable to wash her hair with L UE.  Pt is unable to perform reaching activities.     Hand dominance: Right   PERTINENT HISTORY: -L shoulder reverse shoulder arthroplasty (RSA) and biceps tenodesis on 12/16/2022.  -L shoulder RCR and biceps tenodesis in 11/2021. -Arthritis, cervical spondylosis, osteoporosis, HTN -Pt states she does have 2 small tears in her R shoulder and does some pain in R shoulder.     PAIN:  NPRS:  0/10 current, 2/10 worst, 0/10 best Pt states she can easily get rid of the pain with taking Ibuprofen and acetaminophen.   PRECAUTIONS: Other: Tears and pain in R shoulder, osteoporosis       WEIGHT BEARING RESTRICTIONS: Yes L UE   FALLS:  Has patient fallen in last 6 months? No   LIVING ENVIRONMENT: Lives with: lives alone Lives in: 1 story home Stairs: has steps to enter home and bilat rails on one entrance     OCCUPATION: Pt is retired.   PLOF: Independent   PATIENT GOALS:  to regain 100% movement in arm and use it as normal     OBJECTIVE:    DIAGNOSTIC FINDINGS:  Pt is post op.     PATIENT SURVEYS:  FOTO 41 with a goal of 57 at visit 16   COGNITION: Overall cognitive status: Within functional limits for tasks assessed                                    OBSERVATION: Steri strips over incision.  No signs of infection.    UPPER EXTREMITY ROM:     ROM Right eval Left eval Left 01/01/23 Left 01/06/23 01/13/23  Shoulder flexion 164 AROM PROM: 85 AAROM 92 AAROM 106  117 at EOS 120 127 at end of session  Shoulder scaption 158 AROM       Shoulder abduction         Shoulder adduction         Shoulder internal rotation         Shoulder external rotation   PROM: 21 25    Elbow flexion         Elbow extension         Wrist flexion         Wrist extension         Wrist ulnar deviation         Wrist radial deviation         Wrist pronation         Wrist  supination         (Blank rows = not tested)   UPPER EXTREMITY MMT:   Not tested due  to healing constraints and surgical protocol                 TODAY'S TREATMENT:                                                                                                                                         01/13/23 Supine shoulder flexion with cane 3 x 10 PROM for L shoulder GHJ within limitations Seated shoulder flexion/scaption pulley 3 x 1 minute Sidelying shoulder abduction 2 x 10   01/06/23 Supine shoulder flexion with cane 3 x 10 PROM for L shoulder GHJ within limitations Supine serratus anterior punches 2 x 10 Sidelying shoulder abduction 2 x 10  Seated shoulder flexion pulley 2 x 1 minute Standing shoulder isometrics 5 x 10 second holds each - flexion, abd, ext, ER, IR Seated small range lateral raise 2 x 10   01/01/23 PROM for L shoulder GHJ within limitations Supine shoulder flexion with cane 2 x 10 Supine AAROM ER with cane 2 x 10 Seated elbow flexion 2 x 10 Seated scap retractions 2 x 10 Seated flexion table slides 2 x 10 x 5-10 second holds Standing flexion walk back stretch 5 x 10 second holds     EVAL: Pt performed pendulums cw and ccw 2x10 each, wrist flex and ext AROM 2x10, hand pumps 2x10.  Pt received a HEP handout and was educated in correct form and appropriate frequency.  PT instructed pt she should not have pain with HEP.   PATIENT EDUCATION: Education details: surgical and protocol limitations and restrictions including to not allow arm to move behind her side and to not reach backward.  Dx, relevant anatomy, POC, HEP 01/06/23: HEP Person educated: Patient Education method: Explanation, Demonstration, Tactile cues, Verbal cues, and Handouts Education comprehension: verbalized understanding, returned demonstration, verbal cues required, tactile cues required, and needs further education   HOME EXERCISE PROGRAM: Access Code: [email or web address] URL:  [email or web address] Date: 12/27/2022 Prepared by: Aaron Edelman   Exercises - Circular Shoulder Pendulum with Table Support  - 1 x daily - 2-3 x weekly - 2 sets - 10 reps - Wrist AROM Flexion Extension  - 3 x daily - 7 x weekly - 2-3 sets - 10 reps - Hand pumps  3x/day, 2-3 sets of 10 reps  01/01/23- Seated Elbow Flexion and Extension AROM  - 1 x daily - 7 x weekly - 3 sets - 10 reps - Supine Shoulder Flexion Extension AAROM with Dowel  - 3 x daily - 7 x weekly - 2 sets - 10 reps - Supine Shoulder External Rotation with Dowel  - 3 x daily - 7 x weekly - 2 sets - 10 reps - Seated Scapular Retraction  - 3 x daily - 7 x weekly - 3 sets - 10 reps - Seated Shoulder Flexion Towel Slide at Table Top (Mirrored)  - 3 x daily - 7  x weekly - 2 sets - 10 reps - 5-10 second hold - Standing 'L' Stretch at [employer]  - 3 x daily - 7 x weekly - 2 sets - 10 reps - 5-10 second hold  01/06/23- Supine Single Arm Scapular Protraction  - 1 x daily - 7 x weekly - 3 sets - 10 reps - Sidelying Shoulder Abduction Palm Forward (Mirrored)  - 1 x daily - 7 x weekly - 3 sets - 10 reps - Isometric Shoulder Flexion at Wall (Mirrored)  - 2 x daily - 7 x weekly - 5 reps - 10 second hold - Isometric Shoulder Abduction at Wall  - 5 x daily - 7 x weekly - 5 reps - 10 second hold - Isometric Shoulder Extension at Wall  - 2 x daily - 7 x weekly - 5 reps - 10 second hold - Standing Isometric Shoulder Internal Rotation at Doorway  - 2 x daily - 7 x weekly - 5 reps - 10 second hold - Isometric Shoulder External Rotation at Wall  - 2 x daily - 7 x weekly - 5 reps - 10 second hold   ASSESSMENT:   CLINICAL IMPRESSION: Patient now 4 weeks post op and progressing well. ROM doing well and is getting to protocol limits PROM/AAROM. Patient will continue to benefit from physical therapy in order to improve function and reduce impairment.   EVAL: Patient is a 77 y.o. female 1 week and 4 days s/p L RSA and biceps tenodesis  presenting to the clinic with expected post op findings of L shoulder pain, limited ROM in L shoulder, and muscle weakness in L UE.  Pt is limited with performing her ADLs/IADLs including dressing and bathing.  Pt is unable to perform reaching activities.  Pt reports MD has removed her sling except for sleeping.  Pt should benefit from skilled PT services per protocol to address impairments and improve overall function.       OBJECTIVE IMPAIRMENTS: decreased activity tolerance, decreased endurance, decreased ROM, decreased strength, hypomobility, impaired flexibility, impaired UE functional use, and pain.    ACTIVITY LIMITATIONS: carrying, lifting, bathing, dressing, reach over head, and hygiene/grooming   PARTICIPATION LIMITATIONS: meal prep, cleaning, laundry, driving, shopping, and community activity   PERSONAL FACTORS: 3+ comorbidities: cervical spondylosis, osteoporosis, R shoulder pain  are also affecting patient's functional outcome.    REHAB POTENTIAL: Good   CLINICAL DECISION MAKING: Stable/uncomplicated   EVALUATION COMPLEXITY: Low     GOALS:   SHORT TERM GOALS:    Pt will be independent and compliant with HEP for improved pain, ROM, strength, and function  Baseline: Goal status: INITIAL Target date:  01/24/2023     2.  Pt will demo improved PROM to 110 deg in flexion and 30 deg in ER for improved shoulder mobility and tightness Baseline:  Goal status: INITIAL Target date:  01/17/2023       3.  Pt will demo L shoulder AAROM to 130 deg in flexion (supine) and 30 deg in ER for improved stiffness and mobility Baseline:  Goal status: INITIAL Target date:  01/31/2023     4.  Pt will be able to actively perform R shoulder flexion AROM to at least 90 deg in supine.  Baseline:  Goal status: INITIAL Target date:  01/24/2023     5.  Pt will be able to actively elevate R shoulder > 90 deg without significant shoulder hike.  Baseline:  Goal status: INITIAL Target date:   02/28/2023  6.  Pt will be able to perform her self care activities with no > than minimal difficulty.   Baseline:  Goal status: INITIAL Target date:  03/21/2023     LONG TERM GOALS: Target date:   04/18/2023     Pt will be able to perform her ADLs and IADLs without significant difficulty and pain.  Baseline:  Goal status: INITIAL   2.   Pt will demo L shoulder AROM to be [facility] t/o for performance of ADLs and IADLs  Baseline:  Goal status: INITIAL   3.  Pt will be able to perform her normal reaching and functional overhead activities without significant limitation and pain. Baseline:  Goal status: INITIAL   4.  Pt will be able to reach into an overhead cabinet without significant difficulty.  Baseline:  Goal status: INITIAL Target date:  04/04/2023     5.  Pt will progress with strengthening exercises per protocol without adverse effects in order to perform her normal functional lifting/carrying activities and household chores.  Baseline:  Goal status: INITIAL       PLAN:   PT FREQUENCY: 1-2x/week   PT DURATION: other: 16 weeks   PLANNED INTERVENTIONS: Therapeutic exercises, Therapeutic activity, Neuromuscular re-education, Patient/Family education, Self Care, Aquatic Therapy, Dry Needling, Electrical stimulation, Cryotherapy, Moist heat, Taping, Manual therapy, and Re-evaluation   PLAN FOR NEXT SESSION: Cont per Dr. Suzan Nailer RSA protocol.     Reola Mosher Kaysee Hergert, PT 01/13/2023, 1:04 PM

## 2023-01-19 ENCOUNTER — Ambulatory Visit: Payer: Medicare HMO | Admitting: Family Medicine

## 2023-01-19 VITALS — BP 126/80 | HR 85 | Temp 98.4°F | Ht <= 58 in | Wt 120.0 lb

## 2023-01-19 DIAGNOSIS — K219 Gastro-esophageal reflux disease without esophagitis: Secondary | ICD-10-CM | POA: Diagnosis not present

## 2023-01-19 DIAGNOSIS — M810 Age-related osteoporosis without current pathological fracture: Secondary | ICD-10-CM | POA: Diagnosis not present

## 2023-01-19 NOTE — Progress Notes (Signed)
Established Patient Office Visit  Subjective   Patient ID: Heidi Shah, female    DOB: 1945-08-03  Age: 77 y.o. MRN: [id number]  Chief Complaint  Patient presents with   Osteoporosis   Edema    Patient complains of left leg edema, x3 weeks     HPI   Heidi Shah is here to discuss osteoporosis issues.  She recently had total shoulder replacement and that went well.  She is recovering well with minimal pain.  Concern was expressed by her surgeon regarding her low bone density.  She had last DEXA scan less than 2 years ago which was January 2023 with T-score -3.6 involving the radius.  She is currently taking algae Cal along with calcium and vitamin D.  She previously took Fosamax per GYN years ago and estimates this was well over 12 to 14 years ago.  She then for a while took Prolia but had issues with cost as well as persistent leg pain 4 weeks after each injection.  She declines going back on Prolia.  She had recent consultation with orthopedic surgery PA.  They discussed possible use of Evenity but patient has concerns regarding stroke and cardiac disease risk.  She is fairly certain that she would not wish to take this medication.  She does have a history of GERD but currently well-controlled.  She does not have any dysphagia with pills although this is indicated by surgery PA note.  Patient denies ever having difficulty swallowing tablets.  She states that she was able to take Fosamax without severe reflux symptoms years ago.  Denies any recent fall or fracture.  No regular alcohol use.  Non-smoker.  Recent vitamin D level 53  Past Medical History:  Diagnosis Date   Allergy    Anxiety    Arthritis    Borderline glaucoma    Cataract    surgery - Bilateral cataracts removed   Cervical spondylolysis    Chicken pox    Colon polyps    Depression    Diverticulitis    Diverticulosis    Esophageal spasm    Esophageal stricture    Family history of adverse reaction to anesthesia     difficult time waking mother up   GERD (gastroesophageal reflux disease)    Heart murmur    Hypertension    Osteoporosis    Traumatic complete tear of left rotator cuff    Past Surgical History:  Procedure Laterality Date   cataract surgery Bilateral 1996   COLONOSCOPY  2022   EYE SURGERY  2000   detached L retina   HERNIA REPAIR Bilateral 1990   inguinal   NECK SURGERY     age 16-trimmed a muscle due to not being able to hold her head up   POLYPECTOMY  2022   REVERSE SHOULDER ARTHROPLASTY Left 12/16/2022   Procedure: LEFT REVERSE SHOULDER ARTHROPLASTY;  Surgeon: Huel Cote, MD;  Location: [facility];  Service: Orthopedics;  Laterality: Left;   SHOULDER ARTHROSCOPY WITH ROTATOR CUFF REPAIR AND OPEN BICEPS TENODESIS Left 12/10/2021   Procedure: LEFT SHOULDER ARTHROSCOPY WITH ROTATOR CUFF REPAIR AND OPEN BICEPS TENODESIS;  Surgeon: Huel Cote, MD;  Location: [facility];  Service: Orthopedics;  Laterality: Left;   UPPER GASTROINTESTINAL ENDOSCOPY  2022    reports that she has never smoked. She has never used smokeless tobacco. She reports current alcohol use of about 1.0 standard drink of alcohol per week. She reports that she does not use drugs. family history includes COPD in  her mother; Cancer in her father; Crohn's disease in her mother; Heart disease (age of onset: 77) in her mother; Heart disease (age of onset: 40) in her father. Allergies  Allergen Reactions   Nickel Sulfate [Nickel] Rash    Per skin test from dermatologist   Beeswax     Per skin test from dermatologist   Cetrimonium Chloride [Cetrimide]     Per skin test from dermatologist   Lactose Intolerance (Gi) Diarrhea   Methylisothiazolinone     Per skin test from dermatologist   Neomycin Sulfate [Neomycin]     Per skin test from dermatologist   Penicillins     Childhood Reaction    Propolis     Per skin test from dermatologist    Review of Systems  Constitutional:  Negative for malaise/fatigue and weight  loss.  Eyes:  Negative for blurred vision.  Respiratory:  Negative for shortness of breath.   Cardiovascular:  Negative for chest pain.  Gastrointestinal:  Negative for abdominal pain.  Neurological:  Negative for dizziness, weakness and headaches.      Objective:     BP 126/80 (BP Location: Left Arm, Patient Position: Sitting, Cuff Size: Normal)   Pulse 85   Temp 98.4 F (36.9 C) (Oral)   Ht 4\' 10"  (1.473 m)   Wt 120 lb (54.4 kg)   SpO2 98%   BMI 25.08 kg/m  BP Readings from Last 3 Encounters:  01/19/23 126/80  01/03/23 (!) 130/91  12/16/22 94/64   Wt Readings from Last 3 Encounters:  01/19/23 120 lb (54.4 kg)  01/03/23 118 lb (53.5 kg)  12/16/22 119 lb (54 kg)      Physical Exam Vitals reviewed.  Constitutional:      General: She is not in acute distress.    Appearance: She is well-developed. She is not ill-appearing.  Neck:     Thyroid: No thyromegaly.     Vascular: No JVD.  Cardiovascular:     Rate and Rhythm: Normal rate and regular rhythm.     Heart sounds: Murmur heard.  Pulmonary:     Effort: Pulmonary effort is normal. No respiratory distress.     Breath sounds: Normal breath sounds. No wheezing or rales.  Musculoskeletal:     Cervical back: Neck supple.  Neurological:     Mental Status: She is alert.      No results found for any visits on 01/19/23.  Last CBC Lab Results  Component Value Date   WBC 6.8 12/06/2022   HGB 13.8 12/06/2022   HCT 42.3 12/06/2022   MCV 92.4 12/06/2022   MCH 31.0 12/04/2021   RDW 12.9 12/06/2022   PLT 333.0 12/06/2022   Last metabolic panel Lab Results  Component Value Date   GLUCOSE 79 12/06/2022   NA 136 12/06/2022   K 3.8 12/06/2022   CL 99 12/06/2022   CO2 29 12/06/2022   BUN 16 12/06/2022   CREATININE 0.58 12/06/2022   GFR 88.89 12/06/2022   CALCIUM 9.9 12/06/2022   PROT 6.6 01/03/2023   ALBUMIN 4.4 03/01/2022   BILITOT 0.4 03/01/2022   ALKPHOS 58 03/01/2022   AST 20 03/01/2022   ALT 12  03/01/2022   ANIONGAP 8 12/04/2021   Last thyroid functions Lab Results  Component Value Date   TSH 0.75 01/03/2023   Last vitamin D Lab Results  Component Value Date   VD25OH 53 01/03/2023      The 10-year ASCVD risk score (Arnett DK, et al., 2019) is: 18.9%  Assessment & Plan:   #1 osteoporosis.  No recent fractures.  Previously took Fosamax years ago.  Cost and side effect issues with Prolia.  Recent discussion of possible use of Evenity the patient declined secondary to concern for cardiac and stroke risk.  We reviewed some recent studies comparing results with alendronate group. -She does agree to getting repeat DEXA scan and this was ordered -Continue regular weightbearing exercise -Continue regular calcium and vitamin D supplementation -Patient might be willing to consider going back on bisphosphonate with Fosamax depending on DEXA results.  She is currently using algae Cal.  She does have a history of GERD but currently controlled and states that she tolerated Fosamax previously and had no dysphagia with tablets.  #2 GERD-currently controlled.  History of chronic PPI use.  We spent approximately 30 minutes reviewing her osteoporosis history, discussing potential pharmacotherapy, and reviewing recent labs and prior therapies.   No follow-ups on file.    Evelena Peat, MD

## 2023-01-20 ENCOUNTER — Ambulatory Visit (HOSPITAL_BASED_OUTPATIENT_CLINIC_OR_DEPARTMENT_OTHER): Payer: Medicare HMO | Admitting: Physical Therapy

## 2023-01-20 ENCOUNTER — Encounter (HOSPITAL_BASED_OUTPATIENT_CLINIC_OR_DEPARTMENT_OTHER): Payer: Self-pay | Admitting: Physical Therapy

## 2023-01-20 DIAGNOSIS — M6281 Muscle weakness (generalized): Secondary | ICD-10-CM | POA: Diagnosis not present

## 2023-01-20 DIAGNOSIS — M25612 Stiffness of left shoulder, not elsewhere classified: Secondary | ICD-10-CM | POA: Diagnosis not present

## 2023-01-20 DIAGNOSIS — M25512 Pain in left shoulder: Secondary | ICD-10-CM | POA: Diagnosis not present

## 2023-01-20 NOTE — Therapy (Signed)
OUTPATIENT PHYSICAL THERAPY TREATMENT  Patient Name: Heidi Shah MRN: [id number] DOB:04/25/1945, 77 y.o., female Today's Date: 01/20/2023  END OF SESSION:  PT End of Session - 01/20/23 0934     Visit Number 5    Number of Visits 29    Date for PT Re-Evaluation 03/21/23    Authorization Type humana MCR    PT Start Time 0933    PT Stop Time 1015    PT Time Calculation (min) 42 min    Activity Tolerance Patient tolerated treatment well    Behavior During Therapy WFL for tasks assessed/performed             Past Medical History:  Diagnosis Date   Allergy    Anxiety    Arthritis    Borderline glaucoma    Cataract    surgery - Bilateral cataracts removed   Cervical spondylolysis    Chicken pox    Colon polyps    Depression    Diverticulitis    Diverticulosis    Esophageal spasm    Esophageal stricture    Family history of adverse reaction to anesthesia    difficult time waking mother up   GERD (gastroesophageal reflux disease)    Heart murmur    Hypertension    Osteoporosis    Traumatic complete tear of left rotator cuff    Past Surgical History:  Procedure Laterality Date   cataract surgery Bilateral 1996   COLONOSCOPY  2022   EYE SURGERY  2000   detached L retina   HERNIA REPAIR Bilateral 1990   inguinal   NECK SURGERY     age 36-trimmed a muscle due to not being able to hold her head up   POLYPECTOMY  2022   REVERSE SHOULDER ARTHROPLASTY Left 12/16/2022   Procedure: LEFT REVERSE SHOULDER ARTHROPLASTY;  Surgeon: Huel Cote, MD;  Location: [facility];  Service: Orthopedics;  Laterality: Left;   SHOULDER ARTHROSCOPY WITH ROTATOR CUFF REPAIR AND OPEN BICEPS TENODESIS Left 12/10/2021   Procedure: LEFT SHOULDER ARTHROSCOPY WITH ROTATOR CUFF REPAIR AND OPEN BICEPS TENODESIS;  Surgeon: Huel Cote, MD;  Location: [facility];  Service: Orthopedics;  Laterality: Left;   UPPER GASTROINTESTINAL ENDOSCOPY  2022   Patient Active Problem List   Diagnosis Date  Noted   Traumatic complete tear of left rotator cuff    Cervical spondylosis with myelopathy and radiculopathy 03/14/2020   Neck pain 03/14/2020   Aortic heart murmur 02/25/2020   Age-related osteoporosis without current pathological fracture 04/04/2019   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   Constipation 03/05/2013   GERD (gastroesophageal reflux disease) 01/19/2013   Esophageal spasm 01/19/2013   Dysphagia 01/19/2013   Diverticulitis of colon (without mention of hemorrhage)(562.11) 11/23/2012   History of depression 11/23/2012   Essential hypertension, benign 11/23/2012     THERAPY DIAG:  Left shoulder pain, unspecified chronicity  Stiffness of left shoulder, not elsewhere classified  Muscle weakness (generalized)  REFERRING PROVIDER: Huel Cote, MD   REFERRING DIAG: S46.012A (ICD-10-CM) - Traumatic complete tear of left rotator cuff, initial encounter    S/p L RSA   THERAPY DIAG:  Left shoulder pain, unspecified chronicity   Stiffness of left shoulder, not elsewhere classified   Muscle weakness (generalized)   Rationale for Evaluation and Treatment: Rehabilitation   ONSET DATE: DOS 12/16/2022   SUBJECTIVE:  SUBJECTIVE STATEMENT: Patient states her shoulder is doing good. No new issues. HEP going well.    EVAL: Pt underwent L shoulder RCR and biceps tenodesis in 11/2021.  In June of this year she was walking her dog when her dog pulled on the leash which jerked her arm.  Pt had a MRI which showed a full thickness tear of the supraspinatus with retraction.  Pt underwent L RSA on 12/16/2022.  Pt saw MD and had x rays which looked good.  He removed her stitches.  Pt states MD informed her she doesn't have to wear the sling except for sleeping and to not perform any lifting.     Pt is limited  with performing her ADLs/IADLs including dressing and bathing.  Pt unable to wash her hair with L UE.  Pt is unable to perform reaching activities.     Hand dominance: Right   PERTINENT HISTORY: -L shoulder reverse shoulder arthroplasty (RSA) and biceps tenodesis on 12/16/2022.  -L shoulder RCR and biceps tenodesis in 11/2021. -Arthritis, cervical spondylosis, osteoporosis, HTN -Pt states she does have 2 small tears in her R shoulder and does some pain in R shoulder.     PAIN:  NPRS:  0/10 current, 2/10 worst, 0/10 best Pt states she can easily get rid of the pain with taking Ibuprofen and acetaminophen.   PRECAUTIONS: Other: Tears and pain in R shoulder, osteoporosis       WEIGHT BEARING RESTRICTIONS: Yes L UE   FALLS:  Has patient fallen in last 6 months? No   LIVING ENVIRONMENT: Lives with: lives alone Lives in: 1 story home Stairs: has steps to enter home and bilat rails on one entrance     OCCUPATION: Pt is retired.   PLOF: Independent   PATIENT GOALS:  to regain 100% movement in arm and use it as normal     OBJECTIVE:    DIAGNOSTIC FINDINGS:  Pt is post op.     PATIENT SURVEYS:  FOTO 41 with a goal of 57 at visit 16   COGNITION: Overall cognitive status: Within functional limits for tasks assessed                                    OBSERVATION: Steri strips over incision.  No signs of infection.    UPPER EXTREMITY ROM:     ROM Right eval Left eval Left 01/01/23 Left 01/06/23 01/13/23 01/20/23  Shoulder flexion 164 AROM PROM: 85 AAROM 92 AAROM 106  117 at EOS 120 127 at end of session 117 12 at end of session  Shoulder scaption 158 AROM        Shoulder abduction          Shoulder adduction          Shoulder internal rotation          Shoulder external rotation   PROM: 21 25     Elbow flexion          Elbow extension          Wrist flexion          Wrist extension          Wrist ulnar deviation          Wrist radial deviation           Wrist pronation          Wrist supination          (  Blank rows = not tested)   UPPER EXTREMITY MMT:   Not tested due to healing constraints and surgical protocol                 TODAY'S TREATMENT:                                                                                                                                         01/20/23 Supine shoulder flexion with cane 3 x 10 Manual: PROM for L shoulder GHJ within limitations, grade I-II inferior glides in abduction, STM to L pec Supine serratus anterior punches 2# 2 x 10 Sidelying shoulder abduction 3 x 10  Standing shoulder isometrics 5 x 10 second holds each - flexion, abd, ext, ER, IR Shoulder wall walks 1 x 8   01/13/23 Supine shoulder flexion with cane 3 x 10 PROM for L shoulder GHJ within limitations Seated shoulder flexion/scaption pulley 3 x 1 minute Sidelying shoulder abduction 2 x 10   01/06/23 Supine shoulder flexion with cane 3 x 10 PROM for L shoulder GHJ within limitations Supine serratus anterior punches 2 x 10 Sidelying shoulder abduction 2 x 10  Seated shoulder flexion pulley 2 x 1 minute Standing shoulder isometrics 5 x 10 second holds each - flexion, abd, ext, ER, IR Seated small range lateral raise 2 x 10   01/01/23 PROM for L shoulder GHJ within limitations Supine shoulder flexion with cane 2 x 10 Supine AAROM ER with cane 2 x 10 Seated elbow flexion 2 x 10 Seated scap retractions 2 x 10 Seated flexion table slides 2 x 10 x 5-10 second holds Standing flexion walk back stretch 5 x 10 second holds     EVAL: Pt performed pendulums cw and ccw 2x10 each, wrist flex and ext AROM 2x10, hand pumps 2x10.  Pt received a HEP handout and was educated in correct form and appropriate frequency.  PT instructed pt she should not have pain with HEP.   PATIENT EDUCATION: Education details: surgical and protocol limitations and restrictions including to not allow arm to move behind her side and to not reach  backward.  Dx, relevant anatomy, POC, HEP 01/06/23: HEP Person educated: Patient Education method: Explanation, Demonstration, Tactile cues, Verbal cues, and Handouts Education comprehension: verbalized understanding, returned demonstration, verbal cues required, tactile cues required, and needs further education   HOME EXERCISE PROGRAM: Access Code: [email or web address] URL: [email or web address] Date: 12/27/2022 Prepared by: Aaron Edelman   Exercises - Circular Shoulder Pendulum with Table Support  - 1 x daily - 2-3 x weekly - 2 sets - 10 reps - Wrist AROM Flexion Extension  - 3 x daily - 7 x weekly - 2-3 sets - 10 reps - Hand pumps  3x/day, 2-3 sets of 10 reps  01/01/23- Seated Elbow Flexion and Extension AROM  - 1 x daily - 7 x weekly - 3 sets - 10 reps -  Supine Shoulder Flexion Extension AAROM with Dowel  - 3 x daily - 7 x weekly - 2 sets - 10 reps - Supine Shoulder External Rotation with Dowel  - 3 x daily - 7 x weekly - 2 sets - 10 reps - Seated Scapular Retraction  - 3 x daily - 7 x weekly - 3 sets - 10 reps - Seated Shoulder Flexion Towel Slide at Table Top (Mirrored)  - 3 x daily - 7 x weekly - 2 sets - 10 reps - 5-10 second hold - Standing 'L' Stretch at [employer]  - 3 x daily - 7 x weekly - 2 sets - 10 reps - 5-10 second hold  01/06/23- Supine Single Arm Scapular Protraction  - 1 x daily - 7 x weekly - 3 sets - 10 reps - Sidelying Shoulder Abduction Palm Forward (Mirrored)  - 1 x daily - 7 x weekly - 3 sets - 10 reps - Isometric Shoulder Flexion at Wall (Mirrored)  - 2 x daily - 7 x weekly - 5 reps - 10 second hold - Isometric Shoulder Abduction at Wall  - 5 x daily - 7 x weekly - 5 reps - 10 second hold - Isometric Shoulder Extension at Wall  - 2 x daily - 7 x weekly - 5 reps - 10 second hold - Standing Isometric Shoulder Internal Rotation at Doorway  - 2 x daily - 7 x weekly - 5 reps - 10 second hold - Isometric Shoulder External Rotation at Wall  - 2 x daily - 7 x weekly -  5 reps - 10 second hold  01/20/23- Supine Single Arm Shoulder Protraction  - 1 x daily - 7 x weekly - 3 sets - 10 reps - Prone Shoulder Extension - Single Arm (Mirrored)  - 1 x daily - 7 x weekly - 3 sets - 10 reps - Shoulder Flexion Wall Slide with Towel  - 1 x daily - 7 x weekly - 10 reps   ASSESSMENT:   CLINICAL IMPRESSION: Patient now 5 weeks post op and progressing well. ROM doing well within limitations. Continued with shoulder mobility and AROM/AAROM as able. Minimal cueing with isometrics with good carry over. Shoulder fatigue at 5-6 at end of session. Patient will continue to benefit from physical therapy in order to improve function and reduce impairment.   EVAL: Patient is a 77 y.o. female 1 week and 4 days s/p L RSA and biceps tenodesis presenting to the clinic with expected post op findings of L shoulder pain, limited ROM in L shoulder, and muscle weakness in L UE.  Pt is limited with performing her ADLs/IADLs including dressing and bathing.  Pt is unable to perform reaching activities.  Pt reports MD has removed her sling except for sleeping.  Pt should benefit from skilled PT services per protocol to address impairments and improve overall function.       OBJECTIVE IMPAIRMENTS: decreased activity tolerance, decreased endurance, decreased ROM, decreased strength, hypomobility, impaired flexibility, impaired UE functional use, and pain.    ACTIVITY LIMITATIONS: carrying, lifting, bathing, dressing, reach over head, and hygiene/grooming   PARTICIPATION LIMITATIONS: meal prep, cleaning, laundry, driving, shopping, and community activity   PERSONAL FACTORS: 3+ comorbidities: cervical spondylosis, osteoporosis, R shoulder pain  are also affecting patient's functional outcome.    REHAB POTENTIAL: Good   CLINICAL DECISION MAKING: Stable/uncomplicated   EVALUATION COMPLEXITY: Low     GOALS:   SHORT TERM GOALS:    Pt will be independent and  compliant with HEP for improved pain,  ROM, strength, and function  Baseline: Goal status: INITIAL Target date:  01/24/2023     2.  Pt will demo improved PROM to 110 deg in flexion and 30 deg in ER for improved shoulder mobility and tightness Baseline:  Goal status: INITIAL Target date:  01/17/2023       3.  Pt will demo L shoulder AAROM to 130 deg in flexion (supine) and 30 deg in ER for improved stiffness and mobility Baseline:  Goal status: INITIAL Target date:  01/31/2023     4.  Pt will be able to actively perform R shoulder flexion AROM to at least 90 deg in supine.  Baseline:  Goal status: INITIAL Target date:  01/24/2023     5.  Pt will be able to actively elevate R shoulder > 90 deg without significant shoulder hike.  Baseline:  Goal status: INITIAL Target date:  02/28/2023     6.  Pt will be able to perform her self care activities with no > than minimal difficulty.   Baseline:  Goal status: INITIAL Target date:  03/21/2023     LONG TERM GOALS: Target date:   04/18/2023     Pt will be able to perform her ADLs and IADLs without significant difficulty and pain.  Baseline:  Goal status: INITIAL   2.   Pt will demo L shoulder AROM to be [facility] t/o for performance of ADLs and IADLs  Baseline:  Goal status: INITIAL   3.  Pt will be able to perform her normal reaching and functional overhead activities without significant limitation and pain. Baseline:  Goal status: INITIAL   4.  Pt will be able to reach into an overhead cabinet without significant difficulty.  Baseline:  Goal status: INITIAL Target date:  04/04/2023     5.  Pt will progress with strengthening exercises per protocol without adverse effects in order to perform her normal functional lifting/carrying activities and household chores.  Baseline:  Goal status: INITIAL       PLAN:   PT FREQUENCY: 1-2x/week   PT DURATION: other: 16 weeks   PLANNED INTERVENTIONS: Therapeutic exercises, Therapeutic activity, Neuromuscular  re-education, Patient/Family education, Self Care, Aquatic Therapy, Dry Needling, Electrical stimulation, Cryotherapy, Moist heat, Taping, Manual therapy, and Re-evaluation   PLAN FOR NEXT SESSION: Cont per Dr. Suzan Nailer RSA protocol.     Reola Mosher Alima Naser, PT 01/20/2023, 10:16 AM

## 2023-01-25 DIAGNOSIS — L0889 Other specified local infections of the skin and subcutaneous tissue: Secondary | ICD-10-CM | POA: Diagnosis not present

## 2023-01-25 DIAGNOSIS — L308 Other specified dermatitis: Secondary | ICD-10-CM | POA: Diagnosis not present

## 2023-01-26 ENCOUNTER — Ambulatory Visit (HOSPITAL_BASED_OUTPATIENT_CLINIC_OR_DEPARTMENT_OTHER): Payer: Medicare HMO | Admitting: Orthopaedic Surgery

## 2023-01-26 DIAGNOSIS — Z96612 Presence of left artificial shoulder joint: Secondary | ICD-10-CM

## 2023-01-26 NOTE — Progress Notes (Signed)
Post Operative Evaluation    Procedure/Date of Surgery: Left shoulder reverse shoulder arthroplasty 9/19  Interval History:   6 weeks status post the above procedure.  Overall she is doing very well.  Range of motion is improved she is continuing to work with physical therapy   PMH/PSH/Family History/Social History/Meds/Allergies:    Past Medical History:  Diagnosis Date   Allergy    Anxiety    Arthritis    Borderline glaucoma    Cataract    surgery - Bilateral cataracts removed   Cervical spondylolysis    Chicken pox    Colon polyps    Depression    Diverticulitis    Diverticulosis    Esophageal spasm    Esophageal stricture    Family history of adverse reaction to anesthesia    difficult time waking mother up   GERD (gastroesophageal reflux disease)    Heart murmur    Hypertension    Osteoporosis    Traumatic complete tear of left rotator cuff    Past Surgical History:  Procedure Laterality Date   cataract surgery Bilateral 1996   COLONOSCOPY  2022   EYE SURGERY  2000   detached L retina   HERNIA REPAIR Bilateral 1990   inguinal   NECK SURGERY     age 17-trimmed a muscle due to not being able to hold her head up   POLYPECTOMY  2022   REVERSE SHOULDER ARTHROPLASTY Left 12/16/2022   Procedure: LEFT REVERSE SHOULDER ARTHROPLASTY;  Surgeon: Huel Cote, MD;  Location: [facility] OR[facility];  Service: Orthopedics;  Laterality: Left;   SHOULDER ARTHROSCOPY WITH ROTATOR CUFF REPAIR AND OPEN BICEPS TENODESIS Left 12/10/2021   Procedure: LEFT SHOULDER ARTHROSCOPY WITH ROTATOR CUFF REPAIR AND OPEN BICEPS TENODESIS;  Surgeon: Huel Cote, MD;  Location: [facility] OR;  Service: Orthopedics;  Laterality: Left;   UPPER GASTROINTESTINAL ENDOSCOPY  2022   Social History   Socioeconomic History   Marital status: Divorced    Spouse name: Not on file   Number of children: 2   Years of education: master's degree   Highest education level: Master's  degree (e.g., MA, MS, MEng, MEd, MSW, MBA)  Occupational History   Occupation: retired Runner, broadcasting/film/video  Tobacco Use   Smoking status: Never   Smokeless tobacco: Never  Vaping Use   Vaping status: Never Used  Substance and Sexual Activity   Alcohol use: Yes    Alcohol/week: 1.0 standard drink of alcohol    Types: 1 Glasses of wine per week    Comment: one or two glasses of wine a couple times a week   Drug use: No   Sexual activity: Not on file  Other Topics Concern   Not on file  Social History Narrative      Lives alone in ranch home.    2 children   Son lives in [place]   Continues to teach virtually part time   Social Determinants of Health   Financial Resource Strain: Low Risk  (01/18/2023)   Overall Financial Resource Strain (CARDIA)    Difficulty of Paying Living Expenses: Not hard at all  Food Insecurity: No Food Insecurity (01/18/2023)   Hunger Vital Sign    Worried About Running Out of Food in the Last Year: Never true    Ran Out of Food in the Last  Year: Never true  Transportation Needs: No Transportation Needs (01/18/2023)   PRAPARE - Administrator, Civil Service (Medical): No    Lack of Transportation (Non-Medical): No  Physical Activity: Unknown (01/18/2023)   Exercise Vital Sign    Days of Exercise per Week: 1 day    Minutes of Exercise per Session: Patient declined  Recent Concern: Physical Activity - Inactive (12/03/2022)   Exercise Vital Sign    Days of Exercise per Week: 0 days    Minutes of Exercise per Session: 60 min  Stress: No Stress Concern Present (01/18/2023)   [employer] of Occupational Health - Occupational Stress Questionnaire    Feeling of Stress : Not at all  Social Connections: Moderately Integrated (01/18/2023)   Social Connection and Isolation Panel [NHANES]    Frequency of Communication with Friends and Family: Once a week    Frequency of Social Gatherings with Friends and Family: Twice a week    Attends Religious  Services: More than 4 times per year    Active Member of [employer] or Organizations: Yes    Attends Engineer, structural: More than 4 times per year    Marital Status: Divorced   Family History  Problem Relation Age of Onset   Heart disease Mother 10   COPD Mother    Crohn's disease Mother    Heart disease Father 1   Cancer Father        kidney   Colon cancer Neg Hx    Esophageal cancer Neg Hx    Stomach cancer Neg Hx    Rectal cancer Neg Hx    Allergies  Allergen Reactions   Nickel Sulfate [Nickel] Rash    Per skin test from dermatologist   Beeswax     Per skin test from dermatologist   Cetrimonium Chloride [Cetrimide]     Per skin test from dermatologist   Lactose Intolerance (Gi) Diarrhea   Methylisothiazolinone     Per skin test from dermatologist   Neomycin Sulfate [Neomycin]     Per skin test from dermatologist   Penicillins     Childhood Reaction    Propolis     Per skin test from dermatologist   Current Outpatient Medications  Medication Sig Dispense Refill   acetaminophen (TYLENOL) 500 MG tablet Take 500 mg by mouth every 6 (six) hours as needed.     amLODipine (NORVASC) 2.5 MG tablet Take 1 tablet (2.5 mg total) by mouth daily. (Patient taking differently: Take 2.5 mg by mouth every morning.) 90 tablet 3   Biotin w/ Vitamins C & E (HAIR SKIN & NAILS GUMMIES PO) Take 2 each by mouth 4 (four) times a week.     Calcium Carb-Cholecalciferol (CALCIUM 600+D3 PO) Take 1 tablet by mouth daily.     cetirizine (ZYRTEC) 10 MG chewable tablet Chew 10 mg by mouth every morning.     cyanocobalamin (VITAMIN B12) 1000 MCG tablet Take 1,000 mcg by mouth daily.     Ginkgo Biloba 60 MG CAPS Take 60 mg by mouth daily.     ibuprofen (ADVIL) 800 MG tablet Take 800 mg by mouth every 8 (eight) hours as needed.     lisinopril-hydrochlorothiazide (ZESTORETIC) 20-12.5 MG tablet Take 1 tablet by mouth daily. (Patient taking differently: Take 1 tablet by mouth every morning.) 90  tablet 3   Multiple Vitamin (MULTI-VITAMIN) tablet Take 1 tablet by mouth 4 (four) times a week.     omeprazole (PRILOSEC) 40 MG capsule Take  1 capsule (40 mg total) by mouth daily. (Patient taking differently: Take 40 mg by mouth every morning.) 90 capsule 3   oxyCODONE (ROXICODONE) 5 MG immediate release tablet Take 1 tablet (5 mg total) by mouth every 4 (four) hours as needed for severe pain or breakthrough pain. 10 tablet 0   vitamin C (ASCORBIC ACID) 500 MG tablet Take 500 mg by mouth 4 (four) times a week.     VYZULTA 0.024 % SOLN Place 1 drop into both eyes at bedtime.     No current facility-administered medications for this visit.   No results found.  Review of Systems:   A ROS was performed including pertinent positives and negatives as documented in the HPI.   Musculoskeletal Exam:    There were no vitals taken for this visit.  Left incision is well-appearing without erythema or drainage.  In the spine position she is able to forward elevate to 120 with external rotation at the side to 30 actively.  Internal rotation to back pocket.  Distal neurosensory exam is intact  Imaging:    2 views left shoulder: Status post reverse shoulder arthroplasty without evidence of complication  I personally reviewed and interpreted the radiographs.   Assessment:   6-week status post left shoulder reverse shoulder arthroplasty without evidence of complication.  Overall she is continuing to improve dramatically.  At this time we would like to transition her into aquatic therapy to improve with range of motion.  Will plan [place] get [place] a note [place]  She will return to clinic in 6 weeks  Plan :    -She will return to clinic in 6 weeks for reassessment      I personally saw and evaluated the patient, and participated in the management and treatment plan.  Huel Cote, MD Attending Physician, Orthopedic Surgery  This document was dictated  using Dragon voice recognition software. A reasonable attempt at proof reading has been made to minimize errors.

## 2023-01-27 ENCOUNTER — Ambulatory Visit (HOSPITAL_BASED_OUTPATIENT_CLINIC_OR_DEPARTMENT_OTHER): Payer: Medicare HMO | Admitting: Physical Therapy

## 2023-01-27 DIAGNOSIS — M25612 Stiffness of left shoulder, not elsewhere classified: Secondary | ICD-10-CM | POA: Diagnosis not present

## 2023-01-27 DIAGNOSIS — M25512 Pain in left shoulder: Secondary | ICD-10-CM

## 2023-01-27 DIAGNOSIS — M6281 Muscle weakness (generalized): Secondary | ICD-10-CM | POA: Diagnosis not present

## 2023-01-28 ENCOUNTER — Encounter (HOSPITAL_BASED_OUTPATIENT_CLINIC_OR_DEPARTMENT_OTHER): Payer: Self-pay | Admitting: Physical Therapy

## 2023-02-02 ENCOUNTER — Ambulatory Visit (HOSPITAL_BASED_OUTPATIENT_CLINIC_OR_DEPARTMENT_OTHER): Payer: Medicare HMO | Attending: Orthopaedic Surgery | Admitting: Physical Therapy

## 2023-02-02 DIAGNOSIS — M25612 Stiffness of left shoulder, not elsewhere classified: Secondary | ICD-10-CM | POA: Diagnosis not present

## 2023-02-02 DIAGNOSIS — M6281 Muscle weakness (generalized): Secondary | ICD-10-CM | POA: Diagnosis not present

## 2023-02-02 DIAGNOSIS — M25512 Pain in left shoulder: Secondary | ICD-10-CM | POA: Diagnosis not present
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
## 2023-02-03 ENCOUNTER — Encounter (HOSPITAL_BASED_OUTPATIENT_CLINIC_OR_DEPARTMENT_OTHER): Payer: Self-pay | Admitting: Physical Therapy

## 2023-02-03 DIAGNOSIS — H401131 Primary open-angle glaucoma, bilateral, mild stage: Secondary | ICD-10-CM | POA: Diagnosis not present

## 2023-02-07 ENCOUNTER — Ambulatory Visit: Payer: Medicare HMO | Admitting: Family Medicine

## 2023-02-08 ENCOUNTER — Ambulatory Visit: Payer: Medicare HMO | Admitting: Family Medicine

## 2023-02-09 ENCOUNTER — Ambulatory Visit (HOSPITAL_BASED_OUTPATIENT_CLINIC_OR_DEPARTMENT_OTHER): Payer: Medicare HMO | Admitting: Physical Therapy

## 2023-02-09 DIAGNOSIS — M6281 Muscle weakness (generalized): Secondary | ICD-10-CM

## 2023-02-09 DIAGNOSIS — M25612 Stiffness of left shoulder, not elsewhere classified: Secondary | ICD-10-CM

## 2023-02-09 DIAGNOSIS — M25512 Pain in left shoulder: Secondary | ICD-10-CM

## 2023-02-09 NOTE — Therapy (Signed)
OUTPATIENT PHYSICAL THERAPY TREATMENT  Patient Name: Heidi Shah MRN: [id number] DOB:Oct 15, 1945, 77 y.o., female Today's Date: 02/10/2023  END OF SESSION:  PT End of Session - 02/09/23 1547     Visit Number 8    Number of Visits 29    Date for PT Re-Evaluation 03/21/23    Authorization Type humana MCR    PT Start Time 1544    PT Stop Time 1628    PT Time Calculation (min) 44 min    Activity Tolerance Patient tolerated treatment well    Behavior During Therapy WFL for tasks assessed/performed             Past Medical History:  Diagnosis Date   Allergy    Anxiety    Arthritis    Borderline glaucoma    Cataract    surgery - Bilateral cataracts removed   Cervical spondylolysis    Chicken pox    Colon polyps    Depression    Diverticulitis    Diverticulosis    Esophageal spasm    Esophageal stricture    Family history of adverse reaction to anesthesia    difficult time waking mother up   GERD (gastroesophageal reflux disease)    Heart murmur    Hypertension    Osteoporosis    Traumatic complete tear of left rotator cuff    Past Surgical History:  Procedure Laterality Date   cataract surgery Bilateral 1996   COLONOSCOPY  2022   EYE SURGERY  2000   detached L retina   HERNIA REPAIR Bilateral 1990   inguinal   NECK SURGERY     age 83-trimmed a muscle due to not being able to hold her head up   POLYPECTOMY  2022   REVERSE SHOULDER ARTHROPLASTY Left 12/16/2022   Procedure: LEFT REVERSE SHOULDER ARTHROPLASTY;  Surgeon: Huel Cote, MD;  Location: [facility] ORS;  Service: Orthopedics;  Laterality: Left;   SHOULDER ARTHROSCOPY WITH ROTATOR CUFF REPAIR AND OPEN BICEPS TENODESIS Left 12/10/2021   Procedure: LEFT SHOULDER ARTHROSCOPY WITH ROTATOR CUFF REPAIR AND OPEN BICEPS TENODESIS;  Surgeon: Huel Cote, MD;  Location: [facility] OR;  Service: Orthopedics;  Laterality: Left;   UPPER GASTROINTESTINAL ENDOSCOPY  2022   Patient Active Problem List   Diagnosis Date  Noted   Traumatic complete tear of left rotator cuff    Cervical spondylosis with myelopathy and radiculopathy 03/14/2020   Neck pain 03/14/2020   Aortic heart murmur 02/25/2020   Age-related osteoporosis without current pathological fracture 04/04/2019   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   Constipation 03/05/2013   GERD (gastroesophageal reflux disease) 01/19/2013   Esophageal spasm 01/19/2013   Dysphagia 01/19/2013   Diverticulitis of colon (without mention of hemorrhage)(562.11) 11/23/2012   History of depression 11/23/2012   Essential hypertension, benign 11/23/2012     THERAPY DIAG:  Left shoulder pain, unspecified chronicity  Stiffness of left shoulder, not elsewhere classified  Muscle weakness (generalized)  REFERRING PROVIDER: Huel Cote, MD   REFERRING DIAG: S46.012A (ICD-10-CM) - Traumatic complete tear of left rotator cuff, initial encounter    S/p L RSA   THERAPY DIAG:  Left shoulder pain, unspecified chronicity   Stiffness of left shoulder, not elsewhere classified   Muscle weakness (generalized)   Rationale for Evaluation and Treatment: Rehabilitation   ONSET DATE: DOS 12/16/2022   SUBJECTIVE:  SUBJECTIVE STATEMENT: Pt is 7 weeks and 6 days post op.  Pt reports she had a little pain 1-2/10 over the weekend.  Pt states she automatically picked up her grandchild without thinking about it.  She did it one time, but did not do it again.  She did have some pain afterwards.  Pt is compliant with HEP and reports some soreness with exercises. Pt denies any adverse effects after prior Rx, just some soreness the following day.         Hand dominance: Right   PERTINENT HISTORY: -L shoulder reverse shoulder arthroplasty (RSA) and biceps tenodesis on 12/16/2022.  -L shoulder RCR and  biceps tenodesis in 11/2021. -Arthritis, cervical spondylosis, osteoporosis, HTN -Pt states she does have 2 small tears in her R shoulder and does some pain in R shoulder.     PAIN:  NPRS:  0/10 current, 2/10 worst, 0/10 best Pt states she can easily get rid of the pain with taking Ibuprofen and acetaminophen.   PRECAUTIONS: Other: Tears and pain in R shoulder, osteoporosis       WEIGHT BEARING RESTRICTIONS: Yes L UE   FALLS:  Has patient fallen in last 6 months? No   LIVING ENVIRONMENT: Lives with: lives alone Lives in: 1 story home Stairs: has steps to enter home and bilat rails on one entrance     OCCUPATION: Pt is retired.   PLOF: Independent   PATIENT GOALS:  to regain 100% movement in arm and use it as normal     OBJECTIVE:    DIAGNOSTIC FINDINGS:  Pt is post op.     UPPER EXTREMITY ROM:     ROM Right eval Left eval Left 01/01/23 Left 01/06/23 01/13/23 01/20/23 01/27/23 02/02/23 Left  Shoulder flexion 164 AROM PROM: 85 AAROM 92 AAROM 106  117 at EOS 120 127 at end of session 117 12 at end of session PROM: 130 AROM in supine:  125   Shoulder scaption 158 AROM          Shoulder abduction            Shoulder adduction            Shoulder internal rotation            Shoulder external rotation   PROM: 21 25     AAROM: 41 deg   Elbow flexion            Elbow extension            Wrist flexion            Wrist extension            Wrist ulnar deviation            Wrist radial deviation            Wrist pronation            Wrist supination            (Blank rows = not tested)                 TODAY'S TREATMENT:  Pt received L shoulder PROM in flexion, scaption, abd, and ER per pt and tissue tolerance w/n protocol ranges PT worked on form with supine wand ER.  Pt performed 2x10 S/L abduction to 90 deg 2x10 Supine  wand flexion with head elevated on pillows x 10 reps Supine active flexion with head elevated on pillows 2 x 10 reps Standing wall walks in flexion x 5 reps and x 2 reps Standing wand ER 2x10  PT reviewed HEP and updated HEP.  Pt received a HEP handout and was educated in correct form and appropriate frequency.  PT instructed pt to not perform into a painful or tight range.    PATIENT EDUCATION: Education details: surgical and protocol limitations and restrictions including to not allow arm to move behind her side and to not reach backward.  Dx, relevant anatomy, POC, HEP 01/06/23: HEP Person educated: Patient Education method: Explanation, Demonstration, Tactile cues, Verbal cues, and Handouts Education comprehension: verbalized understanding, returned demonstration, verbal cues required, tactile cues required, and needs further education   HOME EXERCISE PROGRAM: Access Code: [email or web address] URL: [email or web address] Date: 12/27/2022 Prepared by: Aaron Edelman   Exercises - Circular Shoulder Pendulum with Table Support  - 1 x daily - 2-3 x weekly - 2 sets - 10 reps - Wrist AROM Flexion Extension  - 3 x daily - 7 x weekly - 2-3 sets - 10 reps - Hand pumps  3x/day, 2-3 sets of 10 reps  01/01/23- Seated Elbow Flexion and Extension AROM  - 1 x daily - 7 x weekly - 3 sets - 10 reps - Supine Shoulder Flexion Extension AAROM with Dowel  - 3 x daily - 7 x weekly - 2 sets - 10 reps - Supine Shoulder External Rotation with Dowel  - 3 x daily - 7 x weekly - 2 sets - 10 reps - Seated Scapular Retraction  - 3 x daily - 7 x weekly - 3 sets - 10 reps - Seated Shoulder Flexion Towel Slide at Table Top (Mirrored)  - 3 x daily - 7 x weekly - 2 sets - 10 reps - 5-10 second hold - Standing 'L' Stretch at [employer]  - 3 x daily - 7 x weekly - 2 sets - 10 reps - 5-10 second hold  01/06/23- Supine Single Arm Scapular Protraction  - 1 x daily - 7 x weekly - 3 sets - 10 reps - Sidelying Shoulder  Abduction Palm Forward (Mirrored)  - 1 x daily - 7 x weekly - 3 sets - 10 reps - Isometric Shoulder Flexion at Wall (Mirrored)  - 2 x daily - 7 x weekly - 5 reps - 10 second hold - Isometric Shoulder Abduction at Wall  - 5 x daily - 7 x weekly - 5 reps - 10 second hold - Isometric Shoulder Extension at Wall  - 2 x daily - 7 x weekly - 5 reps - 10 second hold - Standing Isometric Shoulder Internal Rotation at Doorway  - 2 x daily - 7 x weekly - 5 reps - 10 second hold - Isometric Shoulder External Rotation at Wall  - 2 x daily - 7 x weekly - 5 reps - 10 second hold  01/20/23- Supine Single Arm Shoulder Protraction  - 1 x daily - 7 x weekly - 3 sets - 10 reps - Prone Shoulder Extension - Single Arm (Mirrored)  - 1 x daily - 7 x weekly - 3 sets - 10 reps - Shoulder Flexion Wall Slide with Towel  -  1 x daily - 7 x weekly - 10 reps  Supine flexion AROM 2x/day, 2x10  Updated HEP: - Standing Shoulder Flexion Wall Walk  - 1-2 x daily - 7 x weekly - 1 sets - 5 reps   ASSESSMENT:   CLINICAL IMPRESSION: Patient is progressing with PROM/AAROM/AROM.  She does have tightness in L shoulder PROM which improved with increased reps of PROM.  She tolerated PROM well.  Pt is progressing appropriately with exercises per protocol.  PT spent time instructing pt in correct form with supine wand ER.  Pt demonstrated good form with supine wand ER after instruction/cuing and practice.  Pt able to perform standing wall walks in flexion AAROM without increased pain and was fatigued.  PT reviewed HEP and updated HEP.  PT answered questions concerning HEP.  She responded well to Rx having no c/o's and no pain after Rx.  Patient will continue to benefit from cont skilled PT services per protocol to address ongoing goals, improve ROM, and assist in restoring desired level of function.     OBJECTIVE IMPAIRMENTS: decreased activity tolerance, decreased endurance, decreased ROM, decreased strength, hypomobility, impaired  flexibility, impaired UE functional use, and pain.    ACTIVITY LIMITATIONS: carrying, lifting, bathing, dressing, reach over head, and hygiene/grooming   PARTICIPATION LIMITATIONS: meal prep, cleaning, laundry, driving, shopping, and community activity   PERSONAL FACTORS: 3+ comorbidities: cervical spondylosis, osteoporosis, R shoulder pain  are also affecting patient's functional outcome.    REHAB POTENTIAL: Good   CLINICAL DECISION MAKING: Stable/uncomplicated   EVALUATION COMPLEXITY: Low     GOALS:   SHORT TERM GOALS:    Pt will be independent and compliant with HEP for improved pain, ROM, strength, and function  Baseline: Goal status: INITIAL Target date:  01/24/2023     2.  Pt will demo improved PROM to 110 deg in flexion and 30 deg in ER for improved shoulder mobility and tightness Baseline:  Goal status: INITIAL Target date:  01/17/2023       3.  Pt will demo L shoulder AAROM to 130 deg in flexion (supine) and 30 deg in ER for improved stiffness and mobility Baseline:  Goal status: INITIAL Target date:  01/31/2023     4.  Pt will be able to actively perform R shoulder flexion AROM to at least 90 deg in supine.  Baseline:  Goal status: INITIAL Target date:  01/24/2023     5.  Pt will be able to actively elevate R shoulder > 90 deg without significant shoulder hike.  Baseline:  Goal status: INITIAL Target date:  02/28/2023     6.  Pt will be able to perform her self care activities with no > than minimal difficulty.   Baseline:  Goal status: INITIAL Target date:  03/21/2023     LONG TERM GOALS: Target date:   04/18/2023     Pt will be able to perform her ADLs and IADLs without significant difficulty and pain.  Baseline:  Goal status: INITIAL   2.   Pt will demo L shoulder AROM to be [facility] t/o for performance of ADLs and IADLs  Baseline:  Goal status: INITIAL   3.  Pt will be able to perform her normal reaching and functional overhead activities  without significant limitation and pain. Baseline:  Goal status: INITIAL   4.  Pt will be able to reach into an overhead cabinet without significant difficulty.  Baseline:  Goal status: INITIAL Target date:  04/04/2023  5.  Pt will progress with strengthening exercises per protocol without adverse effects in order to perform her normal functional lifting/carrying activities and household chores.  Baseline:  Goal status: INITIAL       PLAN:   PT FREQUENCY: 1-2x/week   PT DURATION: other: 16 weeks   PLANNED INTERVENTIONS: Therapeutic exercises, Therapeutic activity, Neuromuscular re-education, Patient/Family education, Self Care, Aquatic Therapy, Dry Needling, Electrical stimulation, Cryotherapy, Moist heat, Taping, Manual therapy, and Re-evaluation   PLAN FOR NEXT SESSION: Cont per Dr. Suzan Nailer RSA protocol.     Audie Clear III PT, DPT 02/10/23 10:36 PM

## 2023-02-10 ENCOUNTER — Encounter (HOSPITAL_BASED_OUTPATIENT_CLINIC_OR_DEPARTMENT_OTHER): Payer: Self-pay | Admitting: Physical Therapy

## 2023-02-12 ENCOUNTER — Encounter (HOSPITAL_BASED_OUTPATIENT_CLINIC_OR_DEPARTMENT_OTHER): Payer: Self-pay | Admitting: Physical Therapy

## 2023-02-12 ENCOUNTER — Ambulatory Visit (HOSPITAL_BASED_OUTPATIENT_CLINIC_OR_DEPARTMENT_OTHER): Payer: Medicare HMO | Admitting: Physical Therapy

## 2023-02-12 DIAGNOSIS — M25512 Pain in left shoulder: Secondary | ICD-10-CM | POA: Diagnosis not present

## 2023-02-12 DIAGNOSIS — M25612 Stiffness of left shoulder, not elsewhere classified: Secondary | ICD-10-CM

## 2023-02-12 DIAGNOSIS — M6281 Muscle weakness (generalized): Secondary | ICD-10-CM | POA: Diagnosis not present

## 2023-02-12 NOTE — Therapy (Signed)
OUTPATIENT PHYSICAL THERAPY TREATMENT  Patient Name: Heidi Shah MRN: [id number] DOB:1945/10/05, 77 y.o., female Today's Date: 02/12/2023  END OF SESSION:  PT End of Session - 02/12/23 1003     Visit Number 9    Number of Visits 29    Date for PT Re-Evaluation 03/21/23    Authorization Type humana MCR    PT Start Time 1003    PT Stop Time 1043    PT Time Calculation (min) 40 min    Activity Tolerance Patient tolerated treatment well    Behavior During Therapy WFL for tasks assessed/performed             Past Medical History:  Diagnosis Date   Allergy    Anxiety    Arthritis    Borderline glaucoma    Cataract    surgery - Bilateral cataracts removed   Cervical spondylolysis    Chicken pox    Colon polyps    Depression    Diverticulitis    Diverticulosis    Esophageal spasm    Esophageal stricture    Family history of adverse reaction to anesthesia    difficult time waking mother up   GERD (gastroesophageal reflux disease)    Heart murmur    Hypertension    Osteoporosis    Traumatic complete tear of left rotator cuff    Past Surgical History:  Procedure Laterality Date   cataract surgery Bilateral 1996   COLONOSCOPY  2022   EYE SURGERY  2000   detached L retina   HERNIA REPAIR Bilateral 1990   inguinal   NECK SURGERY     age 13-trimmed a muscle due to not being able to hold her head up   POLYPECTOMY  2022   REVERSE SHOULDER ARTHROPLASTY Left 12/16/2022   Procedure: LEFT REVERSE SHOULDER ARTHROPLASTY;  Surgeon: Huel Cote, MD;  Location: [facility] ORS;  Service: Orthopedics;  Laterality: Left;   SHOULDER ARTHROSCOPY WITH ROTATOR CUFF REPAIR AND OPEN BICEPS TENODESIS Left 12/10/2021   Procedure: LEFT SHOULDER ARTHROSCOPY WITH ROTATOR CUFF REPAIR AND OPEN BICEPS TENODESIS;  Surgeon: Huel Cote, MD;  Location: [facility];  Service: Orthopedics;  Laterality: Left;   UPPER GASTROINTESTINAL ENDOSCOPY  2022   Patient Active Problem List   Diagnosis Date  Noted   Traumatic complete tear of left rotator cuff    Cervical spondylosis with myelopathy and radiculopathy 03/14/2020   Neck pain 03/14/2020   Aortic heart murmur 02/25/2020   Age-related osteoporosis without current pathological fracture 04/04/2019   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   Constipation 03/05/2013   GERD (gastroesophageal reflux disease) 01/19/2013   Esophageal spasm 01/19/2013   Dysphagia 01/19/2013   Diverticulitis of colon (without mention of hemorrhage)(562.11) 11/23/2012   History of depression 11/23/2012   Essential hypertension, benign 11/23/2012     THERAPY DIAG:  Left shoulder pain, unspecified chronicity  Stiffness of left shoulder, not elsewhere classified  Muscle weakness (generalized)  REFERRING PROVIDER: Huel Cote, MD   REFERRING DIAG: S46.012A (ICD-10-CM) - Traumatic complete tear of left rotator cuff, initial encounter    S/p L RSA   THERAPY DIAG:  Left shoulder pain, unspecified chronicity   Stiffness of left shoulder, not elsewhere classified   Muscle weakness (generalized)   Rationale for Evaluation and Treatment: Rehabilitation   ONSET DATE: DOS 12/16/2022   SUBJECTIVE:  SUBJECTIVE STATEMENT: Pt 8 weeks post op. Patients states her shoulder is doing good. Can get her arm up higher at night when laying down.       Hand dominance: Right   PERTINENT HISTORY: -L shoulder reverse shoulder arthroplasty (RSA) and biceps tenodesis on 12/16/2022.  -L shoulder RCR and biceps tenodesis in 11/2021. -Arthritis, cervical spondylosis, osteoporosis, HTN -Pt states she does have 2 small tears in her R shoulder and does some pain in R shoulder.     PAIN:  NPRS:  0/10 current, 2/10 worst, 0/10 best Pt states she can easily get rid of the pain with taking  Ibuprofen and acetaminophen.   PRECAUTIONS: Other: Tears and pain in R shoulder, osteoporosis       WEIGHT BEARING RESTRICTIONS: Yes L UE   FALLS:  Has patient fallen in last 6 months? No   LIVING ENVIRONMENT: Lives with: lives alone Lives in: 1 story home Stairs: has steps to enter home and bilat rails on one entrance     OCCUPATION: Pt is retired.   PLOF: Independent   PATIENT GOALS:  to regain 100% movement in arm and use it as normal     OBJECTIVE:    DIAGNOSTIC FINDINGS:  Pt is post op.     UPPER EXTREMITY ROM:     ROM Right eval Left eval Left 01/01/23 Left 01/06/23 01/13/23 01/20/23 01/27/23 02/02/23 Left 02/12/23  Shoulder flexion 164 AROM PROM: 85 AAROM 92 AAROM 106  117 at EOS 120 127 at end of session 117 12 at end of session PROM: 130 AROM in supine:  125  Supine 141  Shoulder scaption 158 AROM           Shoulder abduction             Shoulder adduction             Shoulder internal rotation             Shoulder external rotation   PROM: 21 25     AAROM: 41 deg  45  Elbow flexion             Elbow extension             Wrist flexion             Wrist extension             Wrist ulnar deviation             Wrist radial deviation             Wrist pronation             Wrist supination             (Blank rows = not tested)                 TODAY'S TREATMENT:  02/12/23 Manual: PROM flexion, abduction, ER, grade II inferior glides in flexion/abduction Supine punches 1# 3 x 10 Supine shoulder flexion with YTB in hands 3 x 10  Standing row YTB 3 x 10 Band IR YTB 2 x 10  Sidelying ER with manual assist 3 x 10 Supine band ER YTB 1 x 10   02/09/23 Pt received L shoulder PROM in flexion, scaption, abd, and ER per pt and tissue tolerance w/n protocol ranges PT worked on form with supine wand ER.  Pt performed  2x10 S/L abduction to 90 deg 2x10 Supine wand flexion with head elevated on pillows x 10 reps Supine active flexion with head elevated on pillows 2 x 10 reps Standing wall walks in flexion x 5 reps and x 2 reps Standing wand ER 2x10  PT reviewed HEP and updated HEP.  Pt received a HEP handout and was educated in correct form and appropriate frequency.  PT instructed pt to not perform into a painful or tight range.    PATIENT EDUCATION: Education details: surgical and protocol limitations and restrictions including to not allow arm to move behind her side and to not reach backward.  Dx, relevant anatomy, POC, HEP 01/06/23: HEP Person educated: Patient Education method: Explanation, Demonstration, Tactile cues, Verbal cues, and Handouts Education comprehension: verbalized understanding, returned demonstration, verbal cues required, tactile cues required, and needs further education   HOME EXERCISE PROGRAM: Access Code: [email or web address] URL: [email or web address] Date: 02/12/2023 Prepared by: Greig Castilla Dilia Alemany  Exercises - Supine Shoulder Flexion Extension AAROM with Dowel  - 3 x daily - 7 x weekly - 2 sets - 10 reps - Supine Shoulder External Rotation with Dowel  - 3 x daily - 7 x weekly - 2 sets - 10 reps - Standing 'L' Stretch at Counter  - 3 x daily - 7 x weekly - 2 sets - 10 reps - 5-10 second hold - Supine Single Arm Scapular Protraction  - 1 x daily - 7 x weekly - 3 sets - 10 reps - Sidelying Shoulder Abduction Palm Forward (Mirrored)  - 1 x daily - 7 x weekly - 3 sets - 10 reps - Isometric Shoulder Flexion at Wall (Mirrored)  - 2 x daily - 7 x weekly - 5 reps - 10 second hold - Isometric Shoulder Abduction at Wall  - 5 x daily - 7 x weekly - 5 reps - 10 second hold - Isometric Shoulder Extension at Wall  - 2 x daily - 7 x weekly - 5 reps - 10 second hold - Standing Isometric Shoulder Internal Rotation at Doorway  - 2 x daily - 7 x weekly - 5 reps - 10 second hold - Isometric  Shoulder External Rotation at Wall  - 2 x daily - 7 x weekly - 5 reps - 10 second hold - Supine Single Arm Shoulder Protraction  - 1 x daily - 7 x weekly - 3 sets - 10 reps - Prone Shoulder Extension - Single Arm (Mirrored)  - 1 x daily - 7 x weekly - 3 sets - 10 reps - Shoulder Flexion Wall Slide with Towel  - 1 x daily - 7 x weekly - 10 reps - Supine Shoulder Flexion AROM  - 2 x daily - 7 x weekly - 2 sets - 10 reps - Standing Shoulder Flexion Wall Walk  - 1-2 x daily - 7 x weekly - 1 sets - 5 reps - Standing Shoulder Row with Anchored Resistance  - 1 x daily -  7 x weekly - 3 sets - 10 reps - Supine Shoulder External Rotation with Resistance  - 1 x daily - 7 x weekly - 2 sets - 10 reps   ASSESSMENT:   CLINICAL IMPRESSION: Patient demonstrating improving ROM in PROM/AAROM. Began band exercises which are tolerated well without symptoms. Assist with sidelying ER due to weakness and compensation, ER in better range with supine resisted ER. Patient will continue to benefit from physical therapy in order to improve function and reduce impairment.     OBJECTIVE IMPAIRMENTS: decreased activity tolerance, decreased endurance, decreased ROM, decreased strength, hypomobility, impaired flexibility, impaired UE functional use, and pain.    ACTIVITY LIMITATIONS: carrying, lifting, bathing, dressing, reach over head, and hygiene/grooming   PARTICIPATION LIMITATIONS: meal prep, cleaning, laundry, driving, shopping, and community activity   PERSONAL FACTORS: 3+ comorbidities: cervical spondylosis, osteoporosis, R shoulder pain  are also affecting patient's functional outcome.    REHAB POTENTIAL: Good   CLINICAL DECISION MAKING: Stable/uncomplicated   EVALUATION COMPLEXITY: Low     GOALS:   SHORT TERM GOALS:    Pt will be independent and compliant with HEP for improved pain, ROM, strength, and function  Baseline: Goal status: INITIAL Target date:  01/24/2023     2.  Pt will demo improved PROM  to 110 deg in flexion and 30 deg in ER for improved shoulder mobility and tightness Baseline:  Goal status: INITIAL Target date:  01/17/2023       3.  Pt will demo L shoulder AAROM to 130 deg in flexion (supine) and 30 deg in ER for improved stiffness and mobility Baseline:  Goal status: INITIAL Target date:  01/31/2023     4.  Pt will be able to actively perform R shoulder flexion AROM to at least 90 deg in supine.  Baseline:  Goal status: INITIAL Target date:  01/24/2023     5.  Pt will be able to actively elevate R shoulder > 90 deg without significant shoulder hike.  Baseline:  Goal status: INITIAL Target date:  02/28/2023     6.  Pt will be able to perform her self care activities with no > than minimal difficulty.   Baseline:  Goal status: INITIAL Target date:  03/21/2023     LONG TERM GOALS: Target date:   04/18/2023     Pt will be able to perform her ADLs and IADLs without significant difficulty and pain.  Baseline:  Goal status: INITIAL   2.   Pt will demo L shoulder AROM to be [facility] t/o for performance of ADLs and IADLs  Baseline:  Goal status: INITIAL   3.  Pt will be able to perform her normal reaching and functional overhead activities without significant limitation and pain. Baseline:  Goal status: INITIAL   4.  Pt will be able to reach into an overhead cabinet without significant difficulty.  Baseline:  Goal status: INITIAL Target date:  04/04/2023     5.  Pt will progress with strengthening exercises per protocol without adverse effects in order to perform her normal functional lifting/carrying activities and household chores.  Baseline:  Goal status: INITIAL       PLAN:   PT FREQUENCY: 1-2x/week   PT DURATION: other: 16 weeks   PLANNED INTERVENTIONS: Therapeutic exercises, Therapeutic activity, Neuromuscular re-education, Patient/Family education, Self Care, Aquatic Therapy, Dry Needling, Electrical stimulation, Cryotherapy, Moist heat,  Taping, Manual therapy, and Re-evaluation   PLAN FOR NEXT SESSION: Cont per Dr. Suzan Nailer RSA protocol.  Reola Mosher Harman Ferrin, PT 02/12/2023, 10:04 AM

## 2023-02-15 ENCOUNTER — Ambulatory Visit (HOSPITAL_BASED_OUTPATIENT_CLINIC_OR_DEPARTMENT_OTHER): Payer: Medicare HMO | Admitting: Physical Therapy

## 2023-02-15 DIAGNOSIS — M25512 Pain in left shoulder: Secondary | ICD-10-CM | POA: Diagnosis not present

## 2023-02-15 DIAGNOSIS — M25612 Stiffness of left shoulder, not elsewhere classified: Secondary | ICD-10-CM

## 2023-02-15 DIAGNOSIS — M6281 Muscle weakness (generalized): Secondary | ICD-10-CM | POA: Diagnosis not present

## 2023-02-15 NOTE — Therapy (Signed)
OUTPATIENT PHYSICAL THERAPY TREATMENT / PROGRESS NOTE  Progress Note Reporting Period 12/27/2022 to 02/15/2023  See note below for Objective Data and Assessment of Progress/Goals.      Patient Name: Heidi Shah MRN: [id number] DOB:February 24, 1946, 77 y.o., female Today's Date: 02/16/2023  END OF SESSION:  PT End of Session - 02/15/23 1039     Visit Number 10    Number of Visits 28    Date for PT Re-Evaluation 04/18/23    Authorization Type humana MCR    PT Start Time 1025    PT Stop Time 1105    PT Time Calculation (min) 40 min    Activity Tolerance Patient tolerated treatment well    Behavior During Therapy WFL for tasks assessed/performed              Past Medical History:  Diagnosis Date   Allergy    Anxiety    Arthritis    Borderline glaucoma    Cataract    surgery - Bilateral cataracts removed   Cervical spondylolysis    Chicken pox    Colon polyps    Depression    Diverticulitis    Diverticulosis    Esophageal spasm    Esophageal stricture    Family history of adverse reaction to anesthesia    difficult time waking mother up   GERD (gastroesophageal reflux disease)    Heart murmur    Hypertension    Osteoporosis    Traumatic complete tear of left rotator cuff    Past Surgical History:  Procedure Laterality Date   cataract surgery Bilateral 1996   COLONOSCOPY  2022   EYE SURGERY  2000   detached L retina   HERNIA REPAIR Bilateral 1990   inguinal   NECK SURGERY     age 85-trimmed a muscle due to not being able to hold her head up   POLYPECTOMY  2022   REVERSE SHOULDER ARTHROPLASTY Left 12/16/2022   Procedure: LEFT REVERSE SHOULDER ARTHROPLASTY;  Surgeon: Huel Cote, MD;  Location: [facility] OR[facility];  Service: Orthopedics;  Laterality: Left;   SHOULDER ARTHROSCOPY WITH ROTATOR CUFF REPAIR AND OPEN BICEPS TENODESIS Left 12/10/2021   Procedure: LEFT SHOULDER ARTHROSCOPY WITH ROTATOR CUFF REPAIR AND OPEN BICEPS TENODESIS;  Surgeon: Huel Cote,  MD;  Location: [facility] OR;  Service: Orthopedics;  Laterality: Left;   UPPER GASTROINTESTINAL ENDOSCOPY  2022   Patient Active Problem List   Diagnosis Date Noted   Traumatic complete tear of left rotator cuff    Cervical spondylosis with myelopathy and radiculopathy 03/14/2020   Neck pain 03/14/2020   Aortic heart murmur 02/25/2020   Age-related osteoporosis without current pathological fracture 04/04/2019   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   Constipation 03/05/2013   GERD (gastroesophageal reflux disease) 01/19/2013   Esophageal spasm 01/19/2013   Dysphagia 01/19/2013   Diverticulitis of colon (without mention of hemorrhage)(562.11) 11/23/2012   History of depression 11/23/2012   Essential hypertension, benign 11/23/2012     THERAPY DIAG:  Left shoulder pain, unspecified chronicity  Stiffness of left shoulder, not elsewhere classified  Muscle weakness (generalized)  REFERRING PROVIDER: Huel Cote, MD   REFERRING DIAG: S46.012A (ICD-10-CM) - Traumatic complete tear of left rotator cuff, initial encounter    S/p L RSA   THERAPY DIAG:  Left shoulder pain, unspecified chronicity   Stiffness of left shoulder, not elsewhere classified   Muscle weakness (generalized)   Rationale for Evaluation and Treatment: Rehabilitation   ONSET DATE: DOS 12/16/2022   SUBJECTIVE:  SUBJECTIVE STATEMENT: Pt 8 weeks and 5 days s/p L RSA.  Patients states her shoulder is doing pretty good.  Pt reports improved mobility in shoulder including reaching.  Pt reports improved performance of self care activities.  Pt hasn't used L UE in washing hair.   Pt is limited with reaching.  Pt reports compliance with HEP.  Pt reports she was very sore after prior Rx.  Pt is limited per protocol including lifting.       Hand  dominance: Right   PERTINENT HISTORY: -L shoulder reverse shoulder arthroplasty (RSA) and biceps tenodesis on 12/16/2022.  -L shoulder RCR and biceps tenodesis in 11/2021. -Arthritis, cervical spondylosis, osteoporosis, HTN -Pt states she does have 2 small tears in her R shoulder and does some pain in R shoulder.     PAIN:  NPRS:  0/10 current, 3-4/10 worst, 0/10 best Pt denies soreness.    PRECAUTIONS: Other: Tears and pain in R shoulder, osteoporosis       WEIGHT BEARING RESTRICTIONS: Yes L UE   FALLS:  Has patient fallen in last 6 months? No   LIVING ENVIRONMENT: Lives with: lives alone Lives in: 1 story home Stairs: has steps to enter home and bilat rails on one entrance     OCCUPATION: Pt is retired.   PLOF: Independent   PATIENT GOALS:  to regain 100% movement in arm and use it as normal     OBJECTIVE:    DIAGNOSTIC FINDINGS:  Pt is post op.     UPPER EXTREMITY ROM:     ROM Right eval Left eval Left 01/01/23 Left 01/06/23 01/13/23 01/20/23 01/27/23 02/02/23 Left 02/12/23 02/15/23 Left  Shoulder flexion 164 AROM PROM: 85 AAROM 92 AAROM 106  117 at EOS 120 127 at end of session 117 12 at end of session PROM: 130 AROM in supine:  125  Supine 141 (Supine) PROM:  143 AAROM: 140 AROM:  137  Shoulder scaption 158 AROM            Shoulder abduction              Shoulder adduction              Shoulder internal rotation              Shoulder external rotation   PROM: 21 25     AAROM: 41 deg  45 PROM:  58 deg AAROM:  52 deg  Elbow flexion              Elbow extension              Wrist flexion              Wrist extension              Wrist ulnar deviation              Wrist radial deviation              Wrist pronation              Wrist supination              (Blank rows = not tested)                 TODAY'S TREATMENT:  02/15/23 Pt received L shoulder PROM in flexion, abd, and ER per pt and tissue tolerance w/n protocol ranges Supine wand ER x 10 reps S/L ER with manual assist x 10 reps Standing wall walks in flexion x 7 reps Scap retraction with YTB 2 x 10 reps Supine wand flexion with head elevated on pillows x 10 reps Supine active flexion with head elevated on pillows x 10 reps   FOTO: Initial/Current:  41 / 55 with a goal of 57.   02/12/23 Manual: PROM flexion, abduction, ER, grade II inferior glides in flexion/abduction Supine punches 1# 3 x 10 Supine shoulder flexion with YTB in hands 3 x 10  Standing row YTB 3 x 10 Band IR YTB 2 x 10  Sidelying ER with manual assist 3 x 10 Supine band ER YTB 1 x 10   02/09/23 Pt received L shoulder PROM in flexion, scaption, abd, and ER per pt and tissue tolerance w/n protocol ranges PT worked on form with supine wand ER.  Pt performed 2x10 S/L abduction to 90 deg 2x10 Supine wand flexion with head elevated on pillows x 10 reps Supine active flexion with head elevated on pillows 2 x 10 reps Standing wall walks in flexion x 5 reps and x 2 reps Standing wand ER 2x10  PT reviewed HEP and updated HEP.  Pt received a HEP handout and was educated in correct form and appropriate frequency.  PT instructed pt to not perform into a painful or tight range.    PATIENT EDUCATION: Education details: surgical and protocol limitations and restrictions, objective findings, Dx, relevant anatomy, POC, goal progress, HEP, and exercise form. Person educated: Patient Education method: Explanation, Demonstration, Tactile cues, Verbal cues Education comprehension: verbalized understanding, returned demonstration, verbal cues required, tactile cues required, and needs further education   HOME EXERCISE PROGRAM: Access Code: [email or web address] URL: [email or web address] Date: 02/12/2023 Prepared by: Greig Castilla Zaunegger  Exercises - Supine Shoulder Flexion  Extension AAROM with Dowel  - 3 x daily - 7 x weekly - 2 sets - 10 reps - Supine Shoulder External Rotation with Dowel  - 3 x daily - 7 x weekly - 2 sets - 10 reps - Standing 'L' Stretch at Counter  - 3 x daily - 7 x weekly - 2 sets - 10 reps - 5-10 second hold - Supine Single Arm Scapular Protraction  - 1 x daily - 7 x weekly - 3 sets - 10 reps - Sidelying Shoulder Abduction Palm Forward (Mirrored)  - 1 x daily - 7 x weekly - 3 sets - 10 reps - Isometric Shoulder Flexion at Wall (Mirrored)  - 2 x daily - 7 x weekly - 5 reps - 10 second hold - Isometric Shoulder Abduction at Wall  - 5 x daily - 7 x weekly - 5 reps - 10 second hold - Isometric Shoulder Extension at Wall  - 2 x daily - 7 x weekly - 5 reps - 10 second hold - Standing Isometric Shoulder Internal Rotation at Doorway  - 2 x daily - 7 x weekly - 5 reps - 10 second hold - Isometric Shoulder External Rotation at Wall  - 2 x daily - 7 x weekly - 5 reps - 10 second hold - Supine Single Arm Shoulder Protraction  - 1 x daily - 7 x weekly - 3 sets - 10 reps - Prone Shoulder Extension - Single Arm (Mirrored)  - 1 x daily - 7 x weekly - 3 sets - 10 reps -  Shoulder Flexion Wall Slide with Towel  - 1 x daily - 7 x weekly - 10 reps - Supine Shoulder Flexion AROM  - 2 x daily - 7 x weekly - 2 sets - 10 reps - Standing Shoulder Flexion Wall Walk  - 1-2 x daily - 7 x weekly - 1 sets - 5 reps - Standing Shoulder Row with Anchored Resistance  - 1 x daily - 7 x weekly - 3 sets - 10 reps - Supine Shoulder External Rotation with Resistance  - 1 x daily - 7 x weekly - 2 sets - 10 reps   ASSESSMENT:   CLINICAL IMPRESSION: Pt is progressing well with ROM, function, and protocol.  Pt reports improved performance of self care activities.  She has difficulty with washing hair and doesn't use L UE with washing hair.  Pt demonstrates improved ROM t/o L shoulder and is progressing appropriately with shoulder ROM per protocol.  Pt reports improved mobility in  shoulder including reaching and continues to be limited with reaching.  Pt is compliant with HEP.  She performed exercises well with cuing for correct form.  Pt demonstrates improved self perceived disability with FOTO score improving from 41 to 55.  Pt has met STG's #1-4.  Pt will benefit from cont skilled PT services to address impairments and ongoing goals and to assist in restoring desired level of function.     OBJECTIVE IMPAIRMENTS: decreased activity tolerance, decreased endurance, decreased ROM, decreased strength, hypomobility, impaired flexibility, impaired UE functional use, and pain.    ACTIVITY LIMITATIONS: carrying, lifting, bathing, dressing, reach over head, and hygiene/grooming   PARTICIPATION LIMITATIONS: meal prep, cleaning, laundry, driving, shopping, and community activity   PERSONAL FACTORS: 3+ comorbidities: cervical spondylosis, osteoporosis, R shoulder pain  are also affecting patient's functional outcome.    REHAB POTENTIAL: Good   CLINICAL DECISION MAKING: Stable/uncomplicated   EVALUATION COMPLEXITY: Low     GOALS:   SHORT TERM GOALS:    Pt will be independent and compliant with HEP for improved pain, ROM, strength, and function  Baseline: Goal status: GOAL MET  Target date:  01/24/2023     2.  Pt will demo improved PROM to 110 deg in flexion and 30 deg in ER for improved shoulder mobility and tightness Baseline:  Goal status: GOAL MET  11/19 Target date:  01/17/2023       3.  Pt will demo L shoulder AAROM to 130 deg in flexion (supine) and 30 deg in ER for improved stiffness and mobility Baseline:  Goal status: GOAL MET  11/19 Target date:  01/31/2023     4.  Pt will be able to actively perform R shoulder flexion AROM to at least 90 deg in supine.  Baseline:  Goal status: GOAL MET  11/19 Target date:  01/24/2023     5.  Pt will be able to actively elevate R shoulder > 90 deg without significant shoulder hike.  Baseline:  Goal status:  ONGOING Target date:  02/28/2023     6.  Pt will be able to perform her self care activities with no > than minimal difficulty.   Baseline:  Goal status: PROGRESSING  11/19 Target date:  03/21/2023     LONG TERM GOALS: Target date:   04/18/2023     Pt will be able to perform her ADLs and IADLs without significant difficulty and pain.  Baseline:  Goal status: INITIAL   2.   Pt will demo L shoulder AROM to be  WFL t/o for performance of ADLs and IADLs  Baseline:  Goal status: INITIAL   3.  Pt will be able to perform her normal reaching and functional overhead activities without significant limitation and pain. Baseline:  Goal status: INITIAL   4.  Pt will be able to reach into an overhead cabinet without significant difficulty.  Baseline:  Goal status: INITIAL Target date:  04/04/2023     5.  Pt will progress with strengthening exercises per protocol without adverse effects in order to perform her normal functional lifting/carrying activities and household chores.  Baseline:  Goal status: INITIAL       PLAN:   PT FREQUENCY: 2x/week   PT DURATION: other: 9 weeks   PLANNED INTERVENTIONS: Therapeutic exercises, Therapeutic activity, Neuromuscular re-education, Patient/Family education, Self Care, Aquatic Therapy, Dry Needling, Electrical stimulation, Cryotherapy, Moist heat, Taping, Manual therapy, and Re-evaluation   PLAN FOR NEXT SESSION: Cont per Dr. Suzan Nailer RSA protocol.      Audie Clear III PT, DPT 02/16/23 2:22 PM

## 2023-02-16 ENCOUNTER — Encounter (HOSPITAL_BASED_OUTPATIENT_CLINIC_OR_DEPARTMENT_OTHER): Payer: Self-pay | Admitting: Physical Therapy

## 2023-02-18 ENCOUNTER — Encounter (HOSPITAL_BASED_OUTPATIENT_CLINIC_OR_DEPARTMENT_OTHER): Payer: Self-pay | Admitting: Physical Therapy

## 2023-02-18 ENCOUNTER — Ambulatory Visit (HOSPITAL_BASED_OUTPATIENT_CLINIC_OR_DEPARTMENT_OTHER): Payer: Medicare HMO | Admitting: Physical Therapy

## 2023-02-18 DIAGNOSIS — M25512 Pain in left shoulder: Secondary | ICD-10-CM

## 2023-02-18 DIAGNOSIS — M25612 Stiffness of left shoulder, not elsewhere classified: Secondary | ICD-10-CM

## 2023-02-18 DIAGNOSIS — M6281 Muscle weakness (generalized): Secondary | ICD-10-CM

## 2023-02-18 NOTE — Therapy (Signed)
OUTPATIENT PHYSICAL THERAPY TREATMENT / PROGRESS NOTE      Patient Name: Heidi Shah MRN: [id number] DOB:[birth year], 77 y.o., female Today's Date: 02/18/2023  END OF SESSION:  PT End of Session - 02/18/23 1107     Visit Number 11    Number of Visits 28    Date for PT Re-Evaluation 04/18/23    Authorization Type [employer] MCR    PT Start Time 1107    PT Stop Time 1148    PT Time Calculation (min) 41 min    Activity Tolerance Patient tolerated treatment well    Behavior During Therapy WFL for tasks assessed/performed              Past Medical History:  Diagnosis Date   Allergy    Anxiety    Arthritis    Borderline glaucoma    Cataract    surgery - Bilateral cataracts removed   Cervical spondylolysis    Chicken pox    Colon polyps    Depression    Diverticulitis    Diverticulosis    Esophageal spasm    Esophageal stricture    Family history of adverse reaction to anesthesia    difficult time waking mother up   GERD (gastroesophageal reflux disease)    Heart murmur    Hypertension    Osteoporosis    Traumatic complete tear of left rotator cuff    Past Surgical History:  Procedure Laterality Date   cataract surgery Bilateral 1996   COLONOSCOPY  2022   EYE SURGERY  2000   detached L retina   HERNIA REPAIR Bilateral 1990   inguinal   NECK SURGERY     age 70-trimmed a muscle due to not being able to hold her head up   POLYPECTOMY  2022   REVERSE SHOULDER ARTHROPLASTY Left 12/16/2022   Procedure: LEFT REVERSE SHOULDER ARTHROPLASTY;  Surgeon: Huel Cote, MD;  Location: [facility];  Service: Orthopedics;  Laterality: Left;   SHOULDER ARTHROSCOPY WITH ROTATOR CUFF REPAIR AND OPEN BICEPS TENODESIS Left 12/10/2021   Procedure: LEFT SHOULDER ARTHROSCOPY WITH ROTATOR CUFF REPAIR AND OPEN BICEPS TENODESIS;  Surgeon: Huel Cote, MD;  Location: [facility];  Service: Orthopedics;  Laterality: Left;   UPPER GASTROINTESTINAL ENDOSCOPY  2022   Patient Active  Problem List   Diagnosis Date Noted   Traumatic complete tear of left rotator cuff    Cervical spondylosis with myelopathy and radiculopathy 03/14/2020   Neck pain 03/14/2020   Aortic heart murmur 02/25/2020   Age-related osteoporosis without current pathological fracture 04/04/2019   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   Constipation 03/05/2013   GERD (gastroesophageal reflux disease) 01/19/2013   Esophageal spasm 01/19/2013   Dysphagia 01/19/2013   Diverticulitis of colon (without mention of hemorrhage)(562.11) 11/23/2012   History of depression 11/23/2012   Essential hypertension, benign 11/23/2012     THERAPY DIAG:  Left shoulder pain, unspecified chronicity  Stiffness of left shoulder, not elsewhere classified  Muscle weakness (generalized)  REFERRING PROVIDER: Huel Cote, MD   REFERRING DIAG: S46.012A (ICD-10-CM) - Traumatic complete tear of left rotator cuff, initial encounter    S/p L RSA   THERAPY DIAG:  Left shoulder pain, unspecified chronicity   Stiffness of left shoulder, not elsewhere classified   Muscle weakness (generalized)   Rationale for Evaluation and Treatment: Rehabilitation   ONSET DATE: DOS 12/16/2022   SUBJECTIVE:  SUBJECTIVE STATEMENT: Pt states shoulder doing well. HEP going well.       Hand dominance: Right   PERTINENT HISTORY: -L shoulder reverse shoulder arthroplasty (RSA) and biceps tenodesis on 12/16/2022.  -L shoulder RCR and biceps tenodesis in 11/2021. -Arthritis, cervical spondylosis, osteoporosis, HTN -Pt states she does have 2 small tears in her R shoulder and does some pain in R shoulder.     PAIN:  NPRS:  0/10 current, 3-4/10 worst, 0/10 best Pt denies soreness.    PRECAUTIONS: Other: Tears and pain in R shoulder, osteoporosis        WEIGHT BEARING RESTRICTIONS: Yes L UE   FALLS:  Has patient fallen in last 6 months? No   LIVING ENVIRONMENT: Lives with: lives alone Lives in: 1 story home Stairs: has steps to enter home and bilat rails on one entrance     OCCUPATION: Pt is retired.   PLOF: Independent   PATIENT GOALS:  to regain 100% movement in arm and use it as normal     OBJECTIVE:    DIAGNOSTIC FINDINGS:  Pt is post op.     UPPER EXTREMITY ROM:     ROM Right eval Left eval Left 01/01/23 Left 01/06/23 01/13/23 01/20/23 01/27/23 02/02/23 Left 02/12/23 02/15/23 Left 02/18/23  Shoulder flexion 164 AROM PROM: 85 AAROM 92 AAROM 106  117 at EOS 120 127 at end of session 117 12 at end of session PROM: 130 AROM in supine:  125  Supine 141 (Supine) PROM:  143 AAROM: 140 AROM:  137 145  Shoulder scaption 158 AROM             Shoulder abduction               Shoulder adduction               Shoulder internal rotation               Shoulder external rotation   PROM: 21 25     AAROM: 41 deg  45 PROM:  58 deg AAROM:  52 deg   Elbow flexion               Elbow extension               Wrist flexion               Wrist extension               Wrist ulnar deviation               Wrist radial deviation               Wrist pronation               Wrist supination               (Blank rows = not tested)                 TODAY'S TREATMENT:  02/18/23 Manual: PROM flexion, abduction, ER, Supine band ER YTB 2 x 10 Sidelying ER with manual assist 3 x 10 Prone shoulder extension 2 x 10  Prone row 2 x 10 Prone horizontal abduction  small range 1 x 10 discontinued due to mechanics Standing shoulder flexion with YTB band in hands 2 x 10 limited range Standing row YTB 3 x 10 Standing shoulder extension YTB 2 x 10  Supine shoulder flexion 2# wand 2 x 10   02/15/23 Pt  received L shoulder PROM in flexion, abd, and ER per pt and tissue tolerance w/n protocol ranges Supine wand ER x 10 reps S/L ER with manual assist x 10 reps Standing wall walks in flexion x 7 reps Scap retraction with YTB 2 x 10 reps Supine wand flexion with head elevated on pillows x 10 reps Supine active flexion with head elevated on pillows x 10 reps   FOTO: Initial/Current:  41 / 55 with a goal of 57.   02/12/23 Manual: PROM flexion, abduction, ER, grade II inferior glides in flexion/abduction Supine punches 1# 3 x 10 Supine shoulder flexion with YTB in hands 3 x 10  Standing row YTB 3 x 10 Band IR YTB 2 x 10  Sidelying ER with manual assist 3 x 10 Supine band ER YTB 1 x 10   02/09/23 Pt received L shoulder PROM in flexion, scaption, abd, and ER per pt and tissue tolerance w/n protocol ranges PT worked on form with supine wand ER.  Pt performed 2x10 S/L abduction to 90 deg 2x10 Supine wand flexion with head elevated on pillows x 10 reps Supine active flexion with head elevated on pillows 2 x 10 reps Standing wall walks in flexion x 5 reps and x 2 reps Standing wand ER 2x10  PT reviewed HEP and updated HEP.  Pt received a HEP handout and was educated in correct form and appropriate frequency.  PT instructed pt to not perform into a painful or tight range.    PATIENT EDUCATION: Education details: surgical and protocol limitations and restrictions, objective findings, Dx, relevant anatomy, POC, goal progress, HEP, and exercise form. Person educated: Patient Education method: Explanation, Demonstration, Tactile cues, Verbal cues Education comprehension: verbalized understanding, returned demonstration, verbal cues required, tactile cues required, and needs further education   HOME EXERCISE PROGRAM: Access Code: [email or web address] URL: [email or web address] Date: 02/12/2023 Prepared by: Greig Castilla Fateh Kindle  Exercises - Supine Shoulder Flexion Extension AAROM with Dowel   - 3 x daily - 7 x weekly - 2 sets - 10 reps - Supine Shoulder External Rotation with Dowel  - 3 x daily - 7 x weekly - 2 sets - 10 reps - Standing 'L' Stretch at Counter  - 3 x daily - 7 x weekly - 2 sets - 10 reps - 5-10 second hold - Supine Single Arm Scapular Protraction  - 1 x daily - 7 x weekly - 3 sets - 10 reps - Sidelying Shoulder Abduction Palm Forward (Mirrored)  - 1 x daily - 7 x weekly - 3 sets - 10 reps - Isometric Shoulder Flexion at Wall (Mirrored)  - 2 x daily - 7 x weekly - 5 reps - 10 second hold - Isometric Shoulder Abduction at Wall  - 5 x daily - 7 x weekly - 5 reps - 10 second hold - Isometric Shoulder Extension at Wall  - 2 x daily - 7 x weekly - 5 reps - 10 second hold - Standing Isometric Shoulder Internal Rotation at Doorway  -  2 x daily - 7 x weekly - 5 reps - 10 second hold - Isometric Shoulder External Rotation at Wall  - 2 x daily - 7 x weekly - 5 reps - 10 second hold - Supine Single Arm Shoulder Protraction  - 1 x daily - 7 x weekly - 3 sets - 10 reps - Prone Shoulder Extension - Single Arm (Mirrored)  - 1 x daily - 7 x weekly - 3 sets - 10 reps - Shoulder Flexion Wall Slide with Towel  - 1 x daily - 7 x weekly - 10 reps - Supine Shoulder Flexion AROM  - 2 x daily - 7 x weekly - 2 sets - 10 reps - Standing Shoulder Flexion Wall Walk  - 1-2 x daily - 7 x weekly - 1 sets - 5 reps - Standing Shoulder Row with Anchored Resistance  - 1 x daily - 7 x weekly - 3 sets - 10 reps - Supine Shoulder External Rotation with Resistance  - 1 x daily - 7 x weekly - 2 sets - 10 reps - Shoulder extension with resistance - Neutral  - 1 x daily - 7 x weekly - 2 sets - 10 reps   ASSESSMENT:   CLINICAL IMPRESSION: Patient with supine AAROM in flexion to 145. Limited ROM into horizontal abduction in prone - discontinued due to poor form for now. Able to complete additional resistance exercises for periscap musculature. Patient will continue to benefit from physical therapy in order to  improve function and reduce impairment.     OBJECTIVE IMPAIRMENTS: decreased activity tolerance, decreased endurance, decreased ROM, decreased strength, hypomobility, impaired flexibility, impaired UE functional use, and pain.    ACTIVITY LIMITATIONS: carrying, lifting, bathing, dressing, reach over head, and hygiene/grooming   PARTICIPATION LIMITATIONS: meal prep, cleaning, laundry, driving, shopping, and community activity   PERSONAL FACTORS: 3+ comorbidities: cervical spondylosis, osteoporosis, R shoulder pain  are also affecting patient's functional outcome.    REHAB POTENTIAL: Good   CLINICAL DECISION MAKING: Stable/uncomplicated   EVALUATION COMPLEXITY: Low     GOALS:   SHORT TERM GOALS:    Pt will be independent and compliant with HEP for improved pain, ROM, strength, and function  Baseline: Goal status: GOAL MET  Target date:  01/24/2023     2.  Pt will demo improved PROM to 110 deg in flexion and 30 deg in ER for improved shoulder mobility and tightness Baseline:  Goal status: GOAL MET  11/19 Target date:  01/17/2023       3.  Pt will demo L shoulder AAROM to 130 deg in flexion (supine) and 30 deg in ER for improved stiffness and mobility Baseline:  Goal status: GOAL MET  11/19 Target date:  01/31/2023     4.  Pt will be able to actively perform R shoulder flexion AROM to at least 90 deg in supine.  Baseline:  Goal status: GOAL MET  11/19 Target date:  01/24/2023     5.  Pt will be able to actively elevate R shoulder > 90 deg without significant shoulder hike.  Baseline:  Goal status: ONGOING Target date:  02/28/2023     6.  Pt will be able to perform her self care activities with no > than minimal difficulty.   Baseline:  Goal status: PROGRESSING  11/19 Target date:  03/21/2023     LONG TERM GOALS: Target date:   04/18/2023     Pt will be able to perform her ADLs and IADLs  without significant difficulty and pain.  Baseline:  Goal status:  INITIAL   2.   Pt will demo L shoulder AROM to be [facility] t/o for performance of ADLs and IADLs  Baseline:  Goal status: INITIAL   3.  Pt will be able to perform her normal reaching and functional overhead activities without significant limitation and pain. Baseline:  Goal status: INITIAL   4.  Pt will be able to reach into an overhead cabinet without significant difficulty.  Baseline:  Goal status: INITIAL Target date:  04/04/2023     5.  Pt will progress with strengthening exercises per protocol without adverse effects in order to perform her normal functional lifting/carrying activities and household chores.  Baseline:  Goal status: INITIAL       PLAN:   PT FREQUENCY: 2x/week   PT DURATION: other: 9 weeks   PLANNED INTERVENTIONS: Therapeutic exercises, Therapeutic activity, Neuromuscular re-education, Patient/Family education, Self Care, Aquatic Therapy, Dry Needling, Electrical stimulation, Cryotherapy, Moist heat, Taping, Manual therapy, and Re-evaluation   PLAN FOR NEXT SESSION: Cont per Dr. Suzan Nailer RSA protocol.       Reola Mosher Areanna Gengler, PT 02/18/2023, 11:08 AM

## 2023-02-22 ENCOUNTER — Ambulatory Visit (HOSPITAL_BASED_OUTPATIENT_CLINIC_OR_DEPARTMENT_OTHER): Payer: Medicare HMO | Admitting: Physical Therapy

## 2023-02-22 ENCOUNTER — Encounter (HOSPITAL_BASED_OUTPATIENT_CLINIC_OR_DEPARTMENT_OTHER): Payer: Self-pay | Admitting: Physical Therapy

## 2023-02-22 DIAGNOSIS — M25612 Stiffness of left shoulder, not elsewhere classified: Secondary | ICD-10-CM | POA: Diagnosis not present

## 2023-02-22 DIAGNOSIS — M6281 Muscle weakness (generalized): Secondary | ICD-10-CM | POA: Diagnosis not present

## 2023-02-22 DIAGNOSIS — M25512 Pain in left shoulder: Secondary | ICD-10-CM

## 2023-02-22 NOTE — Therapy (Signed)
OUTPATIENT PHYSICAL THERAPY TREATMENT       Patient Name: Heidi Shah MRN: [id number] DOB:1945/09/28, 77 y.o., female Today's Date: 02/22/2023  END OF SESSION:  PT End of Session - 02/22/23 1152     Visit Number 12    Number of Visits 28    Date for PT Re-Evaluation 04/18/23    Authorization Type [employer] MCR    PT Start Time 1105    PT Stop Time 1149    PT Time Calculation (min) 44 min    Activity Tolerance Patient tolerated treatment well    Behavior During Therapy WFL for tasks assessed/performed               Past Medical History:  Diagnosis Date   Allergy    Anxiety    Arthritis    Borderline glaucoma    Cataract    surgery - Bilateral cataracts removed   Cervical spondylolysis    Chicken pox    Colon polyps    Depression    Diverticulitis    Diverticulosis    Esophageal spasm    Esophageal stricture    Family history of adverse reaction to anesthesia    difficult time waking mother up   GERD (gastroesophageal reflux disease)    Heart murmur    Hypertension    Osteoporosis    Traumatic complete tear of left rotator cuff    Past Surgical History:  Procedure Laterality Date   cataract surgery Bilateral 1996   COLONOSCOPY  2022   EYE SURGERY  2000   detached L retina   HERNIA REPAIR Bilateral 1990   inguinal   NECK SURGERY     age 85-trimmed a muscle due to not being able to hold her head up   POLYPECTOMY  2022   REVERSE SHOULDER ARTHROPLASTY Left 12/16/2022   Procedure: LEFT REVERSE SHOULDER ARTHROPLASTY;  Surgeon: Huel Cote, MD;  Location: [facility];  Service: Orthopedics;  Laterality: Left;   SHOULDER ARTHROSCOPY WITH ROTATOR CUFF REPAIR AND OPEN BICEPS TENODESIS Left 12/10/2021   Procedure: LEFT SHOULDER ARTHROSCOPY WITH ROTATOR CUFF REPAIR AND OPEN BICEPS TENODESIS;  Surgeon: Huel Cote, MD;  Location: [facility];  Service: Orthopedics;  Laterality: Left;   UPPER GASTROINTESTINAL ENDOSCOPY  2022   Patient Active Problem List    Diagnosis Date Noted   Traumatic complete tear of left rotator cuff    Cervical spondylosis with myelopathy and radiculopathy 03/14/2020   Neck pain 03/14/2020   Aortic heart murmur 02/25/2020   Age-related osteoporosis without current pathological fracture 04/04/2019   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   Constipation 03/05/2013   GERD (gastroesophageal reflux disease) 01/19/2013   Esophageal spasm 01/19/2013   Dysphagia 01/19/2013   Diverticulitis of colon (without mention of hemorrhage)(562.11) 11/23/2012   History of depression 11/23/2012   Essential hypertension, benign 11/23/2012     THERAPY DIAG:  Left shoulder pain, unspecified chronicity  Stiffness of left shoulder, not elsewhere classified  Muscle weakness (generalized)  REFERRING PROVIDER: Huel Cote, MD   REFERRING DIAG: S46.012A (ICD-10-CM) - Traumatic complete tear of left rotator cuff, initial encounter    S/p L RSA   THERAPY DIAG:  Left shoulder pain, unspecified chronicity   Stiffness of left shoulder, not elsewhere classified   Muscle weakness (generalized)   Rationale for Evaluation and Treatment: Rehabilitation   ONSET DATE: DOS 12/16/2022   SUBJECTIVE:  SUBJECTIVE STATEMENT: Pt is 9 weeks and 5 days s/p L RSA.  She states she had difficulty sleeping last night due to shoulder pain.  She states she overworked her shoulder by cleaning her home after having her bathrooms remodeled.  Pt had 2-3/10 pain last night.  Pt denies any adverse effects after prior Rx.  Pt reports compliance with and tolerance of HEP.        Hand dominance: Right   PERTINENT HISTORY: -L shoulder reverse shoulder arthroplasty (RSA) and biceps tenodesis on 12/16/2022.  -L shoulder RCR and biceps tenodesis in 11/2021. -Arthritis, cervical  spondylosis, osteoporosis, HTN -Pt states she does have 2 small tears in her R shoulder and does some pain in R shoulder.     PAIN:  NPRS:  1/10 current, 3-4/10 worst, 0/10 best Pt denies soreness.    PRECAUTIONS: Other: Tears and pain in R shoulder, osteoporosis       WEIGHT BEARING RESTRICTIONS: Yes L UE   FALLS:  Has patient fallen in last 6 months? No   LIVING ENVIRONMENT: Lives with: lives alone Lives in: 1 story home Stairs: has steps to enter home and bilat rails on one entrance     OCCUPATION: Pt is retired.   PLOF: Independent   PATIENT GOALS:  to regain 100% movement in arm and use it as normal     OBJECTIVE:    DIAGNOSTIC FINDINGS:  Pt is post op.                     TODAY'S TREATMENT:                                                                                                                                         02/22/23 Pt received L shoulder PROM in flex, abd, and ER per pt and tissue tolerance w/n protocol ranges Pulleys in flex and scaption x20 each S/L ER with manual assist 3x10 Prone row 2x10 Prone extension to neutral 2x10 Seated on incline bench:  Shoulder flexion AAROM with wand x 5 reps at setting 5, x10 reps at setting 4  Shoulder flexion AROM 2x10 at setting 4 Standing wall walks in flexion x 5 reps   02/18/23 Manual: PROM flexion, abduction, ER, Supine band ER YTB 2 x 10 Sidelying ER with manual assist 3 x 10 Prone shoulder extension 2 x 10  Prone row 2 x 10 Prone horizontal abduction  small range 1 x 10 discontinued due to mechanics Standing shoulder flexion with YTB band in hands 2 x 10 limited range Standing row YTB 3 x 10 Standing shoulder extension YTB 2 x 10  Supine shoulder flexion 2# wand 2 x 10   02/15/23 Pt received L shoulder PROM in flexion, abd, and ER per pt and tissue tolerance w/n protocol ranges Supine wand ER x 10 reps S/L ER with manual assist x 10 reps Standing  wall walks in flexion x 7 reps Scap  retraction with YTB 2 x 10 reps Supine wand flexion with head elevated on pillows x 10 reps Supine active flexion with head elevated on pillows x 10 reps   FOTO: Initial/Current:  41 / 55 with a goal of 57.   02/12/23 Manual: PROM flexion, abduction, ER, grade II inferior glides in flexion/abduction Supine punches 1# 3 x 10 Supine shoulder flexion with YTB in hands 3 x 10  Standing row YTB 3 x 10 Band IR YTB 2 x 10  Sidelying ER with manual assist 3 x 10 Supine band ER YTB 1 x 10   02/09/23 Pt received L shoulder PROM in flexion, scaption, abd, and ER per pt and tissue tolerance w/n protocol ranges PT worked on form with supine wand ER.  Pt performed 2x10 S/L abduction to 90 deg 2x10 Supine wand flexion with head elevated on pillows x 10 reps Supine active flexion with head elevated on pillows 2 x 10 reps Standing wall walks in flexion x 5 reps and x 2 reps Standing wand ER 2x10  PT reviewed HEP and updated HEP.  Pt received a HEP handout and was educated in correct form and appropriate frequency.  PT instructed pt to not perform into a painful or tight range.    PATIENT EDUCATION: Education details: surgical and protocol limitations and restrictions, objective findings, Dx, relevant anatomy, POC, goal progress, HEP, and exercise form. Person educated: Patient Education method: Explanation, Demonstration, Tactile cues, Verbal cues Education comprehension: verbalized understanding, returned demonstration, verbal cues required, tactile cues required, and needs further education   HOME EXERCISE PROGRAM: Access Code: [email or web address] URL: [email or web address] Date: 02/12/2023 Prepared by: Greig Castilla Zaunegger  Exercises - Supine Shoulder Flexion Extension AAROM with Dowel  - 3 x daily - 7 x weekly - 2 sets - 10 reps - Supine Shoulder External Rotation with Dowel  - 3 x daily - 7 x weekly - 2 sets - 10 reps - Standing 'L' Stretch at Counter  - 3 x daily - 7 x weekly - 2 sets  - 10 reps - 5-10 second hold - Supine Single Arm Scapular Protraction  - 1 x daily - 7 x weekly - 3 sets - 10 reps - Sidelying Shoulder Abduction Palm Forward (Mirrored)  - 1 x daily - 7 x weekly - 3 sets - 10 reps - Isometric Shoulder Flexion at Wall (Mirrored)  - 2 x daily - 7 x weekly - 5 reps - 10 second hold - Isometric Shoulder Abduction at Wall  - 5 x daily - 7 x weekly - 5 reps - 10 second hold - Isometric Shoulder Extension at Wall  - 2 x daily - 7 x weekly - 5 reps - 10 second hold - Standing Isometric Shoulder Internal Rotation at Doorway  - 2 x daily - 7 x weekly - 5 reps - 10 second hold - Isometric Shoulder External Rotation at Wall  - 2 x daily - 7 x weekly - 5 reps - 10 second hold - Supine Single Arm Shoulder Protraction  - 1 x daily - 7 x weekly - 3 sets - 10 reps - Prone Shoulder Extension - Single Arm (Mirrored)  - 1 x daily - 7 x weekly - 3 sets - 10 reps - Shoulder Flexion Wall Slide with Towel  - 1 x daily - 7 x weekly - 10 reps - Supine Shoulder Flexion AROM  - 2 x daily - 7 x weekly -  2 sets - 10 reps - Standing Shoulder Flexion Wall Walk  - 1-2 x daily - 7 x weekly - 1 sets - 5 reps - Standing Shoulder Row with Anchored Resistance  - 1 x daily - 7 x weekly - 3 sets - 10 reps - Supine Shoulder External Rotation with Resistance  - 1 x daily - 7 x weekly - 2 sets - 10 reps - Shoulder extension with resistance - Neutral  - 1 x daily - 7 x weekly - 2 sets - 10 reps   ASSESSMENT:   CLINICAL IMPRESSION: Patient is progressing with L shoulder ROM.  PT had pt perform shoulder flexion AAROM and AROM on incline bench at different angles to progress with UE elevation ROM against gravity.  Pt tolerated exercises well.  She performed exercises per protocol well with cuing and instruction in correct form.  Pt was fatigued with standing wall walks in flexion and stopped after 5 reps.  Pt responded well to Rx reporting soreness, but no pain after Rx.  Patient will continue to benefit  from continued skilled PT in order to improve ROM, strength, function and to address ongoing goals.     OBJECTIVE IMPAIRMENTS: decreased activity tolerance, decreased endurance, decreased ROM, decreased strength, hypomobility, impaired flexibility, impaired UE functional use, and pain.    ACTIVITY LIMITATIONS: carrying, lifting, bathing, dressing, reach over head, and hygiene/grooming   PARTICIPATION LIMITATIONS: meal prep, cleaning, laundry, driving, shopping, and community activity   PERSONAL FACTORS: 3+ comorbidities: cervical spondylosis, osteoporosis, R shoulder pain  are also affecting patient's functional outcome.    REHAB POTENTIAL: Good   CLINICAL DECISION MAKING: Stable/uncomplicated   EVALUATION COMPLEXITY: Low     GOALS:   SHORT TERM GOALS:    Pt will be independent and compliant with HEP for improved pain, ROM, strength, and function  Baseline: Goal status: GOAL MET  Target date:  01/24/2023     2.  Pt will demo improved PROM to 110 deg in flexion and 30 deg in ER for improved shoulder mobility and tightness Baseline:  Goal status: GOAL MET  11/19 Target date:  01/17/2023       3.  Pt will demo L shoulder AAROM to 130 deg in flexion (supine) and 30 deg in ER for improved stiffness and mobility Baseline:  Goal status: GOAL MET  11/19 Target date:  01/31/2023     4.  Pt will be able to actively perform R shoulder flexion AROM to at least 90 deg in supine.  Baseline:  Goal status: GOAL MET  11/19 Target date:  01/24/2023     5.  Pt will be able to actively elevate R shoulder > 90 deg without significant shoulder hike.  Baseline:  Goal status: ONGOING Target date:  02/28/2023     6.  Pt will be able to perform her self care activities with no > than minimal difficulty.   Baseline:  Goal status: PROGRESSING  11/19 Target date:  03/21/2023     LONG TERM GOALS: Target date:   04/18/2023     Pt will be able to perform her ADLs and IADLs without  significant difficulty and pain.  Baseline:  Goal status: INITIAL   2.   Pt will demo L shoulder AROM to be [facility] t/o for performance of ADLs and IADLs  Baseline:  Goal status: INITIAL   3.  Pt will be able to perform her normal reaching and functional overhead activities without significant limitation and pain. Baseline:  Goal status: INITIAL   4.  Pt will be able to reach into an overhead cabinet without significant difficulty.  Baseline:  Goal status: INITIAL Target date:  04/04/2023     5.  Pt will progress with strengthening exercises per protocol without adverse effects in order to perform her normal functional lifting/carrying activities and household chores.  Baseline:  Goal status: INITIAL       PLAN:   PT FREQUENCY: 2x/week   PT DURATION: other: 9 weeks   PLANNED INTERVENTIONS: Therapeutic exercises, Therapeutic activity, Neuromuscular re-education, Patient/Family education, Self Care, Aquatic Therapy, Dry Needling, Electrical stimulation, Cryotherapy, Moist heat, Taping, Manual therapy, and Re-evaluation   PLAN FOR NEXT SESSION: Cont per Dr. Suzan Nailer RSA protocol.       Audie Clear III PT, DPT 02/22/23 10:04 PM

## 2023-03-01 ENCOUNTER — Encounter (HOSPITAL_BASED_OUTPATIENT_CLINIC_OR_DEPARTMENT_OTHER): Payer: Self-pay | Admitting: Physical Therapy

## 2023-03-01 ENCOUNTER — Ambulatory Visit (HOSPITAL_BASED_OUTPATIENT_CLINIC_OR_DEPARTMENT_OTHER): Payer: Medicare HMO | Attending: Orthopaedic Surgery | Admitting: Physical Therapy

## 2023-03-01 DIAGNOSIS — M25512 Pain in left shoulder: Secondary | ICD-10-CM

## 2023-03-01 DIAGNOSIS — M6281 Muscle weakness (generalized): Secondary | ICD-10-CM | POA: Diagnosis not present

## 2023-03-01 DIAGNOSIS — M25612 Stiffness of left shoulder, not elsewhere classified: Secondary | ICD-10-CM

## 2023-03-01 NOTE — Therapy (Signed)
OUTPATIENT PHYSICAL THERAPY TREATMENT       Patient Name: Heidi Shah MRN: [id number] DOB:[birth year], 77 y.o., female Today's Date: 03/01/2023  END OF SESSION:  PT End of Session - 03/01/23 1104     Visit Number 13    Number of Visits 28    Date for PT Re-Evaluation 04/18/23    Authorization Type [employer] MCR    PT Start Time 1104    PT Stop Time 1144    PT Time Calculation (min) 40 min    Activity Tolerance Patient tolerated treatment well    Behavior During Therapy WFL for tasks assessed/performed               Past Medical History:  Diagnosis Date   Allergy    Anxiety    Arthritis    Borderline glaucoma    Cataract    surgery - Bilateral cataracts removed   Cervical spondylolysis    Chicken pox    Colon polyps    Depression    Diverticulitis    Diverticulosis    Esophageal spasm    Esophageal stricture    Family history of adverse reaction to anesthesia    difficult time waking mother up   GERD (gastroesophageal reflux disease)    Heart murmur    Hypertension    Osteoporosis    Traumatic complete tear of left rotator cuff    Past Surgical History:  Procedure Laterality Date   cataract surgery Bilateral 1996   COLONOSCOPY  2022   EYE SURGERY  2000   detached L retina   HERNIA REPAIR Bilateral 1990   inguinal   NECK SURGERY     age 43-trimmed a muscle due to not being able to hold her head up   POLYPECTOMY  2022   REVERSE SHOULDER ARTHROPLASTY Left 12/16/2022   Procedure: LEFT REVERSE SHOULDER ARTHROPLASTY;  Surgeon: Huel Cote, MD;  Location: [facility];  Service: Orthopedics;  Laterality: Left;   SHOULDER ARTHROSCOPY WITH ROTATOR CUFF REPAIR AND OPEN BICEPS TENODESIS Left 12/10/2021   Procedure: LEFT SHOULDER ARTHROSCOPY WITH ROTATOR CUFF REPAIR AND OPEN BICEPS TENODESIS;  Surgeon: Huel Cote, MD;  Location: [facility];  Service: Orthopedics;  Laterality: Left;   UPPER GASTROINTESTINAL ENDOSCOPY  2022   Patient Active Problem List    Diagnosis Date Noted   Traumatic complete tear of left rotator cuff    Cervical spondylosis with myelopathy and radiculopathy 03/14/2020   Neck pain 03/14/2020   Aortic heart murmur 02/25/2020   Age-related osteoporosis without current pathological fracture 04/04/2019   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   Constipation 03/05/2013   GERD (gastroesophageal reflux disease) 01/19/2013   Esophageal spasm 01/19/2013   Dysphagia 01/19/2013   Diverticulitis of colon (without mention of hemorrhage)(562.11) 11/23/2012   History of depression 11/23/2012   Essential hypertension, benign 11/23/2012     THERAPY DIAG:  Left shoulder pain, unspecified chronicity  Stiffness of left shoulder, not elsewhere classified  Muscle weakness (generalized)  REFERRING PROVIDER: Huel Cote, MD   REFERRING DIAG: S46.012A (ICD-10-CM) - Traumatic complete tear of left rotator cuff, initial encounter    S/p L RSA   THERAPY DIAG:  Left shoulder pain, unspecified chronicity   Stiffness of left shoulder, not elsewhere classified   Muscle weakness (generalized)   Rationale for Evaluation and Treatment: Rehabilitation   ONSET DATE: DOS 12/16/2022   SUBJECTIVE:  SUBJECTIVE STATEMENT: Pt is 10 weeks post of L R TSA. Patient states shoulder gets sore. Stiff in the morning, sore in the evening. HEP going well. Using shoulder more at home.      Hand dominance: Right   PERTINENT HISTORY: -L shoulder reverse shoulder arthroplasty (RSA) and biceps tenodesis on 12/16/2022.  -L shoulder RCR and biceps tenodesis in 11/2021. -Arthritis, cervical spondylosis, osteoporosis, HTN -Pt states she does have 2 small tears in her R shoulder and does some pain in R shoulder.     PAIN:  NPRS:  1/10 current, 3-4/10 worst, 0/10 best Pt denies  soreness.    PRECAUTIONS: Other: Tears and pain in R shoulder, osteoporosis       WEIGHT BEARING RESTRICTIONS: Yes L UE   FALLS:  Has patient fallen in last 6 months? No   LIVING ENVIRONMENT: Lives with: lives alone Lives in: 1 story home Stairs: has steps to enter home and bilat rails on one entrance     OCCUPATION: Pt is retired.   PLOF: Independent   PATIENT GOALS:  to regain 100% movement in arm and use it as normal     OBJECTIVE:    DIAGNOSTIC FINDINGS:  Pt is post op.      DIAGNOSTIC FINDINGS:  Pt is post op.     UPPER EXTREMITY ROM:     ROM Right eval Left eval Left 01/01/23 Left 01/06/23 01/13/23 01/20/23 01/27/23 02/02/23 Left 02/12/23 02/15/23 Left 02/18/23 03/01/23  Shoulder flexion 164 AROM PROM: 85 AAROM 92 AAROM 106  117 at EOS 120 127 at end of session 117 12 at end of session PROM: 130 AROM in supine:  125   Supine 141 (Supine) PROM:  143 AAROM: 140 AROM:  137 145 147  Shoulder scaption 158 AROM                       Shoulder abduction                         Shoulder adduction                         Shoulder internal rotation                         Shoulder external rotation   PROM: 21 25         AAROM: 41 deg  45 PROM:  58 deg AAROM:  52 deg   47  Elbow flexion                         Elbow extension                         Wrist flexion                         Wrist extension                         Wrist ulnar deviation                         Wrist radial deviation                         Wrist pronation  Wrist supination                         (Blank rows = not tested)                 TODAY'S TREATMENT:                                                                                                                                         03/01/23 Pulley flexion/scaption 3 minutes PROM ER, AAROM ER with cane Prone shoulder extension 1# 2 x 10 Prone row 1# 2 x 10  Standing Row RTB 2 x 10  Standing  shoulder extension RTB 2 x 10  Prone horizontal abduction  small range 1 x 10 discontinued due to mechanics Supine shoulder horizontal abduction YTB 3 x 15 Standing shoulder ABC with ball at wall 2x  02/22/23 Pt received L shoulder PROM in flex, abd, and ER per pt and tissue tolerance w/n protocol ranges Pulleys in flex and scaption x20 each S/L ER with manual assist 3x10 Prone row 2x10 Prone extension to neutral 2x10 Seated on incline bench:  Shoulder flexion AAROM with wand x 5 reps at setting 5, x10 reps at setting 4  Shoulder flexion AROM 2x10 at setting 4 Standing wall walks in flexion x 5 reps   02/18/23 Manual: PROM flexion, abduction, ER, Supine band ER YTB 2 x 10 Sidelying ER with manual assist 3 x 10 Prone shoulder extension 2 x 10  Prone row 2 x 10 Prone horizontal abduction  small range 1 x 10 discontinued due to mechanics Standing shoulder flexion with YTB band in hands 2 x 10 limited range Standing row YTB 3 x 10 Standing shoulder extension YTB 2 x 10  Supine shoulder flexion 2# wand 2 x 10   02/15/23 Pt received L shoulder PROM in flexion, abd, and ER per pt and tissue tolerance w/n protocol ranges Supine wand ER x 10 reps S/L ER with manual assist x 10 reps Standing wall walks in flexion x 7 reps Scap retraction with YTB 2 x 10 reps Supine wand flexion with head elevated on pillows x 10 reps Supine active flexion with head elevated on pillows x 10 reps   FOTO: Initial/Current:  41 / 55 with a goal of 57.   02/12/23 Manual: PROM flexion, abduction, ER, grade II inferior glides in flexion/abduction Supine punches 1# 3 x 10 Supine shoulder flexion with YTB in hands 3 x 10  Standing row YTB 3 x 10 Band IR YTB 2 x 10  Sidelying ER with manual assist 3 x 10 Supine band ER YTB 1 x 10   02/09/23 Pt received L shoulder PROM in flexion, scaption, abd, and ER per pt and tissue tolerance w/n protocol ranges PT worked on form with supine wand ER.  Pt performed  2x10 S/L abduction to 90  deg 2x10 Supine wand flexion with head elevated on pillows x 10 reps Supine active flexion with head elevated on pillows 2 x 10 reps Standing wall walks in flexion x 5 reps and x 2 reps Standing wand ER 2x10  PT reviewed HEP and updated HEP.  Pt received a HEP handout and was educated in correct form and appropriate frequency.  PT instructed pt to not perform into a painful or tight range.    PATIENT EDUCATION: Education details: surgical and protocol limitations and restrictions, objective findings, Dx, relevant anatomy, POC, goal progress, HEP, and exercise form. Person educated: Patient Education method: Explanation, Demonstration, Tactile cues, Verbal cues Education comprehension: verbalized understanding, returned demonstration, verbal cues required, tactile cues required, and needs further education   HOME EXERCISE PROGRAM: Access Code: [email or web address] URL: [email or web address] Date: 02/12/2023 Prepared by: Greig Castilla Romano Stigger  Exercises - Supine Shoulder Flexion Extension AAROM with Dowel  - 3 x daily - 7 x weekly - 2 sets - 10 reps - Supine Shoulder External Rotation with Dowel  - 3 x daily - 7 x weekly - 2 sets - 10 reps - Standing 'L' Stretch at Counter  - 3 x daily - 7 x weekly - 2 sets - 10 reps - 5-10 second hold - Supine Single Arm Scapular Protraction  - 1 x daily - 7 x weekly - 3 sets - 10 reps - Sidelying Shoulder Abduction Palm Forward (Mirrored)  - 1 x daily - 7 x weekly - 3 sets - 10 reps - Isometric Shoulder Flexion at Wall (Mirrored)  - 2 x daily - 7 x weekly - 5 reps - 10 second hold - Isometric Shoulder Abduction at Wall  - 5 x daily - 7 x weekly - 5 reps - 10 second hold - Isometric Shoulder Extension at Wall  - 2 x daily - 7 x weekly - 5 reps - 10 second hold - Standing Isometric Shoulder Internal Rotation at Doorway  - 2 x daily - 7 x weekly - 5 reps - 10 second hold - Isometric Shoulder External Rotation at Wall  - 2 x daily - 7  x weekly - 5 reps - 10 second hold - Supine Single Arm Shoulder Protraction  - 1 x daily - 7 x weekly - 3 sets - 10 reps - Prone Shoulder Extension - Single Arm (Mirrored)  - 1 x daily - 7 x weekly - 3 sets - 10 reps - Shoulder Flexion Wall Slide with Towel  - 1 x daily - 7 x weekly - 10 reps - Supine Shoulder Flexion AROM  - 2 x daily - 7 x weekly - 2 sets - 10 reps - Standing Shoulder Flexion Wall Walk  - 1-2 x daily - 7 x weekly - 1 sets - 5 reps - Standing Shoulder Row with Anchored Resistance  - 1 x daily - 7 x weekly - 3 sets - 10 reps - Supine Shoulder External Rotation with Resistance  - 1 x daily - 7 x weekly - 2 sets - 10 reps - Shoulder extension with resistance - Neutral  - 1 x daily - 7 x weekly - 2 sets - 10 reps  03/01/23- Shoulder Alphabet with Ball at Wall  - 1 x daily - 7 x weekly - 2-3 reps   ASSESSMENT:   CLINICAL IMPRESSION: Patient progressing well overall. Tolerates progression with light bands with good mechanics. Impaired mechanics with horizontal abduction in prone with limited range and compensations, improves with band in supine.  Intermittent cueing for mechanics and positioning with good carry over. Patient will continue to benefit from physical therapy in order to improve function and reduce impairment.     OBJECTIVE IMPAIRMENTS: decreased activity tolerance, decreased endurance, decreased ROM, decreased strength, hypomobility, impaired flexibility, impaired UE functional use, and pain.    ACTIVITY LIMITATIONS: carrying, lifting, bathing, dressing, reach over head, and hygiene/grooming   PARTICIPATION LIMITATIONS: meal prep, cleaning, laundry, driving, shopping, and community activity   PERSONAL FACTORS: 3+ comorbidities: cervical spondylosis, osteoporosis, R shoulder pain  are also affecting patient's functional outcome.    REHAB POTENTIAL: Good   CLINICAL DECISION MAKING: Stable/uncomplicated   EVALUATION COMPLEXITY: Low     GOALS:   SHORT TERM  GOALS:    Pt will be independent and compliant with HEP for improved pain, ROM, strength, and function  Baseline: Goal status: GOAL MET  Target date:  01/24/2023     2.  Pt will demo improved PROM to 110 deg in flexion and 30 deg in ER for improved shoulder mobility and tightness Baseline:  Goal status: GOAL MET  11/19 Target date:  01/17/2023       3.  Pt will demo L shoulder AAROM to 130 deg in flexion (supine) and 30 deg in ER for improved stiffness and mobility Baseline:  Goal status: GOAL MET  11/19 Target date:  01/31/2023     4.  Pt will be able to actively perform R shoulder flexion AROM to at least 90 deg in supine.  Baseline:  Goal status: GOAL MET  11/19 Target date:  01/24/2023     5.  Pt will be able to actively elevate R shoulder > 90 deg without significant shoulder hike.  Baseline:  Goal status: ONGOING Target date:  02/28/2023     6.  Pt will be able to perform her self care activities with no > than minimal difficulty.   Baseline:  Goal status: PROGRESSING  11/19 Target date:  03/21/2023     LONG TERM GOALS: Target date:   04/18/2023     Pt will be able to perform her ADLs and IADLs without significant difficulty and pain.  Baseline:  Goal status: INITIAL   2.   Pt will demo L shoulder AROM to be [facility] t/o for performance of ADLs and IADLs  Baseline:  Goal status: INITIAL   3.  Pt will be able to perform her normal reaching and functional overhead activities without significant limitation and pain. Baseline:  Goal status: INITIAL   4.  Pt will be able to reach into an overhead cabinet without significant difficulty.  Baseline:  Goal status: INITIAL Target date:  04/04/2023     5.  Pt will progress with strengthening exercises per protocol without adverse effects in order to perform her normal functional lifting/carrying activities and household chores.  Baseline:  Goal status: INITIAL       PLAN:   PT FREQUENCY: 2x/week   PT DURATION:  other: 9 weeks   PLANNED INTERVENTIONS: Therapeutic exercises, Therapeutic activity, Neuromuscular re-education, Patient/Family education, Self Care, Aquatic Therapy, Dry Needling, Electrical stimulation, Cryotherapy, Moist heat, Taping, Manual therapy, and Re-evaluation   PLAN FOR NEXT SESSION: Cont per Dr. Suzan Nailer RSA protocol.        Reola Mosher Collier Monica, PT 03/01/2023, 11:05 AM

## 2023-03-03 ENCOUNTER — Encounter (HOSPITAL_BASED_OUTPATIENT_CLINIC_OR_DEPARTMENT_OTHER): Payer: Self-pay | Admitting: Physical Therapy

## 2023-03-03 ENCOUNTER — Ambulatory Visit (HOSPITAL_BASED_OUTPATIENT_CLINIC_OR_DEPARTMENT_OTHER): Payer: Medicare HMO | Admitting: Physical Therapy

## 2023-03-03 DIAGNOSIS — M6281 Muscle weakness (generalized): Secondary | ICD-10-CM

## 2023-03-03 DIAGNOSIS — M25512 Pain in left shoulder: Secondary | ICD-10-CM

## 2023-03-03 DIAGNOSIS — M25612 Stiffness of left shoulder, not elsewhere classified: Secondary | ICD-10-CM | POA: Diagnosis not present

## 2023-03-03 NOTE — Therapy (Signed)
OUTPATIENT PHYSICAL THERAPY TREATMENT       Patient Name: Heidi Shah MRN: [id number] DOB:10-17-1945, 77 y.o., female Today's Date: 03/03/2023  END OF SESSION:  PT End of Session - 03/03/23 1436     Visit Number 14    Number of Visits 28    Date for PT Re-Evaluation 04/18/23    Authorization Type [employer] MCR    PT Start Time 1436    PT Stop Time 1515    PT Time Calculation (min) 39 min    Activity Tolerance Patient tolerated treatment well    Behavior During Therapy WFL for tasks assessed/performed               Past Medical History:  Diagnosis Date   Allergy    Anxiety    Arthritis    Borderline glaucoma    Cataract    surgery - Bilateral cataracts removed   Cervical spondylolysis    Chicken pox    Colon polyps    Depression    Diverticulitis    Diverticulosis    Esophageal spasm    Esophageal stricture    Family history of adverse reaction to anesthesia    difficult time waking mother up   GERD (gastroesophageal reflux disease)    Heart murmur    Hypertension    Osteoporosis    Traumatic complete tear of left rotator cuff    Past Surgical History:  Procedure Laterality Date   cataract surgery Bilateral 1996   COLONOSCOPY  2022   EYE SURGERY  2000   detached L retina   HERNIA REPAIR Bilateral 1990   inguinal   NECK SURGERY     age 8-trimmed a muscle due to not being able to hold her head up   POLYPECTOMY  2022   REVERSE SHOULDER ARTHROPLASTY Left 12/16/2022   Procedure: LEFT REVERSE SHOULDER ARTHROPLASTY;  Surgeon: Huel Cote, MD;  Location: [facility];  Service: Orthopedics;  Laterality: Left;   SHOULDER ARTHROSCOPY WITH ROTATOR CUFF REPAIR AND OPEN BICEPS TENODESIS Left 12/10/2021   Procedure: LEFT SHOULDER ARTHROSCOPY WITH ROTATOR CUFF REPAIR AND OPEN BICEPS TENODESIS;  Surgeon: Huel Cote, MD;  Location: [facility];  Service: Orthopedics;  Laterality: Left;   UPPER GASTROINTESTINAL ENDOSCOPY  2022   Patient Active Problem List    Diagnosis Date Noted   Traumatic complete tear of left rotator cuff    Cervical spondylosis with myelopathy and radiculopathy 03/14/2020   Neck pain 03/14/2020   Aortic heart murmur 02/25/2020   Age-related osteoporosis without current pathological fracture 04/04/2019   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   Constipation 03/05/2013   GERD (gastroesophageal reflux disease) 01/19/2013   Esophageal spasm 01/19/2013   Dysphagia 01/19/2013   Diverticulitis of colon (without mention of hemorrhage)(562.11) 11/23/2012   History of depression 11/23/2012   Essential hypertension, benign 11/23/2012     THERAPY DIAG:  Left shoulder pain, unspecified chronicity  Stiffness of left shoulder, not elsewhere classified  Muscle weakness (generalized)  REFERRING PROVIDER: Huel Cote, MD   REFERRING DIAG: S46.012A (ICD-10-CM) - Traumatic complete tear of left rotator cuff, initial encounter    S/p L RSA   THERAPY DIAG:  Left shoulder pain, unspecified chronicity   Stiffness of left shoulder, not elsewhere classified   Muscle weakness (generalized)   Rationale for Evaluation and Treatment: Rehabilitation   ONSET DATE: DOS 12/16/2022   SUBJECTIVE:  SUBJECTIVE STATEMENT: Pt is 11 weeks post of L R TSA. Was sore after last session but got better after a day. Was able to wash her hair by herself.       Hand dominance: Right   PERTINENT HISTORY: -L shoulder reverse shoulder arthroplasty (RSA) and biceps tenodesis on 12/16/2022.  -L shoulder RCR and biceps tenodesis in 11/2021. -Arthritis, cervical spondylosis, osteoporosis, HTN -Pt states she does have 2 small tears in her R shoulder and does some pain in R shoulder.     PAIN:  NPRS:  0/10 current, 3-4/10 worst, 0/10 best Pt denies soreness.    PRECAUTIONS:  Other: Tears and pain in R shoulder, osteoporosis       WEIGHT BEARING RESTRICTIONS: Yes L UE   FALLS:  Has patient fallen in last 6 months? No   LIVING ENVIRONMENT: Lives with: lives alone Lives in: 1 story home Stairs: has steps to enter home and bilat rails on one entrance     OCCUPATION: Pt is retired.   PLOF: Independent   PATIENT GOALS:  to regain 100% movement in arm and use it as normal     OBJECTIVE:    DIAGNOSTIC FINDINGS:  Pt is post op.      DIAGNOSTIC FINDINGS:  Pt is post op.     UPPER EXTREMITY ROM:     ROM Right eval Left eval Left 01/01/23 Left 01/06/23 01/13/23 01/20/23 01/27/23 02/02/23 Left 02/12/23 02/15/23 Left 02/18/23 03/01/23 03/03/23  Shoulder flexion 164 AROM PROM: 85 AAROM 92 AAROM 106  117 at EOS 120 127 at end of session 117 12 at end of session PROM: 130 AROM in supine:  125   Supine 141 (Supine) PROM:  143 AAROM: 140 AROM:  137 145 147 151  Shoulder scaption 158 AROM                        Shoulder abduction                          Shoulder adduction                          Shoulder internal rotation                          Shoulder external rotation   PROM: 21 25         AAROM: 41 deg  45 PROM:  58 deg AAROM:  52 deg   47   Elbow flexion                          Elbow extension                          Wrist flexion                          Wrist extension                          Wrist ulnar deviation                          Wrist radial deviation  Wrist pronation                          Wrist supination                          (Blank rows = not tested)                 TODAY'S TREATMENT:                                                                                                                                         03/03/23 Standing Row RTB 3 x 10  Standing shoulder extension RTB 3 x 10  Pulley flexion/scaption 3 minutes Supine shoulder flexion on 35 degree incline 2 x 10, 45  degree incline 2 x 10, 45 degree incline 1# bar 1 x 10 Sidelying ER with manual assist 3 x 10 Supine shoulder horizontal abduction YTB 3 x 10 Standing functional IR with stick behind back 1 x 10  Standing lateral raise with elbow bent 2 x 10  03/01/23 Pulley flexion/scaption 3 minutes PROM ER, AAROM ER with cane Prone shoulder extension 1# 2 x 10 Prone row 1# 2 x 10  Standing Row RTB 2 x 10  Standing shoulder extension RTB 2 x 10  Prone horizontal abduction  small range 1 x 10 discontinued due to mechanics Supine shoulder horizontal abduction YTB 3 x 15 Standing shoulder ABC with ball at wall 2x  02/22/23 Pt received L shoulder PROM in flex, abd, and ER per pt and tissue tolerance w/n protocol ranges Pulleys in flex and scaption x20 each S/L ER with manual assist 3x10 Prone row 2x10 Prone extension to neutral 2x10 Seated on incline bench:  Shoulder flexion AAROM with wand x 5 reps at setting 5, x10 reps at setting 4  Shoulder flexion AROM 2x10 at setting 4 Standing wall walks in flexion x 5 reps   02/18/23 Manual: PROM flexion, abduction, ER, Supine band ER YTB 2 x 10 Sidelying ER with manual assist 3 x 10 Prone shoulder extension 2 x 10  Prone row 2 x 10 Prone horizontal abduction  small range 1 x 10 discontinued due to mechanics Standing shoulder flexion with YTB band in hands 2 x 10 limited range Standing row YTB 3 x 10 Standing shoulder extension YTB 2 x 10  Supine shoulder flexion 2# wand 2 x 10   02/15/23 Pt received L shoulder PROM in flexion, abd, and ER per pt and tissue tolerance w/n protocol ranges Supine wand ER x 10 reps S/L ER with manual assist x 10 reps Standing wall walks in flexion x 7 reps Scap retraction with YTB 2 x 10 reps Supine wand flexion with head elevated on pillows x 10 reps Supine active flexion with head elevated on pillows x 10 reps  FOTO: Initial/Current:  41 / 55 with a goal of 57.   02/12/23 Manual: PROM flexion, abduction, ER,  grade II inferior glides in flexion/abduction Supine punches 1# 3 x 10 Supine shoulder flexion with YTB in hands 3 x 10  Standing row YTB 3 x 10 Band IR YTB 2 x 10  Sidelying ER with manual assist 3 x 10 Supine band ER YTB 1 x 10   02/09/23 Pt received L shoulder PROM in flexion, scaption, abd, and ER per pt and tissue tolerance w/n protocol ranges PT worked on form with supine wand ER.  Pt performed 2x10 S/L abduction to 90 deg 2x10 Supine wand flexion with head elevated on pillows x 10 reps Supine active flexion with head elevated on pillows 2 x 10 reps Standing wall walks in flexion x 5 reps and x 2 reps Standing wand ER 2x10  PT reviewed HEP and updated HEP.  Pt received a HEP handout and was educated in correct form and appropriate frequency.  PT instructed pt to not perform into a painful or tight range.    PATIENT EDUCATION: Education details: surgical and protocol limitations and restrictions, objective findings, Dx, relevant anatomy, POC, goal progress, HEP, and exercise form. Person educated: Patient Education method: Explanation, Demonstration, Tactile cues, Verbal cues Education comprehension: verbalized understanding, returned demonstration, verbal cues required, tactile cues required, and needs further education   HOME EXERCISE PROGRAM: Access Code: [email or web address] URL: [email or web address] Date: 02/12/2023 Prepared by: Greig Castilla Eduardo Wurth  Exercises - Supine Shoulder Flexion Extension AAROM with Dowel  - 3 x daily - 7 x weekly - 2 sets - 10 reps - Supine Shoulder External Rotation with Dowel  - 3 x daily - 7 x weekly - 2 sets - 10 reps - Standing 'L' Stretch at Counter  - 3 x daily - 7 x weekly - 2 sets - 10 reps - 5-10 second hold - Supine Single Arm Scapular Protraction  - 1 x daily - 7 x weekly - 3 sets - 10 reps - Sidelying Shoulder Abduction Palm Forward (Mirrored)  - 1 x daily - 7 x weekly - 3 sets - 10 reps - Isometric Shoulder Flexion at Wall  (Mirrored)  - 2 x daily - 7 x weekly - 5 reps - 10 second hold - Isometric Shoulder Abduction at Wall  - 5 x daily - 7 x weekly - 5 reps - 10 second hold - Isometric Shoulder Extension at Wall  - 2 x daily - 7 x weekly - 5 reps - 10 second hold - Standing Isometric Shoulder Internal Rotation at Doorway  - 2 x daily - 7 x weekly - 5 reps - 10 second hold - Isometric Shoulder External Rotation at Wall  - 2 x daily - 7 x weekly - 5 reps - 10 second hold - Supine Single Arm Shoulder Protraction  - 1 x daily - 7 x weekly - 3 sets - 10 reps - Prone Shoulder Extension - Single Arm (Mirrored)  - 1 x daily - 7 x weekly - 3 sets - 10 reps - Shoulder Flexion Wall Slide with Towel  - 1 x daily - 7 x weekly - 10 reps - Supine Shoulder Flexion AROM  - 2 x daily - 7 x weekly - 2 sets - 10 reps - Standing Shoulder Flexion Wall Walk  - 1-2 x daily - 7 x weekly - 1 sets - 5 reps - Standing Shoulder Row with Anchored Resistance  - 1 x daily -  7 x weekly - 3 sets - 10 reps - Supine Shoulder External Rotation with Resistance  - 1 x daily - 7 x weekly - 2 sets - 10 reps - Shoulder extension with resistance - Neutral  - 1 x daily - 7 x weekly - 2 sets - 10 reps  03/01/23- Shoulder Alphabet with Ball at Wall  - 1 x daily - 7 x weekly - 2-3 reps   ASSESSMENT:   CLINICAL IMPRESSION: Patient doing well and progresses to higher incline with AAROM and is able to achieve 151 flexion. Doing well with light resistance bands. Patient will continue to benefit from physical therapy in order to improve function and reduce impairment.     OBJECTIVE IMPAIRMENTS: decreased activity tolerance, decreased endurance, decreased ROM, decreased strength, hypomobility, impaired flexibility, impaired UE functional use, and pain.    ACTIVITY LIMITATIONS: carrying, lifting, bathing, dressing, reach over head, and hygiene/grooming   PARTICIPATION LIMITATIONS: meal prep, cleaning, laundry, driving, shopping, and community activity    PERSONAL FACTORS: 3+ comorbidities: cervical spondylosis, osteoporosis, R shoulder pain  are also affecting patient's functional outcome.    REHAB POTENTIAL: Good   CLINICAL DECISION MAKING: Stable/uncomplicated   EVALUATION COMPLEXITY: Low     GOALS:   SHORT TERM GOALS:    Pt will be independent and compliant with HEP for improved pain, ROM, strength, and function  Baseline: Goal status: GOAL MET  Target date:  01/24/2023     2.  Pt will demo improved PROM to 110 deg in flexion and 30 deg in ER for improved shoulder mobility and tightness Baseline:  Goal status: GOAL MET  11/19 Target date:  01/17/2023       3.  Pt will demo L shoulder AAROM to 130 deg in flexion (supine) and 30 deg in ER for improved stiffness and mobility Baseline:  Goal status: GOAL MET  11/19 Target date:  01/31/2023     4.  Pt will be able to actively perform R shoulder flexion AROM to at least 90 deg in supine.  Baseline:  Goal status: GOAL MET  11/19 Target date:  01/24/2023     5.  Pt will be able to actively elevate R shoulder > 90 deg without significant shoulder hike.  Baseline:  Goal status: ONGOING Target date:  02/28/2023     6.  Pt will be able to perform her self care activities with no > than minimal difficulty.   Baseline:  Goal status: PROGRESSING  11/19 Target date:  03/21/2023     LONG TERM GOALS: Target date:   04/18/2023     Pt will be able to perform her ADLs and IADLs without significant difficulty and pain.  Baseline:  Goal status: INITIAL   2.   Pt will demo L shoulder AROM to be [facility] t/o for performance of ADLs and IADLs  Baseline:  Goal status: INITIAL   3.  Pt will be able to perform her normal reaching and functional overhead activities without significant limitation and pain. Baseline:  Goal status: INITIAL   4.  Pt will be able to reach into an overhead cabinet without significant difficulty.  Baseline:  Goal status: INITIAL Target date:  04/04/2023      5.  Pt will progress with strengthening exercises per protocol without adverse effects in order to perform her normal functional lifting/carrying activities and household chores.  Baseline:  Goal status: INITIAL       PLAN:   PT FREQUENCY: 2x/week   PT  DURATION: other: 9 weeks   PLANNED INTERVENTIONS: Therapeutic exercises, Therapeutic activity, Neuromuscular re-education, Patient/Family education, Self Care, Aquatic Therapy, Dry Needling, Electrical stimulation, Cryotherapy, Moist heat, Taping, Manual therapy, and Re-evaluation   PLAN FOR NEXT SESSION: Cont per Dr. Suzan Nailer RSA protocol.        Reola Mosher Chakita Mcgraw, PT 03/03/2023, 3:19 PM

## 2023-03-08 ENCOUNTER — Ambulatory Visit (HOSPITAL_BASED_OUTPATIENT_CLINIC_OR_DEPARTMENT_OTHER): Payer: Medicare HMO | Admitting: Physical Therapy

## 2023-03-08 ENCOUNTER — Encounter (HOSPITAL_BASED_OUTPATIENT_CLINIC_OR_DEPARTMENT_OTHER): Payer: Self-pay | Admitting: Physical Therapy

## 2023-03-08 DIAGNOSIS — M25612 Stiffness of left shoulder, not elsewhere classified: Secondary | ICD-10-CM | POA: Diagnosis not present

## 2023-03-08 DIAGNOSIS — M6281 Muscle weakness (generalized): Secondary | ICD-10-CM

## 2023-03-08 DIAGNOSIS — M25512 Pain in left shoulder: Secondary | ICD-10-CM

## 2023-03-08 NOTE — Therapy (Signed)
OUTPATIENT PHYSICAL THERAPY TREATMENT       Patient Name: Heidi Shah MRN: [id number] DOB:Oct 14, 1945, 77 y.o., female Today's Date: 03/08/2023  END OF SESSION:  PT End of Session - 03/08/23 1021     Visit Number 15    Number of Visits 28    Date for PT Re-Evaluation 04/18/23    Authorization Type [employer] MCR    PT Start Time 1020    PT Stop Time 1100    PT Time Calculation (min) 40 min    Activity Tolerance Patient tolerated treatment well    Behavior During Therapy WFL for tasks assessed/performed               Past Medical History:  Diagnosis Date   Allergy    Anxiety    Arthritis    Borderline glaucoma    Cataract    surgery - Bilateral cataracts removed   Cervical spondylolysis    Chicken pox    Colon polyps    Depression    Diverticulitis    Diverticulosis    Esophageal spasm    Esophageal stricture    Family history of adverse reaction to anesthesia    difficult time waking mother up   GERD (gastroesophageal reflux disease)    Heart murmur    Hypertension    Osteoporosis    Traumatic complete tear of left rotator cuff    Past Surgical History:  Procedure Laterality Date   cataract surgery Bilateral 1996   COLONOSCOPY  2022   EYE SURGERY  2000   detached L retina   HERNIA REPAIR Bilateral 1990   inguinal   NECK SURGERY     age 67-trimmed a muscle due to not being able to hold her head up   POLYPECTOMY  2022   REVERSE SHOULDER ARTHROPLASTY Left 12/16/2022   Procedure: LEFT REVERSE SHOULDER ARTHROPLASTY;  Surgeon: Huel Cote, MD;  Location: [facility];  Service: Orthopedics;  Laterality: Left;   SHOULDER ARTHROSCOPY WITH ROTATOR CUFF REPAIR AND OPEN BICEPS TENODESIS Left 12/10/2021   Procedure: LEFT SHOULDER ARTHROSCOPY WITH ROTATOR CUFF REPAIR AND OPEN BICEPS TENODESIS;  Surgeon: Huel Cote, MD;  Location: [facility];  Service: Orthopedics;  Laterality: Left;   UPPER GASTROINTESTINAL ENDOSCOPY  2022   Patient Active Problem List    Diagnosis Date Noted   Traumatic complete tear of left rotator cuff    Cervical spondylosis with myelopathy and radiculopathy 03/14/2020   Neck pain 03/14/2020   Aortic heart murmur 02/25/2020   Age-related osteoporosis without current pathological fracture 04/04/2019   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   Constipation 03/05/2013   GERD (gastroesophageal reflux disease) 01/19/2013   Esophageal spasm 01/19/2013   Dysphagia 01/19/2013   Diverticulitis of colon (without mention of hemorrhage)(562.11) 11/23/2012   History of depression 11/23/2012   Essential hypertension, benign 11/23/2012     THERAPY DIAG:  Left shoulder pain, unspecified chronicity  Stiffness of left shoulder, not elsewhere classified  Muscle weakness (generalized)  REFERRING PROVIDER: Huel Cote, MD   REFERRING DIAG: S46.012A (ICD-10-CM) - Traumatic complete tear of left rotator cuff, initial encounter    S/p L RSA   THERAPY DIAG:  Left shoulder pain, unspecified chronicity   Stiffness of left shoulder, not elsewhere classified   Muscle weakness (generalized)   Rationale for Evaluation and Treatment: Rehabilitation   ONSET DATE: DOS 12/16/2022   SUBJECTIVE:  SUBJECTIVE STATEMENT: Pt is 11 weeks post of L R TSA. Was sore after last session but got better after a day. Was able to wash her hair by herself.       Hand dominance: Right   PERTINENT HISTORY: -L shoulder reverse shoulder arthroplasty (RSA) and biceps tenodesis on 12/16/2022.  -L shoulder RCR and biceps tenodesis in 11/2021. -Arthritis, cervical spondylosis, osteoporosis, HTN -Pt states she does have 2 small tears in her R shoulder and does some pain in R shoulder.     PAIN:  NPRS:  0/10 current, 3-4/10 worst, 0/10 best Pt denies soreness.    PRECAUTIONS:  Other: Tears and pain in R shoulder, osteoporosis       WEIGHT BEARING RESTRICTIONS: Yes L UE   FALLS:  Has patient fallen in last 6 months? No   LIVING ENVIRONMENT: Lives with: lives alone Lives in: 1 story home Stairs: has steps to enter home and bilat rails on one entrance     OCCUPATION: Pt is retired.   PLOF: Independent   PATIENT GOALS:  to regain 100% movement in arm and use it as normal     OBJECTIVE:    DIAGNOSTIC FINDINGS:  Pt is post op.      DIAGNOSTIC FINDINGS:  Pt is post op.     UPPER EXTREMITY ROM:     ROM Right eval Left eval Left 01/01/23 Left 01/06/23 01/13/23 01/20/23 01/27/23 02/02/23 Left 02/12/23 02/15/23 Left 02/18/23 03/01/23 03/03/23 03/08/23  Shoulder flexion 164 AROM PROM: 85 AAROM 92 AAROM 106  117 at EOS 120 127 at end of session 117 12 at end of session PROM: 130 AROM in supine:  125   Supine 141 (Supine) PROM:  143 AAROM: 140 AROM:  137 145 147 151 AAROM 151  Shoulder scaption 158 AROM                         Shoulder abduction                           Shoulder adduction                           Shoulder internal rotation                           Shoulder external rotation   PROM: 21 25         AAROM: 41 deg  45 PROM:  58 deg AAROM:  52 deg   47  57 supine  Elbow flexion                           Elbow extension                           Wrist flexion                           Wrist extension                           Wrist ulnar deviation                           Wrist radial deviation  Wrist pronation                           Wrist supination                           (Blank rows = not tested)   UPPER EXTREMITY MMT:  MMT Right 03/08/2023 Left 03/08/2023  Shoulder flexion 5 3-  Shoulder extension    Shoulder abduction 5 3-  Shoulder adduction    Shoulder internal rotation 5 4+  Shoulder external rotation 5 3+  Middle trapezius    Lower trapezius    Elbow flexion 5 4+  Elbow  extension 5 4+  Wrist flexion    Wrist extension    Wrist ulnar deviation    Wrist radial deviation    Wrist pronation    Wrist supination    Grip strength (lbs)    (Blank rows = not tested) *= pain/symptoms              TODAY'S TREATMENT:                                                                                                                                         03/08/23 Pulley flexion/scaption 3 minutes PROM flexion, manual: flexion with inferior glides grade II-III Supine shoulder flexion supine 2 x 10, 45 degree incline 2# bar 3 x 10 Shoulder flexion AROM - cabinet reaches 2 x 10  Shoulder flexion to first shelf in cabinet 1# 2 x 10 Sidelying shoulder ER 3 x 10 Supine shoulder bilateral ER YTB 2 x 10 Supine shoulder horizontal abduction RTB 2 x 10  03/03/23 Standing Row RTB 3 x 10  Standing shoulder extension RTB 3 x 10  Pulley flexion/scaption 3 minutes Supine shoulder flexion on 35 degree incline 2 x 10, 45 degree incline 2 x 10, 45 degree incline 1# bar 1 x 10 Sidelying ER with manual assist 3 x 10 Supine shoulder horizontal abduction YTB 3 x 10 Standing functional IR with stick behind back 1 x 10  Standing lateral raise with elbow bent 2 x 10  03/01/23 Pulley flexion/scaption 3 minutes PROM ER, AAROM ER with cane Prone shoulder extension 1# 2 x 10 Prone row 1# 2 x 10  Standing Row RTB 2 x 10  Standing shoulder extension RTB 2 x 10  Prone horizontal abduction  small range 1 x 10 discontinued due to mechanics Supine shoulder horizontal abduction YTB 3 x 15 Standing shoulder ABC with ball at wall 2x  02/22/23 Pt received L shoulder PROM in flex, abd, and ER per pt and tissue tolerance w/n protocol ranges Pulleys in flex and scaption x20 each S/L ER with manual assist 3x10 Prone row 2x10 Prone extension to neutral 2x10 Seated on incline bench:  Shoulder flexion AAROM with wand x 5 reps at  setting 5, x10 reps at setting 4  Shoulder flexion AROM 2x10 at  setting 4 Standing wall walks in flexion x 5 reps   02/18/23 Manual: PROM flexion, abduction, ER, Supine band ER YTB 2 x 10 Sidelying ER with manual assist 3 x 10 Prone shoulder extension 2 x 10  Prone row 2 x 10 Prone horizontal abduction  small range 1 x 10 discontinued due to mechanics Standing shoulder flexion with YTB band in hands 2 x 10 limited range Standing row YTB 3 x 10 Standing shoulder extension YTB 2 x 10  Supine shoulder flexion 2# wand 2 x 10   02/15/23 Pt received L shoulder PROM in flexion, abd, and ER per pt and tissue tolerance w/n protocol ranges Supine wand ER x 10 reps S/L ER with manual assist x 10 reps Standing wall walks in flexion x 7 reps Scap retraction with YTB 2 x 10 reps Supine wand flexion with head elevated on pillows x 10 reps Supine active flexion with head elevated on pillows x 10 reps   FOTO: Initial/Current:  41 / 55 with a goal of 57.    PATIENT EDUCATION: Education details: surgical and protocol limitations and restrictions, objective findings, Dx, relevant anatomy, POC, goal progress, HEP, and exercise form. Person educated: Patient Education method: Explanation, Demonstration, Tactile cues, Verbal cues Education comprehension: verbalized understanding, returned demonstration, verbal cues required, tactile cues required, and needs further education   HOME EXERCISE PROGRAM: Access Code: [email or web address] URL: [email or web address] Date: 02/12/2023 Prepared by: Greig Castilla Keylie Beavers  Exercises - Supine Shoulder Flexion Extension AAROM with Dowel  - 3 x daily - 7 x weekly - 2 sets - 10 reps - Supine Shoulder External Rotation with Dowel  - 3 x daily - 7 x weekly - 2 sets - 10 reps - Standing 'L' Stretch at Counter  - 3 x daily - 7 x weekly - 2 sets - 10 reps - 5-10 second hold - Supine Single Arm Scapular Protraction  - 1 x daily - 7 x weekly - 3 sets - 10 reps - Sidelying Shoulder Abduction Palm Forward (Mirrored)  - 1 x daily - 7  x weekly - 3 sets - 10 reps - Isometric Shoulder Flexion at Wall (Mirrored)  - 2 x daily - 7 x weekly - 5 reps - 10 second hold - Isometric Shoulder Abduction at Wall  - 5 x daily - 7 x weekly - 5 reps - 10 second hold - Isometric Shoulder Extension at Wall  - 2 x daily - 7 x weekly - 5 reps - 10 second hold - Standing Isometric Shoulder Internal Rotation at Doorway  - 2 x daily - 7 x weekly - 5 reps - 10 second hold - Isometric Shoulder External Rotation at Wall  - 2 x daily - 7 x weekly - 5 reps - 10 second hold - Supine Single Arm Shoulder Protraction  - 1 x daily - 7 x weekly - 3 sets - 10 reps - Prone Shoulder Extension - Single Arm (Mirrored)  - 1 x daily - 7 x weekly - 3 sets - 10 reps - Shoulder Flexion Wall Slide with Towel  - 1 x daily - 7 x weekly - 10 reps - Supine Shoulder Flexion AROM  - 2 x daily - 7 x weekly - 2 sets - 10 reps - Standing Shoulder Flexion Wall Walk  - 1-2 x daily - 7 x weekly - 1 sets - 5 reps - Standing Shoulder Row with  Anchored Resistance  - 1 x daily - 7 x weekly - 3 sets - 10 reps - Supine Shoulder External Rotation with Resistance  - 1 x daily - 7 x weekly - 2 sets - 10 reps - Shoulder extension with resistance - Neutral  - 1 x daily - 7 x weekly - 2 sets - 10 reps  03/01/23- Shoulder Alphabet with Ball at Wall  - 1 x daily - 7 x weekly - 2-3 reps   ASSESSMENT:   CLINICAL IMPRESSION: Patient doing well and progresses to higher incline with AAROM and is able to achieve 151 flexion. Progressive incline AAROM for improving shoulder strength in improving range overhead. Functional strengthening to cabinet performed today with fatigue during/following. Improving ER strength but overall continues to lack L shoulder strength limiting ADL and reaching overhead.Patient with end range ROM limitations and strength but is progressing well. Patient will continue to benefit from physical therapy in order to improve function and reduce impairment.     OBJECTIVE  IMPAIRMENTS: decreased activity tolerance, decreased endurance, decreased ROM, decreased strength, hypomobility, impaired flexibility, impaired UE functional use, and pain.    ACTIVITY LIMITATIONS: carrying, lifting, bathing, dressing, reach over head, and hygiene/grooming   PARTICIPATION LIMITATIONS: meal prep, cleaning, laundry, driving, shopping, and community activity   PERSONAL FACTORS: 3+ comorbidities: cervical spondylosis, osteoporosis, R shoulder pain  are also affecting patient's functional outcome.    REHAB POTENTIAL: Good   CLINICAL DECISION MAKING: Stable/uncomplicated   EVALUATION COMPLEXITY: Low     GOALS:   SHORT TERM GOALS:    Pt will be independent and compliant with HEP for improved pain, ROM, strength, and function  Baseline: Goal status: GOAL MET  Target date:  01/24/2023     2.  Pt will demo improved PROM to 110 deg in flexion and 30 deg in ER for improved shoulder mobility and tightness Baseline:  Goal status: GOAL MET  11/19 Target date:  01/17/2023       3.  Pt will demo L shoulder AAROM to 130 deg in flexion (supine) and 30 deg in ER for improved stiffness and mobility Baseline:  Goal status: GOAL MET  11/19 Target date:  01/31/2023     4.  Pt will be able to actively perform R shoulder flexion AROM to at least 90 deg in supine.  Baseline:  Goal status: GOAL MET  11/19 Target date:  01/24/2023     5.  Pt will be able to actively elevate R shoulder > 90 deg without significant shoulder hike.  Baseline:  Goal status: ONGOING Target date:  02/28/2023     6.  Pt will be able to perform her self care activities with no > than minimal difficulty.   Baseline:  Goal status: PROGRESSING  11/19 Target date:  03/21/2023     LONG TERM GOALS: Target date:   04/18/2023     Pt will be able to perform her ADLs and IADLs without significant difficulty and pain.  Baseline:  Goal status: INITIAL   2.   Pt will demo L shoulder AROM to be [facility] t/o for  performance of ADLs and IADLs  Baseline:  Goal status: INITIAL   3.  Pt will be able to perform her normal reaching and functional overhead activities without significant limitation and pain. Baseline:  Goal status: INITIAL   4.  Pt will be able to reach into an overhead cabinet without significant difficulty.  Baseline:  Goal status: INITIAL Target date:  04/04/2023  5.  Pt will progress with strengthening exercises per protocol without adverse effects in order to perform her normal functional lifting/carrying activities and household chores.  Baseline:  Goal status: INITIAL       PLAN:   PT FREQUENCY: 2x/week   PT DURATION: other: 9 weeks   PLANNED INTERVENTIONS: Therapeutic exercises, Therapeutic activity, Neuromuscular re-education, Patient/Family education, Self Care, Aquatic Therapy, Dry Needling, Electrical stimulation, Cryotherapy, Moist heat, Taping, Manual therapy, and Re-evaluation   PLAN FOR NEXT SESSION: Cont per Dr. Suzan Nailer RSA protocol.        Reola Mosher Xariah Silvernail, PT 03/08/2023, 10:21 AM

## 2023-03-10 ENCOUNTER — Ambulatory Visit (HOSPITAL_BASED_OUTPATIENT_CLINIC_OR_DEPARTMENT_OTHER): Payer: Medicare HMO

## 2023-03-10 ENCOUNTER — Ambulatory Visit (HOSPITAL_BASED_OUTPATIENT_CLINIC_OR_DEPARTMENT_OTHER): Payer: Medicare HMO | Admitting: Orthopaedic Surgery

## 2023-03-10 ENCOUNTER — Encounter (HOSPITAL_BASED_OUTPATIENT_CLINIC_OR_DEPARTMENT_OTHER): Payer: Self-pay | Admitting: Physical Therapy

## 2023-03-10 ENCOUNTER — Ambulatory Visit (HOSPITAL_BASED_OUTPATIENT_CLINIC_OR_DEPARTMENT_OTHER): Payer: Medicare HMO | Admitting: Physical Therapy

## 2023-03-10 DIAGNOSIS — M25612 Stiffness of left shoulder, not elsewhere classified: Secondary | ICD-10-CM

## 2023-03-10 DIAGNOSIS — Z96612 Presence of left artificial shoulder joint: Secondary | ICD-10-CM

## 2023-03-10 DIAGNOSIS — M6281 Muscle weakness (generalized): Secondary | ICD-10-CM | POA: Diagnosis not present

## 2023-03-10 DIAGNOSIS — M25512 Pain in left shoulder: Secondary | ICD-10-CM | POA: Diagnosis not present

## 2023-03-10 NOTE — Therapy (Signed)
OUTPATIENT PHYSICAL THERAPY TREATMENT       Patient Name: Heidi Shah MRN: [id number] DOB:[birth year], 77 y.o., female Today's Date: 03/10/2023  END OF SESSION:  PT End of Session - 03/10/23 1027     Visit Number 16    Number of Visits 28    Date for PT Re-Evaluation 04/18/23    Authorization Type humana MCR    PT Start Time 1027   MD appt   PT Stop Time 1100    PT Time Calculation (min) 33 min    Activity Tolerance Patient tolerated treatment well    Behavior During Therapy WFL for tasks assessed/performed               Past Medical History:  Diagnosis Date   Allergy    Anxiety    Arthritis    Borderline glaucoma    Cataract    surgery - Bilateral cataracts removed   Cervical spondylolysis    Chicken pox    Colon polyps    Depression    Diverticulitis    Diverticulosis    Esophageal spasm    Esophageal stricture    Family history of adverse reaction to anesthesia    difficult time waking mother up   GERD (gastroesophageal reflux disease)    Heart murmur    Hypertension    Osteoporosis    Traumatic complete tear of left rotator cuff    Past Surgical History:  Procedure Laterality Date   cataract surgery Bilateral 1996   COLONOSCOPY  2022   EYE SURGERY  2000   detached L retina   HERNIA REPAIR Bilateral 1990   inguinal   NECK SURGERY     age 89-trimmed a muscle due to not being able to hold her head up   POLYPECTOMY  2022   REVERSE SHOULDER ARTHROPLASTY Left 12/16/2022   Procedure: LEFT REVERSE SHOULDER ARTHROPLASTY;  Surgeon: Huel Cote, MD;  Location: [facility] ORS;  Service: Orthopedics;  Laterality: Left;   SHOULDER ARTHROSCOPY WITH ROTATOR CUFF REPAIR AND OPEN BICEPS TENODESIS Left 12/10/2021   Procedure: LEFT SHOULDER ARTHROSCOPY WITH ROTATOR CUFF REPAIR AND OPEN BICEPS TENODESIS;  Surgeon: Huel Cote, MD;  Location: [facility];  Service: Orthopedics;  Laterality: Left;   UPPER GASTROINTESTINAL ENDOSCOPY  2022   Patient Active Problem  List   Diagnosis Date Noted   Traumatic complete tear of left rotator cuff    Cervical spondylosis with myelopathy and radiculopathy 03/14/2020   Neck pain 03/14/2020   Aortic heart murmur 02/25/2020   Age-related osteoporosis without current pathological fracture 04/04/2019   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   Constipation 03/05/2013   GERD (gastroesophageal reflux disease) 01/19/2013   Esophageal spasm 01/19/2013   Dysphagia 01/19/2013   Diverticulitis of colon (without mention of hemorrhage)(562.11) 11/23/2012   History of depression 11/23/2012   Essential hypertension, benign 11/23/2012     THERAPY DIAG:  Left shoulder pain, unspecified chronicity  Stiffness of left shoulder, not elsewhere classified  Muscle weakness (generalized)  REFERRING PROVIDER: Huel Cote, MD   REFERRING DIAG: S46.012A (ICD-10-CM) - Traumatic complete tear of left rotator cuff, initial encounter    S/p L RSA   THERAPY DIAG:  Left shoulder pain, unspecified chronicity   Stiffness of left shoulder, not elsewhere classified   Muscle weakness (generalized)   Rationale for Evaluation and Treatment: Rehabilitation   ONSET DATE: DOS 12/16/2022   SUBJECTIVE:  SUBJECTIVE STATEMENT: Pt is 12 weeks post of L R TSA. Patient states MD was quite pleased with her progress. HEP going well. Practiced reaching into cabinet.       Hand dominance: Right   PERTINENT HISTORY: -L shoulder reverse shoulder arthroplasty (RSA) and biceps tenodesis on 12/16/2022.  -L shoulder RCR and biceps tenodesis in 11/2021. -Arthritis, cervical spondylosis, osteoporosis, HTN -Pt states she does have 2 small tears in her R shoulder and does some pain in R shoulder.     PAIN:  NPRS:  0/10 current, 3-4/10 worst, 0/10 best Pt denies soreness.     PRECAUTIONS: Other: Tears and pain in R shoulder, osteoporosis       WEIGHT BEARING RESTRICTIONS: Yes L UE   FALLS:  Has patient fallen in last 6 months? No   LIVING ENVIRONMENT: Lives with: lives alone Lives in: 1 story home Stairs: has steps to enter home and bilat rails on one entrance     OCCUPATION: Pt is retired.   PLOF: Independent   PATIENT GOALS:  to regain 100% movement in arm and use it as normal     OBJECTIVE:    DIAGNOSTIC FINDINGS:  Pt is post op.      DIAGNOSTIC FINDINGS:  Pt is post op.     UPPER EXTREMITY ROM:     ROM Right eval Left eval Left 01/01/23 Left 01/06/23 01/13/23 01/20/23 01/27/23 02/02/23 Left 02/12/23 02/15/23 Left 02/18/23 03/01/23 03/03/23 03/08/23 03/10/23  Shoulder flexion 164 AROM PROM: 85 AAROM 92 AAROM 106  117 at EOS 120 127 at end of session 117 12 at end of session PROM: 130 AROM in supine:  125   Supine 141 (Supine) PROM:  143 AAROM: 140 AROM:  137 145 147 151 AAROM 151 AAROM 151  Shoulder scaption 158 AROM                          Shoulder abduction                            Shoulder adduction                            Shoulder internal rotation                            Shoulder external rotation   PROM: 21 25         AAROM: 41 deg  45 PROM:  58 deg AAROM:  52 deg   47  57 supine 56 si[ome  Elbow flexion                            Elbow extension                            Wrist flexion                            Wrist extension                            Wrist ulnar deviation  Wrist radial deviation                            Wrist pronation                            Wrist supination                            (Blank rows = not tested)   UPPER EXTREMITY MMT:  MMT Right 03/08/2023 Left 03/08/2023  Shoulder flexion 5 3-  Shoulder extension    Shoulder abduction 5 3-  Shoulder adduction    Shoulder internal rotation 5 4+  Shoulder external rotation 5 3+  Middle  trapezius    Lower trapezius    Elbow flexion 5 4+  Elbow extension 5 4+  Wrist flexion    Wrist extension    Wrist ulnar deviation    Wrist radial deviation    Wrist pronation    Wrist supination    Grip strength (lbs)    (Blank rows = not tested) *= pain/symptoms              TODAY'S TREATMENT:                                                                                                                                         03/10/23 Pulley flexion/scaption 3 minutes Supine shoulder flexion supine 3# 2 x 10 Supine shoulder horizontal abduction RTB 2 x 10 Supine shoulder bilateral ER RTB 2 x 10 Shoulder PNF D2 YTB 1x 10,  RTB 1 x 10  Standing Row GTB 1x15 Standing shoulder extension GTB 1 x 15 Standing shoulder flexion 1# 1 x 10   03/08/23 Pulley flexion/scaption 3 minutes PROM flexion, manual: flexion with inferior glides grade II-III Supine shoulder flexion supine 2 x 10, 45 degree incline 2# bar 3 x 10 Shoulder flexion AROM - cabinet reaches 2 x 10  Shoulder flexion to first shelf in cabinet 1# 2 x 10 Sidelying shoulder ER 3 x 10 Supine shoulder bilateral ER YTB 2 x 10 Supine shoulder horizontal abduction RTB 2 x 10  03/03/23 Standing Row RTB 3 x 10  Standing shoulder extension RTB 3 x 10  Pulley flexion/scaption 3 minutes Supine shoulder flexion on 35 degree incline 2 x 10, 45 degree incline 2 x 10, 45 degree incline 1# bar 1 x 10 Sidelying ER with manual assist 3 x 10 Supine shoulder horizontal abduction YTB 3 x 10 Standing functional IR with stick behind back 1 x 10  Standing lateral raise with elbow bent 2 x 10  03/01/23 Pulley flexion/scaption 3 minutes PROM ER, AAROM ER with cane Prone shoulder extension 1# 2 x 10 Prone row 1# 2 x 10  Standing Row RTB 2 x 10  Standing shoulder extension RTB 2 x 10  Prone horizontal abduction  small range 1 x 10 discontinued due to mechanics Supine shoulder horizontal abduction YTB 3 x 15 Standing shoulder ABC with ball  at wall 2x  02/22/23 Pt received L shoulder PROM in flex, abd, and ER per pt and tissue tolerance w/n protocol ranges Pulleys in flex and scaption x20 each S/L ER with manual assist 3x10 Prone row 2x10 Prone extension to neutral 2x10 Seated on incline bench:  Shoulder flexion AAROM with wand x 5 reps at setting 5, x10 reps at setting 4  Shoulder flexion AROM 2x10 at setting 4 Standing wall walks in flexion x 5 reps   02/18/23 Manual: PROM flexion, abduction, ER, Supine band ER YTB 2 x 10 Sidelying ER with manual assist 3 x 10 Prone shoulder extension 2 x 10  Prone row 2 x 10 Prone horizontal abduction  small range 1 x 10 discontinued due to mechanics Standing shoulder flexion with YTB band in hands 2 x 10 limited range Standing row YTB 3 x 10 Standing shoulder extension YTB 2 x 10  Supine shoulder flexion 2# wand 2 x 10   02/15/23 Pt received L shoulder PROM in flexion, abd, and ER per pt and tissue tolerance w/n protocol ranges Supine wand ER x 10 reps S/L ER with manual assist x 10 reps Standing wall walks in flexion x 7 reps Scap retraction with YTB 2 x 10 reps Supine wand flexion with head elevated on pillows x 10 reps Supine active flexion with head elevated on pillows x 10 reps   FOTO: Initial/Current:  41 / 55 with a goal of 57.    PATIENT EDUCATION: Education details: surgical and protocol limitations and restrictions, objective findings, Dx, relevant anatomy, POC, goal progress, HEP, and exercise form. Person educated: Patient Education method: Explanation, Demonstration, Tactile cues, Verbal cues Education comprehension: verbalized understanding, returned demonstration, verbal cues required, tactile cues required, and needs further education   HOME EXERCISE PROGRAM: Access Code: [email or web address] URL: [email or web address] Date: 02/12/2023 Prepared by: Greig Castilla Micaiah Litle  Exercises - Supine Shoulder Flexion Extension AAROM with Dowel  - 3 x daily - 7  x weekly - 2 sets - 10 reps - Supine Shoulder External Rotation with Dowel  - 3 x daily - 7 x weekly - 2 sets - 10 reps - Standing 'L' Stretch at Counter  - 3 x daily - 7 x weekly - 2 sets - 10 reps - 5-10 second hold - Supine Single Arm Scapular Protraction  - 1 x daily - 7 x weekly - 3 sets - 10 reps - Sidelying Shoulder Abduction Palm Forward (Mirrored)  - 1 x daily - 7 x weekly - 3 sets - 10 reps - Isometric Shoulder Flexion at Wall (Mirrored)  - 2 x daily - 7 x weekly - 5 reps - 10 second hold - Isometric Shoulder Abduction at Wall  - 5 x daily - 7 x weekly - 5 reps - 10 second hold - Isometric Shoulder Extension at Wall  - 2 x daily - 7 x weekly - 5 reps - 10 second hold - Standing Isometric Shoulder Internal Rotation at Doorway  - 2 x daily - 7 x weekly - 5 reps - 10 second hold - Isometric Shoulder External Rotation at Wall  - 2 x daily - 7 x weekly - 5 reps - 10 second hold - Supine Single Arm Shoulder Protraction  - 1 x daily - 7 x weekly - 3  sets - 10 reps - Prone Shoulder Extension - Single Arm (Mirrored)  - 1 x daily - 7 x weekly - 3 sets - 10 reps - Shoulder Flexion Wall Slide with Towel  - 1 x daily - 7 x weekly - 10 reps - Supine Shoulder Flexion AROM  - 2 x daily - 7 x weekly - 2 sets - 10 reps - Standing Shoulder Flexion Wall Walk  - 1-2 x daily - 7 x weekly - 1 sets - 5 reps - Standing Shoulder Row with Anchored Resistance  - 1 x daily - 7 x weekly - 3 sets - 10 reps - Supine Shoulder External Rotation with Resistance  - 1 x daily - 7 x weekly - 2 sets - 10 reps - Shoulder extension with resistance - Neutral  - 1 x daily - 7 x weekly - 2 sets - 10 reps  03/01/23- Shoulder Alphabet with Ball at Wall  - 1 x daily - 7 x weekly - 2-3 reps   ASSESSMENT:   CLINICAL IMPRESSION: AAROM at 151. Patient demonstrating improving functional mobility and shoulder strength. Able to progress with additional resistance and into more resistance exercises. Updated HEP. Patient will continue to  benefit from physical therapy in order to improve function and reduce impairment.     OBJECTIVE IMPAIRMENTS: decreased activity tolerance, decreased endurance, decreased ROM, decreased strength, hypomobility, impaired flexibility, impaired UE functional use, and pain.    ACTIVITY LIMITATIONS: carrying, lifting, bathing, dressing, reach over head, and hygiene/grooming   PARTICIPATION LIMITATIONS: meal prep, cleaning, laundry, driving, shopping, and community activity   PERSONAL FACTORS: 3+ comorbidities: cervical spondylosis, osteoporosis, R shoulder pain  are also affecting patient's functional outcome.    REHAB POTENTIAL: Good   CLINICAL DECISION MAKING: Stable/uncomplicated   EVALUATION COMPLEXITY: Low     GOALS:   SHORT TERM GOALS:    Pt will be independent and compliant with HEP for improved pain, ROM, strength, and function  Baseline: Goal status: GOAL MET  Target date:  01/24/2023     2.  Pt will demo improved PROM to 110 deg in flexion and 30 deg in ER for improved shoulder mobility and tightness Baseline:  Goal status: GOAL MET  11/19 Target date:  01/17/2023       3.  Pt will demo L shoulder AAROM to 130 deg in flexion (supine) and 30 deg in ER for improved stiffness and mobility Baseline:  Goal status: GOAL MET  11/19 Target date:  01/31/2023     4.  Pt will be able to actively perform R shoulder flexion AROM to at least 90 deg in supine.  Baseline:  Goal status: GOAL MET  11/19 Target date:  01/24/2023     5.  Pt will be able to actively elevate R shoulder > 90 deg without significant shoulder hike.  Baseline:  Goal status: ONGOING Target date:  02/28/2023     6.  Pt will be able to perform her self care activities with no > than minimal difficulty.   Baseline:  Goal status: PROGRESSING  11/19 Target date:  03/21/2023     LONG TERM GOALS: Target date:   04/18/2023     Pt will be able to perform her ADLs and IADLs without significant difficulty  and pain.  Baseline:  Goal status: INITIAL   2.   Pt will demo L shoulder AROM to be [facility] t/o for performance of ADLs and IADLs  Baseline:  Goal status: INITIAL   3.  Pt will be able to perform her normal reaching and functional overhead activities without significant limitation and pain. Baseline:  Goal status: INITIAL   4.  Pt will be able to reach into an overhead cabinet without significant difficulty.  Baseline:  Goal status: INITIAL Target date:  04/04/2023     5.  Pt will progress with strengthening exercises per protocol without adverse effects in order to perform her normal functional lifting/carrying activities and household chores.  Baseline:  Goal status: INITIAL       PLAN:   PT FREQUENCY: 2x/week   PT DURATION: other: 9 weeks   PLANNED INTERVENTIONS: Therapeutic exercises, Therapeutic activity, Neuromuscular re-education, Patient/Family education, Self Care, Aquatic Therapy, Dry Needling, Electrical stimulation, Cryotherapy, Moist heat, Taping, Manual therapy, and Re-evaluation   PLAN FOR NEXT SESSION: Cont per Dr. Suzan Nailer RSA protocol.        Reola Mosher Tifini Reeder, PT 03/10/2023, 10:59 AM

## 2023-03-10 NOTE — Progress Notes (Signed)
Post Operative Evaluation    Procedure/Date of Surgery: Left shoulder reverse shoulder arthroplasty 9/19  Interval History:   12 weeks status post the above procedure.  Overall she is doing very well.  Her range of motion is continuing to improve.  She has even been working on behind the back motion.  Overall she has little to no pain   PMH/PSH/Family History/Social History/Meds/Allergies:    Past Medical History:  Diagnosis Date   Allergy    Anxiety    Arthritis    Borderline glaucoma    Cataract    surgery - Bilateral cataracts removed   Cervical spondylolysis    Chicken pox    Colon polyps    Depression    Diverticulitis    Diverticulosis    Esophageal spasm    Esophageal stricture    Family history of adverse reaction to anesthesia    difficult time waking mother up   GERD (gastroesophageal reflux disease)    Heart murmur    Hypertension    Osteoporosis    Traumatic complete tear of left rotator cuff    Past Surgical History:  Procedure Laterality Date   cataract surgery Bilateral 1996   COLONOSCOPY  2022   EYE SURGERY  2000   detached L retina   HERNIA REPAIR Bilateral 1990   inguinal   NECK SURGERY     age 48-trimmed a muscle due to not being able to hold her head up   POLYPECTOMY  2022   REVERSE SHOULDER ARTHROPLASTY Left 12/16/2022   Procedure: LEFT REVERSE SHOULDER ARTHROPLASTY;  Surgeon: Huel Cote, MD;  Location: [facility] ORS;  Service: Orthopedics;  Laterality: Left;   SHOULDER ARTHROSCOPY WITH ROTATOR CUFF REPAIR AND OPEN BICEPS TENODESIS Left 12/10/2021   Procedure: LEFT SHOULDER ARTHROSCOPY WITH ROTATOR CUFF REPAIR AND OPEN BICEPS TENODESIS;  Surgeon: Huel Cote, MD;  Location: [facility] OR;  Service: Orthopedics;  Laterality: Left;   UPPER GASTROINTESTINAL ENDOSCOPY  2022   Social History   Socioeconomic History   Marital status: Divorced    Spouse name: Not on file   Number of children: 2   Years of  education: master's degree   Highest education level: Master's degree (e.g., MA, MS, MEng, MEd, MSW, MBA)  Occupational History   Occupation: retired Runner, broadcasting/film/video  Tobacco Use   Smoking status: Never   Smokeless tobacco: Never  Vaping Use   Vaping status: Never Used  Substance and Sexual Activity   Alcohol use: Yes    Alcohol/week: 1.0 standard drink of alcohol    Types: 1 Glasses of wine per week    Comment: one or two glasses of wine a couple times a week   Drug use: No   Sexual activity: Not on file  Other Topics Concern   Not on file  Social History Narrative      Lives alone in ranch home.    2 children   Son lives in [place]   Continues to teach virtually part time   Social Drivers of [employer]: Low Risk  (01/18/2023)   Overall Financial Resource Strain (CARDIA)    Difficulty of Paying Living Expenses: Not hard at all  Food Insecurity: No Food Insecurity (01/18/2023)   Hunger Vital Sign    Worried About Running Out of Food in the  Last Year: Never true    Ran Out of Food in the Last Year: Never true  Transportation Needs: No Transportation Needs (01/18/2023)   PRAPARE - Administrator, Civil Service (Medical): No    Lack of Transportation (Non-Medical): No  Physical Activity: Unknown (01/18/2023)   Exercise Vital Sign    Days of Exercise per Week: 1 day    Minutes of Exercise per Session: Patient declined  Recent Concern: Physical Activity - Inactive (12/03/2022)   Exercise Vital Sign    Days of Exercise per Week: 0 days    Minutes of Exercise per Session: 60 min  Stress: No Stress Concern Present (01/18/2023)   [employer] of Occupational Health - Occupational Stress Questionnaire    Feeling of Stress : Not at all  Social Connections: Moderately Integrated (01/18/2023)   Social Connection and Isolation Panel [NHANES]    Frequency of Communication with Friends and Family: Once a week    Frequency of Social Gatherings with  Friends and Family: Twice a week    Attends Religious Services: More than 4 times per year    Active Member of [employer] or Organizations: Yes    Attends Engineer, structural: More than 4 times per year    Marital Status: Divorced   Family History  Problem Relation Age of Onset   Heart disease Mother 33   COPD Mother    Crohn's disease Mother    Heart disease Father 33   Cancer Father        kidney   Colon cancer Neg Hx    Esophageal cancer Neg Hx    Stomach cancer Neg Hx    Rectal cancer Neg Hx    Allergies  Allergen Reactions   Nickel Sulfate [Nickel] Rash    Per skin test from dermatologist   Beeswax     Per skin test from dermatologist   Cetrimonium Chloride [Cetrimide]     Per skin test from dermatologist   Lactose Intolerance (Gi) Diarrhea   Methylisothiazolinone     Per skin test from dermatologist   Neomycin Sulfate [Neomycin]     Per skin test from dermatologist   Penicillins     Childhood Reaction    Propolis     Per skin test from dermatologist   Current Outpatient Medications  Medication Sig Dispense Refill   acetaminophen (TYLENOL) 500 MG tablet Take 500 mg by mouth every 6 (six) hours as needed.     amLODipine (NORVASC) 2.5 MG tablet Take 1 tablet (2.5 mg total) by mouth daily. (Patient taking differently: Take 2.5 mg by mouth every morning.) 90 tablet 3   Biotin w/ Vitamins C & E (HAIR SKIN & NAILS GUMMIES PO) Take 2 each by mouth 4 (four) times a week.     Calcium Carb-Cholecalciferol (CALCIUM 600+D3 PO) Take 1 tablet by mouth daily.     cetirizine (ZYRTEC) 10 MG chewable tablet Chew 10 mg by mouth every morning.     cyanocobalamin (VITAMIN B12) 1000 MCG tablet Take 1,000 mcg by mouth daily.     Ginkgo Biloba 60 MG CAPS Take 60 mg by mouth daily.     ibuprofen (ADVIL) 800 MG tablet Take 800 mg by mouth every 8 (eight) hours as needed.     lisinopril-hydrochlorothiazide (ZESTORETIC) 20-12.5 MG tablet Take 1 tablet by mouth daily. (Patient taking  differently: Take 1 tablet by mouth every morning.) 90 tablet 3   Multiple Vitamin (MULTI-VITAMIN) tablet Take 1 tablet by mouth 4 (  four) times a week.     omeprazole (PRILOSEC) 40 MG capsule Take 1 capsule (40 mg total) by mouth daily. (Patient taking differently: Take 40 mg by mouth every morning.) 90 capsule 3   oxyCODONE (ROXICODONE) 5 MG immediate release tablet Take 1 tablet (5 mg total) by mouth every 4 (four) hours as needed for severe pain or breakthrough pain. 10 tablet 0   vitamin C (ASCORBIC ACID) 500 MG tablet Take 500 mg by mouth 4 (four) times a week.     VYZULTA 0.024 % SOLN Place 1 drop into both eyes at bedtime.     No current facility-administered medications for this visit.   DG Shoulder Left Result Date: 03/10/2023 CLINICAL DATA:  Postop shoulder replacement. EXAM: LEFT SHOULDER - 2+ VIEW COMPARISON:  12/27/2022 FINDINGS: Reverse shoulder arthroplasty in expected in unchanged alignment. No periprosthetic lucency. No fracture. Stable acromioclavicular alignment. Unremarkable soft tissues. IMPRESSION: Reverse shoulder arthroplasty without complication. Electronically Signed   By: Narda Rutherford M.D.   On: 03/10/2023 11:22    Review of Systems:   A ROS was performed including pertinent positives and negatives as documented in the HPI.   Musculoskeletal Exam:    There were no vitals taken for this visit.  Left incision is well-appearing without erythema or drainage.  In the spine position she is able to forward elevate to 135 with external rotation at the side to 45 actively.  Internal rotation to back pocket.  Distal neurosensory exam is intact  Imaging:    2 views left shoulder: Status post reverse shoulder arthroplasty without evidence of complication  I personally reviewed and interpreted the radiographs.   Assessment:   12 week status post left shoulder reverse shoulder arthroplasty without evidence of complication.  Overall she is continuing to improve  dramatically.  I would like to see her back in 3 months for reassessment  Plan :    -She will return to clinic in 12 weeks for reassessment      I personally saw and evaluated the patient, and participated in the management and treatment plan.  Huel Cote, MD Attending Physician, Orthopedic Surgery  This document was dictated using Dragon voice recognition software. A reasonable attempt at proof reading has been made to minimize errors.

## 2023-03-15 ENCOUNTER — Ambulatory Visit (INDEPENDENT_AMBULATORY_CARE_PROVIDER_SITE_OTHER): Payer: Medicare HMO | Admitting: Internal Medicine

## 2023-03-15 ENCOUNTER — Encounter: Payer: Self-pay | Admitting: Internal Medicine

## 2023-03-15 ENCOUNTER — Ambulatory Visit (HOSPITAL_BASED_OUTPATIENT_CLINIC_OR_DEPARTMENT_OTHER): Payer: Medicare HMO | Admitting: Physical Therapy

## 2023-03-15 ENCOUNTER — Encounter (HOSPITAL_BASED_OUTPATIENT_CLINIC_OR_DEPARTMENT_OTHER): Payer: Self-pay | Admitting: Physical Therapy

## 2023-03-15 VITALS — BP 110/80 | HR 82 | Temp 98.1°F | Wt 120.3 lb

## 2023-03-15 DIAGNOSIS — M25512 Pain in left shoulder: Secondary | ICD-10-CM | POA: Diagnosis not present

## 2023-03-15 DIAGNOSIS — I872 Venous insufficiency (chronic) (peripheral): Secondary | ICD-10-CM | POA: Diagnosis not present

## 2023-03-15 DIAGNOSIS — M6281 Muscle weakness (generalized): Secondary | ICD-10-CM | POA: Diagnosis not present

## 2023-03-15 DIAGNOSIS — M25612 Stiffness of left shoulder, not elsewhere classified: Secondary | ICD-10-CM | POA: Diagnosis not present

## 2023-03-15 NOTE — Therapy (Signed)
OUTPATIENT PHYSICAL THERAPY TREATMENT       Patient Name: Heidi Shah MRN: [id number] DOB:1945-12-03, 77 y.o., female Today's Date: 03/15/2023  END OF SESSION:  PT End of Session - 03/15/23 0836     Visit Number 17    Number of Visits 28    Date for PT Re-Evaluation 04/18/23    Authorization Type [employer] MCR    PT Start Time 0840    PT Stop Time 0920    PT Time Calculation (min) 40 min    Activity Tolerance Patient tolerated treatment well    Behavior During Therapy WFL for tasks assessed/performed               Past Medical History:  Diagnosis Date   Allergy    Anxiety    Arthritis    Borderline glaucoma    Cataract    surgery - Bilateral cataracts removed   Cervical spondylolysis    Chicken pox    Colon polyps    Depression    Diverticulitis    Diverticulosis    Esophageal spasm    Esophageal stricture    Family history of adverse reaction to anesthesia    difficult time waking mother up   GERD (gastroesophageal reflux disease)    Heart murmur    Hypertension    Osteoporosis    Traumatic complete tear of left rotator cuff    Past Surgical History:  Procedure Laterality Date   cataract surgery Bilateral 1996   COLONOSCOPY  2022   EYE SURGERY  2000   detached L retina   HERNIA REPAIR Bilateral 1990   inguinal   NECK SURGERY     age 66-trimmed a muscle due to not being able to hold her head up   POLYPECTOMY  2022   REVERSE SHOULDER ARTHROPLASTY Left 12/16/2022   Procedure: LEFT REVERSE SHOULDER ARTHROPLASTY;  Surgeon: Huel Cote, MD;  Location: [facility] ORS;  Service: Orthopedics;  Laterality: Left;   SHOULDER ARTHROSCOPY WITH ROTATOR CUFF REPAIR AND OPEN BICEPS TENODESIS Left 12/10/2021   Procedure: LEFT SHOULDER ARTHROSCOPY WITH ROTATOR CUFF REPAIR AND OPEN BICEPS TENODESIS;  Surgeon: Huel Cote, MD;  Location: [facility];  Service: Orthopedics;  Laterality: Left;   UPPER GASTROINTESTINAL ENDOSCOPY  2022   Patient Active Problem List    Diagnosis Date Noted   Traumatic complete tear of left rotator cuff    Cervical spondylosis with myelopathy and radiculopathy 03/14/2020   Neck pain 03/14/2020   Aortic heart murmur 02/25/2020   Age-related osteoporosis without current pathological fracture 04/04/2019   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   Constipation 03/05/2013   GERD (gastroesophageal reflux disease) 01/19/2013   Esophageal spasm 01/19/2013   Dysphagia 01/19/2013   Diverticulitis of colon (without mention of hemorrhage)(562.11) 11/23/2012   History of depression 11/23/2012   Essential hypertension, benign 11/23/2012     THERAPY DIAG:  Left shoulder pain, unspecified chronicity  Stiffness of left shoulder, not elsewhere classified  Muscle weakness (generalized)  REFERRING PROVIDER: Huel Cote, MD   REFERRING DIAG: S46.012A (ICD-10-CM) - Traumatic complete tear of left rotator cuff, initial encounter    S/p L RSA   THERAPY DIAG:  Left shoulder pain, unspecified chronicity   Stiffness of left shoulder, not elsewhere classified   Muscle weakness (generalized)   Rationale for Evaluation and Treatment: Rehabilitation   ONSET DATE: DOS 12/16/2022   SUBJECTIVE:  SUBJECTIVE STATEMENT: Pt is 12.5 weeks post of L R TSA. Shoulder doing well. HEP going well.       Hand dominance: Right   PERTINENT HISTORY: -L shoulder reverse shoulder arthroplasty (RSA) and biceps tenodesis on 12/16/2022.  -L shoulder RCR and biceps tenodesis in 11/2021. -Arthritis, cervical spondylosis, osteoporosis, HTN -Pt states she does have 2 small tears in her R shoulder and does some pain in R shoulder.     PAIN:  NPRS:  0/10 current, 3-4/10 worst, 0/10 best Pt denies soreness.    PRECAUTIONS: Other: Tears and pain in R shoulder, osteoporosis        WEIGHT BEARING RESTRICTIONS: Yes L UE   FALLS:  Has patient fallen in last 6 months? No   LIVING ENVIRONMENT: Lives with: lives alone Lives in: 1 story home Stairs: has steps to enter home and bilat rails on one entrance     OCCUPATION: Pt is retired.   PLOF: Independent   PATIENT GOALS:  to regain 100% movement in arm and use it as normal     OBJECTIVE:    DIAGNOSTIC FINDINGS:  Pt is post op.      DIAGNOSTIC FINDINGS:  Pt is post op.     UPPER EXTREMITY ROM:     ROM Right eval Left eval Left 01/01/23 Left 01/06/23 01/13/23 01/20/23 01/27/23 02/02/23 Left 02/12/23 02/15/23 Left 02/18/23 03/01/23 03/03/23 03/08/23 03/10/23 03/15/23  Shoulder flexion 164 AROM PROM: 85 AAROM 92 AAROM 106  117 at EOS 120 127 at end of session 117 12 at end of session PROM: 130 AROM in supine:  125   Supine 141 (Supine) PROM:  143 AAROM: 140 AROM:  137 145 147 151 AAROM 151 AAROM 151   Shoulder scaption 158 AROM                           Shoulder abduction                             Shoulder adduction                             Shoulder internal rotation                             Shoulder external rotation   PROM: 21 25         AAROM: 41 deg  45 PROM:  58 deg AAROM:  52 deg   47  57 supine 56 supine   Elbow flexion                             Elbow extension                             Wrist flexion                             Wrist extension                             Wrist ulnar deviation  Wrist radial deviation                             Wrist pronation                             Wrist supination                             (Blank rows = not tested)   UPPER EXTREMITY MMT:  MMT Right 03/08/2023 Left 03/08/2023  Shoulder flexion 5 3-  Shoulder extension    Shoulder abduction 5 3-  Shoulder adduction    Shoulder internal rotation 5 4+  Shoulder external rotation 5 3+  Middle trapezius    Lower trapezius    Elbow flexion 5 4+   Elbow extension 5 4+  Wrist flexion    Wrist extension    Wrist ulnar deviation    Wrist radial deviation    Wrist pronation    Wrist supination    Grip strength (lbs)    (Blank rows = not tested) *= pain/symptoms              TODAY'S TREATMENT:                                                                                                                                         03/15/23 Pulley flexion/scaption 3 minutes Manual: grade II-III inferior glides in progressive flexion Supine shoulder horizontal abduction GTB 2 x 10 Supine shoulder bilateral ER GTB 3 x 10 Shoulder PNF D2  RTB 3 x 10  Standing shoulder flexion with band in hands RTB 3 x 10 Standing shoulder abduction with perpendicular resistance YTB 2 x 10  Standing shoulder flexion with perpendicular resistance YTB 2 x 10  Standing shoulder flexion/press with manual assist 1# 2 x 10 Review of prior prone exercises- adding 2# weight Prone horizontal abduction 2 x 10  03/10/23 Pulley flexion/scaption 3 minutes Supine shoulder flexion supine 3# 2 x 10 Supine shoulder horizontal abduction RTB 2 x 10 Supine shoulder bilateral ER RTB 2 x 10 Shoulder PNF D2 YTB 1x 10,  RTB 1 x 10  Standing Row GTB 1x15 Standing shoulder extension GTB 1 x 15 Standing shoulder flexion 1# 1 x 10   03/08/23 Pulley flexion/scaption 3 minutes PROM flexion, manual: flexion with inferior glides grade II-III Supine shoulder flexion supine 2 x 10, 45 degree incline 2# bar 3 x 10 Shoulder flexion AROM - cabinet reaches 2 x 10  Shoulder flexion to first shelf in cabinet 1# 2 x 10 Sidelying shoulder ER 3 x 10 Supine shoulder bilateral ER YTB 2 x 10 Supine shoulder horizontal abduction RTB 2 x 10  03/03/23 Standing Row RTB 3 x 10  Standing shoulder extension RTB 3  x 10  Pulley flexion/scaption 3 minutes Supine shoulder flexion on 35 degree incline 2 x 10, 45 degree incline 2 x 10, 45 degree incline 1# bar 1 x 10 Sidelying ER with manual  assist 3 x 10 Supine shoulder horizontal abduction YTB 3 x 10 Standing functional IR with stick behind back 1 x 10  Standing lateral raise with elbow bent 2 x 10  03/01/23 Pulley flexion/scaption 3 minutes PROM ER, AAROM ER with cane Prone shoulder extension 1# 2 x 10 Prone row 1# 2 x 10  Standing Row RTB 2 x 10  Standing shoulder extension RTB 2 x 10  Prone horizontal abduction  small range 1 x 10 discontinued due to mechanics Supine shoulder horizontal abduction YTB 3 x 15 Standing shoulder ABC with ball at wall 2x  02/22/23 Pt received L shoulder PROM in flex, abd, and ER per pt and tissue tolerance w/n protocol ranges Pulleys in flex and scaption x20 each S/L ER with manual assist 3x10 Prone row 2x10 Prone extension to neutral 2x10 Seated on incline bench:  Shoulder flexion AAROM with wand x 5 reps at setting 5, x10 reps at setting 4  Shoulder flexion AROM 2x10 at setting 4 Standing wall walks in flexion x 5 reps   02/18/23 Manual: PROM flexion, abduction, ER, Supine band ER YTB 2 x 10 Sidelying ER with manual assist 3 x 10 Prone shoulder extension 2 x 10  Prone row 2 x 10 Prone horizontal abduction  small range 1 x 10 discontinued due to mechanics Standing shoulder flexion with YTB band in hands 2 x 10 limited range Standing row YTB 3 x 10 Standing shoulder extension YTB 2 x 10  Supine shoulder flexion 2# wand 2 x 10   02/15/23 Pt received L shoulder PROM in flexion, abd, and ER per pt and tissue tolerance w/n protocol ranges Supine wand ER x 10 reps S/L ER with manual assist x 10 reps Standing wall walks in flexion x 7 reps Scap retraction with YTB 2 x 10 reps Supine wand flexion with head elevated on pillows x 10 reps Supine active flexion with head elevated on pillows x 10 reps   FOTO: Initial/Current:  41 / 55 with a goal of 57.    PATIENT EDUCATION: Education details: surgical and protocol limitations and restrictions, objective findings, Dx, relevant  anatomy, POC, goal progress, HEP, and exercise form. Person educated: Patient Education method: Explanation, Demonstration, Tactile cues, Verbal cues Education comprehension: verbalized understanding, returned demonstration, verbal cues required, tactile cues required, and needs further education   HOME EXERCISE PROGRAM: Access Code: [email or web address] URL: [email or web address] Date: 02/12/2023 Prepared by: Greig Castilla Dejuan Elman  Exercises - Supine Shoulder Flexion Extension AAROM with Dowel  - 3 x daily - 7 x weekly - 2 sets - 10 reps - Supine Shoulder External Rotation with Dowel  - 3 x daily - 7 x weekly - 2 sets - 10 reps - Standing 'L' Stretch at Counter  - 3 x daily - 7 x weekly - 2 sets - 10 reps - 5-10 second hold - Supine Single Arm Scapular Protraction  - 1 x daily - 7 x weekly - 3 sets - 10 reps - Sidelying Shoulder Abduction Palm Forward (Mirrored)  - 1 x daily - 7 x weekly - 3 sets - 10 reps - Isometric Shoulder Flexion at Wall (Mirrored)  - 2 x daily - 7 x weekly - 5 reps - 10 second hold - Isometric Shoulder Abduction at Wall  -  5 x daily - 7 x weekly - 5 reps - 10 second hold - Isometric Shoulder Extension at Wall  - 2 x daily - 7 x weekly - 5 reps - 10 second hold - Standing Isometric Shoulder Internal Rotation at Doorway  - 2 x daily - 7 x weekly - 5 reps - 10 second hold - Isometric Shoulder External Rotation at Wall  - 2 x daily - 7 x weekly - 5 reps - 10 second hold - Supine Single Arm Shoulder Protraction  - 1 x daily - 7 x weekly - 3 sets - 10 reps - Prone Shoulder Extension - Single Arm (Mirrored)  - 1 x daily - 7 x weekly - 3 sets - 10 reps - Shoulder Flexion Wall Slide with Towel  - 1 x daily - 7 x weekly - 10 reps - Supine Shoulder Flexion AROM  - 2 x daily - 7 x weekly - 2 sets - 10 reps - Standing Shoulder Flexion Wall Walk  - 1-2 x daily - 7 x weekly - 1 sets - 5 reps - Standing Shoulder Row with Anchored Resistance  - 1 x daily - 7 x weekly - 3 sets - 10  reps - Supine Shoulder External Rotation with Resistance  - 1 x daily - 7 x weekly - 2 sets - 10 reps - Shoulder extension with resistance - Neutral  - 1 x daily - 7 x weekly - 2 sets - 10 reps  03/01/23- Shoulder Alphabet with Ball at [facility]  - 1 x daily - 7 x weekly - 2-3 reps  03/15/23- Prone Single Arm Shoulder Horizontal Abduction with Scapular Retraction and Palm Down  - 1 x daily - 7 x weekly - 3 sets - 10 reps  ASSESSMENT:   CLINICAL IMPRESSION: Patient maintaining ROM well. Continued with shoulder and periscap strengthening. Weakness overhead still present but progressing well. Now able to complete horizontal abduction with minimal compensation. Patient will continue to benefit from physical therapy in order to improve function and reduce impairment.     OBJECTIVE IMPAIRMENTS: decreased activity tolerance, decreased endurance, decreased ROM, decreased strength, hypomobility, impaired flexibility, impaired UE functional use, and pain.    ACTIVITY LIMITATIONS: carrying, lifting, bathing, dressing, reach over head, and hygiene/grooming   PARTICIPATION LIMITATIONS: meal prep, cleaning, laundry, driving, shopping, and community activity   PERSONAL FACTORS: 3+ comorbidities: cervical spondylosis, osteoporosis, R shoulder pain  are also affecting patient's functional outcome.    REHAB POTENTIAL: Good   CLINICAL DECISION MAKING: Stable/uncomplicated   EVALUATION COMPLEXITY: Low     GOALS:   SHORT TERM GOALS:    Pt will be independent and compliant with HEP for improved pain, ROM, strength, and function  Baseline: Goal status: GOAL MET  Target date:  01/24/2023     2.  Pt will demo improved PROM to 110 deg in flexion and 30 deg in ER for improved shoulder mobility and tightness Baseline:  Goal status: GOAL MET  11/19 Target date:  01/17/2023       3.  Pt will demo L shoulder AAROM to 130 deg in flexion (supine) and 30 deg in ER for improved stiffness and  mobility Baseline:  Goal status: GOAL MET  11/19 Target date:  01/31/2023     4.  Pt will be able to actively perform R shoulder flexion AROM to at least 90 deg in supine.  Baseline:  Goal status: GOAL MET  11/19 Target date:  01/24/2023  5.  Pt will be able to actively elevate R shoulder > 90 deg without significant shoulder hike.  Baseline:  Goal status: ONGOING Target date:  02/28/2023     6.  Pt will be able to perform her self care activities with no > than minimal difficulty.   Baseline:  Goal status: PROGRESSING  11/19 Target date:  03/21/2023     LONG TERM GOALS: Target date:   04/18/2023     Pt will be able to perform her ADLs and IADLs without significant difficulty and pain.  Baseline:  Goal status: INITIAL   2.   Pt will demo L shoulder AROM to be [facility] t/o for performance of ADLs and IADLs  Baseline:  Goal status: INITIAL   3.  Pt will be able to perform her normal reaching and functional overhead activities without significant limitation and pain. Baseline:  Goal status: INITIAL   4.  Pt will be able to reach into an overhead cabinet without significant difficulty.  Baseline:  Goal status: INITIAL Target date:  04/04/2023     5.  Pt will progress with strengthening exercises per protocol without adverse effects in order to perform her normal functional lifting/carrying activities and household chores.  Baseline:  Goal status: INITIAL       PLAN:   PT FREQUENCY: 2x/week   PT DURATION: other: 9 weeks   PLANNED INTERVENTIONS: Therapeutic exercises, Therapeutic activity, Neuromuscular re-education, Patient/Family education, Self Care, Aquatic Therapy, Dry Needling, Electrical stimulation, Cryotherapy, Moist heat, Taping, Manual therapy, and Re-evaluation   PLAN FOR NEXT SESSION: Cont per Dr. Suzan Nailer RSA protocol.        Reola Mosher Shantavia Jha, PT 03/15/2023, 9:25 AM

## 2023-03-15 NOTE — Progress Notes (Signed)
Established Patient Office Visit     CC/Reason for Visit: Bilateral lower extremity edema  HPI: Heidi Shah is a 77 y.o. female who is coming in today for the above mentioned reasons.  She has noticed this for the past 4 to 5 months.  It will get worse towards the end of the day and especially on days when she is on her feet for long periods of time.  There is no pain.  It is bilateral and symmetric.   Past Medical/Surgical History: Past Medical History:  Diagnosis Date   Allergy    Anxiety    Arthritis    Borderline glaucoma    Cataract    surgery - Bilateral cataracts removed   Cervical spondylolysis    Chicken pox    Colon polyps    Depression    Diverticulitis    Diverticulosis    Esophageal spasm    Esophageal stricture    Family history of adverse reaction to anesthesia    difficult time waking mother up   GERD (gastroesophageal reflux disease)    Heart murmur    Hypertension    Osteoporosis    Traumatic complete tear of left rotator cuff     Past Surgical History:  Procedure Laterality Date   cataract surgery Bilateral 1996   COLONOSCOPY  2022   EYE SURGERY  2000   detached L retina   HERNIA REPAIR Bilateral 1990   inguinal   NECK SURGERY     age 23-trimmed a muscle due to not being able to hold her head up   POLYPECTOMY  2022   REVERSE SHOULDER ARTHROPLASTY Left 12/16/2022   Procedure: LEFT REVERSE SHOULDER ARTHROPLASTY;  Surgeon: Huel Cote, MD;  Location: [facility] ORS;  Service: Orthopedics;  Laterality: Left;   SHOULDER ARTHROSCOPY WITH ROTATOR CUFF REPAIR AND OPEN BICEPS TENODESIS Left 12/10/2021   Procedure: LEFT SHOULDER ARTHROSCOPY WITH ROTATOR CUFF REPAIR AND OPEN BICEPS TENODESIS;  Surgeon: Huel Cote, MD;  Location: [facility] OR;  Service: Orthopedics;  Laterality: Left;   UPPER GASTROINTESTINAL ENDOSCOPY  2022    Social History:  reports that she has never smoked. She has never used smokeless tobacco. She reports current alcohol  use of about 1.0 standard drink of alcohol per week. She reports that she does not use drugs.  Allergies: Allergies  Allergen Reactions   Nickel Sulfate [Nickel] Rash    Per skin test from dermatologist   Beeswax     Per skin test from dermatologist   Cetrimonium Chloride [Cetrimide]     Per skin test from dermatologist   Lactose Intolerance (Gi) Diarrhea   Methylisothiazolinone     Per skin test from dermatologist   Neomycin Sulfate [Neomycin]     Per skin test from dermatologist   Penicillins     Childhood Reaction    Propolis     Per skin test from dermatologist    Family History:  Family History  Problem Relation Age of Onset   Heart disease Mother 91   COPD Mother    Crohn's disease Mother    Heart disease Father 31   Cancer Father        kidney   Colon cancer Neg Hx    Esophageal cancer Neg Hx    Stomach cancer Neg Hx    Rectal cancer Neg Hx      Current Outpatient Medications:    acetaminophen (TYLENOL) 500 MG tablet, Take 500 mg by mouth every 6 (six) hours as  needed., Disp: , Rfl:    amLODipine (NORVASC) 2.5 MG tablet, Take 1 tablet (2.5 mg total) by mouth daily. (Patient taking differently: Take 2.5 mg by mouth every morning.), Disp: 90 tablet, Rfl: 3   Biotin w/ Vitamins C & E (HAIR SKIN & NAILS GUMMIES PO), Take 2 each by mouth 4 (four) times a week., Disp: , Rfl:    Calcium Carb-Cholecalciferol (CALCIUM 600+D3 PO), Take 1 tablet by mouth daily., Disp: , Rfl:    cetirizine (ZYRTEC) 10 MG chewable tablet, Chew 10 mg by mouth every morning., Disp: , Rfl:    cyanocobalamin (VITAMIN B12) 1000 MCG tablet, Take 1,000 mcg by mouth daily., Disp: , Rfl:    Ginkgo Biloba 60 MG CAPS, Take 60 mg by mouth daily., Disp: , Rfl:    ibuprofen (ADVIL) 800 MG tablet, Take 800 mg by mouth every 8 (eight) hours as needed., Disp: , Rfl:    lisinopril-hydrochlorothiazide (ZESTORETIC) 20-12.5 MG tablet, Take 1 tablet by mouth daily. (Patient taking differently: Take 1 tablet by  mouth every morning.), Disp: 90 tablet, Rfl: 3   Multiple Vitamin (MULTI-VITAMIN) tablet, Take 1 tablet by mouth 4 (four) times a week., Disp: , Rfl:    omeprazole (PRILOSEC) 40 MG capsule, Take 1 capsule (40 mg total) by mouth daily. (Patient taking differently: Take 40 mg by mouth every morning.), Disp: 90 capsule, Rfl: 3   vitamin C (ASCORBIC ACID) 500 MG tablet, Take 500 mg by mouth 4 (four) times a week., Disp: , Rfl:    VYZULTA 0.024 % SOLN, Place 1 drop into both eyes at bedtime., Disp: , Rfl:    oxyCODONE (ROXICODONE) 5 MG immediate release tablet, Take 1 tablet (5 mg total) by mouth every 4 (four) hours as needed for severe pain or breakthrough pain. (Patient not taking: Reported on 03/15/2023), Disp: 10 tablet, Rfl: 0  Review of Systems:  Negative unless indicated in HPI.   Physical Exam: Vitals:   03/15/23 0803  BP: 110/80  Pulse: 82  Temp: 98.1 F (36.7 C)  TempSrc: Oral  SpO2: 98%  Weight: 120 lb 4.8 oz (54.6 kg)    Body mass index is 25.14 kg/m.   Physical Exam Musculoskeletal:        General: Swelling present.      Impression and Plan:  Chronic venous insufficiency  -Mild swelling that worsens towards the end of the day seems to be from chronic venous insufficiency, she does have some lower extremity varicose veins.  Have advised use of compression stockings.  We also advised possibly looking at discontinuing amlodipine in the future if edema does not improve with compression stockings.   Time spent:31 minutes reviewing chart, interviewing and examining patient and formulating plan of care.     Chaya Jan, MD [facility]

## 2023-03-17 ENCOUNTER — Ambulatory Visit (HOSPITAL_BASED_OUTPATIENT_CLINIC_OR_DEPARTMENT_OTHER): Payer: Medicare HMO | Admitting: Physical Therapy

## 2023-03-18 ENCOUNTER — Encounter (HOSPITAL_BASED_OUTPATIENT_CLINIC_OR_DEPARTMENT_OTHER): Payer: Self-pay | Admitting: Physical Therapy

## 2023-03-18 ENCOUNTER — Ambulatory Visit (HOSPITAL_BASED_OUTPATIENT_CLINIC_OR_DEPARTMENT_OTHER): Payer: Medicare HMO | Admitting: Physical Therapy

## 2023-03-18 DIAGNOSIS — M25612 Stiffness of left shoulder, not elsewhere classified: Secondary | ICD-10-CM | POA: Diagnosis not present

## 2023-03-18 DIAGNOSIS — M6281 Muscle weakness (generalized): Secondary | ICD-10-CM

## 2023-03-18 DIAGNOSIS — M25512 Pain in left shoulder: Secondary | ICD-10-CM

## 2023-03-18 NOTE — Therapy (Signed)
OUTPATIENT PHYSICAL THERAPY TREATMENT       Patient Name: Heidi Shah MRN: [id number] DOB:06/01/1945, 77 y.o., female Today's Date: 03/18/2023  END OF SESSION:  PT End of Session - 03/18/23 0905     Visit Number 18    Number of Visits 28    Date for PT Re-Evaluation 04/18/23    Authorization Type [employer] MCR    PT Start Time 0855    PT Stop Time 0937    PT Time Calculation (min) 42 min    Activity Tolerance Patient tolerated treatment well    Behavior During Therapy WFL for tasks assessed/performed               Past Medical History:  Diagnosis Date   Allergy    Anxiety    Arthritis    Borderline glaucoma    Cataract    surgery - Bilateral cataracts removed   Cervical spondylolysis    Chicken pox    Colon polyps    Depression    Diverticulitis    Diverticulosis    Esophageal spasm    Esophageal stricture    Family history of adverse reaction to anesthesia    difficult time waking mother up   GERD (gastroesophageal reflux disease)    Heart murmur    Hypertension    Osteoporosis    Traumatic complete tear of left rotator cuff    Past Surgical History:  Procedure Laterality Date   cataract surgery Bilateral 1996   COLONOSCOPY  2022   EYE SURGERY  2000   detached L retina   HERNIA REPAIR Bilateral 1990   inguinal   NECK SURGERY     age 72-trimmed a muscle due to not being able to hold her head up   POLYPECTOMY  2022   REVERSE SHOULDER ARTHROPLASTY Left 12/16/2022   Procedure: LEFT REVERSE SHOULDER ARTHROPLASTY;  Surgeon: Huel Cote, MD;  Location: [facility];  Service: Orthopedics;  Laterality: Left;   SHOULDER ARTHROSCOPY WITH ROTATOR CUFF REPAIR AND OPEN BICEPS TENODESIS Left 12/10/2021   Procedure: LEFT SHOULDER ARTHROSCOPY WITH ROTATOR CUFF REPAIR AND OPEN BICEPS TENODESIS;  Surgeon: Huel Cote, MD;  Location: [facility];  Service: Orthopedics;  Laterality: Left;   UPPER GASTROINTESTINAL ENDOSCOPY  2022   Patient Active Problem List    Diagnosis Date Noted   Traumatic complete tear of left rotator cuff    Cervical spondylosis with myelopathy and radiculopathy 03/14/2020   Neck pain 03/14/2020   Aortic heart murmur 02/25/2020   Age-related osteoporosis without current pathological fracture 04/04/2019   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   Constipation 03/05/2013   GERD (gastroesophageal reflux disease) 01/19/2013   Esophageal spasm 01/19/2013   Dysphagia 01/19/2013   Diverticulitis of colon (without mention of hemorrhage)(562.11) 11/23/2012   History of depression 11/23/2012   Essential hypertension, benign 11/23/2012     THERAPY DIAG:  Left shoulder pain, unspecified chronicity  Stiffness of left shoulder, not elsewhere classified  Muscle weakness (generalized)  REFERRING PROVIDER: Huel Cote, MD   REFERRING DIAG: S46.012A (ICD-10-CM) - Traumatic complete tear of left rotator cuff, initial encounter    S/p L RSA   THERAPY DIAG:  Left shoulder pain, unspecified chronicity   Stiffness of left shoulder, not elsewhere classified   Muscle weakness (generalized)   Rationale for Evaluation and Treatment: Rehabilitation   ONSET DATE: DOS 12/16/2022   SUBJECTIVE:  SUBJECTIVE STATEMENT: Pt is 13 weeks and 1 day s/p L RSA.  Pt denies pain currently though states she had pain last night.  Pt reports she was sore the following day after prior Rx which is typical for her.  Pt reports compliance with HEP.  Pt reports she is able to do things at home easier.  Pt is able to lift L UE easier and reaching is a little better though still limited.      Hand dominance: Right   PERTINENT HISTORY: -L shoulder reverse shoulder arthroplasty (RSA) and biceps tenodesis on 12/16/2022.  -L shoulder RCR and biceps tenodesis in 11/2021. -Arthritis,  cervical spondylosis, osteoporosis, HTN -Pt states she does have 2 small tears in her R shoulder and does some pain in R shoulder.     PAIN:  NPRS:  0/10 current, 3-4/10 worst, 0/10 best Pt denies soreness.    PRECAUTIONS: Other: Tears and pain in R shoulder, osteoporosis       WEIGHT BEARING RESTRICTIONS: Yes L UE   FALLS:  Has patient fallen in last 6 months? No   LIVING ENVIRONMENT: Lives with: lives alone Lives in: 1 story home Stairs: has steps to enter home and bilat rails on one entrance     OCCUPATION: Pt is retired.   PLOF: Independent   PATIENT GOALS:  to regain 100% movement in arm and use it as normal     OBJECTIVE:    DIAGNOSTIC FINDINGS:  Pt is post op.      TODAY'S TREATMENT:                                                                                                                                           03/18/2023 Pulleys in flex and scaption approx 20 each Standing rows with GTB 2x15 Standing shoulder extension to neutral x 15 and x 10 Standing Jobe's flexion 1# x 10 Standing shoulder flexion with RTB in hands 2x10 Supine serratus punch 2x10 with 2# S/L ER with assistance and cuing for correct form  2x10 Supine ER with YTB x 10 reps Shelf reach (to 2nd shelf)  2x10  Pt received L shoulder PROM in flexion, abd, and ER in supine per pt and tissue tolerance  03/15/23 Pulley flexion/scaption 3 minutes Manual: grade II-III inferior glides in progressive flexion Supine shoulder horizontal abduction GTB 2 x 10 Supine shoulder bilateral ER GTB 3 x 10 Shoulder PNF D2  RTB 3 x 10  Standing shoulder flexion with band in hands RTB 3 x 10 Standing shoulder abduction with perpendicular resistance YTB 2 x 10  Standing shoulder flexion with perpendicular resistance YTB 2 x 10  Standing shoulder flexion/press with manual assist 1# 2 x 10 Review of prior prone exercises- adding 2# weight Prone horizontal abduction 2 x 10  03/10/23 Pulley  flexion/scaption 3 minutes Supine shoulder flexion supine 3# 2 x 10 Supine shoulder  horizontal abduction RTB 2 x 10 Supine shoulder bilateral ER RTB 2 x 10 Shoulder PNF D2 YTB 1x 10,  RTB 1 x 10  Standing Row GTB 1x15 Standing shoulder extension GTB 1 x 15 Standing shoulder flexion 1# 1 x 10   03/08/23 Pulley flexion/scaption 3 minutes PROM flexion, manual: flexion with inferior glides grade II-III Supine shoulder flexion supine 2 x 10, 45 degree incline 2# bar 3 x 10 Shoulder flexion AROM - cabinet reaches 2 x 10  Shoulder flexion to first shelf in cabinet 1# 2 x 10 Sidelying shoulder ER 3 x 10 Supine shoulder bilateral ER YTB 2 x 10 Supine shoulder horizontal abduction RTB 2 x 10  03/03/23 Standing Row RTB 3 x 10  Standing shoulder extension RTB 3 x 10  Pulley flexion/scaption 3 minutes Supine shoulder flexion on 35 degree incline 2 x 10, 45 degree incline 2 x 10, 45 degree incline 1# bar 1 x 10 Sidelying ER with manual assist 3 x 10 Supine shoulder horizontal abduction YTB 3 x 10 Standing functional IR with stick behind back 1 x 10  Standing lateral raise with elbow bent 2 x 10  03/01/23 Pulley flexion/scaption 3 minutes PROM ER, AAROM ER with cane Prone shoulder extension 1# 2 x 10 Prone row 1# 2 x 10  Standing Row RTB 2 x 10  Standing shoulder extension RTB 2 x 10  Prone horizontal abduction  small range 1 x 10 discontinued due to mechanics Supine shoulder horizontal abduction YTB 3 x 15 Standing shoulder ABC with ball at wall 2x  02/22/23 Pt received L shoulder PROM in flex, abd, and ER per pt and tissue tolerance w/n protocol ranges Pulleys in flex and scaption x20 each S/L ER with manual assist 3x10 Prone row 2x10 Prone extension to neutral 2x10 Seated on incline bench:  Shoulder flexion AAROM with wand x 5 reps at setting 5, x10 reps at setting 4  Shoulder flexion AROM 2x10 at setting 4 Standing wall walks in flexion x 5 reps   02/18/23 Manual: PROM  flexion, abduction, ER, Supine band ER YTB 2 x 10 Sidelying ER with manual assist 3 x 10 Prone shoulder extension 2 x 10  Prone row 2 x 10 Prone horizontal abduction  small range 1 x 10 discontinued due to mechanics Standing shoulder flexion with YTB band in hands 2 x 10 limited range Standing row YTB 3 x 10 Standing shoulder extension YTB 2 x 10  Supine shoulder flexion 2# wand 2 x 10     PATIENT EDUCATION: Education details: surgical and protocol limitations and restrictions, Dx, relevant anatomy, POC, HEP, and exercise form. Person educated: Patient Education method: Explanation, Demonstration, Tactile cues, Verbal cues Education comprehension: verbalized understanding, returned demonstration, verbal cues required, tactile cues required, and needs further education   HOME EXERCISE PROGRAM: Access Code: [email or web address] URL: [email or web address] Date: 02/12/2023 Prepared by: Greig Castilla Zaunegger  Exercises - Supine Shoulder Flexion Extension AAROM with Dowel  - 3 x daily - 7 x weekly - 2 sets - 10 reps - Supine Shoulder External Rotation with Dowel  - 3 x daily - 7 x weekly - 2 sets - 10 reps - Standing 'L' Stretch at Counter  - 3 x daily - 7 x weekly - 2 sets - 10 reps - 5-10 second hold - Supine Single Arm Scapular Protraction  - 1 x daily - 7 x weekly - 3 sets - 10 reps - Sidelying Shoulder Abduction Palm Forward (Mirrored)  - 1 x  daily - 7 x weekly - 3 sets - 10 reps - Isometric Shoulder Flexion at Wall (Mirrored)  - 2 x daily - 7 x weekly - 5 reps - 10 second hold - Isometric Shoulder Abduction at Wall  - 5 x daily - 7 x weekly - 5 reps - 10 second hold - Isometric Shoulder Extension at Wall  - 2 x daily - 7 x weekly - 5 reps - 10 second hold - Standing Isometric Shoulder Internal Rotation at Doorway  - 2 x daily - 7 x weekly - 5 reps - 10 second hold - Isometric Shoulder External Rotation at Wall  - 2 x daily - 7 x weekly - 5 reps - 10 second hold - Supine Single Arm  Shoulder Protraction  - 1 x daily - 7 x weekly - 3 sets - 10 reps - Prone Shoulder Extension - Single Arm (Mirrored)  - 1 x daily - 7 x weekly - 3 sets - 10 reps - Shoulder Flexion Wall Slide with Towel  - 1 x daily - 7 x weekly - 10 reps - Supine Shoulder Flexion AROM  - 2 x daily - 7 x weekly - 2 sets - 10 reps - Standing Shoulder Flexion Wall Walk  - 1-2 x daily - 7 x weekly - 1 sets - 5 reps - Standing Shoulder Row with Anchored Resistance  - 1 x daily - 7 x weekly - 3 sets - 10 reps - Supine Shoulder External Rotation with Resistance  - 1 x daily - 7 x weekly - 2 sets - 10 reps - Shoulder extension with resistance - Neutral  - 1 x daily - 7 x weekly - 2 sets - 10 reps  03/01/23- Shoulder Alphabet with Ball at [facility]  - 1 x daily - 7 x weekly - 2-3 reps  03/15/23- Prone Single Arm Shoulder Horizontal Abduction with Scapular Retraction and Palm Down  - 1 x daily - 7 x weekly - 3 sets - 10 reps  ASSESSMENT:   CLINICAL IMPRESSION: Pt is improving with function as evidenced by subjective reports including doing things at home easier and lifting L UE.  Pt is compliant with HEP.  Pt performed exercises well with cuing and instruction in correct form.  Pt fatigued toward the end of set of jobe's flexion with 1#.  PT worked on form with ER.  She required manual assistance and cuing for correct form with S/L ER.  PT decreased resistance with supine resisted ER for improved form.  She is improving with UE elevation.  Pt was able to reach to the 2nd shelf well with shelf reaches and did get fatigued toward the end of the 2nd set.  Pt responded well to Rx stating she felt tired, but had no pain after Rx.  Pt should continue to benefit from cont skilled PT services to address ongoing goals, improve ROM and strength, and to assist in restoring desired level of function.     OBJECTIVE IMPAIRMENTS: decreased activity tolerance, decreased endurance, decreased ROM, decreased strength, hypomobility, impaired  flexibility, impaired UE functional use, and pain.    ACTIVITY LIMITATIONS: carrying, lifting, bathing, dressing, reach over head, and hygiene/grooming   PARTICIPATION LIMITATIONS: meal prep, cleaning, laundry, driving, shopping, and community activity   PERSONAL FACTORS: 3+ comorbidities: cervical spondylosis, osteoporosis, R shoulder pain  are also affecting patient's functional outcome.    REHAB POTENTIAL: Good   CLINICAL DECISION MAKING: Stable/uncomplicated   EVALUATION COMPLEXITY: Low  GOALS:   SHORT TERM GOALS:    Pt will be independent and compliant with HEP for improved pain, ROM, strength, and function  Baseline: Goal status: GOAL MET  Target date:  01/24/2023     2.  Pt will demo improved PROM to 110 deg in flexion and 30 deg in ER for improved shoulder mobility and tightness Baseline:  Goal status: GOAL MET  11/19 Target date:  01/17/2023       3.  Pt will demo L shoulder AAROM to 130 deg in flexion (supine) and 30 deg in ER for improved stiffness and mobility Baseline:  Goal status: GOAL MET  11/19 Target date:  01/31/2023     4.  Pt will be able to actively perform R shoulder flexion AROM to at least 90 deg in supine.  Baseline:  Goal status: GOAL MET  11/19 Target date:  01/24/2023     5.  Pt will be able to actively elevate R shoulder > 90 deg without significant shoulder hike.  Baseline:  Goal status: ONGOING Target date:  02/28/2023     6.  Pt will be able to perform her self care activities with no > than minimal difficulty.   Baseline:  Goal status: PROGRESSING  11/19 Target date:  03/21/2023     LONG TERM GOALS: Target date:   04/18/2023     Pt will be able to perform her ADLs and IADLs without significant difficulty and pain.  Baseline:  Goal status: INITIAL   2.   Pt will demo L shoulder AROM to be [facility] t/o for performance of ADLs and IADLs  Baseline:  Goal status: INITIAL   3.  Pt will be able to perform her normal reaching and  functional overhead activities without significant limitation and pain. Baseline:  Goal status: INITIAL   4.  Pt will be able to reach into an overhead cabinet without significant difficulty.  Baseline:  Goal status: INITIAL Target date:  04/04/2023     5.  Pt will progress with strengthening exercises per protocol without adverse effects in order to perform her normal functional lifting/carrying activities and household chores.  Baseline:  Goal status: INITIAL       PLAN:   PT FREQUENCY: 2x/week   PT DURATION: other: 9 weeks   PLANNED INTERVENTIONS: Therapeutic exercises, Therapeutic activity, Neuromuscular re-education, Patient/Family education, Self Care, Aquatic Therapy, Dry Needling, Electrical stimulation, Cryotherapy, Moist heat, Taping, Manual therapy, and Re-evaluation   PLAN FOR NEXT SESSION: Cont per Dr. Suzan Nailer RSA protocol.      Audie Clear III PT, DPT 03/18/23 2:35 PM

## 2023-03-29 ENCOUNTER — Encounter (HOSPITAL_BASED_OUTPATIENT_CLINIC_OR_DEPARTMENT_OTHER): Payer: Medicare HMO | Admitting: Physical Therapy

## 2023-03-31 ENCOUNTER — Encounter (HOSPITAL_BASED_OUTPATIENT_CLINIC_OR_DEPARTMENT_OTHER): Payer: Self-pay | Admitting: Physical Therapy

## 2023-03-31 ENCOUNTER — Ambulatory Visit (HOSPITAL_BASED_OUTPATIENT_CLINIC_OR_DEPARTMENT_OTHER): Payer: Medicare HMO | Attending: Orthopaedic Surgery | Admitting: Physical Therapy

## 2023-03-31 DIAGNOSIS — M25612 Stiffness of left shoulder, not elsewhere classified: Secondary | ICD-10-CM | POA: Diagnosis not present

## 2023-03-31 DIAGNOSIS — M419 Scoliosis, unspecified: Secondary | ICD-10-CM | POA: Diagnosis not present

## 2023-03-31 DIAGNOSIS — M6281 Muscle weakness (generalized): Secondary | ICD-10-CM | POA: Insufficient documentation

## 2023-03-31 DIAGNOSIS — M25512 Pain in left shoulder: Secondary | ICD-10-CM | POA: Insufficient documentation

## 2023-03-31 NOTE — Therapy (Signed)
 OUTPATIENT PHYSICAL THERAPY TREATMENT       Patient Name: Heidi Shah MRN: [id number] DOB:Jul 29, 1945, 78 y.o., female Today's Date: 04/01/2023  END OF SESSION:  PT End of Session - 03/31/23 1321     Visit Number 19    Number of Visits 28    Date for PT Re-Evaluation 04/18/23    Authorization Type humana MCR    PT Start Time 1318    PT Stop Time 1402    PT Time Calculation (min) 44 min    Activity Tolerance Patient tolerated treatment well    Behavior During Therapy WFL for tasks assessed/performed               Past Medical History:  Diagnosis Date   Allergy     Anxiety    Arthritis    Borderline glaucoma    Cataract    surgery - Bilateral cataracts removed   Cervical spondylolysis    Chicken pox    Colon polyps    Depression    Diverticulitis    Diverticulosis    Esophageal spasm    Esophageal stricture    Family history of adverse reaction to anesthesia    difficult time waking mother up   GERD (gastroesophageal reflux disease)    Heart murmur    Hypertension    Osteoporosis    Traumatic complete tear of left rotator cuff    Past Surgical History:  Procedure Laterality Date   cataract surgery Bilateral 1996   COLONOSCOPY  2022   EYE SURGERY  2000   detached L retina   HERNIA REPAIR Bilateral 1990   inguinal   NECK SURGERY     age 5-trimmed a muscle due to not being able to hold her head up   POLYPECTOMY  2022   REVERSE SHOULDER ARTHROPLASTY Left 12/16/2022   Procedure: LEFT REVERSE SHOULDER ARTHROPLASTY;  Surgeon: Genelle Standing, MD;  Location: [facility] ORS;  Service: Orthopedics;  Laterality: Left;   SHOULDER ARTHROSCOPY WITH ROTATOR CUFF REPAIR AND OPEN BICEPS TENODESIS Left 12/10/2021   Procedure: LEFT SHOULDER ARTHROSCOPY WITH ROTATOR CUFF REPAIR AND OPEN BICEPS TENODESIS;  Surgeon: Genelle Standing, MD;  Location: [facility];  Service: Orthopedics;  Laterality: Left;   UPPER GASTROINTESTINAL ENDOSCOPY  2022   Patient Active Problem List    Diagnosis Date Noted   Traumatic complete tear of left rotator cuff    Cervical spondylosis with myelopathy and radiculopathy 03/14/2020   Neck pain 03/14/2020   Aortic heart murmur 02/25/2020   Age-related osteoporosis without current pathological fracture 04/04/2019   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   Constipation 03/05/2013   GERD (gastroesophageal reflux disease) 01/19/2013   Esophageal spasm 01/19/2013   Dysphagia 01/19/2013   Diverticulitis of colon (without mention of hemorrhage)(562.11) 11/23/2012   History of depression 11/23/2012   Essential hypertension, benign 11/23/2012     THERAPY DIAG:  Left shoulder pain, unspecified chronicity  Stiffness of left shoulder, not elsewhere classified  Muscle weakness (generalized)  REFERRING PROVIDER: Genelle Standing, MD   REFERRING DIAG: S46.012A (ICD-10-CM) - Traumatic complete tear of left rotator cuff, initial encounter    S/p L RSA   THERAPY DIAG:  Left shoulder pain, unspecified chronicity   Stiffness of left shoulder, not elsewhere classified   Muscle weakness (generalized)   Rationale for Evaluation and Treatment: Rehabilitation   ONSET DATE: DOS 12/16/2022   SUBJECTIVE:  SUBJECTIVE STATEMENT: Pt is 15 weeks s/p L RSA.  Pt denies pain currently.  Pt denies any adverse effects after prior Rx.  Pt states she is seeing improvement and is able to lift UE higher.  Pt reports compliance with HEP.  Pt reports she is able to do things at home easier.        Hand dominance: Right   PERTINENT HISTORY: -L shoulder reverse shoulder arthroplasty (RSA) and biceps tenodesis on 12/16/2022.  -L shoulder RCR and biceps tenodesis in 11/2021. -Arthritis, cervical spondylosis, osteoporosis, HTN -Pt states she does have 2 small tears in her R shoulder and  does some pain in R shoulder.     PAIN:  NPRS:  0/10 current, 3-4/10 worst, 0/10 best Pt denies soreness.    PRECAUTIONS: Other: Tears and pain in R shoulder, osteoporosis       WEIGHT BEARING RESTRICTIONS: Yes L UE   FALLS:  Has patient fallen in last 6 months? No   LIVING ENVIRONMENT: Lives with: lives alone Lives in: 1 story home Stairs: has steps to enter home and bilat rails on one entrance     OCCUPATION: Pt is retired.   PLOF: Independent   PATIENT GOALS:  to regain 100% movement in arm and use it as normal     OBJECTIVE:    DIAGNOSTIC FINDINGS:  Pt is post op.      TODAY'S TREATMENT:                                                                                                                                          03/31/2023 Pulleys in flex and scaption approx 20 each Standing Jobe's flexion 1# x 10, 5 reps Standing scaption 2x10 Standing rows with GTB 2x15 Standing shoulder extension to neutral with GTB 2 x 15 Shelf reach 2x10 to 2nd shelf S/L ER with assistance and cuing for correct form  2x10 Supine flexion with 1# 2x10 Supine ER with YTB x15  Pt received L shoulder PROM in flexion, abd, ER, and IR in supine per pt and tissue tolerance   03/18/2023 Pulleys in flex and scaption approx 20 each Standing rows with GTB 2x15 Standing shoulder extension to neutral x 15 and x 10 Standing Jobe's flexion 1# x 10 Standing shoulder flexion with RTB in hands 2x10 Supine serratus punch 2x10 with 2# S/L ER with assistance and cuing for correct form  2x10 Supine ER with YTB x 10 reps Shelf reach (to 2nd shelf)  2x10  Pt received L shoulder PROM in flexion, abd, and ER in supine per pt and tissue tolerance  03/15/23 Pulley flexion/scaption 3 minutes Manual: grade II-III inferior glides in progressive flexion Supine shoulder horizontal abduction GTB 2 x 10 Supine shoulder bilateral ER GTB 3 x 10 Shoulder PNF D2  RTB 3 x 10  Standing shoulder flexion  with band in hands RTB 3 x 10  Standing shoulder abduction with perpendicular resistance YTB 2 x 10  Standing shoulder flexion with perpendicular resistance YTB 2 x 10  Standing shoulder flexion/press with manual assist 1# 2 x 10 Review of prior prone exercises- adding 2# weight Prone horizontal abduction 2 x 10  03/10/23 Pulley flexion/scaption 3 minutes Supine shoulder flexion supine 3# 2 x 10 Supine shoulder horizontal abduction RTB 2 x 10 Supine shoulder bilateral ER RTB 2 x 10 Shoulder PNF D2 YTB 1x 10,  RTB 1 x 10  Standing Row GTB 1x15 Standing shoulder extension GTB 1 x 15 Standing shoulder flexion 1# 1 x 10   03/08/23 Pulley flexion/scaption 3 minutes PROM flexion, manual: flexion with inferior glides grade II-III Supine shoulder flexion supine 2 x 10, 45 degree incline 2# bar 3 x 10 Shoulder flexion AROM - cabinet reaches 2 x 10  Shoulder flexion to first shelf in cabinet 1# 2 x 10 Sidelying shoulder ER 3 x 10 Supine shoulder bilateral ER YTB 2 x 10 Supine shoulder horizontal abduction RTB 2 x 10  03/03/23 Standing Row RTB 3 x 10  Standing shoulder extension RTB 3 x 10  Pulley flexion/scaption 3 minutes Supine shoulder flexion on 35 degree incline 2 x 10, 45 degree incline 2 x 10, 45 degree incline 1# bar 1 x 10 Sidelying ER with manual assist 3 x 10 Supine shoulder horizontal abduction YTB 3 x 10 Standing functional IR with stick behind back 1 x 10  Standing lateral raise with elbow bent 2 x 10  03/01/23 Pulley flexion/scaption 3 minutes PROM ER, AAROM ER with cane Prone shoulder extension 1# 2 x 10 Prone row 1# 2 x 10  Standing Row RTB 2 x 10  Standing shoulder extension RTB 2 x 10  Prone horizontal abduction  small range 1 x 10 discontinued due to mechanics Supine shoulder horizontal abduction YTB 3 x 15 Standing shoulder ABC with ball at wall 2x  02/22/23 Pt received L shoulder PROM in flex, abd, and ER per pt and tissue tolerance w/n protocol  ranges Pulleys in flex and scaption x20 each S/L ER with manual assist 3x10 Prone row 2x10 Prone extension to neutral 2x10 Seated on incline bench:  Shoulder flexion AAROM with wand x 5 reps at setting 5, x10 reps at setting 4  Shoulder flexion AROM 2x10 at setting 4 Standing wall walks in flexion x 5 reps    PATIENT EDUCATION: Education details: surgical and protocol limitations and restrictions, Dx, relevant anatomy, POC, HEP, and exercise form. Person educated: Patient Education method: Explanation, Demonstration, Tactile cues, Verbal cues Education comprehension: verbalized understanding, returned demonstration, verbal cues required, tactile cues required, and needs further education   HOME EXERCISE PROGRAM: Access Code: [email or web address] URL: [email or web address] Date: 02/12/2023 Prepared by: Prentice Zaunegger  Exercises - Supine Shoulder Flexion Extension AAROM with Dowel  - 3 x daily - 7 x weekly - 2 sets - 10 reps - Supine Shoulder External Rotation with Dowel  - 3 x daily - 7 x weekly - 2 sets - 10 reps - Standing 'L' Stretch at Counter  - 3 x daily - 7 x weekly - 2 sets - 10 reps - 5-10 second hold - Supine Single Arm Scapular Protraction  - 1 x daily - 7 x weekly - 3 sets - 10 reps - Sidelying Shoulder Abduction Palm Forward (Mirrored)  - 1 x daily - 7 x weekly - 3 sets - 10 reps - Isometric Shoulder Flexion at Wall (Mirrored)  - 2 x  daily - 7 x weekly - 5 reps - 10 second hold - Isometric Shoulder Abduction at Wall  - 5 x daily - 7 x weekly - 5 reps - 10 second hold - Isometric Shoulder Extension at Wall  - 2 x daily - 7 x weekly - 5 reps - 10 second hold - Standing Isometric Shoulder Internal Rotation at Doorway  - 2 x daily - 7 x weekly - 5 reps - 10 second hold - Isometric Shoulder External Rotation at Wall  - 2 x daily - 7 x weekly - 5 reps - 10 second hold - Supine Single Arm Shoulder Protraction  - 1 x daily - 7 x weekly - 3 sets - 10 reps - Prone Shoulder  Extension - Single Arm (Mirrored)  - 1 x daily - 7 x weekly - 3 sets - 10 reps - Shoulder Flexion Wall Slide with Towel  - 1 x daily - 7 x weekly - 10 reps - Supine Shoulder Flexion AROM  - 2 x daily - 7 x weekly - 2 sets - 10 reps - Standing Shoulder Flexion Wall Walk  - 1-2 x daily - 7 x weekly - 1 sets - 5 reps - Standing Shoulder Row with Anchored Resistance  - 1 x daily - 7 x weekly - 3 sets - 10 reps - Supine Shoulder External Rotation with Resistance  - 1 x daily - 7 x weekly - 2 sets - 10 reps - Shoulder extension with resistance - Neutral  - 1 x daily - 7 x weekly - 2 sets - 10 reps  03/01/23- Shoulder Alphabet with Ball at [facility]  - 1 x daily - 7 x weekly - 2-3 reps  03/15/23- Prone Single Arm Shoulder Horizontal Abduction with Scapular Retraction and Palm Down  - 1 x daily - 7 x weekly - 3 sets - 10 reps  ASSESSMENT:   CLINICAL IMPRESSION: Pt is improving with function as evidenced by subjective reports including doing things at home easier and lifting L UE.  Pt is compliant with HEP.  Pt is improving with L UE elevation.  Pt continues to have weakness in L shoulder though is making progress with strength.  Pt was challenged with standing jobe's flexion with 1#.  She fatigued with 2nd set and her form worsened.  PT had pt stop 2nd set and perform supine flexion with 1#.  Pt required decreased assistance with S/L ER and demonstrated improved form with S/L ER.  She gives great effort with all exercises.  Pt responded well to Rx having no pain or soreness after Rx.  Pt should continue to benefit from cont skilled PT services to address ongoing goals, improve ROM and strength, and to assist in restoring desired level of function.      OBJECTIVE IMPAIRMENTS: decreased activity tolerance, decreased endurance, decreased ROM, decreased strength, hypomobility, impaired flexibility, impaired UE functional use, and pain.    ACTIVITY LIMITATIONS: carrying, lifting, bathing, dressing, reach over  head, and hygiene/grooming   PARTICIPATION LIMITATIONS: meal prep, cleaning, laundry, driving, shopping, and community activity   PERSONAL FACTORS: 3+ comorbidities: cervical spondylosis, osteoporosis, R shoulder pain  are also affecting patient's functional outcome.    REHAB POTENTIAL: Good   CLINICAL DECISION MAKING: Stable/uncomplicated   EVALUATION COMPLEXITY: Low     GOALS:   SHORT TERM GOALS:    Pt will be independent and compliant with HEP for improved pain, ROM, strength, and function  Baseline: Goal status: GOAL MET  Target  date:  01/24/2023     2.  Pt will demo improved PROM to 110 deg in flexion and 30 deg in ER for improved shoulder mobility and tightness Baseline:  Goal status: GOAL MET  11/19 Target date:  01/17/2023       3.  Pt will demo L shoulder AAROM to 130 deg in flexion (supine) and 30 deg in ER for improved stiffness and mobility Baseline:  Goal status: GOAL MET  11/19 Target date:  01/31/2023     4.  Pt will be able to actively perform R shoulder flexion AROM to at least 90 deg in supine.  Baseline:  Goal status: GOAL MET  11/19 Target date:  01/24/2023     5.  Pt will be able to actively elevate R shoulder > 90 deg without significant shoulder hike.  Baseline:  Goal status: ONGOING Target date:  02/28/2023     6.  Pt will be able to perform her self care activities with no > than minimal difficulty.   Baseline:  Goal status: PROGRESSING  11/19 Target date:  03/21/2023     LONG TERM GOALS: Target date:   04/18/2023     Pt will be able to perform her ADLs and IADLs without significant difficulty and pain.  Baseline:  Goal status: INITIAL   2.   Pt will demo L shoulder AROM to be [facility] t/o for performance of ADLs and IADLs  Baseline:  Goal status: INITIAL   3.  Pt will be able to perform her normal reaching and functional overhead activities without significant limitation and pain. Baseline:  Goal status: INITIAL   4.  Pt will be  able to reach into an overhead cabinet without significant difficulty.  Baseline:  Goal status: INITIAL Target date:  04/04/2023     5.  Pt will progress with strengthening exercises per protocol without adverse effects in order to perform her normal functional lifting/carrying activities and household chores.  Baseline:  Goal status: INITIAL       PLAN:   PT FREQUENCY: 2x/week   PT DURATION: other: 9 weeks   PLANNED INTERVENTIONS: Therapeutic exercises, Therapeutic activity, Neuromuscular re-education, Patient/Family education, Self Care, Aquatic Therapy, Dry Needling, Electrical stimulation, Cryotherapy, Moist heat, Taping, Manual therapy, and Re-evaluation   PLAN FOR NEXT SESSION: Cont per Dr. Saundra RSA protocol.  PN next visit.   Leigh Minerva III PT, DPT 04/01/23 3:25 PM

## 2023-04-01 ENCOUNTER — Encounter (HOSPITAL_BASED_OUTPATIENT_CLINIC_OR_DEPARTMENT_OTHER): Payer: Self-pay | Admitting: Physical Therapy

## 2023-04-01 ENCOUNTER — Ambulatory Visit (HOSPITAL_BASED_OUTPATIENT_CLINIC_OR_DEPARTMENT_OTHER): Payer: Medicare HMO

## 2023-04-01 ENCOUNTER — Ambulatory Visit (HOSPITAL_BASED_OUTPATIENT_CLINIC_OR_DEPARTMENT_OTHER): Payer: Medicare HMO | Admitting: Orthopaedic Surgery

## 2023-04-01 DIAGNOSIS — M25511 Pain in right shoulder: Secondary | ICD-10-CM | POA: Diagnosis not present

## 2023-04-01 DIAGNOSIS — M7541 Impingement syndrome of right shoulder: Secondary | ICD-10-CM

## 2023-04-01 DIAGNOSIS — M19011 Primary osteoarthritis, right shoulder: Secondary | ICD-10-CM | POA: Diagnosis not present

## 2023-04-01 NOTE — Progress Notes (Signed)
 Post Operative Evaluation    Procedure/Date of Surgery: Left shoulder reverse shoulder arthroplasty 9/19  Interval History:   Presents today for follow-up of her right shoulder.  She did get very good initial relief from her injection although now she is still having overhead pain.  PMH/PSH/Family History/Social History/Meds/Allergies:    Past Medical History:  Diagnosis Date   Allergy     Anxiety    Arthritis    Borderline glaucoma    Cataract    surgery - Bilateral cataracts removed   Cervical spondylolysis    Chicken pox    Colon polyps    Depression    Diverticulitis    Diverticulosis    Esophageal spasm    Esophageal stricture    Family history of adverse reaction to anesthesia    difficult time waking mother up   GERD (gastroesophageal reflux disease)    Heart murmur    Hypertension    Osteoporosis    Traumatic complete tear of left rotator cuff    Past Surgical History:  Procedure Laterality Date   cataract surgery Bilateral 1996   COLONOSCOPY  2022   EYE SURGERY  2000   detached L retina   HERNIA REPAIR Bilateral 1990   inguinal   NECK SURGERY     age 39-trimmed a muscle due to not being able to hold her head up   POLYPECTOMY  2022   REVERSE SHOULDER ARTHROPLASTY Left 12/16/2022   Procedure: LEFT REVERSE SHOULDER ARTHROPLASTY;  Surgeon: Genelle Standing, MD;  Location: [facility] ORS;  Service: Orthopedics;  Laterality: Left;   SHOULDER ARTHROSCOPY WITH ROTATOR CUFF REPAIR AND OPEN BICEPS TENODESIS Left 12/10/2021   Procedure: LEFT SHOULDER ARTHROSCOPY WITH ROTATOR CUFF REPAIR AND OPEN BICEPS TENODESIS;  Surgeon: Genelle Standing, MD;  Location: [facility] OR;  Service: Orthopedics;  Laterality: Left;   UPPER GASTROINTESTINAL ENDOSCOPY  2022   Social History   Socioeconomic History   Marital status: Divorced    Spouse name: Not on file   Number of children: 2   Years of education: master's degree   Highest education level: Master's  degree (e.g., MA, MS, MEng, MEd, MSW, MBA)  Occupational History   Occupation: retired runner, broadcasting/film/video  Tobacco Use   Smoking status: Never   Smokeless tobacco: Never  Vaping Use   Vaping status: Never Used  Substance and Sexual Activity   Alcohol use: Yes    Alcohol/week: 1.0 standard drink of alcohol    Types: 1 Glasses of wine per week    Comment: one or two glasses of wine a couple times a week   Drug use: No   Sexual activity: Not on file  Other Topics Concern   Not on file  Social History Narrative      Lives alone in ranch home.    2 children   Son lives in [place]    Continues to teach virtually part time   Social Drivers of Health   Financial Resource Strain: Low Risk  (01/18/2023)   Overall Financial Resource Strain (CARDIA)    Difficulty of Paying Living Expenses: Not hard at all  Food Insecurity: No Food Insecurity (01/18/2023)   Hunger Vital Sign    Worried About Running Out of Food in the Last Year: Never true    Ran Out of Food in the Last Year: Never  true  Transportation Needs: No Transportation Needs (01/18/2023)   PRAPARE - Administrator, Civil Service (Medical): No    Lack of Transportation (Non-Medical): No  Physical Activity: Unknown (01/18/2023)   Exercise Vital Sign    Days of Exercise per Week: 1 day    Minutes of Exercise per Session: Patient declined  Recent Concern: Physical Activity - Inactive (12/03/2022)   Exercise Vital Sign    Days of Exercise per Week: 0 days    Minutes of Exercise per Session: 60 min  Stress: No Stress Concern Present (01/18/2023)   [employer] of Occupational Health - Occupational Stress Questionnaire    Feeling of Stress : Not at all  Social Connections: Moderately Integrated (01/18/2023)   Social Connection and Isolation Panel [NHANES]    Frequency of Communication with Friends and Family: Once a week    Frequency of Social Gatherings with Friends and Family: Twice a week    Attends Religious Services:  More than 4 times per year    Active Member of [employer] or Organizations: Yes    Attends Engineer, Structural: More than 4 times per year    Marital Status: Divorced   Family History  Problem Relation Age of Onset   Heart disease Mother 23   COPD Mother    Crohn's disease Mother    Heart disease Father 22   Cancer Father        kidney   Colon cancer Neg Hx    Esophageal cancer Neg Hx    Stomach cancer Neg Hx    Rectal cancer Neg Hx    Allergies  Allergen Reactions   Nickel Sulfate [Nickel] Rash    Per skin test from dermatologist   Beeswax     Per skin test from dermatologist   Cetrimonium Chloride [Cetrimide]     Per skin test from dermatologist   Lactose Intolerance (Gi) Diarrhea   Methylisothiazolinone     Per skin test from dermatologist   Neomycin Sulfate [Neomycin]     Per skin test from dermatologist   Penicillins     Childhood Reaction    Propolis     Per skin test from dermatologist   Current Outpatient Medications  Medication Sig Dispense Refill   acetaminophen  (TYLENOL ) 500 MG tablet Take 500 mg by mouth every 6 (six) hours as needed.     amLODipine  (NORVASC ) 2.5 MG tablet Take 1 tablet (2.5 mg total) by mouth daily. (Patient taking differently: Take 2.5 mg by mouth every morning.) 90 tablet 3   Biotin w/ Vitamins C & E (HAIR SKIN & NAILS GUMMIES PO) Take 2 each by mouth 4 (four) times a week.     Calcium Carb-Cholecalciferol (CALCIUM 600+D3 PO) Take 1 tablet by mouth daily.     cetirizine (ZYRTEC) 10 MG chewable tablet Chew 10 mg by mouth every morning.     cyanocobalamin (VITAMIN B12) 1000 MCG tablet Take 1,000 mcg by mouth daily.     Ginkgo Biloba 60 MG CAPS Take 60 mg by mouth daily.     ibuprofen  (ADVIL ) 800 MG tablet Take 800 mg by mouth every 8 (eight) hours as needed.     lisinopril -hydrochlorothiazide  (ZESTORETIC ) 20-12.5 MG tablet Take 1 tablet by mouth daily. (Patient taking differently: Take 1 tablet by mouth every morning.) 90 tablet 3    Multiple Vitamin (MULTI-VITAMIN) tablet Take 1 tablet by mouth 4 (four) times a week.     omeprazole  (PRILOSEC) 40 MG capsule Take 1 capsule (  40 mg total) by mouth daily. (Patient taking differently: Take 40 mg by mouth every morning.) 90 capsule 3   oxyCODONE  (ROXICODONE ) 5 MG immediate release tablet Take 1 tablet (5 mg total) by mouth every 4 (four) hours as needed for severe pain or breakthrough pain. (Patient not taking: Reported on 03/15/2023) 10 tablet 0   vitamin C (ASCORBIC ACID) 500 MG tablet Take 500 mg by mouth 4 (four) times a week.     VYZULTA 0.024 % SOLN Place 1 drop into both eyes at bedtime.     No current facility-administered medications for this visit.   No results found.   Review of Systems:   A ROS was performed including pertinent positives and negatives as documented in the HPI.   Musculoskeletal Exam:    There were no vitals taken for this visit.  Left incision is well-appearing without erythema or drainage.  In the spine position she is able to forward elevate to 135 with external rotation at the side to 45 actively.  Internal rotation to back pocket.  Distal neurosensory exam is intact  Right shoulder with tenderness to palpation about the lateral deltoid.  She has weakness 4 out of 5 with forward elevation.  Negative belly press.  External rotation at the side is to 45 bilaterally.  Forward elevation is to 170 with pain and weakness on the right.  Positive Neer impingement  Imaging:    3 views right shoulder: High riding humeral head consistent with early rotator cuff arthropathy  I personally reviewed and interpreted the radiographs.   Assessment:   12 week status post left shoulder reverse shoulder arthroplasty without evidence of complication.  Overall she is continuing to improve dramatically.  I would like to see her back in 3 months for reassessment  With regard to the right shoulder I did describe that I do unfortunately believe she may have a  rotator cuff tear particularly given that her x-ray does show a high riding humeral head she does have a history of a contralateral rotator cuff tear.  As a result we will plan for an MRI of this right shoulder given the fact that she has now failed an injection.  Plan :    -Plan for MRI right shoulder and follow-up discuss results      I personally saw and evaluated the patient, and participated in the management and treatment plan.  Elspeth Parker, MD Attending Physician, Orthopedic Surgery  This document was dictated using Dragon voice recognition software. A reasonable attempt at proof reading has been made to minimize errors.

## 2023-04-02 ENCOUNTER — Encounter (HOSPITAL_BASED_OUTPATIENT_CLINIC_OR_DEPARTMENT_OTHER): Payer: Self-pay | Admitting: Physical Therapy

## 2023-04-02 ENCOUNTER — Ambulatory Visit (HOSPITAL_BASED_OUTPATIENT_CLINIC_OR_DEPARTMENT_OTHER): Payer: Medicare HMO | Admitting: Physical Therapy

## 2023-04-02 DIAGNOSIS — M25612 Stiffness of left shoulder, not elsewhere classified: Secondary | ICD-10-CM

## 2023-04-02 DIAGNOSIS — M6281 Muscle weakness (generalized): Secondary | ICD-10-CM

## 2023-04-02 DIAGNOSIS — M419 Scoliosis, unspecified: Secondary | ICD-10-CM | POA: Diagnosis not present

## 2023-04-02 DIAGNOSIS — M25512 Pain in left shoulder: Secondary | ICD-10-CM

## 2023-04-02 NOTE — Therapy (Signed)
 OUTPATIENT PHYSICAL THERAPY TREATMENT   Progress Note Reporting Period 02/18/2023 to 04/02/2023  See note below for Objective Data and Assessment of Progress/Goals.          Patient Name: Heidi Shah MRN: [id number] DOB:[birth year], 78 y.o., female Today's Date: 04/02/2023  END OF SESSION:  PT End of Session - 04/02/23 1158     Visit Number 20    Number of Visits 30    Date for PT Re-Evaluation 05/07/23    Authorization Type human[employer] MCR    PT Start Time 1150    PT Stop Time 1240    PT Time Calculation (min) 50 min    Activity Tolerance Patient tolerated treatment well    Behavior During Therapy WFL for tasks assessed/performed                Past Medical History:  Diagnosis Date   Allergy     Anxiety    Arthritis    Borderline glaucoma    Cataract    surgery - Bilateral cataracts removed   Cervical spondylolysis    Chicken pox    Colon polyps    Depression    Diverticulitis    Diverticulosis    Esophageal spasm    Esophageal stricture    Family history of adverse reaction to anesthesia    difficult time waking mother up   GERD (gastroesophageal reflux disease)    Heart murmur    Hypertension    Osteoporosis    Traumatic complete tear of left rotator cuff    Past Surgical History:  Procedure Laterality Date   cataract surgery Bilateral 1996   COLONOSCOPY  2022   EYE SURGERY  2000   detached L retina   HERNIA REPAIR Bilateral 1990   inguinal   NECK SURGERY     age 49-trimmed a muscle due to not being able to hold her head up   POLYPECTOMY  2022   REVERSE SHOULDER ARTHROPLASTY Left 12/16/2022   Procedure: LEFT REVERSE SHOULDER ARTHROPLASTY;  Surgeon: Genelle Standing, MD;  Location: [facility];  Service: Orthopedics;  Laterality: Left;   SHOULDER ARTHROSCOPY WITH ROTATOR CUFF REPAIR AND OPEN BICEPS TENODESIS Left 12/10/2021   Procedure: LEFT SHOULDER ARTHROSCOPY WITH ROTATOR CUFF REPAIR AND OPEN BICEPS TENODESIS;  Surgeon: Genelle Standing, MD;   Location: [facility];  Service: Orthopedics;  Laterality: Left;   UPPER GASTROINTESTINAL ENDOSCOPY  2022   Patient Active Problem List   Diagnosis Date Noted   Traumatic complete tear of left rotator cuff    Cervical spondylosis with myelopathy and radiculopathy 03/14/2020   Neck pain 03/14/2020   Aortic heart murmur 02/25/2020   Age-related osteoporosis without current pathological fracture 04/04/2019   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   Constipation 03/05/2013   GERD (gastroesophageal reflux disease) 01/19/2013   Esophageal spasm 01/19/2013   Dysphagia 01/19/2013   Diverticulitis of colon (without mention of hemorrhage)(562.11) 11/23/2012   History of depression 11/23/2012   Essential hypertension, benign 11/23/2012     THERAPY DIAG:  Left shoulder pain, unspecified chronicity  Muscle weakness (generalized)  Stiffness of left shoulder, not elsewhere classified  REFERRING PROVIDER: Genelle Standing, MD   REFERRING DIAG: S46.012A (ICD-10-CM) - Traumatic complete tear of left rotator cuff, initial encounter    S/p L RSA   THERAPY DIAG:  Left shoulder pain, unspecified chronicity   Stiffness of left shoulder, not elsewhere classified   Muscle weakness (generalized)   Rationale for Evaluation and Treatment: Rehabilitation   ONSET DATE: DOS 12/16/2022   SUBJECTIVE:  SUBJECTIVE STATEMENT: Pt is 15 weeks and 2 days s/p L RSA.  Pt saw MD yesterday.  She states MD was pleased with L shoulder.  He checked her R shoulder out and scheduled a MRI for the R shoulder.  RESPONSE TO PRIOR RX:  Pt denies any adverse effects after prior Rx. HEP COMPLIANCE:  Pt reports compliance with HEP.   FUNCTIONAL IMPROVEMENTS:  Pt states she is reaching better including into the overhead cabinet.  She is able to grab a plate  or cup out of the cabinet.  Washing/drying hair.  Dressing.  I can do most things now.  Pt reports she is able to do things at home easier.  Pt reports slight difficulty with self care activities.   FUNCTIONAL LIMITATIONS:  overhead activities though is better.  Form with reaching overhead.  Reaching behind her back.  Reaching across body.     Hand dominance: Right   PERTINENT HISTORY: -L shoulder reverse shoulder arthroplasty (RSA) and biceps tenodesis on 12/16/2022.  -L shoulder RCR and biceps tenodesis in 11/2021. -Arthritis, cervical spondylosis, osteoporosis, HTN -Pt states she does have 2 small tears in her R shoulder and does some pain in R shoulder.     PAIN:  NPRS:  0/10 current, 2-3/10 worst at night, 0/10 best Pt denies soreness.    PRECAUTIONS: Other: Tears and pain in R shoulder, osteoporosis       WEIGHT BEARING RESTRICTIONS: Yes L UE   FALLS:  Has patient fallen in last 6 months? No   LIVING ENVIRONMENT: Lives with: lives alone Lives in: 1 story home Stairs: has steps to enter home and bilat rails on one entrance     OCCUPATION: Pt is retired.   PLOF: Independent   PATIENT GOALS:  to regain 100% movement in arm and use it as normal     OBJECTIVE:    DIAGNOSTIC FINDINGS:  Pt is post op.      TODAY'S TREATMENT:                                                                                                                                          03/31/2023  L shoulder ROM: AROM in standing:   Flexion:  128 with UT compensatory hike   Scaption:  100 deg with UT compensatory hike  AROM in supine:   Flexion:  147   ER:  60   IR:  54  PROM:    Flex: 158  FOTO:  Prior/Current:  55 / 63.  Goal of 57.  Pulleys in flex, scaption, and abd approx 20 each Supine flexion with 1# 2x10 S/L shoulder abduction 2x10 Supine serratus punch with 2#  3x10 with assistance and cuing for correct form Standing scaption 2x10 S/L ER with cuing for correct form   2x10 Prone hz abd 2x10 Shelf reach x10 to 2nd shelf  Reviewed HEP and answered questions  educating pt with appropriate frequency.    03/18/2023 Pulleys in flex and scaption approx 20 each Standing rows with GTB 2x15 Standing shoulder extension to neutral x 15 and x 10 Standing Jobe's flexion 1# x 10 Standing shoulder flexion with RTB in hands 2x10 Supine serratus punch 2x10 with 2# S/L ER with assistance and cuing for correct form  2x10 Supine ER with YTB x 10 reps Shelf reach (to 2nd shelf)  2x10  Pt received L shoulder PROM in flexion, abd, and ER in supine per pt and tissue tolerance  03/15/23 Pulley flexion/scaption 3 minutes Manual: grade II-III inferior glides in progressive flexion Supine shoulder horizontal abduction GTB 2 x 10 Supine shoulder bilateral ER GTB 3 x 10 Shoulder PNF D2  RTB 3 x 10  Standing shoulder flexion with band in hands RTB 3 x 10 Standing shoulder abduction with perpendicular resistance YTB 2 x 10  Standing shoulder flexion with perpendicular resistance YTB 2 x 10  Standing shoulder flexion/press with manual assist 1# 2 x 10 Review of prior prone exercises- adding 2# weight Prone horizontal abduction 2 x 10  03/10/23 Pulley flexion/scaption 3 minutes Supine shoulder flexion supine 3# 2 x 10 Supine shoulder horizontal abduction RTB 2 x 10 Supine shoulder bilateral ER RTB 2 x 10 Shoulder PNF D2 YTB 1x 10,  RTB 1 x 10  Standing Row GTB 1x15 Standing shoulder extension GTB 1 x 15 Standing shoulder flexion 1# 1 x 10   03/08/23 Pulley flexion/scaption 3 minutes PROM flexion, manual: flexion with inferior glides grade II-III Supine shoulder flexion supine 2 x 10, 45 degree incline 2# bar 3 x 10 Shoulder flexion AROM - cabinet reaches 2 x 10  Shoulder flexion to first shelf in cabinet 1# 2 x 10 Sidelying shoulder ER 3 x 10 Supine shoulder bilateral ER YTB 2 x 10 Supine shoulder horizontal abduction RTB 2 x 10  03/03/23 Standing Row RTB 3 x  10  Standing shoulder extension RTB 3 x 10  Pulley flexion/scaption 3 minutes Supine shoulder flexion on 35 degree incline 2 x 10, 45 degree incline 2 x 10, 45 degree incline 1# bar 1 x 10 Sidelying ER with manual assist 3 x 10 Supine shoulder horizontal abduction YTB 3 x 10 Standing functional IR with stick behind back 1 x 10  Standing lateral raise with elbow bent 2 x 10  03/01/23 Pulley flexion/scaption 3 minutes PROM ER, AAROM ER with cane Prone shoulder extension 1# 2 x 10 Prone row 1# 2 x 10  Standing Row RTB 2 x 10  Standing shoulder extension RTB 2 x 10  Prone horizontal abduction  small range 1 x 10 discontinued due to mechanics Supine shoulder horizontal abduction YTB 3 x 15 Standing shoulder ABC with ball at wall 2x      PATIENT EDUCATION: Education details: surgical and protocol limitations and restrictions, Dx, relevant anatomy, POC, HEP, and exercise form. Person educated: Patient Education method: Explanation, Demonstration, Tactile cues, Verbal cues Education comprehension: verbalized understanding, returned demonstration, verbal cues required, tactile cues required, and needs further education   HOME EXERCISE PROGRAM: Access Code: [email or web address] URL: [email or web address] Date: 02/12/2023 Prepared by: Prentice Zaunegger  Exercises - Supine Shoulder Flexion Extension AAROM with Dowel  - 3 x daily - 7 x weekly - 2 sets - 10 reps - Supine Shoulder External Rotation with Dowel  - 3 x daily - 7 x weekly - 2 sets - 10 reps - Standing 'L' Stretch at Counter  - 3 x  daily - 7 x weekly - 2 sets - 10 reps - 5-10 second hold - Supine Single Arm Scapular Protraction  - 1 x daily - 7 x weekly - 3 sets - 10 reps - Sidelying Shoulder Abduction Palm Forward (Mirrored)  - 1 x daily - 7 x weekly - 3 sets - 10 reps - Isometric Shoulder Flexion at Wall (Mirrored)  - 2 x daily - 7 x weekly - 5 reps - 10 second hold - Isometric Shoulder Abduction at Wall  - 5 x daily - 7 x  weekly - 5 reps - 10 second hold - Isometric Shoulder Extension at Wall  - 2 x daily - 7 x weekly - 5 reps - 10 second hold - Standing Isometric Shoulder Internal Rotation at Doorway  - 2 x daily - 7 x weekly - 5 reps - 10 second hold - Isometric Shoulder External Rotation at Wall  - 2 x daily - 7 x weekly - 5 reps - 10 second hold - Supine Single Arm Shoulder Protraction  - 1 x daily - 7 x weekly - 3 sets - 10 reps - Prone Shoulder Extension - Single Arm (Mirrored)  - 1 x daily - 7 x weekly - 3 sets - 10 reps - Shoulder Flexion Wall Slide with Towel  - 1 x daily - 7 x weekly - 10 reps - Supine Shoulder Flexion AROM  - 2 x daily - 7 x weekly - 2 sets - 10 reps - Standing Shoulder Flexion Wall Walk  - 1-2 x daily - 7 x weekly - 1 sets - 5 reps - Standing Shoulder Row with Anchored Resistance  - 1 x daily - 7 x weekly - 3 sets - 10 reps - Supine Shoulder External Rotation with Resistance  - 1 x daily - 7 x weekly - 2 sets - 10 reps - Shoulder extension with resistance - Neutral  - 1 x daily - 7 x weekly - 2 sets - 10 reps  03/01/23- Shoulder Alphabet with Ball at [facility]  - 1 x daily - 7 x weekly - 2-3 reps  03/15/23- Prone Single Arm Shoulder Horizontal Abduction with Scapular Retraction and Palm Down  - 1 x daily - 7 x weekly - 3 sets - 10 reps  ASSESSMENT:   CLINICAL IMPRESSION: Pt is progressing with ROM, strength, function, and tolerance to activity.  She has improved with performance of ADLs and IADLs including dressing, hair care, and reaching.  Pt reports slight difficulty with self care activities.  She is able to reach into an overhead cabinet and grab a plate or cup out of the cabinet.  She is still limited with reaching activities including overhead activities.  Though pt is improving with UE elevation, she continues to have a compensatory shoulder hike.  She demonstrates improved ROM t/o R shoulder.  Pt continues to have weakness in L shoulder though is making progress with strength.  Pt  reports compliance with HEP.  Pt demonstrates improved self perceive disability with FOTO score improving from 55 to 63.  Pt met her FOTO goal.  Pt has met all STG's except #5 and also met LTG #4.  Pt should continue to benefit from cont skilled PT services per protocol to address ongoing goals, improve ROM and strength, and to assist in restoring desired level of function.      OBJECTIVE IMPAIRMENTS: decreased activity tolerance, decreased endurance, decreased ROM, decreased strength, hypomobility, impaired flexibility, impaired UE functional use, and pain.  ACTIVITY LIMITATIONS: carrying, lifting, bathing, dressing, reach over head, and hygiene/grooming   PARTICIPATION LIMITATIONS: meal prep, cleaning, laundry, driving, shopping, and community activity   PERSONAL FACTORS: 3+ comorbidities: cervical spondylosis, osteoporosis, R shoulder pain  are also affecting patient's functional outcome.    REHAB POTENTIAL: Good   CLINICAL DECISION MAKING: Stable/uncomplicated   EVALUATION COMPLEXITY: Low     GOALS:   SHORT TERM GOALS:    Pt will be independent and compliant with HEP for improved pain, ROM, strength, and function  Baseline: Goal status: GOAL MET  Target date:  01/24/2023     2.  Pt will demo improved PROM to 110 deg in flexion and 30 deg in ER for improved shoulder mobility and tightness Baseline:  Goal status: GOAL MET  11/19 Target date:  01/17/2023       3.  Pt will demo L shoulder AAROM to 130 deg in flexion (supine) and 30 deg in ER for improved stiffness and mobility Baseline:  Goal status: GOAL MET  11/19 Target date:  01/31/2023     4.  Pt will be able to actively perform R shoulder flexion AROM to at least 90 deg in supine.  Baseline:  Goal status: GOAL MET  11/19 Target date:  01/24/2023     5.  Pt will be able to actively elevate R shoulder > 90 deg without significant shoulder hike.  Baseline:  Goal status:  PROGRESSING  1/4 Target date:   02/28/2023     6.  Pt will be able to perform her self care activities with no > than minimal difficulty.   Baseline:  Goal status:  GOAL MET  1/4 Target date:  03/21/2023     LONG TERM GOALS: Target date:   05/07/2023     Pt will be able to perform her ADLs and IADLs without significant difficulty and pain.  Baseline:  Goal status: PROGRESSING  1/4   2.   Pt will demo L shoulder AROM to be [facility] t/o for performance of ADLs and IADLs  Baseline:  Goal status: PROGRESSING  1/4   3.  Pt will be able to perform her normal reaching and functional overhead activities without significant limitation and pain. Baseline:  Goal status:  OINGOING  1/4   4.  Pt will be able to reach into an overhead cabinet without significant difficulty.  Baseline:  Goal status:  GOAL MET 1/4 Target date:  04/04/2023     5.  Pt will progress with strengthening exercises per protocol without adverse effects in order to perform her normal functional lifting/carrying activities and household chores.  Baseline:  Goal status: PROGRESSING   1/4       PLAN:   PT FREQUENCY: 2x/week   PT DURATION: 5 weeks   PLANNED INTERVENTIONS: Therapeutic exercises, Therapeutic activity, Neuromuscular re-education, Patient/Family education, Self Care, Aquatic Therapy, Dry Needling, Electrical stimulation, Cryotherapy, Moist heat, Taping, Manual therapy, and Re-evaluation   PLAN FOR NEXT SESSION: Cont per Dr. Saundra RSA protocol.    Leigh Minerva III PT, DPT 04/02/23 12:55 PM

## 2023-04-02 NOTE — Addendum Note (Signed)
 Addended by: Susy Manor on: 04/02/2023 12:58 PM   Modules accepted: Orders

## 2023-04-04 ENCOUNTER — Inpatient Hospital Stay: Admission: RE | Admit: 2023-04-04 | Payer: Medicare HMO | Source: Ambulatory Visit

## 2023-04-04 ENCOUNTER — Ambulatory Visit (INDEPENDENT_AMBULATORY_CARE_PROVIDER_SITE_OTHER): Payer: Medicare HMO | Admitting: Family Medicine

## 2023-04-04 ENCOUNTER — Encounter: Payer: Self-pay | Admitting: Family Medicine

## 2023-04-04 VITALS — BP 138/84 | HR 65 | Temp 98.3°F | Ht <= 58 in | Wt 119.0 lb

## 2023-04-04 DIAGNOSIS — Z0000 Encounter for general adult medical examination without abnormal findings: Secondary | ICD-10-CM | POA: Diagnosis not present

## 2023-04-04 DIAGNOSIS — E785 Hyperlipidemia, unspecified: Secondary | ICD-10-CM

## 2023-04-04 DIAGNOSIS — I10 Essential (primary) hypertension: Secondary | ICD-10-CM

## 2023-04-04 DIAGNOSIS — M419 Scoliosis, unspecified: Secondary | ICD-10-CM | POA: Diagnosis not present

## 2023-04-04 LAB — LIPID PANEL
Cholesterol: 258 mg/dL — ABNORMAL HIGH (ref 0–200)
HDL: 78.7 mg/dL (ref >39.00)
LDL Cholesterol: 165 mg/dL — ABNORMAL HIGH (ref 0–99)
NonHDL: 179.38
Total CHOL/HDL Ratio: 3
Triglycerides: 70 mg/dL (ref 0.0–149.0)
VLDL: 14 mg/dL (ref 0.0–40.0)

## 2023-04-04 LAB — COMPREHENSIVE METABOLIC PANEL
ALT: 10 U/L (ref 0–35)
AST: 20 U/L (ref 0–37)
Albumin: 4.6 g/dL (ref 3.5–5.2)
Alkaline Phosphatase: 68 U/L (ref 39–117)
BUN: 19 mg/dL (ref 6–23)
CO2: 29 meq/L (ref 19–32)
Calcium: 10 mg/dL (ref 8.4–10.5)
Chloride: 98 meq/L (ref 96–112)
Creatinine, Ser: 0.62 mg/dL (ref 0.40–1.20)
GFR: 87.28 mL/min (ref >60.00)
Glucose, Bld: 94 mg/dL (ref 70–99)
Potassium: 3.6 meq/L (ref 3.5–5.1)
Sodium: 136 meq/L (ref 135–145)
Total Bilirubin: 0.4 mg/dL (ref 0.2–1.2)
Total Protein: 7.1 g/dL (ref 6.0–8.3)

## 2023-04-04 MED ORDER — LORAZEPAM 0.5 MG PO TABS: ORAL_TABLET | ORAL | 0 refills | Status: DC

## 2023-04-04 NOTE — Patient Instructions (Signed)
 Consider RSV vaccine at some point this year.

## 2023-04-04 NOTE — Therapy (Signed)
 OUTPATIENT PHYSICAL THERAPY TREATMENT        Patient Name: Heidi Shah MRN: [id number] DOB:12/31/1945, 78 y.o., female Today's Date: 04/05/2023  END OF SESSION:  PT End of Session - 04/05/23 0848     Visit Number 21    Number of Visits 30    Date for PT Re-Evaluation 05/07/23    Authorization Type human[employer] MCR    PT Start Time 0805    PT Stop Time 0847    PT Time Calculation (min) 42 min    Activity Tolerance Patient tolerated treatment well    Behavior During Therapy WFL for tasks assessed/performed                 Past Medical History:  Diagnosis Date   Allergy     Anxiety    Arthritis    Borderline glaucoma    Cataract    surgery - Bilateral cataracts removed   Cervical spondylolysis    Chicken pox    Colon polyps    Depression    Diverticulitis    Diverticulosis    Esophageal spasm    Esophageal stricture    Family history of adverse reaction to anesthesia    difficult time waking mother up   GERD (gastroesophageal reflux disease)    Heart murmur    Hypertension    Osteoporosis    Traumatic complete tear of left rotator cuff    Past Surgical History:  Procedure Laterality Date   cataract surgery Bilateral 1996   COLONOSCOPY  2022   EYE SURGERY  2000   detached L retina   HERNIA REPAIR Bilateral 1990   inguinal   NECK SURGERY     age 70-trimmed a muscle due to not being able to hold her head up   POLYPECTOMY  2022   REVERSE SHOULDER ARTHROPLASTY Left 12/16/2022   Procedure: LEFT REVERSE SHOULDER ARTHROPLASTY;  Surgeon: Genelle Standing, MD;  Location: [facility];  Service: Orthopedics;  Laterality: Left;   SHOULDER ARTHROSCOPY WITH ROTATOR CUFF REPAIR AND OPEN BICEPS TENODESIS Left 12/10/2021   Procedure: LEFT SHOULDER ARTHROSCOPY WITH ROTATOR CUFF REPAIR AND OPEN BICEPS TENODESIS;  Surgeon: Genelle Standing, MD;  Location: [facility];  Service: Orthopedics;  Laterality: Left;   UPPER GASTROINTESTINAL ENDOSCOPY  2022   Patient Active Problem List    Diagnosis Date Noted   Traumatic complete tear of left rotator cuff    Cervical spondylosis with myelopathy and radiculopathy 03/14/2020   Neck pain 03/14/2020   Aortic heart murmur 02/25/2020   Age-related osteoporosis without current pathological fracture 04/04/2019   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   Constipation 03/05/2013   GERD (gastroesophageal reflux disease) 01/19/2013   Esophageal spasm 01/19/2013   Dysphagia 01/19/2013   Diverticulitis of colon (without mention of hemorrhage)(562.11) 11/23/2012   History of depression 11/23/2012   Essential hypertension, benign 11/23/2012     THERAPY DIAG:  Left shoulder pain, unspecified chronicity  Muscle weakness (generalized)  Stiffness of left shoulder, not elsewhere classified  REFERRING PROVIDER: Genelle Standing, MD   REFERRING DIAG: S46.012A (ICD-10-CM) - Traumatic complete tear of left rotator cuff, initial encounter    S/p L RSA   THERAPY DIAG:  Left shoulder pain, unspecified chronicity   Stiffness of left shoulder, not elsewhere classified   Muscle weakness (generalized)   Rationale for Evaluation and Treatment: Rehabilitation   ONSET DATE: DOS 12/16/2022   SUBJECTIVE:  SUBJECTIVE STATEMENT: Pt is 15 weeks and 5 days s/p L RSA.  Pt denies pain currently though states she had occasional pain (2-3/10) since Saturday.  She states the pain would come and go.   RESPONSE TO PRIOR RX:  Pt denies any adverse effects after prior Rx. HEP COMPLIANCE:  Pt reports compliance with HEP.   FUNCTIONAL IMPROVEMENTS:  Pt states she is reaching better including into the overhead cabinet.  She is able to grab a plate or cup out of the cabinet.  Washing/drying hair.  Dressing.  Pt reports she is able to do things at home easier.  Pt reports slight  difficulty with self care activities.   FUNCTIONAL LIMITATIONS:  overhead activities though is better.  Form with reaching overhead.  Reaching behind her back.  Reaching across body.     Hand dominance: Right   PERTINENT HISTORY: -L shoulder reverse shoulder arthroplasty (RSA) and biceps tenodesis on 12/16/2022.  -L shoulder RCR and biceps tenodesis in 11/2021. -Arthritis, cervical spondylosis, osteoporosis, HTN -Pt states she does have 2 small tears in her R shoulder and does some pain in R shoulder.     PAIN:  NPRS:  0/10 current, 2-3/10 worst at night, 0/10 best Pt denies soreness.    PRECAUTIONS: Other: Tears and pain in R shoulder, osteoporosis       WEIGHT BEARING RESTRICTIONS: Yes L UE   FALLS:  Has patient fallen in last 6 months? No   LIVING ENVIRONMENT: Lives with: lives alone Lives in: 1 story home Stairs: has steps to enter home and bilat rails on one entrance     OCCUPATION: Pt is retired.   PLOF: Independent   PATIENT GOALS:  to regain 100% movement in arm and use it as normal     OBJECTIVE:    DIAGNOSTIC FINDINGS:  Pt is post op.      TODAY'S TREATMENT:                                                                                                                                          04/05/2023 Reviewed pain levels, current function, HEP compliance, and response to prior Rx. Pt received L shoulder PROM in flexion, abd, ER, and IR in supine per pt and tissue tolerance.    Pulleys in flex, scaption, abd x20 each Supine serratus punch 2# 3x10 Supine shoulder flexion 1# 2x10 S/L shoulder abduction 2x10 S/L ER 2x10 Shelf reach x10 to 2nd shelf Standing scaption 2x10 Standing shoulder flexion 1# 1 x 8 Standing shoulder extension with retraction with GTB 2x10 Standing ER with arm a side with YTB 2x10   03/31/2023  L shoulder ROM: AROM in standing:   Flexion:  128 with UT compensatory hike   Scaption:  100 deg with UT compensatory hike  AROM  in supine:   Flexion:  147   ER:  60   IR:  54  PROM:    Flex: 158  FOTO:  Prior/Current:  55 / 63.  Goal of 57.  Pulleys in flex, scaption, and abd approx 20 each Supine flexion with 1# 2x10 S/L shoulder abduction 2x10 Supine serratus punch with 2#  3x10 with assistance and cuing for correct form Standing scaption 2x10 S/L ER with cuing for correct form  2x10 Prone hz abd 2x10 Shelf reach x10 to 2nd shelf  Reviewed HEP and answered questions educating pt with appropriate frequency.    03/18/2023 Pulleys in flex and scaption approx 20 each Standing rows with GTB 2x15 Standing shoulder extension to neutral x 15 and x 10 Standing Jobe's flexion 1# x 10 Standing shoulder flexion with RTB in hands 2x10 Supine serratus punch 2x10 with 2# S/L ER with assistance and cuing for correct form  2x10 Supine ER with YTB x 10 reps Shelf reach (to 2nd shelf)  2x10  Pt received L shoulder PROM in flexion, abd, and ER in supine per pt and tissue tolerance  03/15/23 Pulley flexion/scaption 3 minutes Manual: grade II-III inferior glides in progressive flexion Supine shoulder horizontal abduction GTB 2 x 10 Supine shoulder bilateral ER GTB 3 x 10 Shoulder PNF D2  RTB 3 x 10  Standing shoulder flexion with band in hands RTB 3 x 10 Standing shoulder abduction with perpendicular resistance YTB 2 x 10  Standing shoulder flexion with perpendicular resistance YTB 2 x 10  Standing shoulder flexion/press with manual assist 1# 2 x 10 Review of prior prone exercises- adding 2# weight Prone horizontal abduction 2 x 10  03/10/23 Pulley flexion/scaption 3 minutes Supine shoulder flexion supine 3# 2 x 10 Supine shoulder horizontal abduction RTB 2 x 10 Supine shoulder bilateral ER RTB 2 x 10 Shoulder PNF D2 YTB 1x 10,  RTB 1 x 10  Standing Row GTB 1x15 Standing shoulder extension GTB 1 x 15 Standing shoulder flexion 1# 1 x 10   03/08/23 Pulley flexion/scaption 3 minutes PROM flexion, manual:  flexion with inferior glides grade II-III Supine shoulder flexion supine 2 x 10, 45 degree incline 2# bar 3 x 10 Shoulder flexion AROM - cabinet reaches 2 x 10  Shoulder flexion to first shelf in cabinet 1# 2 x 10 Sidelying shoulder ER 3 x 10 Supine shoulder bilateral ER YTB 2 x 10 Supine shoulder horizontal abduction RTB 2 x 10  03/03/23 Standing Row RTB 3 x 10  Standing shoulder extension RTB 3 x 10  Pulley flexion/scaption 3 minutes Supine shoulder flexion on 35 degree incline 2 x 10, 45 degree incline 2 x 10, 45 degree incline 1# bar 1 x 10 Sidelying ER with manual assist 3 x 10 Supine shoulder horizontal abduction YTB 3 x 10 Standing functional IR with stick behind back 1 x 10  Standing lateral raise with elbow bent 2 x 10     PATIENT EDUCATION: Education details: surgical and protocol limitations and restrictions, Dx, relevant anatomy, POC, HEP, and exercise form. Person educated: Patient Education method: Explanation, Demonstration, Tactile cues, Verbal cues Education comprehension: verbalized understanding, returned demonstration, verbal cues required, tactile cues required, and needs further education   HOME EXERCISE PROGRAM: Access Code: [email or web address] URL: [email or web address] Date: 02/12/2023 Prepared by: Prentice Zaunegger  Exercises - Supine Shoulder Flexion Extension AAROM with Dowel  - 3 x daily - 7 x weekly - 2 sets - 10 reps - Supine Shoulder External Rotation with Dowel  - 3 x daily - 7 x weekly - 2 sets - 10 reps -  Standing 'L' Stretch at Counter  - 3 x daily - 7 x weekly - 2 sets - 10 reps - 5-10 second hold - Supine Single Arm Scapular Protraction  - 1 x daily - 7 x weekly - 3 sets - 10 reps - Sidelying Shoulder Abduction Palm Forward (Mirrored)  - 1 x daily - 7 x weekly - 3 sets - 10 reps - Isometric Shoulder Flexion at Wall (Mirrored)  - 2 x daily - 7 x weekly - 5 reps - 10 second hold - Isometric Shoulder Abduction at Wall  - 5 x daily - 7 x  weekly - 5 reps - 10 second hold - Isometric Shoulder Extension at Wall  - 2 x daily - 7 x weekly - 5 reps - 10 second hold - Standing Isometric Shoulder Internal Rotation at Doorway  - 2 x daily - 7 x weekly - 5 reps - 10 second hold - Isometric Shoulder External Rotation at Wall  - 2 x daily - 7 x weekly - 5 reps - 10 second hold - Supine Single Arm Shoulder Protraction  - 1 x daily - 7 x weekly - 3 sets - 10 reps - Prone Shoulder Extension - Single Arm (Mirrored)  - 1 x daily - 7 x weekly - 3 sets - 10 reps - Shoulder Flexion Wall Slide with Towel  - 1 x daily - 7 x weekly - 10 reps - Supine Shoulder Flexion AROM  - 2 x daily - 7 x weekly - 2 sets - 10 reps - Standing Shoulder Flexion Wall Walk  - 1-2 x daily - 7 x weekly - 1 sets - 5 reps - Standing Shoulder Row with Anchored Resistance  - 1 x daily - 7 x weekly - 3 sets - 10 reps - Supine Shoulder External Rotation with Resistance  - 1 x daily - 7 x weekly - 2 sets - 10 reps - Shoulder extension with resistance - Neutral  - 1 x daily - 7 x weekly - 2 sets - 10 reps  03/01/23- Shoulder Alphabet with Ball at [facility]  - 1 x daily - 7 x weekly - 2-3 reps  03/15/23- Prone Single Arm Shoulder Horizontal Abduction with Scapular Retraction and Palm Down  - 1 x daily - 7 x weekly - 3 sets - 10 reps  ASSESSMENT:   CLINICAL IMPRESSION: Pt is progressing with ROM, strength, function, and tolerance to activity.  She is improving with functional usage of L UE.  Pt has tightness in L shoulder PROM at end ranges which improves with increased reps of PROM.  Pt performed exercises per protocol well with cuing for correct form.  Pt demonstrates improved ER strength with evidence of performance of S/L ER and standing ER with band.  She has improved performance of S/L ER not requiring manual assistance from PT.  Pt has improved form and control with S/L ER requiring minimal cuing for form.  Pt was also able to perform standing ER with YTB today.  Pt's shoulder  fatigued with standing jobe's flexion having increased shoulder compensatory hike toward toward the end of the her set.  PT had pt stop exercises when the compensatory hike increased.  Pt responded well to Rx having no c/o's after Rx.  Pt should continue to benefit from cont skilled PT services per protocol to address ongoing goals, improve ROM and strength, and to assist in restoring desired level of function.      OBJECTIVE IMPAIRMENTS: decreased activity  tolerance, decreased endurance, decreased ROM, decreased strength, hypomobility, impaired flexibility, impaired UE functional use, and pain.    ACTIVITY LIMITATIONS: carrying, lifting, bathing, dressing, reach over head, and hygiene/grooming   PARTICIPATION LIMITATIONS: meal prep, cleaning, laundry, driving, shopping, and community activity   PERSONAL FACTORS: 3+ comorbidities: cervical spondylosis, osteoporosis, R shoulder pain  are also affecting patient's functional outcome.    REHAB POTENTIAL: Good   CLINICAL DECISION MAKING: Stable/uncomplicated   EVALUATION COMPLEXITY: Low     GOALS:   SHORT TERM GOALS:    Pt will be independent and compliant with HEP for improved pain, ROM, strength, and function  Baseline: Goal status: GOAL MET  Target date:  01/24/2023     2.  Pt will demo improved PROM to 110 deg in flexion and 30 deg in ER for improved shoulder mobility and tightness Baseline:  Goal status: GOAL MET  11/19 Target date:  01/17/2023       3.  Pt will demo L shoulder AAROM to 130 deg in flexion (supine) and 30 deg in ER for improved stiffness and mobility Baseline:  Goal status: GOAL MET  11/19 Target date:  01/31/2023     4.  Pt will be able to actively perform R shoulder flexion AROM to at least 90 deg in supine.  Baseline:  Goal status: GOAL MET  11/19 Target date:  01/24/2023     5.  Pt will be able to actively elevate R shoulder > 90 deg without significant shoulder hike.  Baseline:  Goal status:   PROGRESSING  1/4 Target date:  02/28/2023     6.  Pt will be able to perform her self care activities with no > than minimal difficulty.   Baseline:  Goal status:  GOAL MET  1/4 Target date:  03/21/2023     LONG TERM GOALS: Target date:   05/07/2023     Pt will be able to perform her ADLs and IADLs without significant difficulty and pain.  Baseline:  Goal status: PROGRESSING  1/4   2.   Pt will demo L shoulder AROM to be [facility] t/o for performance of ADLs and IADLs  Baseline:  Goal status: PROGRESSING  1/4   3.  Pt will be able to perform her normal reaching and functional overhead activities without significant limitation and pain. Baseline:  Goal status:  OINGOING  1/4   4.  Pt will be able to reach into an overhead cabinet without significant difficulty.  Baseline:  Goal status:  GOAL MET 1/4 Target date:  04/04/2023     5.  Pt will progress with strengthening exercises per protocol without adverse effects in order to perform her normal functional lifting/carrying activities and household chores.  Baseline:  Goal status: PROGRESSING   1/4       PLAN:   PT FREQUENCY: 2x/week   PT DURATION: 5 weeks   PLANNED INTERVENTIONS: Therapeutic exercises, Therapeutic activity, Neuromuscular re-education, Patient/Family education, Self Care, Aquatic Therapy, Dry Needling, Electrical stimulation, Cryotherapy, Moist heat, Taping, Manual therapy, and Re-evaluation   PLAN FOR NEXT SESSION: Cont per Dr. Saundra RSA protocol.    Leigh Minerva III PT, DPT 04/05/23 9:04 AM

## 2023-04-04 NOTE — Progress Notes (Signed)
 Established Patient Office Visit  Subjective   Patient ID: Heidi Shah, female    DOB: Feb 13, 1946  Age: 78 y.o. MRN: [id number]  Chief Complaint  Patient presents with   Annual Exam    HPI   Heidi Shah is here for physical exam.  She has history of recent left reverse shoulder arthroplasty and has developed some recent right shoulder pain and has pending MRI scan.  She has had some anxiety in the past and requesting prescription for lorazepam  to take previous to her scan.  She knows to have someone drive with her.  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  Had some recent increase in low back pain.  Would like referral to consider physical therapy for that  Past medical history reviewed.  She has history of hypertension which has been controlled on 2 drug regimen, GERD, osteoarthritis involving multiple joints  Health maintenance reviewed  -No flu vaccine and she declines at this time. -Also no history of RSV vaccine -Pneumonia vaccines are complete -Colonoscopy repeat due in 2 years -In process of getting DEXA scan scheduled-Shingrix vaccine complete  Family History  Problem Relation Age of Onset   Heart disease Mother 30   COPD Mother    Crohn's disease Mother    Heart disease Father 24   Cancer Father        kidney   Colon cancer Neg Hx    Esophageal cancer Neg Hx    Stomach cancer Neg Hx    Rectal cancer Neg Hx    -Mother had history of atrial fibrillation but no known coronary disease.  Father had history of CAD and renal cell carcinoma  Social history-divorced.  2 children and 4 grandchildren never smoked.  No regular alcohol.  Retired [employer] information [employer] manager  Past Medical History:  Diagnosis Date   Allergy     Anxiety    Arthritis    Borderline glaucoma    Cataract    surgery - Bilateral cataracts removed   Cervical spondylolysis    Chicken pox    Colon polyps    Depression    Diverticulitis    Diverticulosis     Esophageal spasm    Esophageal stricture    Family history of adverse reaction to anesthesia    difficult time waking mother up   GERD (gastroesophageal reflux disease)    Heart murmur    Hypertension    Osteoporosis    Traumatic complete tear of left rotator cuff    Past Surgical History:  Procedure Laterality Date   cataract surgery Bilateral 1996   COLONOSCOPY  2022   EYE SURGERY  2000   detached L retina   HERNIA REPAIR Bilateral 1990   inguinal   NECK SURGERY     age 17-trimmed a muscle due to not being able to hold her head up   POLYPECTOMY  2022   REVERSE SHOULDER ARTHROPLASTY Left 12/16/2022   Procedure: LEFT REVERSE SHOULDER ARTHROPLASTY;  Surgeon: Genelle Standing, MD;  Location: [facility] ORS;  Service: Orthopedics;  Laterality: Left;   SHOULDER ARTHROSCOPY WITH ROTATOR CUFF REPAIR AND OPEN BICEPS TENODESIS Left 12/10/2021   Procedure: LEFT SHOULDER ARTHROSCOPY WITH ROTATOR CUFF REPAIR AND OPEN BICEPS TENODESIS;  Surgeon: Genelle Standing, MD;  Location: [facility] OR;  Service: Orthopedics;  Laterality: Left;   UPPER GASTROINTESTINAL ENDOSCOPY  2022    reports that she has never smoked. She has never used smokeless tobacco. She reports current alcohol use of about 1.0 standard  drink of alcohol per week. She reports that she does not use drugs. family history includes COPD in her mother; Cancer in her father; Crohn's disease in her mother; Heart disease (age of onset: 76) in her mother; Heart disease (age of onset: 9) in her father. Allergies  Allergen Reactions   Nickel Sulfate [Nickel] Rash    Per skin test from dermatologist   Beeswax     Per skin test from dermatologist   Cetrimonium Chloride [Cetrimide]     Per skin test from dermatologist   Lactose Intolerance (Gi) Diarrhea   Methylisothiazolinone     Per skin test from dermatologist   Neomycin Sulfate [Neomycin]     Per skin test from dermatologist   Penicillins     Childhood Reaction    Propolis     Per skin test from  dermatologist      Review of Systems  Constitutional:  Negative for chills, fever, malaise/fatigue and weight loss.  Eyes:  Negative for blurred vision and double vision.  Respiratory:  Negative for cough and shortness of breath.   Cardiovascular:  Negative for chest pain, palpitations and leg swelling.  Gastrointestinal:  Negative for abdominal pain, blood in stool, constipation and diarrhea.  Genitourinary:  Negative for dysuria.  Musculoskeletal:  Positive for back pain.  Skin:  Negative for rash.  Neurological:  Negative for dizziness, speech change, seizures, loss of consciousness and headaches.  Psychiatric/Behavioral:  Negative for depression.       Objective:     BP 138/84   Pulse 65   Temp 98.3 F (36.8 C) (Oral)   Ht 4' 10 (1.473 m)   Wt 119 lb (54 kg)   SpO2 98%   BMI 24.87 kg/m  BP Readings from Last 3 Encounters:  04/04/23 138/84  03/15/23 110/80  01/19/23 126/80   Wt Readings from Last 3 Encounters:  04/04/23 119 lb (54 kg)  03/15/23 120 lb 4.8 oz (54.6 kg)  01/19/23 120 lb (54.4 kg)      Physical Exam Vitals reviewed.  Constitutional:      General: She is not in acute distress.    Appearance: Normal appearance.  HENT:     Right Ear: Tympanic membrane normal.     Left Ear: Tympanic membrane normal.  Cardiovascular:     Rate and Rhythm: Normal rate and regular rhythm.     Heart sounds:     No gallop.  Pulmonary:     Effort: Pulmonary effort is normal.     Breath sounds: Normal breath sounds. No wheezing or rales.  Abdominal:     Palpations: Abdomen is soft. There is no mass.     Tenderness: There is no abdominal tenderness. There is no guarding or rebound.  Musculoskeletal:     Cervical back: Neck supple.     Right lower leg: No edema.     Left lower leg: No edema.  Lymphadenopathy:     Cervical: No cervical adenopathy.  Neurological:     General: No focal deficit present.     Mental Status: She is alert.     Cranial Nerves: No  cranial nerve deficit.      No results found for any visits on 04/04/23.    The 10-year ASCVD risk score (Arnett DK, et al., 2019) is: 24.7%    Assessment & Plan:   Physical exam.  78 year old female with chronic problems as above.  We discussed the following health maintenance issues today  -Recommend she consider flu vaccine  but she declines at this time -Consider RSV vaccine at some point in the next year -Due for repeat colonoscopy in 2 years -Obtain follow-up labs today.  She does have a history of hyperlipidemia.  We discussed limitations of coronary calcium scanning at her age of 10.  She would nevertheless like to repeat lipids today though these have been chronically elevated in the past -Continue current blood pressure medications of lisinopril  HCTZ and amlodipine .  Check comprehensive metabolic panel. -Discussed importance of establishing more consistent weightbearing exercise especially walking.  She is already doing some band exercises for resistance training  She is having some ongoing back issues with known scoliosis.  Set up trial of physical therapy -She has upcoming MRI of the shoulder per orthopedist and requested lorazepam  secondary to anxiety she has had in the past with getting the scans.  Send in lorazepam  0.5 mg 1 to 2 tablets 1 hour prior to procedure and she knows to have someone drive her with this.   No follow-ups on file.    Wolm Scarlet, MD

## 2023-04-05 ENCOUNTER — Encounter (HOSPITAL_BASED_OUTPATIENT_CLINIC_OR_DEPARTMENT_OTHER): Payer: Self-pay | Admitting: Physical Therapy

## 2023-04-05 ENCOUNTER — Ambulatory Visit (HOSPITAL_BASED_OUTPATIENT_CLINIC_OR_DEPARTMENT_OTHER): Payer: Medicare HMO | Admitting: Physical Therapy

## 2023-04-05 DIAGNOSIS — M6281 Muscle weakness (generalized): Secondary | ICD-10-CM | POA: Diagnosis not present

## 2023-04-05 DIAGNOSIS — M25612 Stiffness of left shoulder, not elsewhere classified: Secondary | ICD-10-CM | POA: Diagnosis not present

## 2023-04-05 DIAGNOSIS — M25512 Pain in left shoulder: Secondary | ICD-10-CM | POA: Diagnosis not present

## 2023-04-05 DIAGNOSIS — M419 Scoliosis, unspecified: Secondary | ICD-10-CM | POA: Diagnosis not present

## 2023-04-07 ENCOUNTER — Ambulatory Visit (INDEPENDENT_AMBULATORY_CARE_PROVIDER_SITE_OTHER)
Admission: RE | Admit: 2023-04-07 | Discharge: 2023-04-07 | Disposition: A | Discharge status: Home / Self Care | Payer: Medicare HMO | Source: Ambulatory Visit | Attending: Family Medicine | Admitting: Family Medicine

## 2023-04-07 ENCOUNTER — Ambulatory Visit (HOSPITAL_BASED_OUTPATIENT_CLINIC_OR_DEPARTMENT_OTHER): Payer: Medicare HMO | Admitting: Physical Therapy

## 2023-04-07 ENCOUNTER — Inpatient Hospital Stay: Admission: RE | Admit: 2023-04-07 | Payer: Medicare HMO | Source: Ambulatory Visit

## 2023-04-07 DIAGNOSIS — M810 Age-related osteoporosis without current pathological fracture: Secondary | ICD-10-CM

## 2023-04-08 ENCOUNTER — Ambulatory Visit (HOSPITAL_BASED_OUTPATIENT_CLINIC_OR_DEPARTMENT_OTHER): Payer: Medicare HMO | Admitting: Physical Therapy

## 2023-04-08 MED ORDER — ALENDRONATE SODIUM 70 MG PO TABS: 70.0000 mg | ORAL_TABLET | ORAL | 11 refills | Status: DC

## 2023-04-11 ENCOUNTER — Encounter (HOSPITAL_BASED_OUTPATIENT_CLINIC_OR_DEPARTMENT_OTHER): Payer: Self-pay | Admitting: Orthopaedic Surgery

## 2023-04-11 ENCOUNTER — Encounter: Payer: Self-pay | Admitting: Family Medicine

## 2023-04-12 ENCOUNTER — Ambulatory Visit (HOSPITAL_BASED_OUTPATIENT_CLINIC_OR_DEPARTMENT_OTHER): Payer: Medicare HMO | Admitting: Physical Therapy

## 2023-04-12 ENCOUNTER — Encounter (HOSPITAL_BASED_OUTPATIENT_CLINIC_OR_DEPARTMENT_OTHER): Payer: Self-pay | Admitting: Physical Therapy

## 2023-04-12 DIAGNOSIS — M25612 Stiffness of left shoulder, not elsewhere classified: Secondary | ICD-10-CM

## 2023-04-12 DIAGNOSIS — M25512 Pain in left shoulder: Secondary | ICD-10-CM

## 2023-04-12 DIAGNOSIS — M6281 Muscle weakness (generalized): Secondary | ICD-10-CM

## 2023-04-12 DIAGNOSIS — M419 Scoliosis, unspecified: Secondary | ICD-10-CM | POA: Diagnosis not present

## 2023-04-12 NOTE — Therapy (Signed)
 OUTPATIENT PHYSICAL THERAPY TREATMENT        Patient Name: Heidi Shah MRN: [id number] DOB:[birth year], 78 y.o., female Today's Date: 04/12/2023  END OF SESSION:  PT End of Session - 04/12/23 0815     Visit Number 22    Number of Visits 30    Date for PT Re-Evaluation 05/07/23    Authorization Type [employer] MCR    PT Start Time 0809   Pt arrived 9 mins late to treatment   PT Stop Time 0845    PT Time Calculation (min) 36 min    Activity Tolerance Patient tolerated treatment well    Behavior During Therapy WFL for tasks assessed/performed                 Past Medical History:  Diagnosis Date   Allergy     Anxiety    Arthritis    Borderline glaucoma    Cataract    surgery - Bilateral cataracts removed   Cervical spondylolysis    Chicken pox    Colon polyps    Depression    Diverticulitis    Diverticulosis    Esophageal spasm    Esophageal stricture    Family history of adverse reaction to anesthesia    difficult time waking mother up   GERD (gastroesophageal reflux disease)    Heart murmur    Hypertension    Osteoporosis    Traumatic complete tear of left rotator cuff    Past Surgical History:  Procedure Laterality Date   cataract surgery Bilateral 1996   COLONOSCOPY  2022   EYE SURGERY  2000   detached L retina   HERNIA REPAIR Bilateral 1990   inguinal   NECK SURGERY     age 44-trimmed a muscle due to not being able to hold her head up   POLYPECTOMY  2022   REVERSE SHOULDER ARTHROPLASTY Left 12/16/2022   Procedure: LEFT REVERSE SHOULDER ARTHROPLASTY;  Surgeon: Genelle Standing, MD;  Location: [facility];  Service: Orthopedics;  Laterality: Left;   SHOULDER ARTHROSCOPY WITH ROTATOR CUFF REPAIR AND OPEN BICEPS TENODESIS Left 12/10/2021   Procedure: LEFT SHOULDER ARTHROSCOPY WITH ROTATOR CUFF REPAIR AND OPEN BICEPS TENODESIS;  Surgeon: Genelle Standing, MD;  Location: [facility];  Service: Orthopedics;  Laterality: Left;   UPPER GASTROINTESTINAL ENDOSCOPY   2022   Patient Active Problem List   Diagnosis Date Noted   Traumatic complete tear of left rotator cuff    Cervical spondylosis with myelopathy and radiculopathy 03/14/2020   Neck pain 03/14/2020   Aortic heart murmur 02/25/2020   Age-related osteoporosis without current pathological fracture 04/04/2019   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   Constipation 03/05/2013   GERD (gastroesophageal reflux disease) 01/19/2013   Esophageal spasm 01/19/2013   Dysphagia 01/19/2013   Diverticulitis of colon (without mention of hemorrhage)(562.11) 11/23/2012   History of depression 11/23/2012   Essential hypertension, benign 11/23/2012     THERAPY DIAG:  Left shoulder pain, unspecified chronicity  Muscle weakness (generalized)  Stiffness of left shoulder, not elsewhere classified  REFERRING PROVIDER: Genelle Standing, MD   REFERRING DIAG: S46.012A (ICD-10-CM) - Traumatic complete tear of left rotator cuff, initial encounter    S/p L RSA   THERAPY DIAG:  Left shoulder pain, unspecified chronicity   Stiffness of left shoulder, not elsewhere classified   Muscle weakness (generalized)   Rationale for Evaluation and Treatment: Rehabilitation   ONSET DATE: DOS 12/16/2022   SUBJECTIVE:  SUBJECTIVE STATEMENT: Pt is 16 weeks and 5 days s/p L RSA.  Pt denies pain currently.   RESPONSE TO PRIOR RX:  Pt denies any adverse effects after prior Rx. HEP COMPLIANCE:  Pt reports compliance with HEP.   FUNCTIONAL IMPROVEMENTS:  Pt states she is reaching better including into the overhead cabinet.  She is able to grab a plate or cup out of the cabinet.  Washing/drying hair.  Dressing.  Pt reports she is able to do things at home easier.  Using her UE more without thinking about it.   FUNCTIONAL LIMITATIONS:  overhead  activities though is better.  Form with reaching overhead.  Reaching behind her back.  Reaching across body.     Hand dominance: Right   PERTINENT HISTORY: -L shoulder reverse shoulder arthroplasty (RSA) and biceps tenodesis on 12/16/2022.  -L shoulder RCR and biceps tenodesis in 11/2021. -Arthritis, cervical spondylosis, osteoporosis, HTN -Pt states she does have 2 small tears in her R shoulder and does some pain in R shoulder.     PAIN:  NPRS:  0/10 current, 2-3/10 worst at night, 0/10 best Pt denies soreness.    PRECAUTIONS: Other: Tears and pain in R shoulder, osteoporosis       WEIGHT BEARING RESTRICTIONS: Yes L UE   FALLS:  Has patient fallen in last 6 months? No   LIVING ENVIRONMENT: Lives with: lives alone Lives in: 1 story home Stairs: has steps to enter home and bilat rails on one entrance     OCCUPATION: Pt is retired.   PLOF: Independent   PATIENT GOALS:  to regain 100% movement in arm and use it as normal     OBJECTIVE:    DIAGNOSTIC FINDINGS:  Pt is post op.      TODAY'S TREATMENT:                                                                                                                                          04/12/2023 Reviewed pain levels, current function, HEP compliance, and response to prior Rx. Pt received L shoulder PROM in flexion, abd, ER, and IR in supine per pt and tissue tolerance.    Pulleys in flex, scaption, abd x20 each Supine serratus punch 2# 3x10 Supine shoulder flexion 1# x 10, 2# 2x10 S/L shoulder abduction 2x10 S/L ER 2x10 Shelf reach 2x10 to 2nd shelf Standing scaption 2x10 Standing functional IR AROM 2x10 Standing ER with arm a side with YTB 2x10   04/05/2023 Reviewed pain levels, current function, HEP compliance, and response to prior Rx. Pt received L shoulder PROM in flexion, abd, ER, and IR in supine per pt and tissue tolerance.     Pulleys in flex, scaption, abd x20 each Supine serratus punch 2#  3x10 Supine shoulder flexion 1# 2x10 S/L shoulder abduction 2x10 S/L ER 2x10 Shelf reach x10 to 2nd shelf Standing scaption 2x10 Standing shoulder  flexion 1# 1 x 8 Standing shoulder extension with retraction with GTB 2x10 Standing ER with arm a side with YTB 2x10  03/31/2023  L shoulder ROM: AROM in standing:   Flexion:  128 with UT compensatory hike   Scaption:  100 deg with UT compensatory hike  AROM in supine:   Flexion:  147   ER:  60   IR:  54  PROM:    Flex: 158  FOTO:  Prior/Current:  55 / 63.  Goal of 57.  Pulleys in flex, scaption, and abd approx 20 each Supine flexion with 1# 2x10 S/L shoulder abduction 2x10 Supine serratus punch with 2#  3x10 with assistance and cuing for correct form Standing scaption 2x10 S/L ER with cuing for correct form  2x10 Prone hz abd 2x10 Shelf reach x10 to 2nd shelf  Reviewed HEP and answered questions educating pt with appropriate frequency.    03/18/2023 Pulleys in flex and scaption approx 20 each Standing rows with GTB 2x15 Standing shoulder extension to neutral x 15 and x 10 Standing Jobe's flexion 1# x 10 Standing shoulder flexion with RTB in hands 2x10 Supine serratus punch 2x10 with 2# S/L ER with assistance and cuing for correct form  2x10 Supine ER with YTB x 10 reps Shelf reach (to 2nd shelf)  2x10  Pt received L shoulder PROM in flexion, abd, and ER in supine per pt and tissue tolerance  03/15/23 Pulley flexion/scaption 3 minutes Manual: grade II-III inferior glides in progressive flexion Supine shoulder horizontal abduction GTB 2 x 10 Supine shoulder bilateral ER GTB 3 x 10 Shoulder PNF D2  RTB 3 x 10  Standing shoulder flexion with band in hands RTB 3 x 10 Standing shoulder abduction with perpendicular resistance YTB 2 x 10  Standing shoulder flexion with perpendicular resistance YTB 2 x 10  Standing shoulder flexion/press with manual assist 1# 2 x 10 Review of prior prone exercises- adding 2# weight Prone  horizontal abduction 2 x 10  03/10/23 Pulley flexion/scaption 3 minutes Supine shoulder flexion supine 3# 2 x 10 Supine shoulder horizontal abduction RTB 2 x 10 Supine shoulder bilateral ER RTB 2 x 10 Shoulder PNF D2 YTB 1x 10,  RTB 1 x 10  Standing Row GTB 1x15 Standing shoulder extension GTB 1 x 15 Standing shoulder flexion 1# 1 x 10   03/08/23 Pulley flexion/scaption 3 minutes PROM flexion, manual: flexion with inferior glides grade II-III Supine shoulder flexion supine 2 x 10, 45 degree incline 2# bar 3 x 10 Shoulder flexion AROM - cabinet reaches 2 x 10  Shoulder flexion to first shelf in cabinet 1# 2 x 10 Sidelying shoulder ER 3 x 10 Supine shoulder bilateral ER YTB 2 x 10 Supine shoulder horizontal abduction RTB 2 x 10     PATIENT EDUCATION: Education details: surgical and protocol limitations and restrictions, Dx, relevant anatomy, POC, HEP, and exercise form. Person educated: Patient Education method: Explanation, Demonstration, Tactile cues, Verbal cues Education comprehension: verbalized understanding, returned demonstration, verbal cues required, tactile cues required, and needs further education   HOME EXERCISE PROGRAM: Access Code: [email or web address] URL: [email or web address] Date: 02/12/2023 Prepared by: Prentice Zaunegger  Exercises - Supine Shoulder Flexion Extension AAROM with Dowel  - 3 x daily - 7 x weekly - 2 sets - 10 reps - Supine Shoulder External Rotation with Dowel  - 3 x daily - 7 x weekly - 2 sets - 10 reps - Standing 'L' Stretch at Counter  - 3 x daily - 7  x weekly - 2 sets - 10 reps - 5-10 second hold - Supine Single Arm Scapular Protraction  - 1 x daily - 7 x weekly - 3 sets - 10 reps - Sidelying Shoulder Abduction Palm Forward (Mirrored)  - 1 x daily - 7 x weekly - 3 sets - 10 reps - Isometric Shoulder Flexion at Wall (Mirrored)  - 2 x daily - 7 x weekly - 5 reps - 10 second hold - Isometric Shoulder Abduction at Wall  - 5 x daily - 7 x  weekly - 5 reps - 10 second hold - Isometric Shoulder Extension at Wall  - 2 x daily - 7 x weekly - 5 reps - 10 second hold - Standing Isometric Shoulder Internal Rotation at Doorway  - 2 x daily - 7 x weekly - 5 reps - 10 second hold - Isometric Shoulder External Rotation at Wall  - 2 x daily - 7 x weekly - 5 reps - 10 second hold - Supine Single Arm Shoulder Protraction  - 1 x daily - 7 x weekly - 3 sets - 10 reps - Prone Shoulder Extension - Single Arm (Mirrored)  - 1 x daily - 7 x weekly - 3 sets - 10 reps - Shoulder Flexion Wall Slide with Towel  - 1 x daily - 7 x weekly - 10 reps - Supine Shoulder Flexion AROM  - 2 x daily - 7 x weekly - 2 sets - 10 reps - Standing Shoulder Flexion Wall Walk  - 1-2 x daily - 7 x weekly - 1 sets - 5 reps - Standing Shoulder Row with Anchored Resistance  - 1 x daily - 7 x weekly - 3 sets - 10 reps - Supine Shoulder External Rotation with Resistance  - 1 x daily - 7 x weekly - 2 sets - 10 reps - Shoulder extension with resistance - Neutral  - 1 x daily - 7 x weekly - 2 sets - 10 reps  03/01/23- Shoulder Alphabet with Ball at [facility]  - 1 x daily - 7 x weekly - 2-3 reps  03/15/23- Prone Single Arm Shoulder Horizontal Abduction with Scapular Retraction and Palm Down  - 1 x daily - 7 x weekly - 3 sets - 10 reps  ASSESSMENT:   CLINICAL IMPRESSION: Pt is improving with function as evidenced by subjective reports.  Pt is improving with shoulder strength as evidenced by performance of exercises.  PT increased resistance with supine flexion and pt performed well.  Pt is improving with form and ease with S/L ER.  Pt able to perform standing ER with YTB and requires cuing for correct form.  She also requires cuing to not flex elbow and for appropriate angle with standing scaption AROM.  Pt is limited with reaching behind back and PT had pt perform standing functional IR AROM.  Pt tolerated shoulder PROM well.  She responded well to Rx having no increased pain after Rx.  Pt  should continue to benefit from cont skilled PT per protocol to address ongoing goals, improve ROM and strength, and to assist in restoring desired level of function.      OBJECTIVE IMPAIRMENTS: decreased activity tolerance, decreased endurance, decreased ROM, decreased strength, hypomobility, impaired flexibility, impaired UE functional use, and pain.    ACTIVITY LIMITATIONS: carrying, lifting, bathing, dressing, reach over head, and hygiene/grooming   PARTICIPATION LIMITATIONS: meal prep, cleaning, laundry, driving, shopping, and community activity   PERSONAL FACTORS: 3+ comorbidities: cervical spondylosis, osteoporosis, R shoulder  pain  are also affecting patient's functional outcome.    REHAB POTENTIAL: Good   CLINICAL DECISION MAKING: Stable/uncomplicated   EVALUATION COMPLEXITY: Low     GOALS:   SHORT TERM GOALS:    Pt will be independent and compliant with HEP for improved pain, ROM, strength, and function  Baseline: Goal status: GOAL MET  Target date:  01/24/2023     2.  Pt will demo improved PROM to 110 deg in flexion and 30 deg in ER for improved shoulder mobility and tightness Baseline:  Goal status: GOAL MET  11/19 Target date:  01/17/2023       3.  Pt will demo L shoulder AAROM to 130 deg in flexion (supine) and 30 deg in ER for improved stiffness and mobility Baseline:  Goal status: GOAL MET  11/19 Target date:  01/31/2023     4.  Pt will be able to actively perform R shoulder flexion AROM to at least 90 deg in supine.  Baseline:  Goal status: GOAL MET  11/19 Target date:  01/24/2023     5.  Pt will be able to actively elevate R shoulder > 90 deg without significant shoulder hike.  Baseline:  Goal status:  PROGRESSING  1/4 Target date:  02/28/2023     6.  Pt will be able to perform her self care activities with no > than minimal difficulty.   Baseline:  Goal status:  GOAL MET  1/4 Target date:  03/21/2023     LONG TERM GOALS: Target date:    05/07/2023     Pt will be able to perform her ADLs and IADLs without significant difficulty and pain.  Baseline:  Goal status: PROGRESSING  1/4   2.   Pt will demo L shoulder AROM to be [facility] t/o for performance of ADLs and IADLs  Baseline:  Goal status: PROGRESSING  1/4   3.  Pt will be able to perform her normal reaching and functional overhead activities without significant limitation and pain. Baseline:  Goal status:  OINGOING  1/4   4.  Pt will be able to reach into an overhead cabinet without significant difficulty.  Baseline:  Goal status:  GOAL MET 1/4 Target date:  04/04/2023     5.  Pt will progress with strengthening exercises per protocol without adverse effects in order to perform her normal functional lifting/carrying activities and household chores.  Baseline:  Goal status: PROGRESSING   1/4       PLAN:   PT FREQUENCY: 2x/week   PT DURATION: 5 weeks   PLANNED INTERVENTIONS: Therapeutic exercises, Therapeutic activity, Neuromuscular re-education, Patient/Family education, Self Care, Aquatic Therapy, Dry Needling, Electrical stimulation, Cryotherapy, Moist heat, Taping, Manual therapy, and Re-evaluation   PLAN FOR NEXT SESSION: Cont per Dr. Saundra RSA protocol.    Leigh Minerva III PT, DPT 04/12/23 1:54 PM

## 2023-04-14 ENCOUNTER — Ambulatory Visit (HOSPITAL_BASED_OUTPATIENT_CLINIC_OR_DEPARTMENT_OTHER): Payer: Medicare HMO | Admitting: Physical Therapy

## 2023-04-14 ENCOUNTER — Encounter (HOSPITAL_BASED_OUTPATIENT_CLINIC_OR_DEPARTMENT_OTHER): Payer: Self-pay | Admitting: Physical Therapy

## 2023-04-14 DIAGNOSIS — M25612 Stiffness of left shoulder, not elsewhere classified: Secondary | ICD-10-CM

## 2023-04-14 DIAGNOSIS — M25512 Pain in left shoulder: Secondary | ICD-10-CM

## 2023-04-14 DIAGNOSIS — M6281 Muscle weakness (generalized): Secondary | ICD-10-CM | POA: Diagnosis not present

## 2023-04-14 DIAGNOSIS — M419 Scoliosis, unspecified: Secondary | ICD-10-CM | POA: Diagnosis not present

## 2023-04-14 NOTE — Therapy (Signed)
OUTPATIENT PHYSICAL THERAPY TREATMENT        Patient Name: Heidi Shah MRN: [id number] DOB:25-Jun-1945, 78 y.o., female Today's Date: 04/14/2023  END OF SESSION:  PT End of Session - 04/14/23 1213     Visit Number 23    Number of Visits 30    Date for PT Re-Evaluation 05/07/23    Authorization Type [employer] MCR    PT Start Time 1154    PT Stop Time 1235    PT Time Calculation (min) 41 min    Activity Tolerance Patient tolerated treatment well    Behavior During Therapy WFL for tasks assessed/performed                  Past Medical History:  Diagnosis Date   Allergy    Anxiety    Arthritis    Borderline glaucoma    Cataract    surgery - Bilateral cataracts removed   Cervical spondylolysis    Chicken pox    Colon polyps    Depression    Diverticulitis    Diverticulosis    Esophageal spasm    Esophageal stricture    Family history of adverse reaction to anesthesia    difficult time waking mother up   GERD (gastroesophageal reflux disease)    Heart murmur    Hypertension    Osteoporosis    Traumatic complete tear of left rotator cuff    Past Surgical History:  Procedure Laterality Date   cataract surgery Bilateral 1996   COLONOSCOPY  2022   EYE SURGERY  2000   detached L retina   HERNIA REPAIR Bilateral 1990   inguinal   NECK SURGERY     age 57-trimmed a muscle due to not being able to hold her head up   POLYPECTOMY  2022   REVERSE SHOULDER ARTHROPLASTY Left 12/16/2022   Procedure: LEFT REVERSE SHOULDER ARTHROPLASTY;  Surgeon: Huel Cote, MD;  Location: [facility];  Service: Orthopedics;  Laterality: Left;   SHOULDER ARTHROSCOPY WITH ROTATOR CUFF REPAIR AND OPEN BICEPS TENODESIS Left 12/10/2021   Procedure: LEFT SHOULDER ARTHROSCOPY WITH ROTATOR CUFF REPAIR AND OPEN BICEPS TENODESIS;  Surgeon: Huel Cote, MD;  Location: [facility];  Service: Orthopedics;  Laterality: Left;   UPPER GASTROINTESTINAL ENDOSCOPY  2022   Patient Active Problem  List   Diagnosis Date Noted   Traumatic complete tear of left rotator cuff    Cervical spondylosis with myelopathy and radiculopathy 03/14/2020   Neck pain 03/14/2020   Aortic heart murmur 02/25/2020   Age-related osteoporosis without current pathological fracture 04/04/2019   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   Constipation 03/05/2013   GERD (gastroesophageal reflux disease) 01/19/2013   Esophageal spasm 01/19/2013   Dysphagia 01/19/2013   Diverticulitis of colon (without mention of hemorrhage)(562.11) 11/23/2012   History of depression 11/23/2012   Essential hypertension, benign 11/23/2012     THERAPY DIAG:  Left shoulder pain, unspecified chronicity  Muscle weakness (generalized)  Stiffness of left shoulder, not elsewhere classified  REFERRING PROVIDER: Huel Cote, MD   REFERRING DIAG: S46.012A (ICD-10-CM) - Traumatic complete tear of left rotator cuff, initial encounter    S/p L RSA   THERAPY DIAG:  Left shoulder pain, unspecified chronicity   Stiffness of left shoulder, not elsewhere classified   Muscle weakness (generalized)   Rationale for Evaluation and Treatment: Rehabilitation   ONSET DATE: DOS 12/16/2022   SUBJECTIVE:  SUBJECTIVE STATEMENT: Pt is 17 weeks s/p L RSA.  Pt denies pain currently.   RESPONSE TO PRIOR RX:  Pt denies any adverse effects after prior Rx. HEP COMPLIANCE:  Pt reports compliance with HEP.   FUNCTIONAL IMPROVEMENTS:  Pt states she is reaching better including into the overhead cabinet.  She is able to grab a plate or cup out of the cabinet.  Washing/drying hair.  Dressing.  Pt reports she is able to do things at home easier.  Using her UE more without thinking about it.   FUNCTIONAL LIMITATIONS:  overhead activities though is better.  Lifting a gallon of tea  onto the shelf in the fridge.  Form with reaching overhead.  Reaching behind her back.  Reaching across body.     Hand dominance: Right   PERTINENT HISTORY: -L shoulder reverse shoulder arthroplasty (RSA) and biceps tenodesis on 12/16/2022.  -L shoulder RCR and biceps tenodesis in 11/2021. -Arthritis, cervical spondylosis, osteoporosis, HTN -Pt states she does have 2 small tears in her R shoulder and does some pain in R shoulder.     PAIN:  NPRS:  0/10 current, 2-3/10 worst at night, 0/10 best Pt denies soreness.    PRECAUTIONS: Other: Tears and pain in R shoulder, osteoporosis       WEIGHT BEARING RESTRICTIONS: Yes L UE   FALLS:  Has patient fallen in last 6 months? No   LIVING ENVIRONMENT: Lives with: lives alone Lives in: 1 story home Stairs: has steps to enter home and bilat rails on one entrance     OCCUPATION: Pt is retired.   PLOF: Independent   PATIENT GOALS:  to regain 100% movement in arm and use it as normal     OBJECTIVE:    DIAGNOSTIC FINDINGS:  Pt is post op.      TODAY'S TREATMENT:                                                                                                                                          04/14/2023 Reviewed pain levels, current function, HEP compliance, and response to prior Rx. Pt received L shoulder PROM in flexion, abd, ER, and IR in supine per pt and tissue tolerance.    Pulleys in flex, scaption, abd x20 each Supine serratus punch 2# 3x10 Supine shoulder flexion 2# 2x10 S/L shoulder abduction x10 S/L ER 2x10 Standing jobe's flexion 1# x10 reps Standing scaption 2x10 Shelf reach 2x10 to 2nd shelf Standing functional IR AROM 2x10 Standing ER with arm at side with YTB 2x10 4D ball rolls on wall x10 each   04/12/2023 Reviewed pain levels, current function, HEP compliance, and response to prior Rx. Pt received L shoulder PROM in flexion, abd, ER, and IR in supine per pt and tissue tolerance.    Pulleys in flex,  scaption, abd x20 each Supine serratus punch 2# 3x10 Supine shoulder flexion 1# x  10, 2# 2x10 S/L shoulder abduction 2x10 S/L ER 2x10 Shelf reach 2x10 to 2nd shelf Standing scaption 2x10 Standing functional IR AROM 2x10 Standing ER with arm a side with YTB 2x10   04/05/2023 Reviewed pain levels, current function, HEP compliance, and response to prior Rx. Pt received L shoulder PROM in flexion, abd, ER, and IR in supine per pt and tissue tolerance.     Pulleys in flex, scaption, abd x20 each Supine serratus punch 2# 3x10 Supine shoulder flexion 1# 2x10 S/L shoulder abduction 2x10 S/L ER 2x10 Shelf reach x10 to 2nd shelf Standing scaption 2x10 Standing shoulder flexion 1# 1 x 8 Standing shoulder extension with retraction with GTB 2x10 Standing ER with arm a side with YTB 2x10  03/31/2023  L shoulder ROM: AROM in standing:   Flexion:  128 with UT compensatory hike   Scaption:  100 deg with UT compensatory hike  AROM in supine:   Flexion:  147   ER:  60   IR:  54  PROM:    Flex: 158  FOTO:  Prior/Current:  55 / 63.  Goal of 57.  Pulleys in flex, scaption, and abd approx 20 each Supine flexion with 1# 2x10 S/L shoulder abduction 2x10 Supine serratus punch with 2#  3x10 with assistance and cuing for correct form Standing scaption 2x10 S/L ER with cuing for correct form  2x10 Prone hz abd 2x10 Shelf reach x10 to 2nd shelf  Reviewed HEP and answered questions educating pt with appropriate frequency.    03/18/2023 Pulleys in flex and scaption approx 20 each Standing rows with GTB 2x15 Standing shoulder extension to neutral x 15 and x 10 Standing Jobe's flexion 1# x 10 Standing shoulder flexion with RTB in hands 2x10 Supine serratus punch 2x10 with 2# S/L ER with assistance and cuing for correct form  2x10 Supine ER with YTB x 10 reps Shelf reach (to 2nd shelf)  2x10  Pt received L shoulder PROM in flexion, abd, and ER in supine per pt and tissue  tolerance  03/15/23 Pulley flexion/scaption 3 minutes Manual: grade II-III inferior glides in progressive flexion Supine shoulder horizontal abduction GTB 2 x 10 Supine shoulder bilateral ER GTB 3 x 10 Shoulder PNF D2  RTB 3 x 10  Standing shoulder flexion with band in hands RTB 3 x 10 Standing shoulder abduction with perpendicular resistance YTB 2 x 10  Standing shoulder flexion with perpendicular resistance YTB 2 x 10  Standing shoulder flexion/press with manual assist 1# 2 x 10 Review of prior prone exercises- adding 2# weight Prone horizontal abduction 2 x 10    PATIENT EDUCATION: Education details: surgical and protocol limitations and restrictions, Dx, relevant anatomy, POC, HEP, and exercise form. Person educated: Patient Education method: Explanation, Demonstration, Tactile cues, Verbal cues Education comprehension: verbalized understanding, returned demonstration, verbal cues required, tactile cues required, and needs further education   HOME EXERCISE PROGRAM: Access Code: [email or web address] URL: [email or web address] Date: 02/12/2023 Prepared by: Greig Castilla Zaunegger  Exercises - Supine Shoulder Flexion Extension AAROM with Dowel  - 3 x daily - 7 x weekly - 2 sets - 10 reps - Supine Shoulder External Rotation with Dowel  - 3 x daily - 7 x weekly - 2 sets - 10 reps - Standing 'L' Stretch at Counter  - 3 x daily - 7 x weekly - 2 sets - 10 reps - 5-10 second hold - Supine Single Arm Scapular Protraction  - 1 x daily - 7 x weekly - 3 sets -  10 reps - Sidelying Shoulder Abduction Palm Forward (Mirrored)  - 1 x daily - 7 x weekly - 3 sets - 10 reps - Isometric Shoulder Flexion at Wall (Mirrored)  - 2 x daily - 7 x weekly - 5 reps - 10 second hold - Isometric Shoulder Abduction at Wall  - 5 x daily - 7 x weekly - 5 reps - 10 second hold - Isometric Shoulder Extension at Wall  - 2 x daily - 7 x weekly - 5 reps - 10 second hold - Standing Isometric Shoulder Internal Rotation at  Doorway  - 2 x daily - 7 x weekly - 5 reps - 10 second hold - Isometric Shoulder External Rotation at Wall  - 2 x daily - 7 x weekly - 5 reps - 10 second hold - Supine Single Arm Shoulder Protraction  - 1 x daily - 7 x weekly - 3 sets - 10 reps - Prone Shoulder Extension - Single Arm (Mirrored)  - 1 x daily - 7 x weekly - 3 sets - 10 reps - Shoulder Flexion Wall Slide with Towel  - 1 x daily - 7 x weekly - 10 reps - Supine Shoulder Flexion AROM  - 2 x daily - 7 x weekly - 2 sets - 10 reps - Standing Shoulder Flexion Wall Walk  - 1-2 x daily - 7 x weekly - 1 sets - 5 reps - Standing Shoulder Row with Anchored Resistance  - 1 x daily - 7 x weekly - 3 sets - 10 reps - Supine Shoulder External Rotation with Resistance  - 1 x daily - 7 x weekly - 2 sets - 10 reps - Shoulder extension with resistance - Neutral  - 1 x daily - 7 x weekly - 2 sets - 10 reps  03/01/23- Shoulder Alphabet with Ball at [facility]  - 1 x daily - 7 x weekly - 2-3 reps  03/15/23- Prone Single Arm Shoulder Horizontal Abduction with Scapular Retraction and Palm Down  - 1 x daily - 7 x weekly - 3 sets - 10 reps  ASSESSMENT:   CLINICAL IMPRESSION: Pt is improving with function as evidenced by subjective reports.  Pt demonstrates improved performance of ER in S/L'ing and standing with theraband.  She has weakness in shoulder flexion though is making progress.  Pt able to perform supine flexion with 2# weight well.  She was able to perform 1 set of 10 with 1# in standing though fatigued toward the end of the set and requires cuing for correct form.  Pt also fatigued toward the end of the set with shelf reaches having increased compensatory shoulder hike.  Pt is limited with reaching behind back and PT had pt perform standing functional IR AROM.  Pt tolerated shoulder PROM well and performed exercises per protocol well.  She responded well to Rx having no increased pain after Rx.  Pt should continue to benefit from cont skilled PT per protocol  to address ongoing goals, improve ROM and strength, and to assist in restoring desired level of function.      OBJECTIVE IMPAIRMENTS: decreased activity tolerance, decreased endurance, decreased ROM, decreased strength, hypomobility, impaired flexibility, impaired UE functional use, and pain.    ACTIVITY LIMITATIONS: carrying, lifting, bathing, dressing, reach over head, and hygiene/grooming   PARTICIPATION LIMITATIONS: meal prep, cleaning, laundry, driving, shopping, and community activity   PERSONAL FACTORS: 3+ comorbidities: cervical spondylosis, osteoporosis, R shoulder pain  are also affecting patient's functional outcome.  REHAB POTENTIAL: Good   CLINICAL DECISION MAKING: Stable/uncomplicated   EVALUATION COMPLEXITY: Low     GOALS:   SHORT TERM GOALS:    Pt will be independent and compliant with HEP for improved pain, ROM, strength, and function  Baseline: Goal status: GOAL MET  Target date:  01/24/2023     2.  Pt will demo improved PROM to 110 deg in flexion and 30 deg in ER for improved shoulder mobility and tightness Baseline:  Goal status: GOAL MET  11/19 Target date:  01/17/2023       3.  Pt will demo L shoulder AAROM to 130 deg in flexion (supine) and 30 deg in ER for improved stiffness and mobility Baseline:  Goal status: GOAL MET  11/19 Target date:  01/31/2023     4.  Pt will be able to actively perform R shoulder flexion AROM to at least 90 deg in supine.  Baseline:  Goal status: GOAL MET  11/19 Target date:  01/24/2023     5.  Pt will be able to actively elevate R shoulder > 90 deg without significant shoulder hike.  Baseline:  Goal status:  PROGRESSING  1/4 Target date:  02/28/2023     6.  Pt will be able to perform her self care activities with no > than minimal difficulty.   Baseline:  Goal status:  GOAL MET  1/4 Target date:  03/21/2023     LONG TERM GOALS: Target date:   05/07/2023     Pt will be able to perform her ADLs and IADLs  without significant difficulty and pain.  Baseline:  Goal status: PROGRESSING  1/4   2.   Pt will demo L shoulder AROM to be [facility] t/o for performance of ADLs and IADLs  Baseline:  Goal status: PROGRESSING  1/4   3.  Pt will be able to perform her normal reaching and functional overhead activities without significant limitation and pain. Baseline:  Goal status:  OINGOING  1/4   4.  Pt will be able to reach into an overhead cabinet without significant difficulty.  Baseline:  Goal status:  GOAL MET 1/4 Target date:  04/04/2023     5.  Pt will progress with strengthening exercises per protocol without adverse effects in order to perform her normal functional lifting/carrying activities and household chores.  Baseline:  Goal status: PROGRESSING   1/4       PLAN:   PT FREQUENCY: 2x/week   PT DURATION: 5 weeks   PLANNED INTERVENTIONS: Therapeutic exercises, Therapeutic activity, Neuromuscular re-education, Patient/Family education, Self Care, Aquatic Therapy, Dry Needling, Electrical stimulation, Cryotherapy, Moist heat, Taping, Manual therapy, and Re-evaluation   PLAN FOR NEXT SESSION: Cont per Dr. Suzan Nailer RSA protocol.    Audie Clear III PT, DPT 04/14/23 5:47 PM

## 2023-04-16 ENCOUNTER — Ambulatory Visit
Admission: RE | Admit: 2023-04-16 | Discharge: 2023-04-16 | Disposition: A | Discharge status: Home / Self Care | Payer: Medicare HMO | Source: Ambulatory Visit | Attending: Orthopaedic Surgery | Admitting: Orthopaedic Surgery

## 2023-04-16 DIAGNOSIS — M67813 Other specified disorders of tendon, right shoulder: Secondary | ICD-10-CM | POA: Diagnosis not present

## 2023-04-16 DIAGNOSIS — M75111 Incomplete rotator cuff tear or rupture of right shoulder, not specified as traumatic: Secondary | ICD-10-CM | POA: Diagnosis not present

## 2023-04-16 DIAGNOSIS — M7541 Impingement syndrome of right shoulder: Secondary | ICD-10-CM

## 2023-04-19 ENCOUNTER — Ambulatory Visit (HOSPITAL_BASED_OUTPATIENT_CLINIC_OR_DEPARTMENT_OTHER): Payer: Medicare HMO | Admitting: Physical Therapy

## 2023-04-19 ENCOUNTER — Encounter (HOSPITAL_BASED_OUTPATIENT_CLINIC_OR_DEPARTMENT_OTHER): Payer: Self-pay | Admitting: Physical Therapy

## 2023-04-19 DIAGNOSIS — M25612 Stiffness of left shoulder, not elsewhere classified: Secondary | ICD-10-CM | POA: Diagnosis not present

## 2023-04-19 DIAGNOSIS — M419 Scoliosis, unspecified: Secondary | ICD-10-CM | POA: Diagnosis not present

## 2023-04-19 DIAGNOSIS — M25512 Pain in left shoulder: Secondary | ICD-10-CM | POA: Diagnosis not present

## 2023-04-19 DIAGNOSIS — M6281 Muscle weakness (generalized): Secondary | ICD-10-CM | POA: Diagnosis not present

## 2023-04-19 NOTE — Therapy (Signed)
OUTPATIENT PHYSICAL THERAPY TREATMENT        Patient Name: Heidi Shah MRN: [id number] DOB:June 02, 1945, 78 y.o., female Today's Date: 04/19/2023  END OF SESSION:  PT End of Session - 04/19/23 0806     Visit Number 24    Number of Visits 30    Date for PT Re-Evaluation 05/07/23    Authorization Type human[employer] MCR    PT Start Time 0806    PT Stop Time 0845    PT Time Calculation (min) 39 min    Activity Tolerance Patient tolerated treatment well    Behavior During Therapy WFL for tasks assessed/performed                  Past Medical History:  Diagnosis Date   Allergy    Anxiety    Arthritis    Borderline glaucoma    Cataract    surgery - Bilateral cataracts removed   Cervical spondylolysis    Chicken pox    Colon polyps    Depression    Diverticulitis    Diverticulosis    Esophageal spasm    Esophageal stricture    Family history of adverse reaction to anesthesia    difficult time waking mother up   GERD (gastroesophageal reflux disease)    Heart murmur    Hypertension    Osteoporosis    Traumatic complete tear of left rotator cuff    Past Surgical History:  Procedure Laterality Date   cataract surgery Bilateral 1996   COLONOSCOPY  2022   EYE SURGERY  2000   detached L retina   HERNIA REPAIR Bilateral 1990   inguinal   NECK SURGERY     age 51-trimmed a muscle due to not being able to hold her head up   POLYPECTOMY  2022   REVERSE SHOULDER ARTHROPLASTY Left 12/16/2022   Procedure: LEFT REVERSE SHOULDER ARTHROPLASTY;  Surgeon: Huel Cote, MD;  Location: [facility];  Service: Orthopedics;  Laterality: Left;   SHOULDER ARTHROSCOPY WITH ROTATOR CUFF REPAIR AND OPEN BICEPS TENODESIS Left 12/10/2021   Procedure: LEFT SHOULDER ARTHROSCOPY WITH ROTATOR CUFF REPAIR AND OPEN BICEPS TENODESIS;  Surgeon: Huel Cote, MD;  Location: [facility];  Service: Orthopedics;  Laterality: Left;   UPPER GASTROINTESTINAL ENDOSCOPY  2022   Patient Active Problem  List   Diagnosis Date Noted   Traumatic complete tear of left rotator cuff    Cervical spondylosis with myelopathy and radiculopathy 03/14/2020   Neck pain 03/14/2020   Aortic heart murmur 02/25/2020   Age-related osteoporosis without current pathological fracture 04/04/2019   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   Constipation 03/05/2013   GERD (gastroesophageal reflux disease) 01/19/2013   Esophageal spasm 01/19/2013   Dysphagia 01/19/2013   Diverticulitis of colon (without mention of hemorrhage)(562.11) 11/23/2012   History of depression 11/23/2012   Essential hypertension, benign 11/23/2012     THERAPY DIAG:  Left shoulder pain, unspecified chronicity  Muscle weakness (generalized)  Stiffness of left shoulder, not elsewhere classified  REFERRING PROVIDER: Huel Cote, MD   REFERRING DIAG: S46.012A (ICD-10-CM) - Traumatic complete tear of left rotator cuff, initial encounter    S/p L RSA   THERAPY DIAG:  Left shoulder pain, unspecified chronicity   Stiffness of left shoulder, not elsewhere classified   Muscle weakness (generalized)   Rationale for Evaluation and Treatment: Rehabilitation   ONSET DATE: DOS 12/16/2022   SUBJECTIVE:  SUBJECTIVE STATEMENT: Pt is 17 weeks s/p L RSA.  Patient states pain with quick movements. Reaching back is still giving her trouble.  RESPONSE TO PRIOR RX:  Pt denies any adverse effects after prior Rx. HEP COMPLIANCE:  Pt reports compliance with HEP.   FUNCTIONAL IMPROVEMENTS:  Pt states she is reaching better including into the overhead cabinet.  She is able to grab a plate or cup out of the cabinet.  Washing/drying hair.  Dressing.  Pt reports she is able to do things at home easier.  Using her UE more without thinking about it.   FUNCTIONAL LIMITATIONS:   overhead activities though is better.  Lifting a gallon of tea onto the shelf in the fridge.  Form with reaching overhead.  Reaching behind her back.  Reaching across body.     Hand dominance: Right   PERTINENT HISTORY: -L shoulder reverse shoulder arthroplasty (RSA) and biceps tenodesis on 12/16/2022.  -L shoulder RCR and biceps tenodesis in 11/2021. -Arthritis, cervical spondylosis, osteoporosis, HTN -Pt states she does have 2 small tears in her R shoulder and does some pain in R shoulder.     PAIN:  NPRS:  0/10 current, 2-3/10 worst at night, 0/10 best Pt denies soreness.    PRECAUTIONS: Other: Tears and pain in R shoulder, osteoporosis       WEIGHT BEARING RESTRICTIONS: Yes L UE   FALLS:  Has patient fallen in last 6 months? No   LIVING ENVIRONMENT: Lives with: lives alone Lives in: 1 story home Stairs: has steps to enter home and bilat rails on one entrance     OCCUPATION: Pt is retired.   PLOF: Independent   PATIENT GOALS:  to regain 100% movement in arm and use it as normal     OBJECTIVE:    DIAGNOSTIC FINDINGS:  Pt is post op.      TODAY'S TREATMENT:                                                                                                                                         04/19/23 Pulley  in flex, scaption, abd x20 each Manual: Grade II-III  GH inferior glides  in flexion; sidelying scapular mobs; prone IR mob grade II-III Cross body stretch 3 x 20 second holds Supine shoulder PNF D2 GTB 3 x 10  Seated shoulder ER at 90 with arm supported 2# 3 x 10   04/14/2023 Reviewed pain levels, current function, HEP compliance, and response to prior Rx. Pt received L shoulder PROM in flexion, abd, ER, and IR in supine per pt and tissue tolerance.    Pulleys in flex, scaption, abd x20 each Supine serratus punch 2# 3x10 Supine shoulder flexion 2# 2x10 S/L shoulder abduction x10 S/L ER 2x10 Standing jobe's flexion 1# x10 reps Standing scaption  2x10 Shelf reach 2x10 to 2nd shelf Standing functional IR AROM 2x10 Standing ER with arm  at side with YTB 2x10 4D ball rolls on wall x10 each   04/12/2023 Reviewed pain levels, current function, HEP compliance, and response to prior Rx. Pt received L shoulder PROM in flexion, abd, ER, and IR in supine per pt and tissue tolerance.    Pulleys in flex, scaption, abd x20 each Supine serratus punch 2# 3x10 Supine shoulder flexion 1# x 10, 2# 2x10 S/L shoulder abduction 2x10 S/L ER 2x10 Shelf reach 2x10 to 2nd shelf Standing scaption 2x10 Standing functional IR AROM 2x10 Standing ER with arm a side with YTB 2x10   04/05/2023 Reviewed pain levels, current function, HEP compliance, and response to prior Rx. Pt received L shoulder PROM in flexion, abd, ER, and IR in supine per pt and tissue tolerance.     Pulleys in flex, scaption, abd x20 each Supine serratus punch 2# 3x10 Supine shoulder flexion 1# 2x10 S/L shoulder abduction 2x10 S/L ER 2x10 Shelf reach x10 to 2nd shelf Standing scaption 2x10 Standing shoulder flexion 1# 1 x 8 Standing shoulder extension with retraction with GTB 2x10 Standing ER with arm a side with YTB 2x10  03/31/2023  L shoulder ROM: AROM in standing:   Flexion:  128 with UT compensatory hike   Scaption:  100 deg with UT compensatory hike  AROM in supine:   Flexion:  147   ER:  60   IR:  54  PROM:    Flex: 158  FOTO:  Prior/Current:  55 / 63.  Goal of 57.  Pulleys in flex, scaption, and abd approx 20 each Supine flexion with 1# 2x10 S/L shoulder abduction 2x10 Supine serratus punch with 2#  3x10 with assistance and cuing for correct form Standing scaption 2x10 S/L ER with cuing for correct form  2x10 Prone hz abd 2x10 Shelf reach x10 to 2nd shelf  Reviewed HEP and answered questions educating pt with appropriate frequency.    03/18/2023 Pulleys in flex and scaption approx 20 each Standing rows with GTB 2x15 Standing shoulder extension to  neutral x 15 and x 10 Standing Jobe's flexion 1# x 10 Standing shoulder flexion with RTB in hands 2x10 Supine serratus punch 2x10 with 2# S/L ER with assistance and cuing for correct form  2x10 Supine ER with YTB x 10 reps Shelf reach (to 2nd shelf)  2x10  Pt received L shoulder PROM in flexion, abd, and ER in supine per pt and tissue tolerance  03/15/23 Pulley flexion/scaption 3 minutes Manual: grade II-III inferior glides in progressive flexion Supine shoulder horizontal abduction GTB 2 x 10 Supine shoulder bilateral ER GTB 3 x 10 Shoulder PNF D2  RTB 3 x 10  Standing shoulder flexion with band in hands RTB 3 x 10 Standing shoulder abduction with perpendicular resistance YTB 2 x 10  Standing shoulder flexion with perpendicular resistance YTB 2 x 10  Standing shoulder flexion/press with manual assist 1# 2 x 10 Review of prior prone exercises- adding 2# weight Prone horizontal abduction 2 x 10    PATIENT EDUCATION: Education details: surgical and protocol limitations and restrictions, Dx, relevant anatomy, POC, HEP, and exercise form. Person educated: Patient Education method: Explanation, Demonstration, Tactile cues, Verbal cues Education comprehension: verbalized understanding, returned demonstration, verbal cues required, tactile cues required, and needs further education   HOME EXERCISE PROGRAM: Access Code: [email or web address] URL: [email or web address] Date: 02/12/2023 Prepared by: Greig Castilla Shelma Eiben  Exercises - Supine Shoulder Flexion Extension AAROM with Dowel  - 3 x daily - 7 x weekly - 2 sets - 10 reps -  Supine Shoulder External Rotation with Dowel  - 3 x daily - 7 x weekly - 2 sets - 10 reps - Standing 'L' Stretch at Counter  - 3 x daily - 7 x weekly - 2 sets - 10 reps - 5-10 second hold - Supine Single Arm Scapular Protraction  - 1 x daily - 7 x weekly - 3 sets - 10 reps - Sidelying Shoulder Abduction Palm Forward (Mirrored)  - 1 x daily - 7 x weekly - 3 sets -  10 reps - Isometric Shoulder Flexion at Wall (Mirrored)  - 2 x daily - 7 x weekly - 5 reps - 10 second hold - Isometric Shoulder Abduction at Wall  - 5 x daily - 7 x weekly - 5 reps - 10 second hold - Isometric Shoulder Extension at Wall  - 2 x daily - 7 x weekly - 5 reps - 10 second hold - Standing Isometric Shoulder Internal Rotation at Doorway  - 2 x daily - 7 x weekly - 5 reps - 10 second hold - Isometric Shoulder External Rotation at Wall  - 2 x daily - 7 x weekly - 5 reps - 10 second hold - Supine Single Arm Shoulder Protraction  - 1 x daily - 7 x weekly - 3 sets - 10 reps - Prone Shoulder Extension - Single Arm (Mirrored)  - 1 x daily - 7 x weekly - 3 sets - 10 reps - Shoulder Flexion Wall Slide with Towel  - 1 x daily - 7 x weekly - 10 reps - Supine Shoulder Flexion AROM  - 2 x daily - 7 x weekly - 2 sets - 10 reps - Standing Shoulder Flexion Wall Walk  - 1-2 x daily - 7 x weekly - 1 sets - 5 reps - Standing Shoulder Row with Anchored Resistance  - 1 x daily - 7 x weekly - 3 sets - 10 reps - Supine Shoulder External Rotation with Resistance  - 1 x daily - 7 x weekly - 2 sets - 10 reps - Shoulder extension with resistance - Neutral  - 1 x daily - 7 x weekly - 2 sets - 10 reps  03/01/23- Shoulder Alphabet with Ball at [facility]  - 1 x daily - 7 x weekly - 2-3 reps  03/15/23- Prone Single Arm Shoulder Horizontal Abduction with Scapular Retraction and Palm Down  - 1 x daily - 7 x weekly - 3 sets - 10 reps 04/19/23 - Seated Shoulder External Rotation in Abduction Supported with Dumbbell (Mirrored)  - 1 x daily - 7 x weekly - 3 sets - 10 reps  ASSESSMENT:   CLINICAL IMPRESSION: Patient at 157 flexion AAROM. Patient goes into more of a scaption position rather than flexion with AROM flexion. Performed manual for IR ROM and scap mobility with slight improvement in flexion mechanics. Able to progress resistance with previously completed exercises. Patient will continue to benefit from physical therapy  in order to improve function and reduce impairment.      OBJECTIVE IMPAIRMENTS: decreased activity tolerance, decreased endurance, decreased ROM, decreased strength, hypomobility, impaired flexibility, impaired UE functional use, and pain.    ACTIVITY LIMITATIONS: carrying, lifting, bathing, dressing, reach over head, and hygiene/grooming   PARTICIPATION LIMITATIONS: meal prep, cleaning, laundry, driving, shopping, and community activity   PERSONAL FACTORS: 3+ comorbidities: cervical spondylosis, osteoporosis, R shoulder pain  are also affecting patient's functional outcome.    REHAB POTENTIAL: Good   CLINICAL DECISION MAKING: Stable/uncomplicated   EVALUATION  COMPLEXITY: Low     GOALS:   SHORT TERM GOALS:    Pt will be independent and compliant with HEP for improved pain, ROM, strength, and function  Baseline: Goal status: GOAL MET  Target date:  01/24/2023     2.  Pt will demo improved PROM to 110 deg in flexion and 30 deg in ER for improved shoulder mobility and tightness Baseline:  Goal status: GOAL MET  11/19 Target date:  01/17/2023       3.  Pt will demo L shoulder AAROM to 130 deg in flexion (supine) and 30 deg in ER for improved stiffness and mobility Baseline:  Goal status: GOAL MET  11/19 Target date:  01/31/2023     4.  Pt will be able to actively perform R shoulder flexion AROM to at least 90 deg in supine.  Baseline:  Goal status: GOAL MET  11/19 Target date:  01/24/2023     5.  Pt will be able to actively elevate R shoulder > 90 deg without significant shoulder hike.  Baseline:  Goal status:  PROGRESSING  1/4 Target date:  02/28/2023     6.  Pt will be able to perform her self care activities with no > than minimal difficulty.   Baseline:  Goal status:  GOAL MET  1/4 Target date:  03/21/2023     LONG TERM GOALS: Target date:   05/07/2023     Pt will be able to perform her ADLs and IADLs without significant difficulty and pain.  Baseline:   Goal status: PROGRESSING  1/4   2.   Pt will demo L shoulder AROM to be [facility] t/o for performance of ADLs and IADLs  Baseline:  Goal status: PROGRESSING  1/4   3.  Pt will be able to perform her normal reaching and functional overhead activities without significant limitation and pain. Baseline:  Goal status:  OINGOING  1/4   4.  Pt will be able to reach into an overhead cabinet without significant difficulty.  Baseline:  Goal status:  GOAL MET 1/4 Target date:  04/04/2023     5.  Pt will progress with strengthening exercises per protocol without adverse effects in order to perform her normal functional lifting/carrying activities and household chores.  Baseline:  Goal status: PROGRESSING   1/4       PLAN:   PT FREQUENCY: 2x/week   PT DURATION: 5 weeks   PLANNED INTERVENTIONS: Therapeutic exercises, Therapeutic activity, Neuromuscular re-education, Patient/Family education, Self Care, Aquatic Therapy, Dry Needling, Electrical stimulation, Cryotherapy, Moist heat, Taping, Manual therapy, and Re-evaluation   PLAN FOR NEXT SESSION: Cont per Dr. Suzan Nailer RSA protocol.     Reola Mosher Shenaya Lebo, PT 04/19/2023, 8:06 AM

## 2023-04-21 ENCOUNTER — Ambulatory Visit (HOSPITAL_BASED_OUTPATIENT_CLINIC_OR_DEPARTMENT_OTHER): Payer: Medicare HMO

## 2023-04-21 ENCOUNTER — Encounter (HOSPITAL_BASED_OUTPATIENT_CLINIC_OR_DEPARTMENT_OTHER): Payer: Self-pay

## 2023-04-26 ENCOUNTER — Ambulatory Visit (HOSPITAL_BASED_OUTPATIENT_CLINIC_OR_DEPARTMENT_OTHER): Payer: Medicare HMO | Admitting: Physical Therapy

## 2023-04-26 ENCOUNTER — Encounter (HOSPITAL_BASED_OUTPATIENT_CLINIC_OR_DEPARTMENT_OTHER): Payer: Self-pay | Admitting: Physical Therapy

## 2023-04-26 DIAGNOSIS — M419 Scoliosis, unspecified: Secondary | ICD-10-CM | POA: Diagnosis not present

## 2023-04-26 DIAGNOSIS — M25512 Pain in left shoulder: Secondary | ICD-10-CM | POA: Diagnosis not present

## 2023-04-26 DIAGNOSIS — M6281 Muscle weakness (generalized): Secondary | ICD-10-CM | POA: Diagnosis not present

## 2023-04-26 DIAGNOSIS — M25612 Stiffness of left shoulder, not elsewhere classified: Secondary | ICD-10-CM

## 2023-04-26 NOTE — Therapy (Signed)
OUTPATIENT PHYSICAL THERAPY TREATMENT        Patient Name: Heidi Shah MRN: [id number] DOB:1945-06-05, 78 y.o., female Today's Date: 04/26/2023  END OF SESSION:  PT End of Session - 04/26/23 0806     Visit Number 25    Number of Visits 30    Date for PT Re-Evaluation 05/07/23    Authorization Type human[employer] MCR    PT Start Time 0806    PT Stop Time 0845    PT Time Calculation (min) 39 min    Activity Tolerance Patient tolerated treatment well    Behavior During Therapy WFL for tasks assessed/performed                  Past Medical History:  Diagnosis Date   Allergy    Anxiety    Arthritis    Borderline glaucoma    Cataract    surgery - Bilateral cataracts removed   Cervical spondylolysis    Chicken pox    Colon polyps    Depression    Diverticulitis    Diverticulosis    Esophageal spasm    Esophageal stricture    Family history of adverse reaction to anesthesia    difficult time waking mother up   GERD (gastroesophageal reflux disease)    Heart murmur    Hypertension    Osteoporosis    Traumatic complete tear of left rotator cuff    Past Surgical History:  Procedure Laterality Date   cataract surgery Bilateral 1996   COLONOSCOPY  2022   EYE SURGERY  2000   detached L retina   HERNIA REPAIR Bilateral 1990   inguinal   NECK SURGERY     age 53-trimmed a muscle due to not being able to hold her head up   POLYPECTOMY  2022   REVERSE SHOULDER ARTHROPLASTY Left 12/16/2022   Procedure: LEFT REVERSE SHOULDER ARTHROPLASTY;  Surgeon: Huel Cote, MD;  Location: [facility];  Service: Orthopedics;  Laterality: Left;   SHOULDER ARTHROSCOPY WITH ROTATOR CUFF REPAIR AND OPEN BICEPS TENODESIS Left 12/10/2021   Procedure: LEFT SHOULDER ARTHROSCOPY WITH ROTATOR CUFF REPAIR AND OPEN BICEPS TENODESIS;  Surgeon: Huel Cote, MD;  Location: [facility];  Service: Orthopedics;  Laterality: Left;   UPPER GASTROINTESTINAL ENDOSCOPY  2022   Patient Active Problem  List   Diagnosis Date Noted   Traumatic complete tear of left rotator cuff    Cervical spondylosis with myelopathy and radiculopathy 03/14/2020   Neck pain 03/14/2020   Aortic heart murmur 02/25/2020   Age-related osteoporosis without current pathological fracture 04/04/2019   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   Constipation 03/05/2013   GERD (gastroesophageal reflux disease) 01/19/2013   Esophageal spasm 01/19/2013   Dysphagia 01/19/2013   Diverticulitis of colon (without mention of hemorrhage)(562.11) 11/23/2012   History of depression 11/23/2012   Essential hypertension, benign 11/23/2012     THERAPY DIAG:  Left shoulder pain, unspecified chronicity  Muscle weakness (generalized)  Stiffness of left shoulder, not elsewhere classified  REFERRING PROVIDER: Huel Cote, MD   REFERRING DIAG: S46.012A (ICD-10-CM) - Traumatic complete tear of left rotator cuff, initial encounter    S/p L RSA   THERAPY DIAG:  Left shoulder pain, unspecified chronicity   Stiffness of left shoulder, not elsewhere classified   Muscle weakness (generalized)   Rationale for Evaluation and Treatment: Rehabilitation   ONSET DATE: DOS 12/16/2022   SUBJECTIVE:  SUBJECTIVE STATEMENT: Pt is 18 weeks s/p L RSA.  Patient states arm hurt on Saturday with exercises, took Sunday off and feeling better as of yesterday. RESPONSE TO PRIOR RX:  Pt denies any adverse effects after prior Rx. HEP COMPLIANCE:  Pt reports compliance with HEP.   FUNCTIONAL IMPROVEMENTS:  Pt states she is reaching better including into the overhead cabinet.  She is able to grab a plate or cup out of the cabinet.  Washing/drying hair.  Dressing.  Pt reports she is able to do things at home easier.  Using her UE more without thinking about it.   FUNCTIONAL  LIMITATIONS:  overhead activities though is better.  Lifting a gallon of tea onto the shelf in the fridge.  Form with reaching overhead.  Reaching behind her back.  Reaching across body.     Hand dominance: Right   PERTINENT HISTORY: -L shoulder reverse shoulder arthroplasty (RSA) and biceps tenodesis on 12/16/2022.  -L shoulder RCR and biceps tenodesis in 11/2021. -Arthritis, cervical spondylosis, osteoporosis, HTN -Pt states she does have 2 small tears in her R shoulder and does some pain in R shoulder.     PAIN:  NPRS:  0/10 current, 2-3/10 worst at night, 0/10 best Pt denies soreness.    PRECAUTIONS: Other: Tears and pain in R shoulder, osteoporosis       WEIGHT BEARING RESTRICTIONS: Yes L UE   FALLS:  Has patient fallen in last 6 months? No   LIVING ENVIRONMENT: Lives with: lives alone Lives in: 1 story home Stairs: has steps to enter home and bilat rails on one entrance     OCCUPATION: Pt is retired.   PLOF: Independent   PATIENT GOALS:  to regain 100% movement in arm and use it as normal     OBJECTIVE:    DIAGNOSTIC FINDINGS:  Pt is post op.      TODAY'S TREATMENT:                                                                                                                                         04/26/23 Pulley  in flex, scaption, abd x20 each Manual: Grade II-III  GH inferior glides  in flexion; sidelying scapular mobs; prone IR mob grade II-III; STM Teres minor, infraspinatus; self STM with tennis ball  Shoulder AROM flexion 2 x 10 Seated shoulder ER at 90 with arm supported 2# 3 x 10 Seated arnold press 2 x 10 Standing slide with lift off 2 x 10 Cross body stretch 3 x 20 second holds  04/19/23 Pulley  in flex, scaption, abd x20 each Manual: Grade II-III  GH inferior glides  in flexion; sidelying scapular mobs; prone IR mob grade II-III Cross body stretch 3 x 20 second holds Supine shoulder PNF D2 GTB 3 x 10  Seated shoulder ER at 90 with arm  supported 2# 3 x 10   04/14/2023  Reviewed pain levels, current function, HEP compliance, and response to prior Rx. Pt received L shoulder PROM in flexion, abd, ER, and IR in supine per pt and tissue tolerance.    Pulleys in flex, scaption, abd x20 each Supine serratus punch 2# 3x10 Supine shoulder flexion 2# 2x10 S/L shoulder abduction x10 S/L ER 2x10 Standing jobe's flexion 1# x10 reps Standing scaption 2x10 Shelf reach 2x10 to 2nd shelf Standing functional IR AROM 2x10 Standing ER with arm at side with YTB 2x10 4D ball rolls on wall x10 each   04/12/2023 Reviewed pain levels, current function, HEP compliance, and response to prior Rx. Pt received L shoulder PROM in flexion, abd, ER, and IR in supine per pt and tissue tolerance.    Pulleys in flex, scaption, abd x20 each Supine serratus punch 2# 3x10 Supine shoulder flexion 1# x 10, 2# 2x10 S/L shoulder abduction 2x10 S/L ER 2x10 Shelf reach 2x10 to 2nd shelf Standing scaption 2x10 Standing functional IR AROM 2x10 Standing ER with arm a side with YTB 2x10   04/05/2023 Reviewed pain levels, current function, HEP compliance, and response to prior Rx. Pt received L shoulder PROM in flexion, abd, ER, and IR in supine per pt and tissue tolerance.     Pulleys in flex, scaption, abd x20 each Supine serratus punch 2# 3x10 Supine shoulder flexion 1# 2x10 S/L shoulder abduction 2x10 S/L ER 2x10 Shelf reach x10 to 2nd shelf Standing scaption 2x10 Standing shoulder flexion 1# 1 x 8 Standing shoulder extension with retraction with GTB 2x10 Standing ER with arm a side with YTB 2x10  03/31/2023  L shoulder ROM: AROM in standing:   Flexion:  128 with UT compensatory hike   Scaption:  100 deg with UT compensatory hike  AROM in supine:   Flexion:  147   ER:  60   IR:  54  PROM:    Flex: 158  FOTO:  Prior/Current:  55 / 63.  Goal of 57.  Pulleys in flex, scaption, and abd approx 20 each Supine flexion with 1# 2x10 S/L  shoulder abduction 2x10 Supine serratus punch with 2#  3x10 with assistance and cuing for correct form Standing scaption 2x10 S/L ER with cuing for correct form  2x10 Prone hz abd 2x10 Shelf reach x10 to 2nd shelf  Reviewed HEP and answered questions educating pt with appropriate frequency.    03/18/2023 Pulleys in flex and scaption approx 20 each Standing rows with GTB 2x15 Standing shoulder extension to neutral x 15 and x 10 Standing Jobe's flexion 1# x 10 Standing shoulder flexion with RTB in hands 2x10 Supine serratus punch 2x10 with 2# S/L ER with assistance and cuing for correct form  2x10 Supine ER with YTB x 10 reps Shelf reach (to 2nd shelf)  2x10  Pt received L shoulder PROM in flexion, abd, and ER in supine per pt and tissue tolerance  03/15/23 Pulley flexion/scaption 3 minutes Manual: grade II-III inferior glides in progressive flexion Supine shoulder horizontal abduction GTB 2 x 10 Supine shoulder bilateral ER GTB 3 x 10 Shoulder PNF D2  RTB 3 x 10  Standing shoulder flexion with band in hands RTB 3 x 10 Standing shoulder abduction with perpendicular resistance YTB 2 x 10  Standing shoulder flexion with perpendicular resistance YTB 2 x 10  Standing shoulder flexion/press with manual assist 1# 2 x 10 Review of prior prone exercises- adding 2# weight Prone horizontal abduction 2 x 10    PATIENT EDUCATION: Education details: surgical  and protocol limitations and restrictions, Dx, relevant anatomy, POC, HEP, and exercise form. Person educated: Patient Education method: Explanation, Demonstration, Tactile cues, Verbal cues Education comprehension: verbalized understanding, returned demonstration, verbal cues required, tactile cues required, and needs further education   HOME EXERCISE PROGRAM: Access Code: [email or web address] URL: [email or web address] Date: 02/12/2023 Prepared by: Greig Castilla Jerri Glauser  Exercises - Supine Shoulder Flexion Extension AAROM with  Dowel  - 3 x daily - 7 x weekly - 2 sets - 10 reps - Supine Shoulder External Rotation with Dowel  - 3 x daily - 7 x weekly - 2 sets - 10 reps - Standing 'L' Stretch at Counter  - 3 x daily - 7 x weekly - 2 sets - 10 reps - 5-10 second hold - Supine Single Arm Scapular Protraction  - 1 x daily - 7 x weekly - 3 sets - 10 reps - Sidelying Shoulder Abduction Palm Forward (Mirrored)  - 1 x daily - 7 x weekly - 3 sets - 10 reps - Isometric Shoulder Flexion at Wall (Mirrored)  - 2 x daily - 7 x weekly - 5 reps - 10 second hold - Isometric Shoulder Abduction at Wall  - 5 x daily - 7 x weekly - 5 reps - 10 second hold - Isometric Shoulder Extension at Wall  - 2 x daily - 7 x weekly - 5 reps - 10 second hold - Standing Isometric Shoulder Internal Rotation at Doorway  - 2 x daily - 7 x weekly - 5 reps - 10 second hold - Isometric Shoulder External Rotation at Wall  - 2 x daily - 7 x weekly - 5 reps - 10 second hold - Supine Single Arm Shoulder Protraction  - 1 x daily - 7 x weekly - 3 sets - 10 reps - Prone Shoulder Extension - Single Arm (Mirrored)  - 1 x daily - 7 x weekly - 3 sets - 10 reps - Shoulder Flexion Wall Slide with Towel  - 1 x daily - 7 x weekly - 10 reps - Supine Shoulder Flexion AROM  - 2 x daily - 7 x weekly - 2 sets - 10 reps - Standing Shoulder Flexion Wall Walk  - 1-2 x daily - 7 x weekly - 1 sets - 5 reps - Standing Shoulder Row with Anchored Resistance  - 1 x daily - 7 x weekly - 3 sets - 10 reps - Supine Shoulder External Rotation with Resistance  - 1 x daily - 7 x weekly - 2 sets - 10 reps - Shoulder extension with resistance - Neutral  - 1 x daily - 7 x weekly - 2 sets - 10 reps  03/01/23- Shoulder Alphabet with Ball at [facility]  - 1 x daily - 7 x weekly - 2-3 reps  03/15/23- Prone Single Arm Shoulder Horizontal Abduction with Scapular Retraction and Palm Down  - 1 x daily - 7 x weekly - 3 sets - 10 reps 04/19/23 - Seated Shoulder External Rotation in Abduction Supported with Dumbbell  (Mirrored)  - 1 x daily - 7 x weekly - 3 sets - 10 reps  ASSESSMENT:   CLINICAL IMPRESSION: Patient with improving true flexion AROM but she continues to go into more of a scaption position rather than flexion with increased elevation Performed manual for IR ROM and scap mobility with slight improvement in flexion mechanics. STM to hyperactive and tender RC muscles which may be contributing to IR and end range flexion limitations. Session with focus on shoulder/scapular mechanics  and end range mobility. Patient will continue to benefit from physical therapy in order to improve function and reduce impairment.      OBJECTIVE IMPAIRMENTS: decreased activity tolerance, decreased endurance, decreased ROM, decreased strength, hypomobility, impaired flexibility, impaired UE functional use, and pain.    ACTIVITY LIMITATIONS: carrying, lifting, bathing, dressing, reach over head, and hygiene/grooming   PARTICIPATION LIMITATIONS: meal prep, cleaning, laundry, driving, shopping, and community activity   PERSONAL FACTORS: 3+ comorbidities: cervical spondylosis, osteoporosis, R shoulder pain  are also affecting patient's functional outcome.    REHAB POTENTIAL: Good   CLINICAL DECISION MAKING: Stable/uncomplicated   EVALUATION COMPLEXITY: Low     GOALS:   SHORT TERM GOALS:    Pt will be independent and compliant with HEP for improved pain, ROM, strength, and function  Baseline: Goal status: GOAL MET  Target date:  01/24/2023     2.  Pt will demo improved PROM to 110 deg in flexion and 30 deg in ER for improved shoulder mobility and tightness Baseline:  Goal status: GOAL MET  11/19 Target date:  01/17/2023       3.  Pt will demo L shoulder AAROM to 130 deg in flexion (supine) and 30 deg in ER for improved stiffness and mobility Baseline:  Goal status: GOAL MET  11/19 Target date:  01/31/2023     4.  Pt will be able to actively perform R shoulder flexion AROM to at least 90 deg in  supine.  Baseline:  Goal status: GOAL MET  11/19 Target date:  01/24/2023     5.  Pt will be able to actively elevate R shoulder > 90 deg without significant shoulder hike.  Baseline:  Goal status:  PROGRESSING  1/4 Target date:  02/28/2023     6.  Pt will be able to perform her self care activities with no > than minimal difficulty.   Baseline:  Goal status:  GOAL MET  1/4 Target date:  03/21/2023     LONG TERM GOALS: Target date:   05/07/2023     Pt will be able to perform her ADLs and IADLs without significant difficulty and pain.  Baseline:  Goal status: PROGRESSING  1/4   2.   Pt will demo L shoulder AROM to be [facility] t/o for performance of ADLs and IADLs  Baseline:  Goal status: PROGRESSING  1/4   3.  Pt will be able to perform her normal reaching and functional overhead activities without significant limitation and pain. Baseline:  Goal status:  OINGOING  1/4   4.  Pt will be able to reach into an overhead cabinet without significant difficulty.  Baseline:  Goal status:  GOAL MET 1/4 Target date:  04/04/2023     5.  Pt will progress with strengthening exercises per protocol without adverse effects in order to perform her normal functional lifting/carrying activities and household chores.  Baseline:  Goal status: PROGRESSING   1/4       PLAN:   PT FREQUENCY: 2x/week   PT DURATION: 5 weeks   PLANNED INTERVENTIONS: Therapeutic exercises, Therapeutic activity, Neuromuscular re-education, Patient/Family education, Self Care, Aquatic Therapy, Dry Needling, Electrical stimulation, Cryotherapy, Moist heat, Taping, Manual therapy, and Re-evaluation   PLAN FOR NEXT SESSION: Cont per Dr. Suzan Nailer RSA protocol.     Heidi Shah, PT 04/26/2023, 8:44 AM

## 2023-04-28 ENCOUNTER — Encounter (HOSPITAL_BASED_OUTPATIENT_CLINIC_OR_DEPARTMENT_OTHER): Payer: Self-pay | Admitting: Physical Therapy

## 2023-04-28 ENCOUNTER — Ambulatory Visit (HOSPITAL_BASED_OUTPATIENT_CLINIC_OR_DEPARTMENT_OTHER): Payer: Medicare HMO | Admitting: Physical Therapy

## 2023-04-28 DIAGNOSIS — M25512 Pain in left shoulder: Secondary | ICD-10-CM | POA: Diagnosis not present

## 2023-04-28 DIAGNOSIS — M25612 Stiffness of left shoulder, not elsewhere classified: Secondary | ICD-10-CM | POA: Diagnosis not present

## 2023-04-28 DIAGNOSIS — M6281 Muscle weakness (generalized): Secondary | ICD-10-CM | POA: Diagnosis not present

## 2023-04-28 DIAGNOSIS — M419 Scoliosis, unspecified: Secondary | ICD-10-CM | POA: Diagnosis not present

## 2023-04-28 NOTE — Therapy (Signed)
OUTPATIENT PHYSICAL THERAPY TREATMENT        Patient Name: Heidi Shah MRN: [id number] DOB:1945-04-05, 78 y.o., female Today's Date: 04/29/2023  END OF SESSION:  PT End of Session - 04/28/23 1228     Visit Number 26    Number of Visits 30    Date for PT Re-Evaluation 05/07/23    Authorization Type human[employer] MCR    PT Start Time 1200    PT Stop Time 1241    PT Time Calculation (min) 41 min    Activity Tolerance Patient tolerated treatment well    Behavior During Therapy WFL for tasks assessed/performed                   Past Medical History:  Diagnosis Date   Allergy    Anxiety    Arthritis    Borderline glaucoma    Cataract    surgery - Bilateral cataracts removed   Cervical spondylolysis    Chicken pox    Colon polyps    Depression    Diverticulitis    Diverticulosis    Esophageal spasm    Esophageal stricture    Family history of adverse reaction to anesthesia    difficult time waking mother up   GERD (gastroesophageal reflux disease)    Heart murmur    Hypertension    Osteoporosis    Traumatic complete tear of left rotator cuff    Past Surgical History:  Procedure Laterality Date   cataract surgery Bilateral 1996   COLONOSCOPY  2022   EYE SURGERY  2000   detached L retina   HERNIA REPAIR Bilateral 1990   inguinal   NECK SURGERY     age 86-trimmed a muscle due to not being able to hold her head up   POLYPECTOMY  2022   REVERSE SHOULDER ARTHROPLASTY Left 12/16/2022   Procedure: LEFT REVERSE SHOULDER ARTHROPLASTY;  Surgeon: Huel Cote, MD;  Location: [facility];  Service: Orthopedics;  Laterality: Left;   SHOULDER ARTHROSCOPY WITH ROTATOR CUFF REPAIR AND OPEN BICEPS TENODESIS Left 12/10/2021   Procedure: LEFT SHOULDER ARTHROSCOPY WITH ROTATOR CUFF REPAIR AND OPEN BICEPS TENODESIS;  Surgeon: Huel Cote, MD;  Location: [facility];  Service: Orthopedics;  Laterality: Left;   UPPER GASTROINTESTINAL ENDOSCOPY  2022   Patient Active Problem  List   Diagnosis Date Noted   Traumatic complete tear of left rotator cuff    Cervical spondylosis with myelopathy and radiculopathy 03/14/2020   Neck pain 03/14/2020   Aortic heart murmur 02/25/2020   Age-related osteoporosis without current pathological fracture 04/04/2019   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   Constipation 03/05/2013   GERD (gastroesophageal reflux disease) 01/19/2013   Esophageal spasm 01/19/2013   Dysphagia 01/19/2013   Diverticulitis of colon (without mention of hemorrhage)(562.11) 11/23/2012   History of depression 11/23/2012   Essential hypertension, benign 11/23/2012     THERAPY DIAG:  Left shoulder pain, unspecified chronicity  Muscle weakness (generalized)  Stiffness of left shoulder, not elsewhere classified  REFERRING PROVIDER: Huel Cote, MD   REFERRING DIAG: S46.012A (ICD-10-CM) - Traumatic complete tear of left rotator cuff, initial encounter    S/p L RSA   THERAPY DIAG:  Left shoulder pain, unspecified chronicity   Stiffness of left shoulder, not elsewhere classified   Muscle weakness (generalized)   Rationale for Evaluation and Treatment: Rehabilitation   ONSET DATE: DOS 12/16/2022   SUBJECTIVE:  SUBJECTIVE STATEMENT: Pt is 19 weeks s/p L RSA.  Patient states she may have pushed herself too much with the exercises on Saturday.  She took Sunday off and felt better.  She states she was sore after prior Rx.  Pt is performing HEP.        Hand dominance: Right   PERTINENT HISTORY: -L shoulder reverse shoulder arthroplasty (RSA) and biceps tenodesis on 12/16/2022.  -L shoulder RCR and biceps tenodesis in 11/2021. -Arthritis, cervical spondylosis, osteoporosis, HTN -Pt states she does have 2 small tears in her R shoulder and does some pain in R shoulder.      PAIN:  NPRS:  0/10 current, 2-3/10 worst at night, 0/10 best Pt denies soreness.    PRECAUTIONS: Other: Tears and pain in R shoulder, osteoporosis       WEIGHT BEARING RESTRICTIONS: Yes L UE   FALLS:  Has patient fallen in last 6 months? No   LIVING ENVIRONMENT: Lives with: lives alone Lives in: 1 story home Stairs: has steps to enter home and bilat rails on one entrance     OCCUPATION: Pt is retired.   PLOF: Independent   PATIENT GOALS:  to regain 100% movement in arm and use it as normal     OBJECTIVE:    DIAGNOSTIC FINDINGS:  Pt is post op.      TODAY'S TREATMENT:                                                                                                                                         04/28/2023 PT answered questions concerning self STM with tennis ball STM to teres minor and medial scap mm  Pt received L shoulder flexion, abd, ER, and IR in supine.   Supine shoulder flexion 2# 2x10 Seated shoulder ER at 75-80 deg 2# 2x10 Standing slide with lift off 2 x 10 Standing scaption 2x10 4D ball rolls on wall x 10 each   04/26/23 Pulley  in flex, scaption, abd x20 each Manual: Grade II-III  GH inferior glides  in flexion; sidelying scapular mobs; prone IR mob grade II-III; STM Teres minor, infraspinatus; self STM with tennis ball  Shoulder AROM flexion 2 x 10 Seated shoulder ER at 90 with arm supported 2# 3 x 10 Seated arnold press 2 x 10 Standing slide with lift off 2 x 10 Cross body stretch 3 x 20 second holds  04/19/23 Pulley  in flex, scaption, abd x20 each Manual: Grade II-III  GH inferior glides  in flexion; sidelying scapular mobs; prone IR mob grade II-III Cross body stretch 3 x 20 second holds Supine shoulder PNF D2 GTB 3 x 10  Seated shoulder ER at 90 with arm supported 2# 3 x 10   04/14/2023 Reviewed pain levels, current function, HEP compliance, and response to prior Rx. Pt received L shoulder PROM in flexion, abd, ER, and IR in supine  per pt and tissue tolerance.    Pulleys in flex, scaption, abd x20 each Supine serratus punch 2# 3x10 Supine shoulder flexion 2# 2x10 S/L shoulder abduction x10 S/L ER 2x10 Standing jobe's flexion 1# x10 reps Standing scaption 2x10 Shelf reach 2x10 to 2nd shelf Standing functional IR AROM 2x10 Standing ER with arm at side with YTB 2x10 4D ball rolls on wall x10 each   04/12/2023 Reviewed pain levels, current function, HEP compliance, and response to prior Rx. Pt received L shoulder PROM in flexion, abd, ER, and IR in supine per pt and tissue tolerance.    Pulleys in flex, scaption, abd x20 each Supine serratus punch 2# 3x10 Supine shoulder flexion 1# x 10, 2# 2x10 S/L shoulder abduction 2x10 S/L ER 2x10 Shelf reach 2x10 to 2nd shelf Standing scaption 2x10 Standing functional IR AROM 2x10 Standing ER with arm a side with YTB 2x10   04/05/2023 Reviewed pain levels, current function, HEP compliance, and response to prior Rx. Pt received L shoulder PROM in flexion, abd, ER, and IR in supine per pt and tissue tolerance.     Pulleys in flex, scaption, abd x20 each Supine serratus punch 2# 3x10 Supine shoulder flexion 1# 2x10 S/L shoulder abduction 2x10 S/L ER 2x10 Shelf reach x10 to 2nd shelf Standing scaption 2x10 Standing shoulder flexion 1# 1 x 8 Standing shoulder extension with retraction with GTB 2x10 Standing ER with arm a side with YTB 2x10  03/31/2023  L shoulder ROM: AROM in standing:   Flexion:  128 with UT compensatory hike   Scaption:  100 deg with UT compensatory hike  AROM in supine:   Flexion:  147   ER:  60   IR:  54  PROM:    Flex: 158  FOTO:  Prior/Current:  55 / 63.  Goal of 57.  Pulleys in flex, scaption, and abd approx 20 each Supine flexion with 1# 2x10 S/L shoulder abduction 2x10 Supine serratus punch with 2#  3x10 with assistance and cuing for correct form Standing scaption 2x10 S/L ER with cuing for correct form  2x10 Prone hz abd  2x10 Shelf reach x10 to 2nd shelf  Reviewed HEP and answered questions educating pt with appropriate frequency.    03/18/2023 Pulleys in flex and scaption approx 20 each Standing rows with GTB 2x15 Standing shoulder extension to neutral x 15 and x 10 Standing Jobe's flexion 1# x 10 Standing shoulder flexion with RTB in hands 2x10 Supine serratus punch 2x10 with 2# S/L ER with assistance and cuing for correct form  2x10 Supine ER with YTB x 10 reps Shelf reach (to 2nd shelf)  2x10  Pt received L shoulder PROM in flexion, abd, and ER in supine per pt and tissue tolerance  03/15/23 Pulley flexion/scaption 3 minutes Manual: grade II-III inferior glides in progressive flexion Supine shoulder horizontal abduction GTB 2 x 10 Supine shoulder bilateral ER GTB 3 x 10 Shoulder PNF D2  RTB 3 x 10  Standing shoulder flexion with band in hands RTB 3 x 10 Standing shoulder abduction with perpendicular resistance YTB 2 x 10  Standing shoulder flexion with perpendicular resistance YTB 2 x 10  Standing shoulder flexion/press with manual assist 1# 2 x 10 Review of prior prone exercises- adding 2# weight Prone horizontal abduction 2 x 10    PATIENT EDUCATION: Education details: surgical and protocol limitations and restrictions, Dx, relevant anatomy, POC, HEP, and exercise form. Person educated: Patient Education method: Explanation, Demonstration, Tactile cues, Verbal cues Education  comprehension: verbalized understanding, returned demonstration, verbal cues required, tactile cues required, and needs further education   HOME EXERCISE PROGRAM: Access Code: [email or web address] URL: [email or web address] Date: 02/12/2023 Prepared by: Greig Castilla Zaunegger  Exercises - Supine Shoulder Flexion Extension AAROM with Dowel  - 3 x daily - 7 x weekly - 2 sets - 10 reps - Supine Shoulder External Rotation with Dowel  - 3 x daily - 7 x weekly - 2 sets - 10 reps - Standing 'L' Stretch at Counter  - 3  x daily - 7 x weekly - 2 sets - 10 reps - 5-10 second hold - Supine Single Arm Scapular Protraction  - 1 x daily - 7 x weekly - 3 sets - 10 reps - Sidelying Shoulder Abduction Palm Forward (Mirrored)  - 1 x daily - 7 x weekly - 3 sets - 10 reps - Isometric Shoulder Flexion at Wall (Mirrored)  - 2 x daily - 7 x weekly - 5 reps - 10 second hold - Isometric Shoulder Abduction at Wall  - 5 x daily - 7 x weekly - 5 reps - 10 second hold - Isometric Shoulder Extension at Wall  - 2 x daily - 7 x weekly - 5 reps - 10 second hold - Standing Isometric Shoulder Internal Rotation at Doorway  - 2 x daily - 7 x weekly - 5 reps - 10 second hold - Isometric Shoulder External Rotation at Wall  - 2 x daily - 7 x weekly - 5 reps - 10 second hold - Supine Single Arm Shoulder Protraction  - 1 x daily - 7 x weekly - 3 sets - 10 reps - Prone Shoulder Extension - Single Arm (Mirrored)  - 1 x daily - 7 x weekly - 3 sets - 10 reps - Shoulder Flexion Wall Slide with Towel  - 1 x daily - 7 x weekly - 10 reps - Supine Shoulder Flexion AROM  - 2 x daily - 7 x weekly - 2 sets - 10 reps - Standing Shoulder Flexion Wall Walk  - 1-2 x daily - 7 x weekly - 1 sets - 5 reps - Standing Shoulder Row with Anchored Resistance  - 1 x daily - 7 x weekly - 3 sets - 10 reps - Supine Shoulder External Rotation with Resistance  - 1 x daily - 7 x weekly - 2 sets - 10 reps - Shoulder extension with resistance - Neutral  - 1 x daily - 7 x weekly - 2 sets - 10 reps  03/01/23- Shoulder Alphabet with Ball at [facility]  - 1 x daily - 7 x weekly - 2-3 reps  03/15/23- Prone Single Arm Shoulder Horizontal Abduction with Scapular Retraction and Palm Down  - 1 x daily - 7 x weekly - 3 sets - 10 reps 04/19/23 - Seated Shoulder External Rotation in Abduction Supported with Dumbbell (Mirrored)  - 1 x daily - 7 x weekly - 3 sets - 10 reps  ASSESSMENT:   CLINICAL IMPRESSION: Patient is improving with UE elevation ROM.  She continues to go into more of a  scaption position as she increases flexion AROM with elevation.  She continues to improve with ER performance.  Pt required cuing for correct form with standing 4D ball rolls on wall.  Pt has tender areas in medial scap mm and teres minor.  PT answered questions concerning self soft tissue work and pt demonstrates good understanding.  Pt responded well to Rx having no c/o's and no increased pain  after Rx.  She should continue to benefit from continued skilled PT per protocol in order to improve ROM and strength, address ongoing goals, and to assist in restoring desired level of function.      OBJECTIVE IMPAIRMENTS: decreased activity tolerance, decreased endurance, decreased ROM, decreased strength, hypomobility, impaired flexibility, impaired UE functional use, and pain.    ACTIVITY LIMITATIONS: carrying, lifting, bathing, dressing, reach over head, and hygiene/grooming   PARTICIPATION LIMITATIONS: meal prep, cleaning, laundry, driving, shopping, and community activity   PERSONAL FACTORS: 3+ comorbidities: cervical spondylosis, osteoporosis, R shoulder pain  are also affecting patient's functional outcome.    REHAB POTENTIAL: Good   CLINICAL DECISION MAKING: Stable/uncomplicated   EVALUATION COMPLEXITY: Low     GOALS:   SHORT TERM GOALS:    Pt will be independent and compliant with HEP for improved pain, ROM, strength, and function  Baseline: Goal status: GOAL MET  Target date:  01/24/2023     2.  Pt will demo improved PROM to 110 deg in flexion and 30 deg in ER for improved shoulder mobility and tightness Baseline:  Goal status: GOAL MET  11/19 Target date:  01/17/2023       3.  Pt will demo L shoulder AAROM to 130 deg in flexion (supine) and 30 deg in ER for improved stiffness and mobility Baseline:  Goal status: GOAL MET  11/19 Target date:  01/31/2023     4.  Pt will be able to actively perform R shoulder flexion AROM to at least 90 deg in supine.  Baseline:  Goal  status: GOAL MET  11/19 Target date:  01/24/2023     5.  Pt will be able to actively elevate R shoulder > 90 deg without significant shoulder hike.  Baseline:  Goal status:  PROGRESSING  1/4 Target date:  02/28/2023     6.  Pt will be able to perform her self care activities with no > than minimal difficulty.   Baseline:  Goal status:  GOAL MET  1/4 Target date:  03/21/2023     LONG TERM GOALS: Target date:   05/07/2023     Pt will be able to perform her ADLs and IADLs without significant difficulty and pain.  Baseline:  Goal status: PROGRESSING  1/4   2.   Pt will demo L shoulder AROM to be [facility] t/o for performance of ADLs and IADLs  Baseline:  Goal status: PROGRESSING  1/4   3.  Pt will be able to perform her normal reaching and functional overhead activities without significant limitation and pain. Baseline:  Goal status:  OINGOING  1/4   4.  Pt will be able to reach into an overhead cabinet without significant difficulty.  Baseline:  Goal status:  GOAL MET 1/4 Target date:  04/04/2023     5.  Pt will progress with strengthening exercises per protocol without adverse effects in order to perform her normal functional lifting/carrying activities and household chores.  Baseline:  Goal status: PROGRESSING   1/4       PLAN:   PT FREQUENCY: 2x/week   PT DURATION: 5 weeks   PLANNED INTERVENTIONS: Therapeutic exercises, Therapeutic activity, Neuromuscular re-education, Patient/Family education, Self Care, Aquatic Therapy, Dry Needling, Electrical stimulation, Cryotherapy, Moist heat, Taping, Manual therapy, and Re-evaluation   PLAN FOR NEXT SESSION: Cont per Dr. Suzan Nailer RSA protocol.     Audie Clear III PT, DPT 04/29/23 1:24 PM

## 2023-05-02 ENCOUNTER — Ambulatory Visit (HOSPITAL_BASED_OUTPATIENT_CLINIC_OR_DEPARTMENT_OTHER): Payer: Medicare HMO | Admitting: Orthopaedic Surgery

## 2023-05-02 DIAGNOSIS — M25511 Pain in right shoulder: Secondary | ICD-10-CM | POA: Diagnosis not present

## 2023-05-02 DIAGNOSIS — M7541 Impingement syndrome of right shoulder: Secondary | ICD-10-CM

## 2023-05-02 MED ORDER — TRIAMCINOLONE ACETONIDE 40 MG/ML IJ SUSP
80.0000 mg | INTRAMUSCULAR | Status: AC | PRN
  Administered 2023-05-02: 80 mg via INTRA_ARTICULAR

## 2023-05-02 MED ORDER — LIDOCAINE HCL 1 % IJ SOLN
4.0000 mL | INTRAMUSCULAR | Status: AC | PRN
  Administered 2023-05-02: 4 mL

## 2023-05-02 NOTE — Progress Notes (Signed)
Post Operative Evaluation    Procedure/Date of Surgery: Left shoulder reverse shoulder arthroplasty 9/19  Interval History:   Presents today for follow-up of her right shoulder.  Overall the left shoulder is continuing to improve.  Right shoulder does have persistent tenderness about the anterior superior aspect of this she is here today for MRI discussion PMH/PSH/Family History/Social History/Meds/Allergies:    Past Medical History:  Diagnosis Date  . Allergy   . Anxiety   . Arthritis   . Borderline glaucoma   . Cataract    surgery - Bilateral cataracts removed  . Cervical spondylolysis   . Chicken pox   . Colon polyps   . Depression   . Diverticulitis   . Diverticulosis   . Esophageal spasm   . Esophageal stricture   . Family history of adverse reaction to anesthesia    difficult time waking mother up  . GERD (gastroesophageal reflux disease)   . Heart murmur   . Hypertension   . Osteoporosis   . Traumatic complete tear of left rotator cuff    Past Surgical History:  Procedure Laterality Date  . cataract surgery Bilateral 1996  . COLONOSCOPY  2022  . EYE SURGERY  2000   detached L retina  . HERNIA REPAIR Bilateral 1990   inguinal  . NECK SURGERY     age 70-trimmed a muscle due to not being able to hold her head up  . POLYPECTOMY  2022  . REVERSE SHOULDER ARTHROPLASTY Left 12/16/2022   Procedure: LEFT REVERSE SHOULDER ARTHROPLASTY;  Surgeon: Huel Cote, MD;  Location: [facility] ORS;  Service: Orthopedics;  Laterality: Left;  . SHOULDER ARTHROSCOPY WITH ROTATOR CUFF REPAIR AND OPEN BICEPS TENODESIS Left 12/10/2021   Procedure: LEFT SHOULDER ARTHROSCOPY WITH ROTATOR CUFF REPAIR AND OPEN BICEPS TENODESIS;  Surgeon: Huel Cote, MD;  Location: [facility] OR;  Service: Orthopedics;  Laterality: Left;  . UPPER GASTROINTESTINAL ENDOSCOPY  2022   Social History   Socioeconomic History  . Marital status: Divorced    Spouse name: Not on  file  . Number of children: 2  . Years of education: master's degree  . Highest education level: Master's degree (e.g., MA, MS, MEng, MEd, MSW, MBA)  Occupational History  . Occupation: retired Runner, broadcasting/film/video  Tobacco Use  . Smoking status: Never  . Smokeless tobacco: Never  Vaping Use  . Vaping status: Never Used  Substance and Sexual Activity  . Alcohol use: Yes    Alcohol/week: 1.0 standard drink of alcohol    Types: 1 Glasses of wine per week    Comment: one or two glasses of wine a couple times a week  . Drug use: No  . Sexual activity: Not on file  Other Topics Concern  . Not on file  Social History Narrative      Lives alone in ranch home.    2 children   Son lives in [place]   Continues to teach virtually part time   Social Drivers of [employer]: Low Risk  (01/18/2023)   Overall Financial Resource Strain (CARDIA)   . Difficulty of Paying Living Expenses: Not hard at all  Food Insecurity: No Food Insecurity (01/18/2023)   Hunger Vital Sign   . Worried About Programme researcher, broadcasting/film/video in the Last Year: Never true   .  Ran Out of Food in the Last Year: Never true  Transportation Needs: No Transportation Needs (01/18/2023)   PRAPARE - Transportation   . Lack of Transportation (Medical): No   . Lack of Transportation (Non-Medical): No  Physical Activity: Unknown (01/18/2023)   Exercise Vital Sign   . Days of Exercise per Week: 1 day   . Minutes of Exercise per Session: Patient declined  Recent Concern: Physical Activity - Inactive (12/03/2022)   Exercise Vital Sign   . Days of Exercise per Week: 0 days   . Minutes of Exercise per Session: 60 min  Stress: No Stress Concern Present (01/18/2023)   [employer] of Occupational Health - Occupational Stress Questionnaire   . Feeling of Stress : Not at all  Social Connections: Moderately Integrated (01/18/2023)   Social Connection and Isolation Panel [NHANES]   . Frequency of Communication with Friends  and Family: Once a week   . Frequency of Social Gatherings with Friends and Family: Twice a week   . Attends Religious Services: More than 4 times per year   . Active Member of Clubs or Organizations: Yes   . Attends Banker Meetings: More than 4 times per year   . Marital Status: Divorced   Family History  Problem Relation Age of Onset  . Heart disease Mother 48  . COPD Mother   . Crohn's disease Mother   . Heart disease Father 59  . Cancer Father        kidney  . Colon cancer Neg Hx   . Esophageal cancer Neg Hx   . Stomach cancer Neg Hx   . Rectal cancer Neg Hx    Allergies  Allergen Reactions  . Nickel Sulfate [Nickel] Rash    Per skin test from dermatologist  . Beeswax     Per skin test from dermatologist  . Cetrimonium Chloride [Cetrimide]     Per skin test from dermatologist  . Lactose Intolerance (Gi) Diarrhea  . Methylisothiazolinone     Per skin test from dermatologist  . Neomycin Sulfate [Neomycin]     Per skin test from dermatologist  . Penicillins     Childhood Reaction   . Propolis     Per skin test from dermatologist   Current Outpatient Medications  Medication Sig Dispense Refill  . alendronate (FOSAMAX) 70 MG tablet Take 1 tablet (70 mg total) by mouth every 7 (seven) days. Take with a full glass of water on an empty stomach. 4 tablet 11  . amLODipine (NORVASC) 2.5 MG tablet Take 1 tablet (2.5 mg total) by mouth daily. (Patient taking differently: Take 2.5 mg by mouth every morning.) 90 tablet 3  . Biotin w/ Vitamins C & E (HAIR SKIN & NAILS GUMMIES PO) Take 2 each by mouth 4 (four) times a week.    . Calcium Carb-Cholecalciferol (CALCIUM 600+D3 PO) Take 1 tablet by mouth daily.    . cetirizine (ZYRTEC) 10 MG chewable tablet Chew 10 mg by mouth every morning.    . cyanocobalamin (VITAMIN B12) 1000 MCG tablet Take 1,000 mcg by mouth daily.    . Ginkgo Biloba 60 MG CAPS Take 60 mg by mouth daily.    Marland Kitchen ibuprofen (ADVIL) 800 MG tablet Take 800  mg by mouth every 8 (eight) hours as needed.    Marland Kitchen lisinopril-hydrochlorothiazide (ZESTORETIC) 20-12.5 MG tablet Take 1 tablet by mouth daily. (Patient taking differently: Take 1 tablet by mouth every morning.) 90 tablet 3  . LORazepam (ATIVAN) 0.5 MG  tablet Take 1 to 2 tablets 1 hour prior to MRI procedure 2 tablet 0  . Multiple Vitamin (MULTI-VITAMIN) tablet Take 1 tablet by mouth 4 (four) times a week.    Marland Kitchen omeprazole (PRILOSEC) 40 MG capsule Take 1 capsule (40 mg total) by mouth daily. (Patient taking differently: Take 40 mg by mouth every morning.) 90 capsule 3  . vitamin C (ASCORBIC ACID) 500 MG tablet Take 500 mg by mouth 4 (four) times a week.    Marland Kitchen VYZULTA 0.024 % SOLN Place 1 drop into both eyes at bedtime.     No current facility-administered medications for this visit.   No results found.   Review of Systems:   A ROS was performed including pertinent positives and negatives as documented in the HPI.   Musculoskeletal Exam:    There were no vitals taken for this visit.  Left incision is well-appearing without erythema or drainage.  In the spine position she is able to forward elevate to 135 with external rotation at the side to 45 actively.  Internal rotation to back pocket.  Distal neurosensory exam is intact  Right shoulder with tenderness to palpation about the lateral deltoid.  She has weakness 4 out of 5 with forward elevation.  Negative belly press.  External rotation at the side is to 45 bilaterally.  Forward elevation is to 170 with pain and weakness on the right.  Positive Neer impingement  Imaging:    3 views right shoulder: Status post left reverse shoulder arthroplasty without evidence of complication  MRI right shoulder: There is intrasubstance tearing of the rotator cuff without a discrete tear.,  Significant fluid around the biceps consistent with biceps tearing thickened coracoacromial ligament consistent subacromial impingement  I personally reviewed and  interpreted the radiographs.   Assessment:   78 year old female with right shoulder pain consistent with rotator cuff impingement and biceps tendinitis.  This time she is hoping to defer any type of intervention.  She is requesting an additional injection which I will plan to proceed with today.  I did discuss that she may ultimately be a candidate for an acromioplasty as well as biceps tenodesis.  At this time she would like to further consider her options.  Plan :    -Right shoulder ultrasound-guided injection provided verbal consent.    Procedure Note  Patient: Heidi Shah             Date of Birth: [birth year]           MRN: [id number]             Visit Date: 05/02/2023  Procedures: Visit Diagnoses: No diagnosis found.  Large Joint Inj: R subacromial bursa on 05/02/2023 12:08 PM Indications: pain Details: 22 G 1.5 in needle, ultrasound-guided anterior approach  Arthrogram: No  Medications: 4 mL lidocaine 1 %; 80 mg triamcinolone acetonide 40 MG/ML Outcome: tolerated well, no immediate complications Procedure, treatment alternatives, risks and benefits explained, specific risks discussed. Consent was given by the patient. Immediately prior to procedure a time out was called to verify the correct patient, procedure, equipment, support staff and site/side marked as required. Patient was prepped and draped in the usual sterile fashion.           I personally saw and evaluated the patient, and participated in the management and treatment plan.  Huel Cote, MD Attending Physician, Orthopedic Surgery  This document was dictated using Dragon voice recognition software. A reasonable attempt at proof reading has been made  to minimize errors.

## 2023-05-05 ENCOUNTER — Ambulatory Visit (HOSPITAL_BASED_OUTPATIENT_CLINIC_OR_DEPARTMENT_OTHER): Payer: Medicare HMO | Attending: Orthopaedic Surgery | Admitting: Physical Therapy

## 2023-05-05 DIAGNOSIS — M25512 Pain in left shoulder: Secondary | ICD-10-CM | POA: Insufficient documentation

## 2023-05-05 DIAGNOSIS — M6281 Muscle weakness (generalized): Secondary | ICD-10-CM | POA: Insufficient documentation

## 2023-05-05 DIAGNOSIS — M25612 Stiffness of left shoulder, not elsewhere classified: Secondary | ICD-10-CM | POA: Insufficient documentation

## 2023-05-05 NOTE — Therapy (Signed)
 OUTPATIENT PHYSICAL THERAPY TREATMENT        Patient Name: Heidi Shah MRN: [id number] DOB:Dec 17, 1945, 78 y.o., female Today's Date: 05/06/2023  END OF SESSION:  PT End of Session - 05/05/23 1115     Visit Number 27    Number of Visits 30    Date for PT Re-Evaluation 05/07/23    Authorization Type human[employer] MCR    PT Start Time 1112    PT Stop Time 1151    PT Time Calculation (min) 39 min    Activity Tolerance Patient tolerated treatment well    Behavior During Therapy WFL for tasks assessed/performed                   Past Medical History:  Diagnosis Date   Allergy     Anxiety    Arthritis    Borderline glaucoma    Cataract    surgery - Bilateral cataracts removed   Cervical spondylolysis    Chicken pox    Colon polyps    Depression    Diverticulitis    Diverticulosis    Esophageal spasm    Esophageal stricture    Family history of adverse reaction to anesthesia    difficult time waking mother up   GERD (gastroesophageal reflux disease)    Heart murmur    Hypertension    Osteoporosis    Traumatic complete tear of left rotator cuff    Past Surgical History:  Procedure Laterality Date   cataract surgery Bilateral 1996   COLONOSCOPY  2022   EYE SURGERY  2000   detached L retina   HERNIA REPAIR Bilateral 1990   inguinal   NECK SURGERY     age 22-trimmed a muscle due to not being able to hold her head up   POLYPECTOMY  2022   REVERSE SHOULDER ARTHROPLASTY Left 12/16/2022   Procedure: LEFT REVERSE SHOULDER ARTHROPLASTY;  Surgeon: Genelle Standing, MD;  Location: [facility];  Service: Orthopedics;  Laterality: Left;   SHOULDER ARTHROSCOPY WITH ROTATOR CUFF REPAIR AND OPEN BICEPS TENODESIS Left 12/10/2021   Procedure: LEFT SHOULDER ARTHROSCOPY WITH ROTATOR CUFF REPAIR AND OPEN BICEPS TENODESIS;  Surgeon: Genelle Standing, MD;  Location: [facility];  Service: Orthopedics;  Laterality: Left;   UPPER GASTROINTESTINAL ENDOSCOPY  2022   Patient Active Problem  List   Diagnosis Date Noted   Traumatic complete tear of left rotator cuff    Cervical spondylosis with myelopathy and radiculopathy 03/14/2020   Neck pain 03/14/2020   Aortic heart murmur 02/25/2020   Age-related osteoporosis without current pathological fracture 04/04/2019   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   Constipation 03/05/2013   GERD (gastroesophageal reflux disease) 01/19/2013   Esophageal spasm 01/19/2013   Dysphagia 01/19/2013   Diverticulitis of colon (without mention of hemorrhage)(562.11) 11/23/2012   History of depression 11/23/2012   Essential hypertension, benign 11/23/2012     THERAPY DIAG:  Left shoulder pain, unspecified chronicity  Muscle weakness (generalized)  Stiffness of left shoulder, not elsewhere classified  REFERRING PROVIDER: Genelle Standing, MD   REFERRING DIAG: S46.012A (ICD-10-CM) - Traumatic complete tear of left rotator cuff, initial encounter    S/p L RSA   THERAPY DIAG:  Left shoulder pain, unspecified chronicity   Stiffness of left shoulder, not elsewhere classified   Muscle weakness (generalized)   Rationale for Evaluation and Treatment: Rehabilitation   ONSET DATE: DOS 12/16/2022   SUBJECTIVE:  SUBJECTIVE STATEMENT: Pt is 20 weeks s/p L RSA.  Pt denies any adverse effects after prior Rx.  Pt states she still has soreness primarily at night.  Things are getting easier.  pt has difficulty with donning coat and donning a shirt overhead.  Pt had a massage on Saturday which she states helped.  Pt is performing her HEP.       Hand dominance: Right   PERTINENT HISTORY: -L shoulder reverse shoulder arthroplasty (RSA) and biceps tenodesis on 12/16/2022.  -L shoulder RCR and biceps tenodesis in 11/2021. -Arthritis, cervical spondylosis, osteoporosis,  HTN -Pt states she does have 2 small tears in her R shoulder and does some pain in R shoulder.     PAIN:  NPRS:  0/10 current, 2-3/10 worst at night, 0/10 best Pt denies soreness.    PRECAUTIONS: Other: Tears and pain in R shoulder, osteoporosis       WEIGHT BEARING RESTRICTIONS: Yes L UE   FALLS:  Has patient fallen in last 6 months? No   LIVING ENVIRONMENT: Lives with: lives alone Lives in: 1 story home Stairs: has steps to enter home and bilat rails on one entrance     OCCUPATION: Pt is retired.   PLOF: Independent   PATIENT GOALS:  to regain 100% movement in arm and use it as normal     OBJECTIVE:    DIAGNOSTIC FINDINGS:  Pt is post op.      TODAY'S TREATMENT:                                                                                                                                         05/05/23 Pulleys x 20 reps in flex, scaption, and abduction Standing slide with lift off x 10 Standing jobe's flexion 2x10, 1# x 10 Standing scaption 2x10 Shelf reach x10, 1# x 7 Seated shoulder ER at 70 deg 2# 2x10 4D ball rolls on wall 2x10 Standing functional IR AROM x10  Pt received L shoulder flexion, abd, ER, and IR PROM in supine per pt and tissue tolerance.     04/28/2023 PT answered questions concerning self STM with tennis ball STM to teres minor and medial scap mm  Pt received L shoulder flexion, abd, ER, and IR in supine.   Supine shoulder flexion 2# 2x10 Seated shoulder ER at 75-80 deg 2# 2x10 Standing slide with lift off 2 x 10 Standing scaption 2x10 4D ball rolls on wall x 10 each   04/26/23 Pulley  in flex, scaption, abd x20 each Manual: Grade II-III  GH inferior glides  in flexion; sidelying scapular mobs; prone IR mob grade II-III; STM Teres minor, infraspinatus; self STM with tennis ball  Shoulder AROM flexion 2 x 10 Seated shoulder ER at 90 with arm supported 2# 3 x 10 Seated arnold press 2 x 10 Standing slide with lift off 2 x 10 Cross  body stretch 3  x 20 second holds  04/19/23 Pulley  in flex, scaption, abd x20 each Manual: Grade II-III  GH inferior glides  in flexion; sidelying scapular mobs; prone IR mob grade II-III Cross body stretch 3 x 20 second holds Supine shoulder PNF D2 GTB 3 x 10  Seated shoulder ER at 90 with arm supported 2# 3 x 10   04/14/2023 Reviewed pain levels, current function, HEP compliance, and response to prior Rx. Pt received L shoulder PROM in flexion, abd, ER, and IR in supine per pt and tissue tolerance.    Pulleys in flex, scaption, abd x20 each Supine serratus punch 2# 3x10 Supine shoulder flexion 2# 2x10 S/L shoulder abduction x10 S/L ER 2x10 Standing jobe's flexion 1# x10 reps Standing scaption 2x10 Shelf reach 2x10 to 2nd shelf Standing functional IR AROM 2x10 Standing ER with arm at side with YTB 2x10 4D ball rolls on wall x10 each   04/12/2023 Reviewed pain levels, current function, HEP compliance, and response to prior Rx. Pt received L shoulder PROM in flexion, abd, ER, and IR in supine per pt and tissue tolerance.    Pulleys in flex, scaption, abd x20 each Supine serratus punch 2# 3x10 Supine shoulder flexion 1# x 10, 2# 2x10 S/L shoulder abduction 2x10 S/L ER 2x10 Shelf reach 2x10 to 2nd shelf Standing scaption 2x10 Standing functional IR AROM 2x10 Standing ER with arm a side with YTB 2x10   04/05/2023 Reviewed pain levels, current function, HEP compliance, and response to prior Rx. Pt received L shoulder PROM in flexion, abd, ER, and IR in supine per pt and tissue tolerance.     Pulleys in flex, scaption, abd x20 each Supine serratus punch 2# 3x10 Supine shoulder flexion 1# 2x10 S/L shoulder abduction 2x10 S/L ER 2x10 Shelf reach x10 to 2nd shelf Standing scaption 2x10 Standing shoulder flexion 1# 1 x 8 Standing shoulder extension with retraction with GTB 2x10 Standing ER with arm a side with YTB 2x10  03/31/2023  L shoulder ROM: AROM in  standing:   Flexion:  128 with UT compensatory hike   Scaption:  100 deg with UT compensatory hike  AROM in supine:   Flexion:  147   ER:  60   IR:  54  PROM:    Flex: 158  FOTO:  Prior/Current:  55 / 63.  Goal of 57.  Pulleys in flex, scaption, and abd approx 20 each Supine flexion with 1# 2x10 S/L shoulder abduction 2x10 Supine serratus punch with 2#  3x10 with assistance and cuing for correct form Standing scaption 2x10 S/L ER with cuing for correct form  2x10 Prone hz abd 2x10 Shelf reach x10 to 2nd shelf  Reviewed HEP and answered questions educating pt with appropriate frequency.    03/18/2023 Pulleys in flex and scaption approx 20 each Standing rows with GTB 2x15 Standing shoulder extension to neutral x 15 and x 10 Standing Jobe's flexion 1# x 10 Standing shoulder flexion with RTB in hands 2x10 Supine serratus punch 2x10 with 2# S/L ER with assistance and cuing for correct form  2x10 Supine ER with YTB x 10 reps Shelf reach (to 2nd shelf)  2x10  Pt received L shoulder PROM in flexion, abd, and ER in supine per pt and tissue tolerance  03/15/23 Pulley flexion/scaption 3 minutes Manual: grade II-III inferior glides in progressive flexion Supine shoulder horizontal abduction GTB 2 x 10 Supine shoulder bilateral ER GTB 3 x 10 Shoulder PNF D2  RTB 3 x 10  Standing  shoulder flexion with band in hands RTB 3 x 10 Standing shoulder abduction with perpendicular resistance YTB 2 x 10  Standing shoulder flexion with perpendicular resistance YTB 2 x 10  Standing shoulder flexion/press with manual assist 1# 2 x 10 Review of prior prone exercises- adding 2# weight Prone horizontal abduction 2 x 10    PATIENT EDUCATION: Education details: surgical and protocol limitations and restrictions, Dx, relevant anatomy, POC, HEP, and exercise form. Person educated: Patient Education method: Explanation, Demonstration, Tactile cues, Verbal cues Education comprehension:  verbalized understanding, returned demonstration, verbal cues required, tactile cues required, and needs further education   HOME EXERCISE PROGRAM: Access Code: [email or web address] URL: [email or web address] Date: 02/12/2023 Prepared by: Prentice Zaunegger  Exercises - Supine Shoulder Flexion Extension AAROM with Dowel  - 3 x daily - 7 x weekly - 2 sets - 10 reps - Supine Shoulder External Rotation with Dowel  - 3 x daily - 7 x weekly - 2 sets - 10 reps - Standing 'L' Stretch at Counter  - 3 x daily - 7 x weekly - 2 sets - 10 reps - 5-10 second hold - Supine Single Arm Scapular Protraction  - 1 x daily - 7 x weekly - 3 sets - 10 reps - Sidelying Shoulder Abduction Palm Forward (Mirrored)  - 1 x daily - 7 x weekly - 3 sets - 10 reps - Isometric Shoulder Flexion at Wall (Mirrored)  - 2 x daily - 7 x weekly - 5 reps - 10 second hold - Isometric Shoulder Abduction at Wall  - 5 x daily - 7 x weekly - 5 reps - 10 second hold - Isometric Shoulder Extension at Wall  - 2 x daily - 7 x weekly - 5 reps - 10 second hold - Standing Isometric Shoulder Internal Rotation at Doorway  - 2 x daily - 7 x weekly - 5 reps - 10 second hold - Isometric Shoulder External Rotation at Wall  - 2 x daily - 7 x weekly - 5 reps - 10 second hold - Supine Single Arm Shoulder Protraction  - 1 x daily - 7 x weekly - 3 sets - 10 reps - Prone Shoulder Extension - Single Arm (Mirrored)  - 1 x daily - 7 x weekly - 3 sets - 10 reps - Shoulder Flexion Wall Slide with Towel  - 1 x daily - 7 x weekly - 10 reps - Supine Shoulder Flexion AROM  - 2 x daily - 7 x weekly - 2 sets - 10 reps - Standing Shoulder Flexion Wall Walk  - 1-2 x daily - 7 x weekly - 1 sets - 5 reps - Standing Shoulder Row with Anchored Resistance  - 1 x daily - 7 x weekly - 3 sets - 10 reps - Supine Shoulder External Rotation with Resistance  - 1 x daily - 7 x weekly - 2 sets - 10 reps - Shoulder extension with resistance - Neutral  - 1 x daily - 7 x weekly - 2  sets - 10 reps  03/01/23- Shoulder Alphabet with Ball at [facility]  - 1 x daily - 7 x weekly - 2-3 reps  03/15/23- Prone Single Arm Shoulder Horizontal Abduction with Scapular Retraction and Palm Down  - 1 x daily - 7 x weekly - 3 sets - 10 reps 04/19/23 - Seated Shoulder External Rotation in Abduction Supported with Dumbbell (Mirrored)  - 1 x daily - 7 x weekly - 3 sets - 10 reps  ASSESSMENT:  CLINICAL IMPRESSION: Pt is improving with function as evidenced by subjective reports.  Pt continues to have difficulty with donning coat and donning a shirt overhead.  Pt demonstrates much improved form and ease with standing jobe's flexion.  Pt was able to perform a complete set with good form and decreased shoulder compensatory hike.  Pt was also able to perform jobe's flexion with 1# well today.  Pt performed exercises well and is improving with strength.  Pt performed standing functional IR AROM and reports being able to go further today.  She appeared to have improved ROM reaching behind her back based on visual observation.  She responded well to Rx reporting no pain after Rx.      OBJECTIVE IMPAIRMENTS: decreased activity tolerance, decreased endurance, decreased ROM, decreased strength, hypomobility, impaired flexibility, impaired UE functional use, and pain.    ACTIVITY LIMITATIONS: carrying, lifting, bathing, dressing, reach over head, and hygiene/grooming   PARTICIPATION LIMITATIONS: meal prep, cleaning, laundry, driving, shopping, and community activity   PERSONAL FACTORS: 3+ comorbidities: cervical spondylosis, osteoporosis, R shoulder pain  are also affecting patient's functional outcome.    REHAB POTENTIAL: Good   CLINICAL DECISION MAKING: Stable/uncomplicated   EVALUATION COMPLEXITY: Low     GOALS:   SHORT TERM GOALS:    Pt will be independent and compliant with HEP for improved pain, ROM, strength, and function  Baseline: Goal status: GOAL MET  Target date:  01/24/2023      2.  Pt will demo improved PROM to 110 deg in flexion and 30 deg in ER for improved shoulder mobility and tightness Baseline:  Goal status: GOAL MET  11/19 Target date:  01/17/2023       3.  Pt will demo L shoulder AAROM to 130 deg in flexion (supine) and 30 deg in ER for improved stiffness and mobility Baseline:  Goal status: GOAL MET  11/19 Target date:  01/31/2023     4.  Pt will be able to actively perform R shoulder flexion AROM to at least 90 deg in supine.  Baseline:  Goal status: GOAL MET  11/19 Target date:  01/24/2023     5.  Pt will be able to actively elevate R shoulder > 90 deg without significant shoulder hike.  Baseline:  Goal status:  PROGRESSING  1/4 Target date:  02/28/2023     6.  Pt will be able to perform her self care activities with no > than minimal difficulty.   Baseline:  Goal status:  GOAL MET  1/4 Target date:  03/21/2023     LONG TERM GOALS: Target date:   05/07/2023     Pt will be able to perform her ADLs and IADLs without significant difficulty and pain.  Baseline:  Goal status: PROGRESSING  1/4   2.   Pt will demo L shoulder AROM to be [facility] t/o for performance of ADLs and IADLs  Baseline:  Goal status: PROGRESSING  1/4   3.  Pt will be able to perform her normal reaching and functional overhead activities without significant limitation and pain. Baseline:  Goal status:  OINGOING  1/4   4.  Pt will be able to reach into an overhead cabinet without significant difficulty.  Baseline:  Goal status:  GOAL MET 1/4 Target date:  04/04/2023     5.  Pt will progress with strengthening exercises per protocol without adverse effects in order to perform her normal functional lifting/carrying activities and household chores.  Baseline:  Goal status: PROGRESSING  1/4       PLAN:   PT FREQUENCY: 2x/week   PT DURATION: 5 weeks   PLANNED INTERVENTIONS: Therapeutic exercises, Therapeutic activity, Neuromuscular re-education, Patient/Family  education, Self Care, Aquatic Therapy, Dry Needling, Electrical stimulation, Cryotherapy, Moist heat, Taping, Manual therapy, and Re-evaluation   PLAN FOR NEXT SESSION: Cont per Dr. Saundra RSA protocol.   Referring diagnosis? D53.987J (ICD-10-CM) - Traumatic complete tear of left rotator cuff, initial encounter   Treatment diagnosis? (if different than referring diagnosis)  Left shoulder pain, unspecified chronicity   Stiffness of left shoulder, not elsewhere classified   Muscle weakness (generalized)  What was this (referring dx) caused by? [x]  Surgery []  Fall []  Ongoing issue []  Arthritis []  Other: ____________  Laterality: []  Rt [x]  Lt []  Both  Check all possible CPT codes:  *CHOOSE 10 OR LESS*    See Planned Interventions listed in the Plan section of the Evaluation.    Leigh Minerva III PT, DPT 05/06/23 12:29 PM

## 2023-05-06 ENCOUNTER — Encounter (HOSPITAL_BASED_OUTPATIENT_CLINIC_OR_DEPARTMENT_OTHER): Payer: Self-pay | Admitting: Physical Therapy

## 2023-05-09 ENCOUNTER — Encounter (HOSPITAL_BASED_OUTPATIENT_CLINIC_OR_DEPARTMENT_OTHER): Payer: Self-pay | Admitting: Physical Therapy

## 2023-05-09 ENCOUNTER — Ambulatory Visit (HOSPITAL_BASED_OUTPATIENT_CLINIC_OR_DEPARTMENT_OTHER): Payer: Medicare HMO | Admitting: Physical Therapy

## 2023-05-09 DIAGNOSIS — M6281 Muscle weakness (generalized): Secondary | ICD-10-CM | POA: Diagnosis not present

## 2023-05-09 DIAGNOSIS — M25512 Pain in left shoulder: Secondary | ICD-10-CM

## 2023-05-09 DIAGNOSIS — M25612 Stiffness of left shoulder, not elsewhere classified: Secondary | ICD-10-CM | POA: Diagnosis not present

## 2023-05-09 NOTE — Therapy (Signed)
OUTPATIENT PHYSICAL THERAPY TREATMENT   Progress Note Reporting Period 04/05/23 to 05/09/23  See note below for Objective Data and Assessment of Progress/Goals.           Patient Name: Heidi Shah MRN: [id number] DOB:December 04, 1945, 78 y.o., female Today's Date: 05/10/2023  END OF SESSION:  PT End of Session - 05/09/23 1025     Visit Number 28    Number of Visits 35    Date for PT Re-Evaluation 06/20/23    Authorization Type human[employer] MCR    PT Start Time 1024    PT Stop Time 1112    PT Time Calculation (min) 48 min    Activity Tolerance Patient tolerated treatment well    Behavior During Therapy WFL for tasks assessed/performed                   Past Medical History:  Diagnosis Date   Allergy    Anxiety    Arthritis    Borderline glaucoma    Cataract    surgery - Bilateral cataracts removed   Cervical spondylolysis    Chicken pox    Colon polyps    Depression    Diverticulitis    Diverticulosis    Esophageal spasm    Esophageal stricture    Family history of adverse reaction to anesthesia    difficult time waking mother up   GERD (gastroesophageal reflux disease)    Heart murmur    Hypertension    Osteoporosis    Traumatic complete tear of left rotator cuff    Past Surgical History:  Procedure Laterality Date   cataract surgery Bilateral 1996   COLONOSCOPY  2022   EYE SURGERY  2000   detached L retina   HERNIA REPAIR Bilateral 1990   inguinal   NECK SURGERY     age 57-trimmed a muscle due to not being able to hold her head up   POLYPECTOMY  2022   REVERSE SHOULDER ARTHROPLASTY Left 12/16/2022   Procedure: LEFT REVERSE SHOULDER ARTHROPLASTY;  Surgeon: Huel Cote, MD;  Location: [facility];  Service: Orthopedics;  Laterality: Left;   SHOULDER ARTHROSCOPY WITH ROTATOR CUFF REPAIR AND OPEN BICEPS TENODESIS Left 12/10/2021   Procedure: LEFT SHOULDER ARTHROSCOPY WITH ROTATOR CUFF REPAIR AND OPEN BICEPS TENODESIS;  Surgeon: Huel Cote,  MD;  Location: [facility];  Service: Orthopedics;  Laterality: Left;   UPPER GASTROINTESTINAL ENDOSCOPY  2022   Patient Active Problem List   Diagnosis Date Noted   Traumatic complete tear of left rotator cuff    Cervical spondylosis with myelopathy and radiculopathy 03/14/2020   Neck pain 03/14/2020   Aortic heart murmur 02/25/2020   Age-related osteoporosis without current pathological fracture 04/04/2019   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   Constipation 03/05/2013   GERD (gastroesophageal reflux disease) 01/19/2013   Esophageal spasm 01/19/2013   Dysphagia 01/19/2013   Diverticulitis of colon (without mention of hemorrhage)(562.11) 11/23/2012   History of depression 11/23/2012   Essential hypertension, benign 11/23/2012     THERAPY DIAG:  Left shoulder pain, unspecified chronicity  Muscle weakness (generalized)  Stiffness of left shoulder, not elsewhere classified  REFERRING PROVIDER: Huel Cote, MD   REFERRING DIAG: S46.012A (ICD-10-CM) - Traumatic complete tear of left rotator cuff, initial encounter    S/p L RSA   THERAPY DIAG:  Left shoulder pain, unspecified chronicity   Stiffness of left shoulder, not elsewhere classified   Muscle weakness (generalized)   Rationale for Evaluation and Treatment: Rehabilitation   ONSET DATE: DOS  12/16/2022   SUBJECTIVE:                                                                                                                                                                                       SUBJECTIVE STATEMENT: Pt is 20 weeks s/p L RSA.  Pt denies any adverse effects after prior Rx.    Pt states she is able to reach into an overhead cabinet much, much better.  She still has some difficulty with reaching into an overhead cabinet and grabbing an object or putting away an objects.  Pt states she is using her L UE  more with her daily activities without thinking about it.  She is limited with reaching behind her back.  Pt is  limited with lifting objects.  Pt is performing her HEP.  Pt had a massage which she states helped.  Pt reports being able to perform her ADLs and IADLs without difficulty for the most part, and has slight difficulty with some activities.    Pt states she would like to decrease to 1x/wk.     Hand dominance: Right   PERTINENT HISTORY: -L shoulder reverse shoulder arthroplasty (RSA) and biceps tenodesis on 12/16/2022.  -L shoulder RCR and biceps tenodesis in 11/2021. -Arthritis, cervical spondylosis, osteoporosis, HTN -Pt states she does have 2 small tears in her R shoulder and does some pain in R shoulder.     PAIN:  NPRS:  0/10 current, 3/10 worst, 0/10 best Pt denies soreness.    PRECAUTIONS: Other: Tears and pain in R shoulder, osteoporosis       WEIGHT BEARING RESTRICTIONS: Yes L UE   FALLS:  Has patient fallen in last 6 months? No   LIVING ENVIRONMENT: Lives with: lives alone Lives in: 1 story home Stairs: has steps to enter home and bilat rails on one entrance     OCCUPATION: Pt is retired.   PLOF: Independent   PATIENT GOALS:  to regain 100% movement in arm and use it as normal     OBJECTIVE:    DIAGNOSTIC FINDINGS:  Pt is post op.      TODAY'S TREATMENT:  05/09/23 L shoulder AROM:                       Flexion:  145 (standing)   Scaption:  125 (standing)                       ER:  62                       IR:  58            PROM:                        Flex: 162  Strength:  tolerates minimal resistance in flexion and scaption  ER:  4+/5  Pulleys in flexion, scaption, and abd x 20 each Standing jobe's flexion x 8, 10 reps with 1# Standing scaption x10 with 1#, 0# x 10 reps Shelf reach x10 with 1#  Updated HEP and gave pt a HEP handout.  Educated pt in correct form and appropriate frequency.    FOTO:  02/15/23:  55   Prior/Current:  63 / 58.  Goal of 57.     05/05/23 Pulleys x 20 reps in flex, scaption, and abduction Standing slide with lift off x 10 Standing jobe's flexion 2x10, 1# x 10 Standing scaption 2x10 Shelf reach x10, 1# x 7 Seated shoulder ER at 70 deg 2# 2x10 4D ball rolls on wall 2x10 Standing functional IR AROM x10  Pt received L shoulder flexion, abd, ER, and IR PROM in supine per pt and tissue tolerance.     04/28/2023 PT answered questions concerning self STM with tennis ball STM to teres minor and medial scap mm  Pt received L shoulder flexion, abd, ER, and IR in supine.   Supine shoulder flexion 2# 2x10 Seated shoulder ER at 75-80 deg 2# 2x10 Standing slide with lift off 2 x 10 Standing scaption 2x10 4D ball rolls on wall x 10 each   04/26/23 Pulley  in flex, scaption, abd x20 each Manual: Grade II-III  GH inferior glides  in flexion; sidelying scapular mobs; prone IR mob grade II-III; STM Teres minor, infraspinatus; self STM with tennis ball  Shoulder AROM flexion 2 x 10 Seated shoulder ER at 90 with arm supported 2# 3 x 10 Seated arnold press 2 x 10 Standing slide with lift off 2 x 10 Cross body stretch 3 x 20 second holds  04/19/23 Pulley  in flex, scaption, abd x20 each Manual: Grade II-III  GH inferior glides  in flexion; sidelying scapular mobs; prone IR mob grade II-III Cross body stretch 3 x 20 second holds Supine shoulder PNF D2 GTB 3 x 10  Seated shoulder ER at 90 with arm supported 2# 3 x 10   04/14/2023 Reviewed pain levels, current function, HEP compliance, and response to prior Rx. Pt received L shoulder PROM in flexion, abd, ER, and IR in supine per pt and tissue tolerance.    Pulleys in flex, scaption, abd x20 each Supine serratus punch 2# 3x10 Supine shoulder flexion 2# 2x10 S/L shoulder abduction x10 S/L ER 2x10 Standing jobe's flexion 1# x10 reps Standing scaption 2x10 Shelf reach 2x10 to 2nd shelf Standing functional IR AROM  2x10 Standing ER with arm at side with YTB 2x10 4D ball rolls on wall x10 each   04/12/2023 Reviewed pain levels, current function, HEP compliance, and response to prior Rx. Pt received L  shoulder PROM in flexion, abd, ER, and IR in supine per pt and tissue tolerance.    Pulleys in flex, scaption, abd x20 each Supine serratus punch 2# 3x10 Supine shoulder flexion 1# x 10, 2# 2x10 S/L shoulder abduction 2x10 S/L ER 2x10 Shelf reach 2x10 to 2nd shelf Standing scaption 2x10 Standing functional IR AROM 2x10 Standing ER with arm a side with YTB 2x10   04/05/2023 Reviewed pain levels, current function, HEP compliance, and response to prior Rx. Pt received L shoulder PROM in flexion, abd, ER, and IR in supine per pt and tissue tolerance.     Pulleys in flex, scaption, abd x20 each Supine serratus punch 2# 3x10 Supine shoulder flexion 1# 2x10 S/L shoulder abduction 2x10 S/L ER 2x10 Shelf reach x10 to 2nd shelf Standing scaption 2x10 Standing shoulder flexion 1# 1 x 8 Standing shoulder extension with retraction with GTB 2x10 Standing ER with arm a side with YTB 2x10  03/31/2023  L shoulder ROM: AROM in standing:   Flexion:  128 with UT compensatory hike   Scaption:  100 deg with UT compensatory hike  AROM in supine:   Flexion:  147   ER:  60   IR:  54  PROM:    Flex: 158  FOTO:  Prior/Current:  55 / 63.  Goal of 57.  Pulleys in flex, scaption, and abd approx 20 each Supine flexion with 1# 2x10 S/L shoulder abduction 2x10 Supine serratus punch with 2#  3x10 with assistance and cuing for correct form Standing scaption 2x10 S/L ER with cuing for correct form  2x10 Prone hz abd 2x10 Shelf reach x10 to 2nd shelf  Reviewed HEP and answered questions educating pt with appropriate frequency.   PATIENT EDUCATION: Education details: surgical and protocol limitations and restrictions, Dx, objective findings, relevant anatomy, POC, HEP, and exercise form. Person educated:  Patient Education method: Explanation, Demonstration, Tactile cues, Verbal cues Education comprehension: verbalized understanding, returned demonstration, verbal cues required, tactile cues required, and needs further education   HOME EXERCISE PROGRAM: Access Code: [email or web address] URL: [email or web address] Date: 02/12/2023 Prepared by: Greig Castilla Zaunegger  Exercises - Supine Shoulder Flexion Extension AAROM with Dowel  - 3 x daily - 7 x weekly - 2 sets - 10 reps - Supine Shoulder External Rotation with Dowel  - 3 x daily - 7 x weekly - 2 sets - 10 reps - Standing 'L' Stretch at Counter  - 3 x daily - 7 x weekly - 2 sets - 10 reps - 5-10 second hold - Supine Single Arm Scapular Protraction  - 1 x daily - 7 x weekly - 3 sets - 10 reps - Sidelying Shoulder Abduction Palm Forward (Mirrored)  - 1 x daily - 7 x weekly - 3 sets - 10 reps - Isometric Shoulder Flexion at Wall (Mirrored)  - 2 x daily - 7 x weekly - 5 reps - 10 second hold - Isometric Shoulder Abduction at Wall  - 5 x daily - 7 x weekly - 5 reps - 10 second hold - Isometric Shoulder Extension at Wall  - 2 x daily - 7 x weekly - 5 reps - 10 second hold - Standing Isometric Shoulder Internal Rotation at Doorway  - 2 x daily - 7 x weekly - 5 reps - 10 second hold - Isometric Shoulder External Rotation at Wall  - 2 x daily - 7 x weekly - 5 reps - 10 second hold - Supine Single Arm Shoulder Protraction  - 1 x daily -  7 x weekly - 3 sets - 10 reps - Prone Shoulder Extension - Single Arm (Mirrored)  - 1 x daily - 7 x weekly - 3 sets - 10 reps - Shoulder Flexion Wall Slide with Towel  - 1 x daily - 7 x weekly - 10 reps - Supine Shoulder Flexion AROM  - 2 x daily - 7 x weekly - 2 sets - 10 reps - Standing Shoulder Flexion Wall Walk  - 1-2 x daily - 7 x weekly - 1 sets - 5 reps - Standing Shoulder Row with Anchored Resistance  - 1 x daily - 7 x weekly - 3 sets - 10 reps - Supine Shoulder External Rotation with Resistance  - 1 x daily - 7  x weekly - 2 sets - 10 reps - Shoulder extension with resistance - Neutral  - 1 x daily - 7 x weekly - 2 sets - 10 reps  03/01/23- Shoulder Alphabet with Ball at [facility]  - 1 x daily - 7 x weekly - 2-3 reps  03/15/23- Prone Single Arm Shoulder Horizontal Abduction with Scapular Retraction and Palm Down  - 1 x daily - 7 x weekly - 3 sets - 10 reps 04/19/23 - Seated Shoulder External Rotation in Abduction Supported with Dumbbell (Mirrored)  - 1 x daily - 7 x weekly - 3 sets - 10 reps  Updated HEP: - Standing Shoulder Scaption  - 1 x daily - 6 x weekly - 2 sets - 10 reps  ASSESSMENT:   CLINICAL IMPRESSION: Pt is making good progress in all areas.  She is improving with functional usage of L UE as evidenced by subjective reports.  Pt reports being able to perform her ADLs and IADLs without difficulty for the most part, and has slight difficulty with some activities.  Though pt has some difficulty reaching into an overhead cabinet especially grabbing objects, she reports much improved performance of reaching overhead into a cabinet.  Pt has made great progress with UE elevation and reduced shoulder compensatory hike.  She is limited with reaching behind her back and also has expected limitations with lifting objects.  Pt continues to have weakness in L shoulder though is improving.  She is progressing with exercises per protocol.  Pt has met all STG's and LTG's #1,4 and nearly met LTG #2.  Pt should benefit from cont skilled PT per protocol to maximize functional usage of L UE, improve strength and functional reaching, and finalize HEP.  Pt states she would like to decrease to 1x/wk and PT agrees.      OBJECTIVE IMPAIRMENTS: decreased activity tolerance, decreased endurance, decreased ROM, decreased strength, hypomobility, impaired flexibility, impaired UE functional use, and pain.    ACTIVITY LIMITATIONS: carrying, lifting, bathing, dressing, reach over head, and hygiene/grooming   PARTICIPATION  LIMITATIONS: meal prep, cleaning, laundry, driving, shopping, and community activity   PERSONAL FACTORS: 3+ comorbidities: cervical spondylosis, osteoporosis, R shoulder pain  are also affecting patient's functional outcome.    REHAB POTENTIAL: Good   CLINICAL DECISION MAKING: Stable/uncomplicated   EVALUATION COMPLEXITY: Low     GOALS:   SHORT TERM GOALS:    Pt will be independent and compliant with HEP for improved pain, ROM, strength, and function  Baseline: Goal status: GOAL MET  Target date:  01/24/2023     2.  Pt will demo improved PROM to 110 deg in flexion and 30 deg in ER for improved shoulder mobility and tightness Baseline:  Goal status: GOAL MET  11/19 Target date:  01/17/2023       3.  Pt will demo L shoulder AAROM to 130 deg in flexion (supine) and 30 deg in ER for improved stiffness and mobility Baseline:  Goal status: GOAL MET  11/19 Target date:  01/31/2023     4.  Pt will be able to actively perform R shoulder flexion AROM to at least 90 deg in supine.  Baseline:  Goal status: GOAL MET  11/19 Target date:  01/24/2023     5.  Pt will be able to actively elevate R shoulder > 90 deg without significant shoulder hike.  Baseline:  Goal status:  goal met  2/10 Target date:  02/28/2023     6.  Pt will be able to perform her self care activities with no > than minimal difficulty.   Baseline:  Goal status:  GOAL MET  1/4 Target date:  03/21/2023     LONG TERM GOALS: Target date:   06/20/2023     Pt will be able to perform her ADLs and IADLs without significant difficulty and pain.  Baseline:  Goal status:  GOAL MET  2/10   2.   Pt will demo L shoulder AROM to be [facility] t/o for performance of ADLs and IADLs  Baseline:  Goal status: 95% MET 2/10   3.  Pt will be able to perform her normal reaching and functional overhead activities without significant limitation and pain. Baseline:  Goal status:  PROGRESSING  2/10   4.  Pt will be able to reach into  an overhead cabinet without significant difficulty.  Baseline:  Goal status:  GOAL MET 1/4 Target date:  04/04/2023     5.  Pt will progress with strengthening exercises per protocol without adverse effects in order to perform her normal functional lifting/carrying activities and household chores.  Baseline:  Goal status: PROGRESSING   2/10       PLAN:   PT FREQUENCY: 1x/week   PT DURATION: 4-6 weeks   PLANNED INTERVENTIONS: Therapeutic exercises, Therapeutic activity, Neuromuscular re-education, Patient/Family education, Self Care, Aquatic Therapy, Dry Needling, Electrical stimulation, Cryotherapy, Moist heat, Taping, Manual therapy, and Re-evaluation   PLAN FOR NEXT SESSION: Cont per Dr. Suzan Nailer RSA protocol.   Referring diagnosis? Z61.096E (ICD-10-CM) - Traumatic complete tear of left rotator cuff, initial encounter   Treatment diagnosis? (if different than referring diagnosis)  Left shoulder pain, unspecified chronicity   Stiffness of left shoulder, not elsewhere classified   Muscle weakness (generalized)  What was this (referring dx) caused by? [x]  Surgery []  Fall []  Ongoing issue []  Arthritis []  Other: ____________  Laterality: []  Rt [x]  Lt []  Both  Check all possible CPT codes:  *CHOOSE 10 OR LESS*    See Planned Interventions listed in the Plan section of the Evaluation.    Audie Clear III PT, DPT 05/10/23 10:13 AM

## 2023-05-10 ENCOUNTER — Other Ambulatory Visit: Payer: Self-pay | Admitting: Family Medicine

## 2023-05-10 DIAGNOSIS — Z1231 Encounter for screening mammogram for malignant neoplasm of breast: Secondary | ICD-10-CM

## 2023-05-10 NOTE — Progress Notes (Unsigned)
[facility] [address] [place] [address] Phone: [phone] Subjective:   Heidi Shah, am serving as a scribe for Dr. Antoine Primas.  I'm seeing this patient by the request  of:  Kristian Covey, MD  CC: osteoporosis   WUX:[id number]  Heidi Shah is a 78 y.o. female coming in with complaint of bone issues. Patient states that she has some lower back pain but overall wants to discuss bone loss and how to mitigate it.    Bone density scheduled 5/15  Vitamin D 53 Patient did have a bone density in January of this year showing no the patient did have worsening bone mineral density again of the femoral neck with a value of -2.6  Past Medical History:  Diagnosis Date   Allergy    Anxiety    Arthritis    Borderline glaucoma    Cataract    surgery - Bilateral cataracts removed   Cervical spondylolysis    Chicken pox    Colon polyps    Depression    Diverticulitis    Diverticulosis    Esophageal spasm    Esophageal stricture    Family history of adverse reaction to anesthesia    difficult time waking mother up   GERD (gastroesophageal reflux disease)    Heart murmur    Hypertension    Osteoporosis    Traumatic complete tear of left rotator cuff    Past Surgical History:  Procedure Laterality Date   cataract surgery Bilateral 1996   COLONOSCOPY  2022   EYE SURGERY  2000   detached L retina   HERNIA REPAIR Bilateral 1990   inguinal   NECK SURGERY     age 60-trimmed a muscle due to not being able to hold her head up   POLYPECTOMY  2022   REVERSE SHOULDER ARTHROPLASTY Left 12/16/2022   Procedure: LEFT REVERSE SHOULDER ARTHROPLASTY;  Surgeon: Huel Cote, MD;  Location: [facility];  Service: Orthopedics;  Laterality: Left;   SHOULDER ARTHROSCOPY WITH ROTATOR CUFF REPAIR AND OPEN BICEPS TENODESIS Left 12/10/2021   Procedure: LEFT SHOULDER ARTHROSCOPY WITH ROTATOR CUFF REPAIR AND OPEN BICEPS TENODESIS;  Surgeon: Huel Cote, MD;  Location: [facility];  Service: Orthopedics;  Laterality: Left;   UPPER GASTROINTESTINAL ENDOSCOPY  2022   Social History   Socioeconomic History   Marital status: Divorced    Spouse name: Not on file   Number of children: 2   Years of education: master's degree   Highest education level: Master's degree (e.g., MA, MS, MEng, MEd, MSW, MBA)  Occupational History   Occupation: retired Runner, broadcasting/film/video  Tobacco Use   Smoking status: Never   Smokeless tobacco: Never  Vaping Use   Vaping status: Never Used  Substance and Sexual Activity   Alcohol use: Yes    Alcohol/week: 1.0 standard drink of alcohol    Types: 1 Glasses of wine per week    Comment: one or two glasses of wine a couple times a week   Drug use: No   Sexual activity: Not on file  Other Topics Concern   Not on file  Social History Narrative      Lives alone in ranch home.    2 children   Son lives in [place]   Continues to teach virtually part time   Social Drivers of [employer]: Low Risk  (01/18/2023)   Overall Financial Resource Strain (CARDIA)    Difficulty of  Paying Living Expenses: Not hard at all  Food Insecurity: No Food Insecurity (01/18/2023)   Hunger Vital Sign    Worried About Running Out of Food in the Last Year: Never true    Ran Out of Food in the Last Year: Never true  Transportation Needs: No Transportation Needs (01/18/2023)   PRAPARE - Administrator, Civil Service (Medical): No    Lack of Transportation (Non-Medical): No  Physical Activity: Unknown (01/18/2023)   Exercise Vital Sign    Days of Exercise per Week: 1 day    Minutes of Exercise per Session: Patient declined  Recent Concern: Physical Activity - Inactive (12/03/2022)   Exercise Vital Sign    Days of Exercise per Week: 0 days    Minutes of Exercise per Session: 60 min  Stress: No Stress Concern Present (01/18/2023)   [employer] of Occupational Health - Occupational Stress  Questionnaire    Feeling of Stress : Not at all  Social Connections: Moderately Integrated (01/18/2023)   Social Connection and Isolation Panel [NHANES]    Frequency of Communication with Friends and Family: Once a week    Frequency of Social Gatherings with Friends and Family: Twice a week    Attends Religious Services: More than 4 times per year    Active Member of [employer] or Organizations: Yes    Attends Engineer, structural: More than 4 times per year    Marital Status: Divorced   Allergies  Allergen Reactions   Nickel Sulfate [Nickel] Rash    Per skin test from dermatologist   Beeswax     Per skin test from dermatologist   Cetrimonium Chloride [Cetrimide]     Per skin test from dermatologist   Lactose Intolerance (Gi) Diarrhea   Methylisothiazolinone     Per skin test from dermatologist   Neomycin Sulfate [Neomycin]     Per skin test from dermatologist   Penicillins     Childhood Reaction    Propolis     Per skin test from dermatologist   Family History  Problem Relation Age of Onset   Heart disease Mother 53   COPD Mother    Crohn's disease Mother    Heart disease Father 80   Cancer Father        kidney   Colon cancer Neg Hx    Esophageal cancer Neg Hx    Stomach cancer Neg Hx    Rectal cancer Neg Hx     Current Outpatient Medications (Endocrine & Metabolic):    alendronate (FOSAMAX) 70 MG tablet, Take 1 tablet (70 mg total) by mouth every 7 (seven) days. Take with a full glass of water on an empty stomach.  Current Outpatient Medications (Cardiovascular):    amLODipine (NORVASC) 2.5 MG tablet, Take 1 tablet (2.5 mg total) by mouth daily. (Patient taking differently: Take 2.5 mg by mouth every morning.)   lisinopril-hydrochlorothiazide (ZESTORETIC) 20-12.5 MG tablet, Take 1 tablet by mouth daily. (Patient taking differently: Take 1 tablet by mouth every morning.)  Current Outpatient Medications (Respiratory):    cetirizine (ZYRTEC) 10 MG chewable  tablet, Chew 10 mg by mouth every morning.   Current Outpatient Medications (Hematological):    cyanocobalamin (VITAMIN B12) 1000 MCG tablet, Take 1,000 mcg by mouth daily.  Current Outpatient Medications (Other):    Biotin w/ Vitamins C & E (HAIR SKIN & NAILS GUMMIES PO), Take 2 each by mouth 4 (four) times a week.   Calcium Carb-Cholecalciferol (CALCIUM 600+D3 PO), Take 1  tablet by mouth daily.   Ginkgo Biloba 60 MG CAPS, Take 60 mg by mouth daily.   Multiple Vitamin (MULTI-VITAMIN) tablet, Take 1 tablet by mouth 4 (four) times a week.   omeprazole (PRILOSEC) 40 MG capsule, Take 1 capsule (40 mg total) by mouth daily. (Patient taking differently: Take 40 mg by mouth every morning.)   vitamin C (ASCORBIC ACID) 500 MG tablet, Take 500 mg by mouth 4 (four) times a week.   VYZULTA 0.024 % SOLN, Place 1 drop into both eyes at bedtime.   Reviewed prior external information including notes and imaging from  primary care provider As well as notes that were available from care everywhere and other healthcare systems.  Past medical history, social, surgical and family history all reviewed in electronic medical record.  No pertanent information unless stated regarding to the chief complaint.   Review of Systems:  No headache, visual changes, nausea, vomiting, diarrhea, constipation, dizziness, abdominal pain, skin rash, fevers, chills, night sweats, weight loss, swollen lymph nodes, body aches, joint swelling, chest pain, shortness of breath, mood changes. POSITIVE muscle aches  Objective  Blood pressure 128/78, pulse 90, height 4\' 10"  (1.473 m), weight 119 lb (54 kg), SpO2 98%.   General: No apparent distress alert and oriented x3 mood and affect normal, dressed appropriately.  HEENT: Pupils equal, extraocular movements intact  Respiratory: Patient's speak in full sentences and does not appear short of breath  Cardiovascular: No lower extremity edema, non tender, no erythema  Low back exam  does have some degenerative scoliosis noted.  Does have some mild tightness noted with Pearlean Brownie right greater than left.  Negative straight leg test noted.  Sitting comfortably otherwise. Shoulder exam shows does have some weakness of the rotator cuff noted on the left side.    Impression and Recommendations:    The above documentation has been reviewed and is accurate and complete Judi Saa, DO

## 2023-05-12 ENCOUNTER — Encounter (HOSPITAL_BASED_OUTPATIENT_CLINIC_OR_DEPARTMENT_OTHER): Payer: Medicare HMO | Admitting: Physical Therapy

## 2023-05-12 ENCOUNTER — Encounter: Payer: Self-pay | Admitting: Family Medicine

## 2023-05-12 ENCOUNTER — Ambulatory Visit: Payer: Medicare HMO | Admitting: Family Medicine

## 2023-05-12 VITALS — BP 128/78 | HR 90 | Ht <= 58 in | Wt 119.0 lb

## 2023-05-12 DIAGNOSIS — M810 Age-related osteoporosis without current pathological fracture: Secondary | ICD-10-CM

## 2023-05-12 NOTE — Assessment & Plan Note (Signed)
We discussed at great length about osteoporosis, the progression, as well as different medications.  Patient has done Prolia previously.  Has been off of it as well.  Is looking to go back to the Fosamax.  I do think potentially trying that for 2 years with patient just barely being in the osteoporotic aspect would be beneficial.  Discussed with patient at great length of other potential treatment options as well as different things such as physical therapy and vibration place that could be beneficial.  Patient wants to continue with all of these.  Will follow-up with me again in 3 months.  Total time with patient as well as reviewing bone density was 47 minutes

## 2023-05-12 NOTE — Patient Instructions (Signed)
OsteoStrong Vit D Calcium Recommend fossamax for 2 years Zantac 2x a day  Prilosec 2x a day for 2 weeks for up to 3 months See me in  2 months

## 2023-05-13 ENCOUNTER — Other Ambulatory Visit: Payer: Self-pay | Admitting: Family Medicine

## 2023-05-17 ENCOUNTER — Other Ambulatory Visit: Payer: Self-pay | Admitting: Internal Medicine

## 2023-05-17 ENCOUNTER — Encounter (HOSPITAL_BASED_OUTPATIENT_CLINIC_OR_DEPARTMENT_OTHER): Payer: Self-pay | Admitting: Physical Therapy

## 2023-05-17 ENCOUNTER — Ambulatory Visit (HOSPITAL_BASED_OUTPATIENT_CLINIC_OR_DEPARTMENT_OTHER): Payer: Medicare HMO | Admitting: Physical Therapy

## 2023-05-17 DIAGNOSIS — M6281 Muscle weakness (generalized): Secondary | ICD-10-CM

## 2023-05-17 DIAGNOSIS — K259 Gastric ulcer, unspecified as acute or chronic, without hemorrhage or perforation: Secondary | ICD-10-CM

## 2023-05-17 DIAGNOSIS — M25512 Pain in left shoulder: Secondary | ICD-10-CM

## 2023-05-17 DIAGNOSIS — M25612 Stiffness of left shoulder, not elsewhere classified: Secondary | ICD-10-CM

## 2023-05-17 NOTE — Therapy (Signed)
OUTPATIENT PHYSICAL THERAPY TREATMENT            Patient Name: Heidi Shah MRN: [id number] DOB:1945/09/04, 78 y.o., female Today's Date: 05/17/2023  END OF SESSION:  PT End of Session - 05/17/23 0945     Visit Number 29    Number of Visits 35    Date for PT Re-Evaluation 06/20/23    Authorization Type humana MCR    PT Start Time 0943   Pt arrived 13 mins late to treatment   PT Stop Time 1025    PT Time Calculation (min) 42 min    Activity Tolerance Patient tolerated treatment well    Behavior During Therapy WFL for tasks assessed/performed                    Past Medical History:  Diagnosis Date   Allergy    Anxiety    Arthritis    Borderline glaucoma    Cataract    surgery - Bilateral cataracts removed   Cervical spondylolysis    Chicken pox    Colon polyps    Depression    Diverticulitis    Diverticulosis    Esophageal spasm    Esophageal stricture    Family history of adverse reaction to anesthesia    difficult time waking mother up   GERD (gastroesophageal reflux disease)    Heart murmur    Hypertension    Osteoporosis    Traumatic complete tear of left rotator cuff    Past Surgical History:  Procedure Laterality Date   cataract surgery Bilateral 1996   COLONOSCOPY  2022   EYE SURGERY  2000   detached L retina   HERNIA REPAIR Bilateral 1990   inguinal   NECK SURGERY     age 27-trimmed a muscle due to not being able to hold her head up   POLYPECTOMY  2022   REVERSE SHOULDER ARTHROPLASTY Left 12/16/2022   Procedure: LEFT REVERSE SHOULDER ARTHROPLASTY;  Surgeon: Huel Cote, MD;  Location: [facility];  Service: Orthopedics;  Laterality: Left;   SHOULDER ARTHROSCOPY WITH ROTATOR CUFF REPAIR AND OPEN BICEPS TENODESIS Left 12/10/2021   Procedure: LEFT SHOULDER ARTHROSCOPY WITH ROTATOR CUFF REPAIR AND OPEN BICEPS TENODESIS;  Surgeon: Huel Cote, MD;  Location: [facility];  Service: Orthopedics;  Laterality: Left;   UPPER  GASTROINTESTINAL ENDOSCOPY  2022   Patient Active Problem List   Diagnosis Date Noted   Traumatic complete tear of left rotator cuff    Cervical spondylosis with myelopathy and radiculopathy 03/14/2020   Neck pain 03/14/2020   Aortic heart murmur 02/25/2020   Age-related osteoporosis without current pathological fracture 04/04/2019   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   Constipation 03/05/2013   GERD (gastroesophageal reflux disease) 01/19/2013   Esophageal spasm 01/19/2013   Dysphagia 01/19/2013   Diverticulitis of colon (without mention of hemorrhage)(562.11) 11/23/2012   History of depression 11/23/2012   Essential hypertension, benign 11/23/2012     THERAPY DIAG:  Left shoulder pain, unspecified chronicity  Muscle weakness (generalized)  Stiffness of left shoulder, not elsewhere classified  REFERRING PROVIDER: Huel Cote, MD   REFERRING DIAG: S46.012A (ICD-10-CM) - Traumatic complete tear of left rotator cuff, initial encounter    S/p L RSA   THERAPY DIAG:  Left shoulder pain, unspecified chronicity   Stiffness of left shoulder, not elsewhere classified   Muscle weakness (generalized)   Rationale for Evaluation and Treatment: Rehabilitation   ONSET DATE: DOS 12/16/2022   SUBJECTIVE:  SUBJECTIVE STATEMENT: Pt is 21 weeks and 5 days s/p L RSA.  Pt denies any adverse effects after prior Rx.  Pt denies pain currently.  Pt states it's feeling like it's getting back to normal. Pt states she was able to perform overhead shoulder motions with the kids at church that she could not perform prior.       Hand dominance: Right   PERTINENT HISTORY: -L shoulder reverse shoulder arthroplasty (RSA) and biceps tenodesis on 12/16/2022.  -L shoulder RCR and biceps tenodesis in 11/2021. -Arthritis,  cervical spondylosis, osteoporosis, HTN -Pt states she does have 2 small tears in her R shoulder and does some pain in R shoulder.     PAIN:  NPRS:  0/10 current, 3/10 worst, 0/10 best Pt denies soreness.    PRECAUTIONS: Other: Tears and pain in R shoulder, osteoporosis       WEIGHT BEARING RESTRICTIONS: Yes L UE   FALLS:  Has patient fallen in last 6 months? No   LIVING ENVIRONMENT: Lives with: lives alone Lives in: 1 story home Stairs: has steps to enter home and bilat rails on one entrance     OCCUPATION: Pt is retired.   PLOF: Independent   PATIENT GOALS:  to regain 100% movement in arm and use it as normal     OBJECTIVE:    DIAGNOSTIC FINDINGS:  Pt is post op.      TODAY'S TREATMENT:                                                                                                                                         05/17/23 Therapeutic Exercise: Pulleys in flexion, scaption, and abd x 20 each Standing jobe's flexion 3x10  1# Standing scaption 2x10  1# S/L ER with 2x8  1# Pt received L shoulder flexion, abduction, ER, and IR in supine   Neuro Re-ed Activities: 4D ball rolls on wall 2x10 ABC's with ball on wall x 1rep Standing D2 flexion 2x10  Therapeutic Activities: Shelf reach 2x10  1# Waist to chest reach 2x10  3#  Standing functional IR AROM 2x10   05/09/23 L shoulder AROM:                       Flexion:  145 (standing)   Scaption:  125 (standing)                       ER:  62                       IR:  58            PROM:                        Flex: 162  Strength:  tolerates minimal resistance in flexion and scaption  ER:  4+/5  Pulleys in flexion, scaption, and abd x 20 each Standing jobe's flexion x 8, 10 reps with 1# Standing scaption x10 with 1#, 0# x 10 reps Shelf reach x10 with 1#  Updated HEP and gave pt a HEP handout.  Educated pt in correct form and appropriate frequency.    FOTO:  02/15/23:  55  Prior/Current:  63 / 58.   Goal of 57.     05/05/23 Pulleys x 20 reps in flex, scaption, and abduction Standing slide with lift off x 10 Standing jobe's flexion 2x10, 1# x 10 Standing scaption 2x10 Shelf reach x10, 1# x 7 Seated shoulder ER at 70 deg 2# 2x10 4D ball rolls on wall 2x10 Standing functional IR AROM x10  Pt received L shoulder flexion, abd, ER, and IR PROM in supine per pt and tissue tolerance.     04/28/2023 PT answered questions concerning self STM with tennis ball STM to teres minor and medial scap mm  Pt received L shoulder flexion, abd, ER, and IR in supine.   Supine shoulder flexion 2# 2x10 Seated shoulder ER at 75-80 deg 2# 2x10 Standing slide with lift off 2 x 10 Standing scaption 2x10 4D ball rolls on wall x 10 each   04/26/23 Pulley  in flex, scaption, abd x20 each Manual: Grade II-III  GH inferior glides  in flexion; sidelying scapular mobs; prone IR mob grade II-III; STM Teres minor, infraspinatus; self STM with tennis ball  Shoulder AROM flexion 2 x 10 Seated shoulder ER at 90 with arm supported 2# 3 x 10 Seated arnold press 2 x 10 Standing slide with lift off 2 x 10 Cross body stretch 3 x 20 second holds  04/19/23 Pulley  in flex, scaption, abd x20 each Manual: Grade II-III  GH inferior glides  in flexion; sidelying scapular mobs; prone IR mob grade II-III Cross body stretch 3 x 20 second holds Supine shoulder PNF D2 GTB 3 x 10  Seated shoulder ER at 90 with arm supported 2# 3 x 10    PATIENT EDUCATION: Education details: PT answered pt's questions concerning HEP.  Dx, relevant anatomy, POC, HEP, and exercise form. Person educated: Patient Education method: Explanation, Demonstration, Tactile cues, Verbal cues Education comprehension: verbalized understanding, returned demonstration, verbal cues required, tactile cues required, and needs further education   HOME EXERCISE PROGRAM: Access Code: [email or web address] URL: [email or web address] Date:  02/12/2023 Prepared by: Greig Castilla Zaunegger  Exercises - Supine Shoulder Flexion Extension AAROM with Dowel  - 3 x daily - 7 x weekly - 2 sets - 10 reps - Supine Shoulder External Rotation with Dowel  - 3 x daily - 7 x weekly - 2 sets - 10 reps - Standing 'L' Stretch at Counter  - 3 x daily - 7 x weekly - 2 sets - 10 reps - 5-10 second hold - Supine Single Arm Scapular Protraction  - 1 x daily - 7 x weekly - 3 sets - 10 reps - Sidelying Shoulder Abduction Palm Forward (Mirrored)  - 1 x daily - 7 x weekly - 3 sets - 10 reps - Isometric Shoulder Flexion at Wall (Mirrored)  - 2 x daily - 7 x weekly - 5 reps - 10 second hold - Isometric Shoulder Abduction at Wall  - 5 x daily - 7 x weekly - 5 reps - 10 second hold - Isometric Shoulder Extension at Wall  - 2 x daily - 7 x weekly - 5 reps - 10 second hold - Standing Isometric  Shoulder Internal Rotation at Doorway  - 2 x daily - 7 x weekly - 5 reps - 10 second hold - Isometric Shoulder External Rotation at Wall  - 2 x daily - 7 x weekly - 5 reps - 10 second hold - Supine Single Arm Shoulder Protraction  - 1 x daily - 7 x weekly - 3 sets - 10 reps - Prone Shoulder Extension - Single Arm (Mirrored)  - 1 x daily - 7 x weekly - 3 sets - 10 reps - Shoulder Flexion Wall Slide with Towel  - 1 x daily - 7 x weekly - 10 reps - Supine Shoulder Flexion AROM  - 2 x daily - 7 x weekly - 2 sets - 10 reps - Standing Shoulder Flexion Wall Walk  - 1-2 x daily - 7 x weekly - 1 sets - 5 reps - Standing Shoulder Row with Anchored Resistance  - 1 x daily - 7 x weekly - 3 sets - 10 reps - Supine Shoulder External Rotation with Resistance  - 1 x daily - 7 x weekly - 2 sets - 10 reps - Shoulder extension with resistance - Neutral  - 1 x daily - 7 x weekly - 2 sets - 10 reps  03/01/23- Shoulder Alphabet with Ball at [facility]  - 1 x daily - 7 x weekly - 2-3 reps  03/15/23- Prone Single Arm Shoulder Horizontal Abduction with Scapular Retraction and Palm Down  - 1 x daily - 7 x  weekly - 3 sets - 10 reps 04/19/23 - Seated Shoulder External Rotation in Abduction Supported with Dumbbell (Mirrored)  - 1 x daily - 7 x weekly - 3 sets - 10 reps - Standing Shoulder Scaption  - 1 x daily - 6 x weekly - 2 sets - 10 reps  ASSESSMENT:   CLINICAL IMPRESSION: Pt is improving with function and ROM as evidenced by subjective reports.  PT progressed exercises with resistance and sets.  Pt demonstrates improved strength as evidenced by improved performance of exercises.  Pt demonstrates improved form and tolerance with resisted standing flexion and scaption.  Pt able to perform shelf reach with a 1# weight well.  Pt has improved with ER strength being able to perform S/L ER with a 1# weight.  She did become fatigued toward the end of the sets and PT had her stop at rep 8.  She responded well to Rx having no pain and no c/o's after Rx.  Pt should benefit from cont skilled PT per protocol to maximize functional usage of L UE, improve strength and functional reaching, and finalize HEP.     OBJECTIVE IMPAIRMENTS: decreased activity tolerance, decreased endurance, decreased ROM, decreased strength, hypomobility, impaired flexibility, impaired UE functional use, and pain.    ACTIVITY LIMITATIONS: carrying, lifting, bathing, dressing, reach over head, and hygiene/grooming   PARTICIPATION LIMITATIONS: meal prep, cleaning, laundry, driving, shopping, and community activity   PERSONAL FACTORS: 3+ comorbidities: cervical spondylosis, osteoporosis, R shoulder pain  are also affecting patient's functional outcome.    REHAB POTENTIAL: Good   CLINICAL DECISION MAKING: Stable/uncomplicated   EVALUATION COMPLEXITY: Low     GOALS:   SHORT TERM GOALS:    Pt will be independent and compliant with HEP for improved pain, ROM, strength, and function  Baseline: Goal status: GOAL MET  Target date:  01/24/2023     2.  Pt will demo improved PROM to 110 deg in flexion and 30 deg in ER for improved  shoulder mobility  and tightness Baseline:  Goal status: GOAL MET  11/19 Target date:  01/17/2023       3.  Pt will demo L shoulder AAROM to 130 deg in flexion (supine) and 30 deg in ER for improved stiffness and mobility Baseline:  Goal status: GOAL MET  11/19 Target date:  01/31/2023     4.  Pt will be able to actively perform R shoulder flexion AROM to at least 90 deg in supine.  Baseline:  Goal status: GOAL MET  11/19 Target date:  01/24/2023     5.  Pt will be able to actively elevate R shoulder > 90 deg without significant shoulder hike.  Baseline:  Goal status:  goal met  2/10 Target date:  02/28/2023     6.  Pt will be able to perform her self care activities with no > than minimal difficulty.   Baseline:  Goal status:  GOAL MET  1/4 Target date:  03/21/2023     LONG TERM GOALS: Target date:   06/20/2023     Pt will be able to perform her ADLs and IADLs without significant difficulty and pain.  Baseline:  Goal status:  GOAL MET  2/10   2.   Pt will demo L shoulder AROM to be [facility] t/o for performance of ADLs and IADLs  Baseline:  Goal status: 95% MET 2/10   3.  Pt will be able to perform her normal reaching and functional overhead activities without significant limitation and pain. Baseline:  Goal status:  PROGRESSING  2/10   4.  Pt will be able to reach into an overhead cabinet without significant difficulty.  Baseline:  Goal status:  GOAL MET 1/4 Target date:  04/04/2023     5.  Pt will progress with strengthening exercises per protocol without adverse effects in order to perform her normal functional lifting/carrying activities and household chores.  Baseline:  Goal status: PROGRESSING   2/10       PLAN:   PT FREQUENCY: 1x/week   PT DURATION: 4-6 weeks   PLANNED INTERVENTIONS: Therapeutic exercises, Therapeutic activity, Neuromuscular re-education, Patient/Family education, Self Care, Aquatic Therapy, Dry Needling, Electrical stimulation,  Cryotherapy, Moist heat, Taping, Manual therapy, and Re-evaluation   PLAN FOR NEXT SESSION: Cont per Dr. Suzan Nailer RSA protocol.   Audie Clear III PT, DPT 05/17/23 12:48 PM

## 2023-05-19 ENCOUNTER — Encounter (HOSPITAL_BASED_OUTPATIENT_CLINIC_OR_DEPARTMENT_OTHER): Payer: Medicare HMO | Admitting: Physical Therapy

## 2023-05-20 ENCOUNTER — Telehealth (HOSPITAL_BASED_OUTPATIENT_CLINIC_OR_DEPARTMENT_OTHER): Payer: Self-pay | Admitting: Orthopaedic Surgery

## 2023-05-20 NOTE — Telephone Encounter (Signed)
Patient wants to know if she has to have post op clarence to go  demist after her surgery.

## 2023-05-20 NOTE — Telephone Encounter (Signed)
Patient notified no abx needed after verbal discussion with Dr. Steward Drone

## 2023-05-24 ENCOUNTER — Encounter (HOSPITAL_BASED_OUTPATIENT_CLINIC_OR_DEPARTMENT_OTHER): Payer: Self-pay | Admitting: Physical Therapy

## 2023-05-24 ENCOUNTER — Ambulatory Visit (HOSPITAL_BASED_OUTPATIENT_CLINIC_OR_DEPARTMENT_OTHER): Payer: Medicare HMO | Admitting: Physical Therapy

## 2023-05-24 DIAGNOSIS — M25512 Pain in left shoulder: Secondary | ICD-10-CM | POA: Diagnosis not present

## 2023-05-24 DIAGNOSIS — M25612 Stiffness of left shoulder, not elsewhere classified: Secondary | ICD-10-CM

## 2023-05-24 DIAGNOSIS — M6281 Muscle weakness (generalized): Secondary | ICD-10-CM | POA: Diagnosis not present

## 2023-05-24 NOTE — Therapy (Signed)
 OUTPATIENT PHYSICAL THERAPY TREATMENT    Patient Name: Heidi Shah MRN: [id number] DOB:1946-03-26, 78 y.o., female Today's Date: 05/24/2023  END OF SESSION:  PT End of Session - 05/24/23 0934     Visit Number 30    Number of Visits 35    Date for PT Re-Evaluation 06/20/23    Authorization Type humana MCR    PT Start Time 7829    PT Stop Time 1014    PT Time Calculation (min) 40 min    Activity Tolerance Patient tolerated treatment well    Behavior During Therapy WFL for tasks assessed/performed                    Past Medical History:  Diagnosis Date   Allergy    Anxiety    Arthritis    Borderline glaucoma    Cataract    surgery - Bilateral cataracts removed   Cervical spondylolysis    Chicken pox    Colon polyps    Depression    Diverticulitis    Diverticulosis    Esophageal spasm    Esophageal stricture    Family history of adverse reaction to anesthesia    difficult time waking mother up   GERD (gastroesophageal reflux disease)    Heart murmur    Hypertension    Osteoporosis    Traumatic complete tear of left rotator cuff    Past Surgical History:  Procedure Laterality Date   cataract surgery Bilateral 1996   COLONOSCOPY  2022   EYE SURGERY  2000   detached L retina   HERNIA REPAIR Bilateral 1990   inguinal   NECK SURGERY     age 84-trimmed a muscle due to not being able to hold her head up   POLYPECTOMY  2022   REVERSE SHOULDER ARTHROPLASTY Left 12/16/2022   Procedure: LEFT REVERSE SHOULDER ARTHROPLASTY;  Surgeon: Huel Cote, MD;  Location: [facility];  Service: Orthopedics;  Laterality: Left;   SHOULDER ARTHROSCOPY WITH ROTATOR CUFF REPAIR AND OPEN BICEPS TENODESIS Left 12/10/2021   Procedure: LEFT SHOULDER ARTHROSCOPY WITH ROTATOR CUFF REPAIR AND OPEN BICEPS TENODESIS;  Surgeon: Huel Cote, MD;  Location: [facility];  Service: Orthopedics;  Laterality: Left;   UPPER GASTROINTESTINAL ENDOSCOPY  2022   Patient Active Problem List    Diagnosis Date Noted   Traumatic complete tear of left rotator cuff    Cervical spondylosis with myelopathy and radiculopathy 03/14/2020   Neck pain 03/14/2020   Aortic heart murmur 02/25/2020   Age-related osteoporosis without current pathological fracture 04/04/2019   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   Constipation 03/05/2013   GERD (gastroesophageal reflux disease) 01/19/2013   Esophageal spasm 01/19/2013   Dysphagia 01/19/2013   Diverticulitis of colon (without mention of hemorrhage)(562.11) 11/23/2012   History of depression 11/23/2012   Essential hypertension, benign 11/23/2012     THERAPY DIAG:  Left shoulder pain, unspecified chronicity  Muscle weakness (generalized)  Stiffness of left shoulder, not elsewhere classified  REFERRING PROVIDER: Huel Cote, MD   REFERRING DIAG: S46.012A (ICD-10-CM) - Traumatic complete tear of left rotator cuff, initial encounter    S/p L RSA   THERAPY DIAG:  Left shoulder pain, unspecified chronicity   Stiffness of left shoulder, not elsewhere classified   Muscle weakness (generalized)   Rationale for Evaluation and Treatment: Rehabilitation   ONSET DATE: DOS 12/16/2022   SUBJECTIVE:  SUBJECTIVE STATEMENT: Pt states  still trouble reaching into cabinets. HEP going well.    Hand dominance: Right   PERTINENT HISTORY: -L shoulder reverse shoulder arthroplasty (RSA) and biceps tenodesis on 12/16/2022.  -L shoulder RCR and biceps tenodesis in 11/2021. -Arthritis, cervical spondylosis, osteoporosis, HTN -Pt states she does have 2 small tears in her R shoulder and does some pain in R shoulder.     PAIN:  NPRS:  0/10 current, 3/10 worst, 0/10 best Pt denies soreness.    PRECAUTIONS: Other: Tears and pain in R shoulder, osteoporosis        WEIGHT BEARING RESTRICTIONS: Yes L UE   FALLS:  Has patient fallen in last 6 months? No   LIVING ENVIRONMENT: Lives with: lives alone Lives in: 1 story home Stairs: has steps to enter home and bilat rails on one entrance     OCCUPATION: Pt is retired.   PLOF: Independent   PATIENT GOALS:  to regain 100% movement in arm and use it as normal     OBJECTIVE:    DIAGNOSTIC FINDINGS:  Pt is post op.      TODAY'S TREATMENT:                                                                                                                                         05/24/23 Pulleys in flexion, scaption, and abd x 20 each Standing jobe's flexion 3x10  1# Standing shoulder flexion in prayer position 2 x 10  Standing shoulder flexion in prayer position  wall slides 2 x 10  Standing bicep curl GTB 2 x 10 Standing shoulder press 1# 3 x 10 Standing row BTB 2 x 10 Standing shoulder extension GTB   05/17/23 Therapeutic Exercise: Pulleys in flexion, scaption, and abd x 20 each Standing jobe's flexion 3x10  1# Standing scaption 2x10  1# S/L ER with 2x8  1# Pt received L shoulder flexion, abduction, ER, and IR in supine   Neuro Re-ed Activities: 4D ball rolls on wall 2x10 ABC's with ball on wall x 1rep Standing D2 flexion 2x10  Therapeutic Activities: Shelf reach 2x10  1# Waist to chest reach 2x10  3#  Standing functional IR AROM 2x10   05/09/23 L shoulder AROM:                       Flexion:  145 (standing)   Scaption:  125 (standing)                       ER:  62                       IR:  58            PROM:  Flex: 162  Strength:  tolerates minimal resistance in flexion and scaption  ER:  4+/5  Pulleys in flexion, scaption, and abd x 20 each Standing jobe's flexion x 8, 10 reps with 1# Standing scaption x10 with 1#, 0# x 10 reps Shelf reach x10 with 1#  Updated HEP and gave pt a HEP handout.  Educated pt in correct form and appropriate frequency.     FOTO:  02/15/23:  55  Prior/Current:  63 / 58.  Goal of 57.     05/05/23 Pulleys x 20 reps in flex, scaption, and abduction Standing slide with lift off x 10 Standing jobe's flexion 2x10, 1# x 10 Standing scaption 2x10 Shelf reach x10, 1# x 7 Seated shoulder ER at 70 deg 2# 2x10 4D ball rolls on wall 2x10 Standing functional IR AROM x10  Pt received L shoulder flexion, abd, ER, and IR PROM in supine per pt and tissue tolerance.     04/28/2023 PT answered questions concerning self STM with tennis ball STM to teres minor and medial scap mm  Pt received L shoulder flexion, abd, ER, and IR in supine.   Supine shoulder flexion 2# 2x10 Seated shoulder ER at 75-80 deg 2# 2x10 Standing slide with lift off 2 x 10 Standing scaption 2x10 4D ball rolls on wall x 10 each   04/26/23 Pulley  in flex, scaption, abd x20 each Manual: Grade II-III  GH inferior glides  in flexion; sidelying scapular mobs; prone IR mob grade II-III; STM Teres minor, infraspinatus; self STM with tennis ball  Shoulder AROM flexion 2 x 10 Seated shoulder ER at 90 with arm supported 2# 3 x 10 Seated arnold press 2 x 10 Standing slide with lift off 2 x 10 Cross body stretch 3 x 20 second holds  04/19/23 Pulley  in flex, scaption, abd x20 each Manual: Grade II-III  GH inferior glides  in flexion; sidelying scapular mobs; prone IR mob grade II-III Cross body stretch 3 x 20 second holds Supine shoulder PNF D2 GTB 3 x 10  Seated shoulder ER at 90 with arm supported 2# 3 x 10    PATIENT EDUCATION: Education details: PT answered pt's questions concerning HEP.  Dx, relevant anatomy, POC, HEP, and exercise form. Person educated: Patient Education method: Explanation, Demonstration, Tactile cues, Verbal cues Education comprehension: verbalized understanding, returned demonstration, verbal cues required, tactile cues required, and needs further education   HOME EXERCISE PROGRAM: Access Code: [email or web address] URL:  [email or web address] Date: 02/12/2023 Prepared by: Greig Castilla Raissa Dam  Exercises - Supine Shoulder Flexion Extension AAROM with Dowel  - 3 x daily - 7 x weekly - 2 sets - 10 reps - Supine Shoulder External Rotation with Dowel  - 3 x daily - 7 x weekly - 2 sets - 10 reps - Standing 'L' Stretch at Counter  - 3 x daily - 7 x weekly - 2 sets - 10 reps - 5-10 second hold - Supine Single Arm Scapular Protraction  - 1 x daily - 7 x weekly - 3 sets - 10 reps - Sidelying Shoulder Abduction Palm Forward (Mirrored)  - 1 x daily - 7 x weekly - 3 sets - 10 reps - Isometric Shoulder Flexion at Wall (Mirrored)  - 2 x daily - 7 x weekly - 5 reps - 10 second hold - Isometric Shoulder Abduction at Wall  - 5 x daily - 7 x weekly - 5 reps - 10 second hold - Isometric Shoulder Extension at Wall  - 2 x  daily - 7 x weekly - 5 reps - 10 second hold - Standing Isometric Shoulder Internal Rotation at Doorway  - 2 x daily - 7 x weekly - 5 reps - 10 second hold - Isometric Shoulder External Rotation at Wall  - 2 x daily - 7 x weekly - 5 reps - 10 second hold - Supine Single Arm Shoulder Protraction  - 1 x daily - 7 x weekly - 3 sets - 10 reps - Prone Shoulder Extension - Single Arm (Mirrored)  - 1 x daily - 7 x weekly - 3 sets - 10 reps - Shoulder Flexion Wall Slide with Towel  - 1 x daily - 7 x weekly - 10 reps - Supine Shoulder Flexion AROM  - 2 x daily - 7 x weekly - 2 sets - 10 reps - Standing Shoulder Flexion Wall Walk  - 1-2 x daily - 7 x weekly - 1 sets - 5 reps - Standing Shoulder Row with Anchored Resistance  - 1 x daily - 7 x weekly - 3 sets - 10 reps - Supine Shoulder External Rotation with Resistance  - 1 x daily - 7 x weekly - 2 sets - 10 reps - Shoulder extension with resistance - Neutral  - 1 x daily - 7 x weekly - 2 sets - 10 reps  03/01/23- Shoulder Alphabet with Ball at [facility]  - 1 x daily - 7 x weekly - 2-3 reps  03/15/23- Prone Single Arm Shoulder Horizontal Abduction with Scapular  Retraction and Palm Down  - 1 x daily - 7 x weekly - 3 sets - 10 reps 04/19/23 - Seated Shoulder External Rotation in Abduction Supported with Dumbbell (Mirrored)  - 1 x daily - 7 x weekly - 3 sets - 10 reps - Standing Shoulder Scaption  - 1 x daily - 6 x weekly - 2 sets - 10 reps  ASSESSMENT:   CLINICAL IMPRESSION: Patient continues to have some hike with overhead mobility but is demonstrating improving strength. Continued with shoulder strengthening which is tolerated well. Patient will continue to benefit from physical therapy in order to improve function and reduce impairment.      OBJECTIVE IMPAIRMENTS: decreased activity tolerance, decreased endurance, decreased ROM, decreased strength, hypomobility, impaired flexibility, impaired UE functional use, and pain.    ACTIVITY LIMITATIONS: carrying, lifting, bathing, dressing, reach over head, and hygiene/grooming   PARTICIPATION LIMITATIONS: meal prep, cleaning, laundry, driving, shopping, and community activity   PERSONAL FACTORS: 3+ comorbidities: cervical spondylosis, osteoporosis, R shoulder pain  are also affecting patient's functional outcome.    REHAB POTENTIAL: Good   CLINICAL DECISION MAKING: Stable/uncomplicated   EVALUATION COMPLEXITY: Low     GOALS:   SHORT TERM GOALS:    Pt will be independent and compliant with HEP for improved pain, ROM, strength, and function  Baseline: Goal status: GOAL MET  Target date:  01/24/2023     2.  Pt will demo improved PROM to 110 deg in flexion and 30 deg in ER for improved shoulder mobility and tightness Baseline:  Goal status: GOAL MET  11/19 Target date:  01/17/2023       3.  Pt will demo L shoulder AAROM to 130 deg in flexion (supine) and 30 deg in ER for improved stiffness and mobility Baseline:  Goal status: GOAL MET  11/19 Target date:  01/31/2023     4.  Pt will be able to actively perform R shoulder flexion AROM to at least 90 deg in  supine.  Baseline:  Goal  status: GOAL MET  11/19 Target date:  01/24/2023     5.  Pt will be able to actively elevate R shoulder > 90 deg without significant shoulder hike.  Baseline:  Goal status:  goal met  2/10 Target date:  02/28/2023     6.  Pt will be able to perform her self care activities with no > than minimal difficulty.   Baseline:  Goal status:  GOAL MET  1/4 Target date:  03/21/2023     LONG TERM GOALS: Target date:   06/20/2023     Pt will be able to perform her ADLs and IADLs without significant difficulty and pain.  Baseline:  Goal status:  GOAL MET  2/10   2.   Pt will demo L shoulder AROM to be [facility] t/o for performance of ADLs and IADLs  Baseline:  Goal status: 95% MET 2/10   3.  Pt will be able to perform her normal reaching and functional overhead activities without significant limitation and pain. Baseline:  Goal status:  PROGRESSING  2/10   4.  Pt will be able to reach into an overhead cabinet without significant difficulty.  Baseline:  Goal status:  GOAL MET 1/4 Target date:  04/04/2023     5.  Pt will progress with strengthening exercises per protocol without adverse effects in order to perform her normal functional lifting/carrying activities and household chores.  Baseline:  Goal status: PROGRESSING   2/10       PLAN:   PT FREQUENCY: 1x/week   PT DURATION: 4-6 weeks   PLANNED INTERVENTIONS: Therapeutic exercises, Therapeutic activity, Neuromuscular re-education, Patient/Family education, Self Care, Aquatic Therapy, Dry Needling, Electrical stimulation, Cryotherapy, Moist heat, Taping, Manual therapy, and Re-evaluation   PLAN FOR NEXT SESSION: Cont per Dr. Suzan Nailer RSA protocol.    Reola Mosher Enriqueta Augusta, PT 05/24/2023, 10:15 AM

## 2023-05-26 ENCOUNTER — Encounter (HOSPITAL_BASED_OUTPATIENT_CLINIC_OR_DEPARTMENT_OTHER): Payer: Medicare HMO | Admitting: Physical Therapy

## 2023-05-31 ENCOUNTER — Ambulatory Visit (HOSPITAL_BASED_OUTPATIENT_CLINIC_OR_DEPARTMENT_OTHER): Payer: Medicare HMO | Attending: Orthopaedic Surgery | Admitting: Physical Therapy

## 2023-05-31 ENCOUNTER — Encounter (HOSPITAL_BASED_OUTPATIENT_CLINIC_OR_DEPARTMENT_OTHER): Payer: Self-pay | Admitting: Physical Therapy

## 2023-05-31 DIAGNOSIS — H401131 Primary open-angle glaucoma, bilateral, mild stage: Secondary | ICD-10-CM | POA: Diagnosis not present

## 2023-05-31 DIAGNOSIS — M25512 Pain in left shoulder: Secondary | ICD-10-CM | POA: Diagnosis not present

## 2023-05-31 DIAGNOSIS — M6281 Muscle weakness (generalized): Secondary | ICD-10-CM | POA: Diagnosis not present

## 2023-05-31 DIAGNOSIS — M25612 Stiffness of left shoulder, not elsewhere classified: Secondary | ICD-10-CM | POA: Insufficient documentation

## 2023-05-31 NOTE — Therapy (Signed)
 OUTPATIENT PHYSICAL THERAPY TREATMENT    Patient Name: Heidi Shah MRN: [id number] DOB:08/25/1945, 78 y.o., female Today's Date: 06/01/2023  END OF SESSION:  PT End of Session - 05/31/23 1121     Visit Number 31    Number of Visits 35    Date for PT Re-Evaluation 06/20/23    Authorization Type humana MCR    PT Start Time 1115    PT Stop Time 1157    PT Time Calculation (min) 42 min    Activity Tolerance Patient tolerated treatment well    Behavior During Therapy WFL for tasks assessed/performed                     Past Medical History:  Diagnosis Date   Allergy    Anxiety    Arthritis    Borderline glaucoma    Cataract    surgery - Bilateral cataracts removed   Cervical spondylolysis    Chicken pox    Colon polyps    Depression    Diverticulitis    Diverticulosis    Esophageal spasm    Esophageal stricture    Family history of adverse reaction to anesthesia    difficult time waking mother up   GERD (gastroesophageal reflux disease)    Heart murmur    Hypertension    Osteoporosis    Traumatic complete tear of left rotator cuff    Past Surgical History:  Procedure Laterality Date   cataract surgery Bilateral 1996   COLONOSCOPY  2022   EYE SURGERY  2000   detached L retina   HERNIA REPAIR Bilateral 1990   inguinal   NECK SURGERY     age 18-trimmed a muscle due to not being able to hold her head up   POLYPECTOMY  2022   REVERSE SHOULDER ARTHROPLASTY Left 12/16/2022   Procedure: LEFT REVERSE SHOULDER ARTHROPLASTY;  Surgeon: Huel Cote, MD;  Location: [facility] ORS;  Service: Orthopedics;  Laterality: Left;   SHOULDER ARTHROSCOPY WITH ROTATOR CUFF REPAIR AND OPEN BICEPS TENODESIS Left 12/10/2021   Procedure: LEFT SHOULDER ARTHROSCOPY WITH ROTATOR CUFF REPAIR AND OPEN BICEPS TENODESIS;  Surgeon: Huel Cote, MD;  Location: [facility];  Service: Orthopedics;  Laterality: Left;   UPPER GASTROINTESTINAL ENDOSCOPY  2022   Patient Active Problem List    Diagnosis Date Noted   Traumatic complete tear of left rotator cuff    Cervical spondylosis with myelopathy and radiculopathy 03/14/2020   Neck pain 03/14/2020   Aortic heart murmur 02/25/2020   Age-related osteoporosis without current pathological fracture 04/04/2019   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   Constipation 03/05/2013   GERD (gastroesophageal reflux disease) 01/19/2013   Esophageal spasm 01/19/2013   Dysphagia 01/19/2013   Diverticulitis of colon (without mention of hemorrhage)(562.11) 11/23/2012   History of depression 11/23/2012   Essential hypertension, benign 11/23/2012     THERAPY DIAG:  Left shoulder pain, unspecified chronicity  Muscle weakness (generalized)  Stiffness of left shoulder, not elsewhere classified  REFERRING PROVIDER: Huel Cote, MD   REFERRING DIAG: S46.012A (ICD-10-CM) - Traumatic complete tear of left rotator cuff, initial encounter    S/p L RSA   THERAPY DIAG:  Left shoulder pain, unspecified chronicity   Stiffness of left shoulder, not elsewhere classified   Muscle weakness (generalized)   Rationale for Evaluation and Treatment: Rehabilitation   ONSET DATE: DOS 12/16/2022   SUBJECTIVE:  SUBJECTIVE STATEMENT: Pt states she is not having pain during the day though does have some pain at night which wakes her up occasionally.  Pt denies any adverse effects after prior Rx, but was fatigued.  Pt able to reach into cabinets though has trouble getting plates out.  Pt reports compliance with HEP.    Hand dominance: Right   PERTINENT HISTORY: -L shoulder reverse shoulder arthroplasty (RSA) and biceps tenodesis on 12/16/2022.  -L shoulder RCR and biceps tenodesis in 11/2021. -Arthritis, cervical spondylosis, osteoporosis, HTN -Pt states she does have 2  small tears in her R shoulder and does some pain in R shoulder.     PAIN:  NPRS:  0/10 current, 3/10 worst, 0/10 best Pt denies soreness.    PRECAUTIONS: Other: Tears and pain in R shoulder, osteoporosis       WEIGHT BEARING RESTRICTIONS: Yes L UE   FALLS:  Has patient fallen in last 6 months? No   LIVING ENVIRONMENT: Lives with: lives alone Lives in: 1 story home Stairs: has steps to enter home and bilat rails on one entrance     OCCUPATION: Pt is retired.   PLOF: Independent   PATIENT GOALS:  to regain 100% movement in arm and use it as normal     OBJECTIVE:    DIAGNOSTIC FINDINGS:  Pt is post op.      TODAY'S TREATMENT:                                                                                                                                         05/31/2023 Therapeutic Exercise: Pulleys in flexion, scaption, and abd x 20 each Standing jobe's flexion 3x10  1# Standing scaption 3x10  1# S/L ER with 1# x  8,10 reps PT reviewed HEP and answered pt's questions.     Neuro Re-ed Activities: 4D ball rolls on wall 2x10 ABC's with ball on wall x 1 rep Standing D2 flexion 2x10  Therapeutic Activities: Shelf reach 2x10  1# Waist to chest reach 3x10  3#  Standing functional IR AROM 2x10   05/24/23 Pulleys in flexion, scaption, and abd x 20 each Standing jobe's flexion 3x10  1# Standing shoulder flexion in prayer position 2 x 10  Standing shoulder flexion in prayer position  wall slides 2 x 10  Standing bicep curl GTB 2 x 10 Standing shoulder press 1# 3 x 10 Standing row BTB 2 x 10 Standing shoulder extension GTB   05/17/23 Therapeutic Exercise: Pulleys in flexion, scaption, and abd x 20 each Standing jobe's flexion 3x10  1# Standing scaption 2x10  1# S/L ER with 2x8  1# Pt received L shoulder flexion, abduction, ER, and IR in supine   Neuro Re-ed Activities: 4D ball rolls on wall 2x10 ABC's with ball on wall x 1rep Standing D2 flexion  2x10  Therapeutic Activities: Shelf reach 2x10  1# Waist to chest reach  2x10  3#  Standing functional IR AROM 2x10   05/09/23 L shoulder AROM:                       Flexion:  145 (standing)   Scaption:  125 (standing)                       ER:  62                       IR:  58            PROM:                        Flex: 162  Strength:  tolerates minimal resistance in flexion and scaption  ER:  4+/5  Pulleys in flexion, scaption, and abd x 20 each Standing jobe's flexion x 8, 10 reps with 1# Standing scaption x10 with 1#, 0# x 10 reps Shelf reach x10 with 1#  Updated HEP and gave pt a HEP handout.  Educated pt in correct form and appropriate frequency.    FOTO:  02/15/23:  55  Prior/Current:  63 / 58.  Goal of 57.     05/05/23 Pulleys x 20 reps in flex, scaption, and abduction Standing slide with lift off x 10 Standing jobe's flexion 2x10, 1# x 10 Standing scaption 2x10 Shelf reach x10, 1# x 7 Seated shoulder ER at 70 deg 2# 2x10 4D ball rolls on wall 2x10 Standing functional IR AROM x10  Pt received L shoulder flexion, abd, ER, and IR PROM in supine per pt and tissue tolerance.     04/28/2023 PT answered questions concerning self STM with tennis ball STM to teres minor and medial scap mm  Pt received L shoulder flexion, abd, ER, and IR in supine.   Supine shoulder flexion 2# 2x10 Seated shoulder ER at 75-80 deg 2# 2x10 Standing slide with lift off 2 x 10 Standing scaption 2x10 4D ball rolls on wall x 10 each   PATIENT EDUCATION: Education details: PT answered pt's questions concerning HEP.  Dx, relevant anatomy, POC, HEP, and exercise form. Person educated: Patient Education method: Explanation, Demonstration, Tactile cues, Verbal cues Education comprehension: verbalized understanding, returned demonstration, verbal cues required, tactile cues required, and needs further education   HOME EXERCISE PROGRAM: Access Code: [email or web address] URL:  [email or web address] Date: 02/12/2023 Prepared by: Greig Castilla Zaunegger   ASSESSMENT:   CLINICAL IMPRESSION: Patient continues to improve with UE elevation and shoulder strength.  When pt fatigues, her shoulder compensatory hike increases.  Pt required verbal, visual, and tactile cuing for correct form with standing scaption.  Pt has improved overall with reaching behind her back.  Pt is compliant with HEP.  PT answered pt's questions concerning her HEP including exercises and appropriate frequency.  Pt tolerated treatment well and had no pain after Rx. Pt should benefit from cont skilled PT to address impairments and ongoing goals and to improve function.       OBJECTIVE IMPAIRMENTS: decreased activity tolerance, decreased endurance, decreased ROM, decreased strength, hypomobility, impaired flexibility, impaired UE functional use, and pain.    ACTIVITY LIMITATIONS: carrying, lifting, bathing, dressing, reach over head, and hygiene/grooming   PARTICIPATION LIMITATIONS: meal prep, cleaning, laundry, driving, shopping, and community activity   PERSONAL FACTORS: 3+ comorbidities: cervical spondylosis, osteoporosis, R shoulder pain  are also affecting patient's functional outcome.    REHAB POTENTIAL: Good  CLINICAL DECISION MAKING: Stable/uncomplicated   EVALUATION COMPLEXITY: Low     GOALS:   SHORT TERM GOALS:    Pt will be independent and compliant with HEP for improved pain, ROM, strength, and function  Baseline: Goal status: GOAL MET  Target date:  01/24/2023     2.  Pt will demo improved PROM to 110 deg in flexion and 30 deg in ER for improved shoulder mobility and tightness Baseline:  Goal status: GOAL MET  11/19 Target date:  01/17/2023       3.  Pt will demo L shoulder AAROM to 130 deg in flexion (supine) and 30 deg in ER for improved stiffness and mobility Baseline:  Goal status: GOAL MET  11/19 Target date:  01/31/2023     4.  Pt will be able to  actively perform R shoulder flexion AROM to at least 90 deg in supine.  Baseline:  Goal status: GOAL MET  11/19 Target date:  01/24/2023     5.  Pt will be able to actively elevate R shoulder > 90 deg without significant shoulder hike.  Baseline:  Goal status:  goal met  2/10 Target date:  02/28/2023     6.  Pt will be able to perform her self care activities with no > than minimal difficulty.   Baseline:  Goal status:  GOAL MET  1/4 Target date:  03/21/2023     LONG TERM GOALS: Target date:   06/20/2023     Pt will be able to perform her ADLs and IADLs without significant difficulty and pain.  Baseline:  Goal status:  GOAL MET  2/10   2.   Pt will demo L shoulder AROM to be [facility] t/o for performance of ADLs and IADLs  Baseline:  Goal status: 95% MET 2/10   3.  Pt will be able to perform her normal reaching and functional overhead activities without significant limitation and pain. Baseline:  Goal status:  PROGRESSING  2/10   4.  Pt will be able to reach into an overhead cabinet without significant difficulty.  Baseline:  Goal status:  GOAL MET 1/4 Target date:  04/04/2023     5.  Pt will progress with strengthening exercises per protocol without adverse effects in order to perform her normal functional lifting/carrying activities and household chores.  Baseline:  Goal status: PROGRESSING   2/10       PLAN:   PT FREQUENCY: 1x/week   PT DURATION: 4-6 weeks   PLANNED INTERVENTIONS: Therapeutic exercises, Therapeutic activity, Neuromuscular re-education, Patient/Family education, Self Care, Aquatic Therapy, Dry Needling, Electrical stimulation, Cryotherapy, Moist heat, Taping, Manual therapy, and Re-evaluation   PLAN FOR NEXT SESSION: Cont per Dr. Suzan Nailer RSA protocol.    Audie Clear III PT, DPT 06/01/23 6:12 PM

## 2023-06-02 ENCOUNTER — Encounter (HOSPITAL_BASED_OUTPATIENT_CLINIC_OR_DEPARTMENT_OTHER): Payer: Medicare HMO | Admitting: Physical Therapy

## 2023-06-06 ENCOUNTER — Encounter (HOSPITAL_BASED_OUTPATIENT_CLINIC_OR_DEPARTMENT_OTHER): Payer: Medicare HMO | Admitting: Orthopaedic Surgery

## 2023-06-07 ENCOUNTER — Encounter (HOSPITAL_BASED_OUTPATIENT_CLINIC_OR_DEPARTMENT_OTHER): Payer: Medicare HMO | Admitting: Physical Therapy

## 2023-06-09 ENCOUNTER — Encounter (HOSPITAL_BASED_OUTPATIENT_CLINIC_OR_DEPARTMENT_OTHER): Payer: Medicare HMO | Admitting: Physical Therapy

## 2023-06-10 ENCOUNTER — Ambulatory Visit (HOSPITAL_BASED_OUTPATIENT_CLINIC_OR_DEPARTMENT_OTHER): Payer: Medicare HMO | Admitting: Physical Therapy

## 2023-06-10 ENCOUNTER — Ambulatory Visit: Payer: Medicare HMO

## 2023-06-10 ENCOUNTER — Ambulatory Visit (HOSPITAL_BASED_OUTPATIENT_CLINIC_OR_DEPARTMENT_OTHER)

## 2023-06-10 ENCOUNTER — Ambulatory Visit (HOSPITAL_BASED_OUTPATIENT_CLINIC_OR_DEPARTMENT_OTHER): Payer: Medicare HMO | Admitting: Orthopaedic Surgery

## 2023-06-10 ENCOUNTER — Encounter (HOSPITAL_BASED_OUTPATIENT_CLINIC_OR_DEPARTMENT_OTHER): Payer: Self-pay | Admitting: Physical Therapy

## 2023-06-10 VITALS — BP 120/62 | HR 89 | Temp 98.0°F | Ht <= 58 in | Wt 116.9 lb

## 2023-06-10 DIAGNOSIS — M6281 Muscle weakness (generalized): Secondary | ICD-10-CM | POA: Diagnosis not present

## 2023-06-10 DIAGNOSIS — Z96612 Presence of left artificial shoulder joint: Secondary | ICD-10-CM

## 2023-06-10 DIAGNOSIS — M25612 Stiffness of left shoulder, not elsewhere classified: Secondary | ICD-10-CM | POA: Diagnosis not present

## 2023-06-10 DIAGNOSIS — M19012 Primary osteoarthritis, left shoulder: Secondary | ICD-10-CM | POA: Diagnosis not present

## 2023-06-10 DIAGNOSIS — Z0000 Encounter for general adult medical examination without abnormal findings: Secondary | ICD-10-CM

## 2023-06-10 DIAGNOSIS — M25512 Pain in left shoulder: Secondary | ICD-10-CM

## 2023-06-10 NOTE — Progress Notes (Signed)
 Post Operative Evaluation    Procedure/Date of Surgery: Left shoulder reverse shoulder arthroplasty 9/19  Interval History:   Presents today for follow-up of the left shoulder.  She is overall doing very well with excellent range of motion.   PMH/PSH/Family History/Social History/Meds/Allergies:    Past Medical History:  Diagnosis Date   Allergy    Anxiety    Arthritis    Borderline glaucoma    Cataract    surgery - Bilateral cataracts removed   Cervical spondylolysis    Chicken pox    Colon polyps    Depression    Diverticulitis    Diverticulosis    Esophageal spasm    Esophageal stricture    Family history of adverse reaction to anesthesia    difficult time waking mother up   GERD (gastroesophageal reflux disease)    Heart murmur    Hypertension    Osteoporosis    Traumatic complete tear of left rotator cuff    Past Surgical History:  Procedure Laterality Date   cataract surgery Bilateral 1996   COLONOSCOPY  2022   EYE SURGERY  2000   detached L retina   HERNIA REPAIR Bilateral 1990   inguinal   NECK SURGERY     age 63-trimmed a muscle due to not being able to hold her head up   POLYPECTOMY  2022   REVERSE SHOULDER ARTHROPLASTY Left 12/16/2022   Procedure: LEFT REVERSE SHOULDER ARTHROPLASTY;  Surgeon: Huel Cote, MD;  Location: [facility];  Service: Orthopedics;  Laterality: Left;   SHOULDER ARTHROSCOPY WITH ROTATOR CUFF REPAIR AND OPEN BICEPS TENODESIS Left 12/10/2021   Procedure: LEFT SHOULDER ARTHROSCOPY WITH ROTATOR CUFF REPAIR AND OPEN BICEPS TENODESIS;  Surgeon: Huel Cote, MD;  Location: [facility];  Service: Orthopedics;  Laterality: Left;   UPPER GASTROINTESTINAL ENDOSCOPY  2022   Social History   Socioeconomic History   Marital status: Divorced    Spouse name: Not on file   Number of children: 2   Years of education: master's degree   Highest education level: Master's degree (e.g., MA, MS, MEng, MEd, MSW,  MBA)  Occupational History   Occupation: retired Runner, broadcasting/film/video  Tobacco Use   Smoking status: Never   Smokeless tobacco: Never  Vaping Use   Vaping status: Never Used  Substance and Sexual Activity   Alcohol use: Yes    Alcohol/week: 1.0 standard drink of alcohol    Types: 1 Glasses of wine per week    Comment: one or two glasses of wine a couple times a week   Drug use: No   Sexual activity: Not on file  Other Topics Concern   Not on file  Social History Narrative      Lives alone in ranch home.    2 children   Son lives in [place]   Continues to teach virtually part time   Social Drivers of Health   Financial Resource Strain: Low Risk  (06/10/2023)   Overall Financial Resource Strain (CARDIA)    Difficulty of Paying Living Expenses: Not hard at all  Food Insecurity: No Food Insecurity (06/10/2023)   Hunger Vital Sign    Worried About Running Out of Food in the Last Year: Never true    Ran Out of Food in the Last Year: Never true  Transportation Needs: No Transportation  Needs (06/10/2023)   PRAPARE - Administrator, Civil Service (Medical): No    Lack of Transportation (Non-Medical): No  Physical Activity: Sufficiently Active (06/10/2023)   Exercise Vital Sign    Days of Exercise per Week: 3 days    Minutes of Exercise per Session: 60 min  Stress: No Stress Concern Present (06/10/2023)   [employer] of Occupational Health - Occupational Stress Questionnaire    Feeling of Stress : Not at all  Social Connections: Moderately Integrated (06/10/2023)   Social Connection and Isolation Panel [NHANES]    Frequency of Communication with Friends and Family: More than three times a week    Frequency of Social Gatherings with Friends and Family: More than three times a week    Attends Religious Services: More than 4 times per year    Active Member of [employer] or Organizations: Yes    Attends Engineer, structural: More than 4 times per year    Marital Status:  Divorced   Family History  Problem Relation Age of Onset   Heart disease Mother 91   COPD Mother    Crohn's disease Mother    Heart disease Father 7   Cancer Father        kidney   Colon cancer Neg Hx    Esophageal cancer Neg Hx    Stomach cancer Neg Hx    Rectal cancer Neg Hx    Allergies  Allergen Reactions   Nickel Sulfate [Nickel] Rash    Per skin test from dermatologist   Beeswax     Per skin test from dermatologist   Cetrimonium Chloride [Cetrimide]     Per skin test from dermatologist   Lactose Intolerance (Gi) Diarrhea   Methylisothiazolinone     Per skin test from dermatologist   Neomycin Sulfate [Neomycin]     Per skin test from dermatologist   Penicillins     Childhood Reaction    Propolis     Per skin test from dermatologist   Current Outpatient Medications  Medication Sig Dispense Refill   alendronate (FOSAMAX) 70 MG tablet Take 1 tablet (70 mg total) by mouth every 7 (seven) days. Take with a full glass of water on an empty stomach. 4 tablet 11   amLODipine (NORVASC) 2.5 MG tablet TAKE ONE TABLET BY MOUTH ONE TIME DAILY 90 tablet 3   Biotin w/ Vitamins C & E (HAIR SKIN & NAILS GUMMIES PO) Take 2 each by mouth 4 (four) times a week.     Calcium Carb-Cholecalciferol (CALCIUM 600+D3 PO) Take 1 tablet by mouth daily.     cetirizine (ZYRTEC) 10 MG chewable tablet Chew 10 mg by mouth every morning.     cyanocobalamin (VITAMIN B12) 1000 MCG tablet Take 1,000 mcg by mouth daily.     Ginkgo Biloba 60 MG CAPS Take 60 mg by mouth daily.     lisinopril-hydrochlorothiazide (ZESTORETIC) 20-12.5 MG tablet TAKE ONE TABLET BY MOUTH ONE TIME DAILY 90 tablet 3   Multiple Vitamin (MULTI-VITAMIN) tablet Take 1 tablet by mouth 4 (four) times a week.     omeprazole (PRILOSEC) 40 MG capsule TAKE ONE CAPSULE BY MOUTH ONE TIME DAILY 90 capsule 1   vitamin C (ASCORBIC ACID) 500 MG tablet Take 500 mg by mouth 4 (four) times a week.     VYZULTA 0.024 % SOLN Place 1 drop into both eyes  at bedtime.     No current facility-administered medications for this visit.   No results  found.   Review of Systems:   A ROS was performed including pertinent positives and negatives as documented in the HPI.   Musculoskeletal Exam:    There were no vitals taken for this visit.  Left incision is well-appearing without erythema or drainage.  In the spine position she is able to forward elevate to 135 with external rotation at the side to 45 actively.  Internal rotation to back pocket.  Distal neurosensory exam is intact  Right shoulder with tenderness to palpation about the lateral deltoid.  She has weakness 4 out of 5 with forward elevation.  Negative belly press.  External rotation at the side is to 45 bilaterally.  Forward elevation is to 170 with pain and weakness on the right.  Positive Neer impingement  Imaging:    3 views right shoulder: Status post left reverse shoulder arthroplasty without evidence of complication  MRI right shoulder: There is intrasubstance tearing of the rotator cuff without a discrete tear.,  Significant fluid around the biceps consistent with biceps tearing thickened coracoacromial ligament consistent subacromial impingement  I personally reviewed and interpreted the radiographs.   Assessment:   78 year old female with left shoulder arthroplasty overall doing extremely well.  X-rays today show no evidence of complication.  This time I will plan to see her back as needed as she did quite well with a right shoulder injection  Plan :    -Plan for follow-up as needed     I personally saw and evaluated the patient, and participated in the management and treatment plan.  Huel Cote, MD Attending Physician, Orthopedic Surgery  This document was dictated using Dragon voice recognition software. A reasonable attempt at proof reading has been made to minimize errors.

## 2023-06-10 NOTE — Therapy (Signed)
 OUTPATIENT PHYSICAL THERAPY TREATMENT    Patient Name: Heidi Shah MRN: [id number] DOB:08/12/1945, 78 y.o., female Today's Date: 06/10/2023  END OF SESSION:  PT End of Session - 06/10/23 0849     Visit Number 32    Number of Visits 35    Date for PT Re-Evaluation 06/20/23    Authorization Type humana MCR    PT Start Time 0849    PT Stop Time 0935    PT Time Calculation (min) 46 min    Activity Tolerance Patient tolerated treatment well    Behavior During Therapy WFL for tasks assessed/performed                      Past Medical History:  Diagnosis Date   Allergy    Anxiety    Arthritis    Borderline glaucoma    Cataract    surgery - Bilateral cataracts removed   Cervical spondylolysis    Chicken pox    Colon polyps    Depression    Diverticulitis    Diverticulosis    Esophageal spasm    Esophageal stricture    Family history of adverse reaction to anesthesia    difficult time waking mother up   GERD (gastroesophageal reflux disease)    Heart murmur    Hypertension    Osteoporosis    Traumatic complete tear of left rotator cuff    Past Surgical History:  Procedure Laterality Date   cataract surgery Bilateral 1996   COLONOSCOPY  2022   EYE SURGERY  2000   detached L retina   HERNIA REPAIR Bilateral 1990   inguinal   NECK SURGERY     age 31-trimmed a muscle due to not being able to hold her head up   POLYPECTOMY  2022   REVERSE SHOULDER ARTHROPLASTY Left 12/16/2022   Procedure: LEFT REVERSE SHOULDER ARTHROPLASTY;  Surgeon: Huel Cote, MD;  Location: [facility] OR[facility];  Service: Orthopedics;  Laterality: Left;   SHOULDER ARTHROSCOPY WITH ROTATOR CUFF REPAIR AND OPEN BICEPS TENODESIS Left 12/10/2021   Procedure: LEFT SHOULDER ARTHROSCOPY WITH ROTATOR CUFF REPAIR AND OPEN BICEPS TENODESIS;  Surgeon: Huel Cote, MD;  Location: [facility];  Service: Orthopedics;  Laterality: Left;   UPPER GASTROINTESTINAL ENDOSCOPY  2022   Patient Active Problem  List   Diagnosis Date Noted   Traumatic complete tear of left rotator cuff    Cervical spondylosis with myelopathy and radiculopathy 03/14/2020   Neck pain 03/14/2020   Aortic heart murmur 02/25/2020   Age-related osteoporosis without current pathological fracture 04/04/2019   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   Constipation 03/05/2013   GERD (gastroesophageal reflux disease) 01/19/2013   Esophageal spasm 01/19/2013   Dysphagia 01/19/2013   Diverticulitis of colon (without mention of hemorrhage)(562.11) 11/23/2012   History of depression 11/23/2012   Essential hypertension, benign 11/23/2012     THERAPY DIAG:  Left shoulder pain, unspecified chronicity  Muscle weakness (generalized)  Stiffness of left shoulder, not elsewhere classified  REFERRING PROVIDER: Huel Cote, MD   REFERRING DIAG: S46.012A (ICD-10-CM) - Traumatic complete tear of left rotator cuff, initial encounter    S/p L RSA   THERAPY DIAG:  Left shoulder pain, unspecified chronicity   Stiffness of left shoulder, not elsewhere classified   Muscle weakness (generalized)   Rationale for Evaluation and Treatment: Rehabilitation   ONSET DATE: DOS 12/16/2022   SUBJECTIVE:  SUBJECTIVE STATEMENT: Pt is nearly 6 months post op.  She states she felt good after prior Rx.  She denies pain currently.  Pt states her arm is not were she can do all the things she needs to do.  Pt still has difficulty with putting a plate in an overhead cabinet and also taking a plate out of the cabinet.  Pt reports compliance with HEP.  Pt reports improved ability to reach behind her back.    Hand dominance: Right   PERTINENT HISTORY: -L shoulder reverse shoulder arthroplasty (RSA) and biceps tenodesis on 12/16/2022.  -L shoulder RCR and biceps tenodesis in  11/2021. -Arthritis, cervical spondylosis, osteoporosis, HTN -Pt states she does have 2 small tears in her R shoulder and does some pain in R shoulder.     PAIN:  NPRS:  0/10 current, 3/10 worst, 0/10 best Pt denies soreness.    PRECAUTIONS: Other: Tears and pain in R shoulder, osteoporosis       WEIGHT BEARING RESTRICTIONS: Yes L UE   FALLS:  Has patient fallen in last 6 months? No   LIVING ENVIRONMENT: Lives with: lives alone Lives in: 1 story home Stairs: has steps to enter home and bilat rails on one entrance     OCCUPATION: Pt is retired.   PLOF: Independent   PATIENT GOALS:  to regain 100% movement in arm and use it as normal     OBJECTIVE:    DIAGNOSTIC FINDINGS:  Pt is post op.      TODAY'S TREATMENT:                                                                                                                                         06/10/23 Therapeutic Exercise: Pulleys in flexion, scaption, and abd x 20 each Standing jobe's flexion 2x10 1#, 2# x 5 reps Standing scaption 3x10  1# 4D ball rolls on wall 2x10    Shoulder AROM:  R/L:  168/144 deg Pt still has shoulder hike with increased UE elevation though has improved.  Pt received L shoulder PROM in flexion, abduction, ER, and IR in supine.  Updated HEP and gave pt a HEP handout.  PT educated pt in correct form and appropriate frequency.     Therapeutic Activities: Shelf reach 3x10  1# Waist to chest reach x10  3#, 2x10  4#  Standing functional IR AROM 2x10   05/31/2023 Therapeutic Exercise: Pulleys in flexion, scaption, and abd x 20 each Standing jobe's flexion 3x10  1# Standing scaption 3x10  1# S/L ER with 1# x  8,10 reps PT reviewed HEP and answered pt's questions.     Neuro Re-ed Activities: 4D ball rolls on wall 2x10 ABC's with ball on wall x 1 rep Standing D2 flexion 2x10  Therapeutic Activities: Shelf reach 2x10  1# Waist to chest reach 3x10  3#  Standing functional IR AROM  2x10  05/24/23 Pulleys in flexion, scaption, and abd x 20 each Standing jobe's flexion 3x10  1# Standing shoulder flexion in prayer position 2 x 10  Standing shoulder flexion in prayer position  wall slides 2 x 10  Standing bicep curl GTB 2 x 10 Standing shoulder press 1# 3 x 10 Standing row BTB 2 x 10 Standing shoulder extension GTB     PATIENT EDUCATION: Education details: PT answered pt's questions concerning HEP.  PT educated pt in appropriate frequency of home exercises including performing strengthening ex's no > than 3x/wk.  Dx, relevant anatomy, POC, HEP, and exercise form. Person educated: Patient Education method: Explanation, Demonstration, Tactile cues, Verbal cues Education comprehension: verbalized understanding, returned demonstration, verbal cues required, tactile cues required, and needs further education   HOME EXERCISE PROGRAM: Access Code: [email or web address] URL: [email or web address] Date: 02/12/2023 Prepared by: Greig Castilla Zaunegger  Updated HEP: - Standing Shoulder Flexion with Dumbbells  - 3 x weekly - 2-3 sets - 10 reps - Scaption with Dumbbells  - 3 x weekly - 2-3 sets - 10 reps - Standing Wall [employer] with Mini Swiss Ball  - 5 x weekly - 2 sets - 10 reps   ASSESSMENT:   CLINICAL IMPRESSION: Though pt does still have shoulder compensatory hike with further ranges in elevation, she has made progress.  As pt reaches higher, she continues to have compensation.  Pt reports having difficulty with pulling plates out of the overhead cabinet and also putting them in.  Pt reports improved performance of reaching behind her back.  PT worked on improving shoulder strength to further improve UE elevation.  Pt demonstrates improved form and control with exercises.  She fatigues with shelf reach.  PT updated HEP and gave her a handout.  PT answered pt's questions of HEP including appropriate frequency of exercises.  She demonstrates good understanding.  Pt  tolerated treatment well and had no pain after Rx.  Pt should benefit from cont skilled PT to address impairments and ongoing goals and to improve function.       OBJECTIVE IMPAIRMENTS: decreased activity tolerance, decreased endurance, decreased ROM, decreased strength, hypomobility, impaired flexibility, impaired UE functional use, and pain.    ACTIVITY LIMITATIONS: carrying, lifting, bathing, dressing, reach over head, and hygiene/grooming   PARTICIPATION LIMITATIONS: meal prep, cleaning, laundry, driving, shopping, and community activity   PERSONAL FACTORS: 3+ comorbidities: cervical spondylosis, osteoporosis, R shoulder pain  are also affecting patient's functional outcome.    REHAB POTENTIAL: Good   CLINICAL DECISION MAKING: Stable/uncomplicated   EVALUATION COMPLEXITY: Low     GOALS:   SHORT TERM GOALS:    Pt will be independent and compliant with HEP for improved pain, ROM, strength, and function  Baseline: Goal status: GOAL MET  Target date:  01/24/2023     2.  Pt will demo improved PROM to 110 deg in flexion and 30 deg in ER for improved shoulder mobility and tightness Baseline:  Goal status: GOAL MET  11/19 Target date:  01/17/2023       3.  Pt will demo L shoulder AAROM to 130 deg in flexion (supine) and 30 deg in ER for improved stiffness and mobility Baseline:  Goal status: GOAL MET  11/19 Target date:  01/31/2023     4.  Pt will be able to actively perform R shoulder flexion AROM to at least 90 deg in supine.  Baseline:  Goal status: GOAL MET  11/19 Target date:  01/24/2023     5.  Pt  will be able to actively elevate R shoulder > 90 deg without significant shoulder hike.  Baseline:  Goal status:  goal met  2/10 Target date:  02/28/2023     6.  Pt will be able to perform her self care activities with no > than minimal difficulty.   Baseline:  Goal status:  GOAL MET  1/4 Target date:  03/21/2023     LONG TERM GOALS: Target date:   06/20/2023      Pt will be able to perform her ADLs and IADLs without significant difficulty and pain.  Baseline:  Goal status:  GOAL MET  2/10   2.   Pt will demo L shoulder AROM to be [facility] t/o for performance of ADLs and IADLs  Baseline:  Goal status: 95% MET 2/10   3.  Pt will be able to perform her normal reaching and functional overhead activities without significant limitation and pain. Baseline:  Goal status:  PROGRESSING  2/10   4.  Pt will be able to reach into an overhead cabinet without significant difficulty.  Baseline:  Goal status:  GOAL MET 1/4 Target date:  04/04/2023     5.  Pt will progress with strengthening exercises per protocol without adverse effects in order to perform her normal functional lifting/carrying activities and household chores.  Baseline:  Goal status: PROGRESSING   2/10       PLAN:   PT FREQUENCY: 1x/week   PT DURATION: 4-6 weeks   PLANNED INTERVENTIONS: Therapeutic exercises, Therapeutic activity, Neuromuscular re-education, Patient/Family education, Self Care, Aquatic Therapy, Dry Needling, Electrical stimulation, Cryotherapy, Moist heat, Taping, Manual therapy, and Re-evaluation   PLAN FOR NEXT SESSION: Cont per Dr. Suzan Nailer RSA protocol.    Audie Clear III PT, DPT 06/10/23 6:16 PM

## 2023-06-10 NOTE — Patient Instructions (Addendum)
 Heidi Shah , Thank you for taking time to come for your Medicare Wellness Visit. I appreciate your ongoing commitment to your health goals. Please review the following plan we discussed and let me know if I can assist you in the future.   Referrals/Orders/Follow-Ups/Clinician Recommendations:   This is a list of the screening recommended for you and due dates:  Health Maintenance  Topic Date Due   COVID-19 Vaccine (4 - 2024-25 season) 11/28/2022   Flu Shot  06/27/2023*   Hepatitis C Screening  02/12/2026*   Pneumonia Vaccine (3 of 3 - PPSV23 or PCV20) 03/10/2039*   Medicare Annual Wellness Visit  06/09/2024   Colon Cancer Screening  06/16/2025   DTaP/Tdap/Td vaccine (3 - Td or Tdap) 02/25/2026   DEXA scan (bone density measurement)  Completed   Zoster (Shingles) Vaccine  Completed   HPV Vaccine  Aged Out  *Topic was postponed. The date shown is not the original due date.    Advanced directives: (Copy Requested) Please bring a copy of your health care power of attorney and living will to the office to be added to your chart at your convenience. You can mail to [facility] [address] [place], [place] [address] or email to [email or web address]  Next Medicare Annual Wellness Visit scheduled for next year: Yes

## 2023-06-10 NOTE — Progress Notes (Signed)
 Subjective:   Heidi Shah is a 78 y.o. who presents for a Medicare Wellness preventive visit.  Visit Complete: In person  VideoDeclined- This patient declined Interactive audio and Acupuncturist. Therefore the visit was completed with audio only.  Persons Participating in Visit: Patient.  AWV Questionnaire: Yes: Patient Medicare AWV questionnaire was completed by the patient on 06/06/23; I have confirmed that all information answered by patient is correct and no changes since this date.  Cardiac Risk Factors include: advanced age (>38men, >39 women);hypertension     Objective:    Today's Vitals   06/10/23 1034  BP: 120/62  Pulse: 89  Temp: 98 F (36.7 C)  TempSrc: Oral  SpO2: 99%  Weight: 116 lb 14.4 oz (53 kg)  Height: 4' 5.5" (1.359 m)   Body mass index is 28.72 kg/m.     06/10/2023   10:49 AM 12/27/2022    3:19 PM 12/16/2022   10:23 AM 12/13/2022    2:57 PM 09/19/2022    7:42 PM 06/14/2022    5:11 PM 05/28/2022    2:23 PM  Advanced Directives  Does Patient Have a Medical Advance Directive? Yes Yes No No No Yes Yes  Type of Estate agent of [place];Living will Healthcare Power of [employer] of [place];Living will Healthcare Power of [place];Living will  Copy of Healthcare Power of Attorney in Chart? No - copy requested     No - copy requested No - copy requested  Would patient like information on creating a medical advance directive?   No - Patient declined  No - Patient declined      Current Medications (verified) Outpatient Encounter Medications as of 06/10/2023  Medication Sig   alendronate (FOSAMAX) 70 MG tablet Take 1 tablet (70 mg total) by mouth every 7 (seven) days. Take with a full glass of water on an empty stomach.   amLODipine (NORVASC) 2.5 MG tablet TAKE ONE TABLET BY MOUTH ONE TIME DAILY   Biotin w/ Vitamins C & E (HAIR SKIN & NAILS GUMMIES PO) Take 2 each by mouth 4 (four) times a week.    Calcium Carb-Cholecalciferol (CALCIUM 600+D3 PO) Take 1 tablet by mouth daily.   cetirizine (ZYRTEC) 10 MG chewable tablet Chew 10 mg by mouth every morning.   cyanocobalamin (VITAMIN B12) 1000 MCG tablet Take 1,000 mcg by mouth daily.   Ginkgo Biloba 60 MG CAPS Take 60 mg by mouth daily.   lisinopril-hydrochlorothiazide (ZESTORETIC) 20-12.5 MG tablet TAKE ONE TABLET BY MOUTH ONE TIME DAILY   Multiple Vitamin (MULTI-VITAMIN) tablet Take 1 tablet by mouth 4 (four) times a week.   omeprazole (PRILOSEC) 40 MG capsule TAKE ONE CAPSULE BY MOUTH ONE TIME DAILY   vitamin C (ASCORBIC ACID) 500 MG tablet Take 500 mg by mouth 4 (four) times a week.   VYZULTA 0.024 % SOLN Place 1 drop into both eyes at bedtime.   No facility-administered encounter medications on file as of 06/10/2023.    Allergies (verified) Nickel sulfate [nickel], Beeswax, Cetrimonium chloride [cetrimide], Lactose intolerance (gi), Methylisothiazolinone, Neomycin sulfate [neomycin], Penicillins, and Propolis   History: Past Medical History:  Diagnosis Date   Allergy    Anxiety    Arthritis    Borderline glaucoma    Cataract    surgery - Bilateral cataracts removed   Cervical spondylolysis    Chicken pox    Colon polyps    Depression    Diverticulitis    Diverticulosis  Esophageal spasm    Esophageal stricture    Family history of adverse reaction to anesthesia    difficult time waking mother up   GERD (gastroesophageal reflux disease)    Heart murmur    Hypertension    Osteoporosis    Traumatic complete tear of left rotator cuff    Past Surgical History:  Procedure Laterality Date   cataract surgery Bilateral 1996   COLONOSCOPY  2022   EYE SURGERY  2000   detached L retina   HERNIA REPAIR Bilateral 1990   inguinal   NECK SURGERY     age 56-trimmed a muscle due to not being able to hold her head up   POLYPECTOMY  2022   REVERSE SHOULDER ARTHROPLASTY Left 12/16/2022   Procedure: LEFT REVERSE SHOULDER  ARTHROPLASTY;  Surgeon: Huel Cote, MD;  Location: [facility] ORS;  Service: Orthopedics;  Laterality: Left;   SHOULDER ARTHROSCOPY WITH ROTATOR CUFF REPAIR AND OPEN BICEPS TENODESIS Left 12/10/2021   Procedure: LEFT SHOULDER ARTHROSCOPY WITH ROTATOR CUFF REPAIR AND OPEN BICEPS TENODESIS;  Surgeon: Huel Cote, MD;  Location: [facility] OR;  Service: Orthopedics;  Laterality: Left;   UPPER GASTROINTESTINAL ENDOSCOPY  2022   Family History  Problem Relation Age of Onset   Heart disease Mother 73   COPD Mother    Crohn's disease Mother    Heart disease Father 12   Cancer Father        kidney   Colon cancer Neg Hx    Esophageal cancer Neg Hx    Stomach cancer Neg Hx    Rectal cancer Neg Hx    Social History   Socioeconomic History   Marital status: Divorced    Spouse name: Not on file   Number of children: 2   Years of education: master's degree   Highest education level: Master's degree (e.g., MA, MS, MEng, MEd, MSW, MBA)  Occupational History   Occupation: retired Runner, broadcasting/film/video  Tobacco Use   Smoking status: Never   Smokeless tobacco: Never  Vaping Use   Vaping status: Never Used  Substance and Sexual Activity   Alcohol use: Yes    Alcohol/week: 1.0 standard drink of alcohol    Types: 1 Glasses of wine per week    Comment: one or two glasses of wine a couple times a week   Drug use: No   Sexual activity: Not on file  Other Topics Concern   Not on file  Social History Narrative      Lives alone in ranch home.    2 children   Son lives in [place]   Continues to teach virtually part time   Social Drivers of Health   Financial Resource Strain: Low Risk  (06/10/2023)   Overall Financial Resource Strain (CARDIA)    Difficulty of Paying Living Expenses: Not hard at all  Food Insecurity: No Food Insecurity (06/10/2023)   Hunger Vital Sign    Worried About Running Out of Food in the Last Year: Never true    Ran Out of Food in the Last Year: Never true  Transportation Needs: No  Transportation Needs (06/10/2023)   PRAPARE - Administrator, Civil Service (Medical): No    Lack of Transportation (Non-Medical): No  Physical Activity: Sufficiently Active (06/10/2023)   Exercise Vital Sign    Days of Exercise per Week: 3 days    Minutes of Exercise per Session: 60 min  Stress: No Stress Concern Present (06/10/2023)   [employer] of Occupational Health -  Occupational Stress Questionnaire    Feeling of Stress : Not at all  Social Connections: Moderately Integrated (06/10/2023)   Social Connection and Isolation Panel [NHANES]    Frequency of Communication with Friends and Family: More than three times a week    Frequency of Social Gatherings with Friends and Family: More than three times a week    Attends Religious Services: More than 4 times per year    Active Member of [employer] or Organizations: Yes    Attends Engineer, structural: More than 4 times per year    Marital Status: Divorced    Tobacco Counseling Counseling given: Not Answered    Clinical Intake:  Pre-visit preparation completed: Yes  Pain : No/denies pain     BMI - recorded: 28.72 Nutritional Status: BMI 25 -29 Overweight Nutritional Risks: None Diabetes: No  How often do you need to have someone help you when you read instructions, pamphlets, or other written materials from your doctor or pharmacy?: 1 - Never  Interpreter Needed?: No  Information entered by :: Theresa Mulligan LPN   Activities of Daily Living     06/10/2023   10:47 AM 06/06/2023   10:05 AM  In your present state of health, do you have any difficulty performing the following activities:  Hearing? 0 0  Vision? 0 0  Difficulty concentrating or making decisions? 0 0  Walking or climbing stairs? 0 0  Dressing or bathing? 0 0  Doing errands, shopping? 0 0  Preparing Food and eating ? N N  Using the Toilet? N N  In the past six months, have you accidently leaked urine? Malvin Johns  Comment Wears Pads.  Followed by Urologist   Do you have problems with loss of bowel control? N N  Managing your Medications? N N  Managing your Finances? N N  Housekeeping or managing your Housekeeping? N N    Patient Care Team: Kristian Covey, MD as PCP - General (Family Medicine) Kevan Ny, MD as Referring Physician (Dermatology) [place], Heather, OD (Optometry) Hilarie Fredrickson, MD as Consulting Physician (Gastroenterology)  Indicate any recent Medical Services you may have received from other than [facility] providers in the past year (date may be approximate).     Assessment:   This is a routine wellness examination for Ariely.  Hearing/Vision screen Hearing Screening - Comments:: Denies hearing difficulties   Vision Screening - Comments:: Wears rx glasses - up to date with routine eye exams with  Dr Heather [place]   Goals Addressed               This Visit's Progress     Increase physical activity (pt-stated)         Depression Screen     06/10/2023   10:46 AM 04/04/2023   11:11 AM 03/15/2023    8:07 AM 06/07/2022   11:54 AM 05/28/2022    2:20 PM 01/06/2022    3:51 PM 11/23/2021   11:04 AM  PHQ 2/9 Scores  PHQ - 2 Score 0 0 0 0 0 1 0  PHQ- 9 Score  0 0   6 1    Fall Risk     06/10/2023   10:48 AM 06/06/2023   10:05 AM 03/15/2023    8:07 AM 12/03/2022   11:06 AM 06/07/2022   11:54 AM  Fall Risk   Falls in the past year? 0 1 0 0 0  Number falls in past yr: 0 0 0  0  Injury with Fall? 0 0 0  0  Risk for fall due to : No Fall Risks    No Fall Risks  Follow up Falls prevention discussed;Falls evaluation completed  Falls evaluation completed  Falls evaluation completed    MEDICARE RISK AT HOME:  Medicare Risk at Home Any stairs in or around the home?: Yes If so, are there any without handrails?: Yes Home free of loose throw rugs in walkways, pet beds, electrical cords, etc?: Yes Adequate lighting in your home to reduce risk of falls?: Yes Life alert?: No Use of a cane, walker  or w/c?: No Grab bars in the bathroom?: No Shower chair or bench in shower?: Yes Elevated toilet seat or a handicapped toilet?: No  TIMED UP AND GO:  Was the test performed?  Yes  Length of time to ambulate 10 feet: 10 sec Gait steady and fast without use of assistive device  Cognitive Function: 6CIT completed    03/04/2017    8:58 AM  MMSE - Mini Mental State Exam  Not completed: --        06/10/2023   10:49 AM 05/28/2022    2:23 PM 05/07/2021    9:13 AM 04/24/2019   11:46 AM  6CIT Screen  What Year? 0 points 0 points 0 points 0 points  What month? 0 points 0 points 0 points 0 points  What time? 0 points 0 points 0 points 0 points  Count back from 20 0 points 0 points 0 points 0 points  Months in reverse 0 points 0 points 0 points 0 points  Repeat phrase 0 points 0 points 0 points 0 points  Total Score 0 points 0 points 0 points 0 points    Immunizations Immunization History  Administered Date(s) Administered   Fluad Quad(high Dose 65+) 01/05/2019, 02/25/2020, 03/01/2022   Influenza, High Dose Seasonal PF 02/03/2015, 02/13/2016, 01/09/2017, 01/02/2018   Influenza,inj,Quad PF,6+ Mos 12/27/2012, 12/26/2013, 02/27/2021   Influenza-Unspecified 01/09/2017, 01/05/2019   PFIZER(Purple Top)SARS-COV-2 Vaccination 05/19/2019, 06/19/2019, 03/25/2020   Pneumococcal Conjugate-13 01/01/2014   Pneumococcal Polysaccharide-23 12/27/2012   Td 08/27/2005   Tdap 02/26/2016   Zoster Recombinant(Shingrix) 02/20/2019, 10/01/2019    Screening Tests Health Maintenance  Topic Date Due   COVID-19 Vaccine (4 - 2024-25 season) 11/28/2022   INFLUENZA VACCINE  06/27/2023 (Originally 10/28/2022)   Hepatitis C Screening  02/12/2026 (Originally 02/06/1964)   Pneumonia Vaccine 56+ Years old (3 of 3 - PPSV23 or PCV20) 03/10/2039 (Originally 12/27/2017)   Medicare Annual Wellness (AWV)  06/09/2024   Colonoscopy  06/16/2025   DTaP/Tdap/Td (3 - Td or Tdap) 02/25/2026   DEXA SCAN  Completed   Zoster  Vaccines- Shingrix  Completed   HPV VACCINES  Aged Out    Health Maintenance  Health Maintenance Due  Topic Date Due   COVID-19 Vaccine (4 - 2024-25 season) 11/28/2022   Health Maintenance Items Addressed:    Additional Screening:  Vision Screening: Recommended annual ophthalmology exams for early detection of glaucoma and other disorders of the eye.  Dental Screening: Recommended annual dental exams for proper oral hygiene  Community Resource Referral / Chronic Care Management:  CRR required this visit?  No   CCM required this visit?  No     Plan:     I have personally reviewed and noted the following in the patient's chart:   Medical and social history Use of alcohol, tobacco or illicit drugs  Current medications and supplements including opioid prescriptions. Patient is not currently taking opioid prescriptions.  Functional ability and status Nutritional status Physical activity Advanced directives List of other physicians Hospitalizations, surgeries, and ER visits in previous 12 months Vitals Screenings to include cognitive, depression, and falls Referrals and appointments  In addition, I have reviewed and discussed with patient certain preventive protocols, quality metrics, and best practice recommendations. A written personalized care plan for preventive services as well as general preventive health recommendations were provided to patient.     Tillie Rung, LPN   0/[id number]/1191   After Visit Summary: (In Person-Declined) Patient declined AVS at this time.  Notes: Nothing significant to report at this time.

## 2023-06-14 ENCOUNTER — Ambulatory Visit (HOSPITAL_BASED_OUTPATIENT_CLINIC_OR_DEPARTMENT_OTHER): Payer: Medicare HMO | Admitting: Physical Therapy

## 2023-06-14 ENCOUNTER — Encounter (HOSPITAL_BASED_OUTPATIENT_CLINIC_OR_DEPARTMENT_OTHER): Payer: Self-pay | Admitting: Physical Therapy

## 2023-06-14 DIAGNOSIS — M25512 Pain in left shoulder: Secondary | ICD-10-CM | POA: Diagnosis not present

## 2023-06-14 DIAGNOSIS — M6281 Muscle weakness (generalized): Secondary | ICD-10-CM | POA: Diagnosis not present

## 2023-06-14 DIAGNOSIS — M25612 Stiffness of left shoulder, not elsewhere classified: Secondary | ICD-10-CM

## 2023-06-14 NOTE — Therapy (Signed)
 OUTPATIENT PHYSICAL THERAPY TREATMENT    Patient Name: Heidi Shah MRN: [id number] DOB:1945-04-29, 78 y.o., female Today's Date: 06/15/2023  END OF SESSION:  PT End of Session - 06/14/23 0857     Visit Number 33    Number of Visits 35    Date for PT Re-Evaluation 06/20/23    Authorization Type humana MCR    PT Start Time 5643    PT Stop Time 0934    PT Time Calculation (min) 42 min    Activity Tolerance Patient tolerated treatment well    Behavior During Therapy WFL for tasks assessed/performed                      Past Medical History:  Diagnosis Date   Allergy    Anxiety    Arthritis    Borderline glaucoma    Cataract    surgery - Bilateral cataracts removed   Cervical spondylolysis    Chicken pox    Colon polyps    Depression    Diverticulitis    Diverticulosis    Esophageal spasm    Esophageal stricture    Family history of adverse reaction to anesthesia    difficult time waking mother up   GERD (gastroesophageal reflux disease)    Heart murmur    Hypertension    Osteoporosis    Traumatic complete tear of left rotator cuff    Past Surgical History:  Procedure Laterality Date   cataract surgery Bilateral 1996   COLONOSCOPY  2022   EYE SURGERY  2000   detached L retina   HERNIA REPAIR Bilateral 1990   inguinal   NECK SURGERY     age 41-trimmed a muscle due to not being able to hold her head up   POLYPECTOMY  2022   REVERSE SHOULDER ARTHROPLASTY Left 12/16/2022   Procedure: LEFT REVERSE SHOULDER ARTHROPLASTY;  Surgeon: Huel Cote, MD;  Location: [facility] ORS;  Service: Orthopedics;  Laterality: Left;   SHOULDER ARTHROSCOPY WITH ROTATOR CUFF REPAIR AND OPEN BICEPS TENODESIS Left 12/10/2021   Procedure: LEFT SHOULDER ARTHROSCOPY WITH ROTATOR CUFF REPAIR AND OPEN BICEPS TENODESIS;  Surgeon: Huel Cote, MD;  Location: [facility] OR;  Service: Orthopedics;  Laterality: Left;   UPPER GASTROINTESTINAL ENDOSCOPY  2022   Patient Active Problem  List   Diagnosis Date Noted   Traumatic complete tear of left rotator cuff    Cervical spondylosis with myelopathy and radiculopathy 03/14/2020   Neck pain 03/14/2020   Aortic heart murmur 02/25/2020   Age-related osteoporosis without current pathological fracture 04/04/2019   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   Constipation 03/05/2013   GERD (gastroesophageal reflux disease) 01/19/2013   Esophageal spasm 01/19/2013   Dysphagia 01/19/2013   Diverticulitis of colon (without mention of hemorrhage)(562.11) 11/23/2012   History of depression 11/23/2012   Essential hypertension, benign 11/23/2012     THERAPY DIAG:  Left shoulder pain, unspecified chronicity  Muscle weakness (generalized)  Stiffness of left shoulder, not elsewhere classified  REFERRING PROVIDER: Huel Cote, MD   REFERRING DIAG: S46.012A (ICD-10-CM) - Traumatic complete tear of left rotator cuff, initial encounter    S/p L RSA   THERAPY DIAG:  Left shoulder pain, unspecified chronicity   Stiffness of left shoulder, not elsewhere classified   Muscle weakness (generalized)   Rationale for Evaluation and Treatment: Rehabilitation   ONSET DATE: DOS 12/16/2022   SUBJECTIVE:  SUBJECTIVE STATEMENT: Pt is nearly 6 months post op.  She denies any adverse effects after prior Rx.  She denies pain currently.  Pt saw MD after last Rx and he was pleased with progress. She had x rays which looked good.  Pt states he released her from his care.  Pt reports the massages have also helped.     Hand dominance: Right   PERTINENT HISTORY: -L shoulder reverse shoulder arthroplasty (RSA) and biceps tenodesis on 12/16/2022.  -L shoulder RCR and biceps tenodesis in 11/2021. -Arthritis, cervical spondylosis, osteoporosis, HTN -Pt states she does have 2  small tears in her R shoulder and does some pain in R shoulder.     PAIN:  NPRS:  0/10 current, 3/10 worst, 0/10 best Pt denies soreness.    PRECAUTIONS: Other: Tears and pain in R shoulder, osteoporosis       WEIGHT BEARING RESTRICTIONS: Yes L UE   FALLS:  Has patient fallen in last 6 months? No   LIVING ENVIRONMENT: Lives with: lives alone Lives in: 1 story home Stairs: has steps to enter home and bilat rails on one entrance     OCCUPATION: Pt is retired.   PLOF: Independent   PATIENT GOALS:  to regain 100% movement in arm and use it as normal     OBJECTIVE:    DIAGNOSTIC FINDINGS:  Pt is post op.      TODAY'S TREATMENT:                                                                                                                                         06/14/23 Therapeutic Exercise: Pulleys in flexion, scaption, and abd x 20 each Standing jobe's flexion x10 1#, 2# 2x10 reps Standing scaption x10  1#, 2# x 8, 10 reps Standing ER with arm at side x10 with RTB 4D ball rolls on wall 2x10    Pt received L shoulder PROM in flexion, abduction, ER, and IR in supine.   Therapeutic Activities: Shelf reach 3x10  1# Waist to chest reach x10, 8 reps  4#  Standing functional IR AROM x10   06/10/23 Therapeutic Exercise: Pulleys in flexion, scaption, and abd x 20 each Standing jobe's flexion 2x10 1#, 2# x 5 reps Standing scaption 3x10  1# 4D ball rolls on wall 2x10    Shoulder AROM:  R/L:  168/144 deg Pt still has shoulder hike with increased UE elevation though has improved.  Pt received L shoulder PROM in flexion, abduction, ER, and IR in supine.  Updated HEP and gave pt a HEP handout.  PT educated pt in correct form and appropriate frequency.     Therapeutic Activities: Shelf reach 3x10  1# Waist to chest reach x10  3#, 2x10  4#  Standing functional IR AROM 2x10   05/31/2023 Therapeutic Exercise: Pulleys in flexion, scaption, and abd x 20 each Standing  jobe's flexion 3x10  1# Standing scaption 3x10  1# S/L ER with 1# x  8,10 reps PT reviewed HEP and answered pt's questions.     Neuro Re-ed Activities: 4D ball rolls on wall 2x10 ABC's with ball on wall x 1 rep Standing D2 flexion 2x10  Therapeutic Activities: Shelf reach 2x10  1# Waist to chest reach 3x10  3#  Standing functional IR AROM 2x10    PATIENT EDUCATION: Education details: PT answered pt's questions concerning HEP.  PT educated pt in appropriate frequency of home exercises including performing strengthening ex's no > than 3x/wk.  Dx, relevant anatomy, POC, HEP, and exercise form. Person educated: Patient Education method: Explanation, Demonstration, Tactile cues, Verbal cues Education comprehension: verbalized understanding, returned demonstration, verbal cues required, tactile cues required, and needs further education   HOME EXERCISE PROGRAM: Access Code: [email or web address] URL: [email or web address] Date: 02/12/2023 Prepared by: Greig Castilla Zaunegger  Updated HEP: - Standing Shoulder Flexion with Dumbbells  - 3 x weekly - 2-3 sets - 10 reps - Scaption with Dumbbells  - 3 x weekly - 2-3 sets - 10 reps - Standing Wall [employer] with Mini Swiss Ball  - 5 x weekly - 2 sets - 10 reps   ASSESSMENT:   CLINICAL IMPRESSION: Pt is improving with shoulder strength as evidenced by performance of exercises.  Pt able to increase reps with 2# with standing flexion and scaption.  Pt does get fatigued with exercises though is improving.  PT instructed pt to stop the set when having compensatory shoulder with flexion, scaption, and shelf reaching.  Pt had some pain with waist to chest reach and stopped exercise.  PT answered pt's questions concerning HEP and pt verbalizes understanding.  She tolerated exercises and PROM well.  She reports being a little sore but not bad and having no increased pain after Rx.  Pt should be ready to discharge in 1-2 visits.     OBJECTIVE  IMPAIRMENTS: decreased activity tolerance, decreased endurance, decreased ROM, decreased strength, hypomobility, impaired flexibility, impaired UE functional use, and pain.    ACTIVITY LIMITATIONS: carrying, lifting, bathing, dressing, reach over head, and hygiene/grooming   PARTICIPATION LIMITATIONS: meal prep, cleaning, laundry, driving, shopping, and community activity   PERSONAL FACTORS: 3+ comorbidities: cervical spondylosis, osteoporosis, R shoulder pain  are also affecting patient's functional outcome.    REHAB POTENTIAL: Good   CLINICAL DECISION MAKING: Stable/uncomplicated   EVALUATION COMPLEXITY: Low     GOALS:   SHORT TERM GOALS:    Pt will be independent and compliant with HEP for improved pain, ROM, strength, and function  Baseline: Goal status: GOAL MET  Target date:  01/24/2023     2.  Pt will demo improved PROM to 110 deg in flexion and 30 deg in ER for improved shoulder mobility and tightness Baseline:  Goal status: GOAL MET  11/19 Target date:  01/17/2023       3.  Pt will demo L shoulder AAROM to 130 deg in flexion (supine) and 30 deg in ER for improved stiffness and mobility Baseline:  Goal status: GOAL MET  11/19 Target date:  01/31/2023     4.  Pt will be able to actively perform R shoulder flexion AROM to at least 90 deg in supine.  Baseline:  Goal status: GOAL MET  11/19 Target date:  01/24/2023     5.  Pt will be able to actively elevate R shoulder > 90 deg without significant shoulder hike.  Baseline:  Goal status:  goal met  2/10  Target date:  02/28/2023     6.  Pt will be able to perform her self care activities with no > than minimal difficulty.   Baseline:  Goal status:  GOAL MET  1/4 Target date:  03/21/2023     LONG TERM GOALS: Target date:   06/20/2023     Pt will be able to perform her ADLs and IADLs without significant difficulty and pain.  Baseline:  Goal status:  GOAL MET  2/10   2.   Pt will demo L shoulder AROM to be  [facility] t/o for performance of ADLs and IADLs  Baseline:  Goal status: 95% MET 2/10   3.  Pt will be able to perform her normal reaching and functional overhead activities without significant limitation and pain. Baseline:  Goal status:  PROGRESSING  2/10   4.  Pt will be able to reach into an overhead cabinet without significant difficulty.  Baseline:  Goal status:  GOAL MET 1/4 Target date:  04/04/2023     5.  Pt will progress with strengthening exercises per protocol without adverse effects in order to perform her normal functional lifting/carrying activities and household chores.  Baseline:  Goal status: PROGRESSING   2/10       PLAN:   PT FREQUENCY: 1x/week   PT DURATION: 4-6 weeks   PLANNED INTERVENTIONS: Therapeutic exercises, Therapeutic activity, Neuromuscular re-education, Patient/Family education, Self Care, Aquatic Therapy, Dry Needling, Electrical stimulation, Cryotherapy, Moist heat, Taping, Manual therapy, and Re-evaluation   PLAN FOR NEXT SESSION: Cont per Dr. Suzan Nailer RSA protocol.  Discharge pt in 1-2 visits.     Audie Clear III PT, DPT 06/15/23 10:45 AM

## 2023-06-21 ENCOUNTER — Ambulatory Visit (HOSPITAL_BASED_OUTPATIENT_CLINIC_OR_DEPARTMENT_OTHER): Payer: Medicare HMO | Admitting: Physical Therapy

## 2023-06-21 ENCOUNTER — Encounter (HOSPITAL_BASED_OUTPATIENT_CLINIC_OR_DEPARTMENT_OTHER): Payer: Self-pay | Admitting: Physical Therapy

## 2023-06-21 DIAGNOSIS — M25512 Pain in left shoulder: Secondary | ICD-10-CM | POA: Diagnosis not present

## 2023-06-21 DIAGNOSIS — M25612 Stiffness of left shoulder, not elsewhere classified: Secondary | ICD-10-CM | POA: Diagnosis not present

## 2023-06-21 DIAGNOSIS — M6281 Muscle weakness (generalized): Secondary | ICD-10-CM

## 2023-06-21 NOTE — Therapy (Signed)
 OUTPATIENT PHYSICAL THERAPY TREATMENT / DISCHARGE   Patient Name: Heidi Shah MRN: [id number] DOB:[birth year], 78 y.o., female Today's Date: 06/22/2023  END OF SESSION:  PT End of Session - 06/21/23 0859     Visit Number 34    Number of Visits 34    Authorization Type humana MCR    PT Start Time 0854    PT Stop Time 0945    PT Time Calculation (min) 51 min    Activity Tolerance Patient tolerated treatment well    Behavior During Therapy WFL for tasks assessed/performed                      Past Medical History:  Diagnosis Date   Allergy    Anxiety    Arthritis    Borderline glaucoma    Cataract    surgery - Bilateral cataracts removed   Cervical spondylolysis    Chicken pox    Colon polyps    Depression    Diverticulitis    Diverticulosis    Esophageal spasm    Esophageal stricture    Family history of adverse reaction to anesthesia    difficult time waking mother up   GERD (gastroesophageal reflux disease)    Heart murmur    Hypertension    Osteoporosis    Traumatic complete tear of left rotator cuff    Past Surgical History:  Procedure Laterality Date   cataract surgery Bilateral 1996   COLONOSCOPY  2022   EYE SURGERY  2000   detached L retina   HERNIA REPAIR Bilateral 1990   inguinal   NECK SURGERY     age 24-trimmed a muscle due to not being able to hold her head up   POLYPECTOMY  2022   REVERSE SHOULDER ARTHROPLASTY Left 12/16/2022   Procedure: LEFT REVERSE SHOULDER ARTHROPLASTY;  Surgeon: Huel Cote, MD;  Location: [facility] ORS;  Service: Orthopedics;  Laterality: Left;   SHOULDER ARTHROSCOPY WITH ROTATOR CUFF REPAIR AND OPEN BICEPS TENODESIS Left 12/10/2021   Procedure: LEFT SHOULDER ARTHROSCOPY WITH ROTATOR CUFF REPAIR AND OPEN BICEPS TENODESIS;  Surgeon: Huel Cote, MD;  Location: [facility];  Service: Orthopedics;  Laterality: Left;   UPPER GASTROINTESTINAL ENDOSCOPY  2022   Patient Active Problem List   Diagnosis Date Noted    Traumatic complete tear of left rotator cuff    Cervical spondylosis with myelopathy and radiculopathy 03/14/2020   Neck pain 03/14/2020   Aortic heart murmur 02/25/2020   Age-related osteoporosis without current pathological fracture 04/04/2019   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   Constipation 03/05/2013   GERD (gastroesophageal reflux disease) 01/19/2013   Esophageal spasm 01/19/2013   Dysphagia 01/19/2013   Diverticulitis of colon (without mention of hemorrhage)(562.11) 11/23/2012   History of depression 11/23/2012   Essential hypertension, benign 11/23/2012     THERAPY DIAG:  Left shoulder pain, unspecified chronicity  Muscle weakness (generalized)  Stiffness of left shoulder, not elsewhere classified  REFERRING PROVIDER: Huel Cote, MD   REFERRING DIAG: S46.012A (ICD-10-CM) - Traumatic complete tear of left rotator cuff, initial encounter    S/p L RSA   THERAPY DIAG:  Left shoulder pain, unspecified chronicity   Stiffness of left shoulder, not elsewhere classified   Muscle weakness (generalized)   Rationale for Evaluation and Treatment: Rehabilitation   ONSET DATE: DOS 12/16/2022   SUBJECTIVE:  SUBJECTIVE STATEMENT: Pt is 6 months post op. Pt denies any adverse effects after prior Rx.  Pt states she had some pain in shoulder on Friday and bothered her on Saturday.  Pt reports she was having pain with moving arm back.  Pt held her 2 yr old grandchild on Saturday and tried to pick up her 58 yr old grandchild.  She doesn't think it made her hurt worse, but she still hurt t/o the day Saturday.  Pt states she is able to reach into her cabinet and take Pt will be able to perform her normal reaching and functional overhead activities without significant limitation and pain. objects out of  cabinet better now.  Pt states she is ready for discharge.       Hand dominance: Right   PERTINENT HISTORY: -L shoulder reverse shoulder arthroplasty (RSA) and biceps tenodesis on 12/16/2022.  -L shoulder RCR and biceps tenodesis in 11/2021. -Arthritis, cervical spondylosis, osteoporosis, HTN -Pt states she does have 2 small tears in her R shoulder and does some pain in R shoulder.     PAIN:  NPRS:  0/10 current, 3/10 worst, 0/10 best No pain, but has soreness.    PRECAUTIONS: Other: Tears and pain in R shoulder, osteoporosis       WEIGHT BEARING RESTRICTIONS: Yes L UE   FALLS:  Has patient fallen in last 6 months? No   LIVING ENVIRONMENT: Lives with: lives alone Lives in: 1 story home Stairs: has steps to enter home and bilat rails on one entrance     OCCUPATION: Pt is retired.   PLOF: Independent   PATIENT GOALS:  to regain 100% movement in arm and use it as normal     OBJECTIVE:    DIAGNOSTIC FINDINGS:  Pt is post op.      TODAY'S TREATMENT:                                                                                                                                          Therapeutic Exercise: Pulleys in flexion, scaption, and abd x 20 each Standing jobe's flexion x10 1#, 2# 2x10 reps Standing scaption 2# 3x10 reps Standing ER with arm at side2 x10 with RTB 4D ball rolls on wall 2x10 with 1.1# ball  PT extensively reviewed HEP and answered pt's questions.  PT updated HEP.  Pt requested a handout of her entire HEP.  PT printed off HEP.  Shelf reach 3x10  1#  FOTO:  Prior/Current:  58/62.  goal of 34     PATIENT EDUCATION: Education details: PT answered pt's questions concerning HEP.  PT educated pt in appropriate frequency of home exercises.  Dx, relevant anatomy, POC/discharge planning, HEP, and exercise form. Person educated: Patient Education method: Explanation, Demonstration, Tactile cues, Verbal cues Education comprehension: verbalized  understanding, returned demonstration, verbal cues required, tactile cues required, and needs further education   HOME  EXERCISE PROGRAM: Access Code: [email or web address] URL: [email or web address] Date: 02/12/2023 Prepared by: Greig Castilla Zaunegger     ASSESSMENT:   CLINICAL IMPRESSION: Pt has made great progress in PT.  She is able to perform her ADLs/IADLs and normal/functional reaching activities without significant difficulty and pain.  Pt states she is able to reach into her cabinet and take objects out with improved ease.  Though she has weakness in shoulder, she has progressed well with strengthening exercises demonstrating improved form and improved tolerance with exercises.  Pt has made good progress with ROM t/o L shoulder.  PT spent extensive time going through HEP updating it and educating pt with correct exercises and appropriate frequency.  Pt requested a handout and PT gave pt a handout of final HEP.  Pt verbalizes understanding and is independent with HEP.  Pt demonstrates improved self perceived disability with FOTO score improvement.  She met her FOTO goal.  Pt has met all of her goals except 95% meeting LTG #2.  She is ready for discharge.        OBJECTIVE IMPAIRMENTS: decreased activity tolerance, decreased endurance, decreased ROM, decreased strength, hypomobility, impaired flexibility, impaired UE functional use, and pain.    ACTIVITY LIMITATIONS: carrying, lifting, bathing, dressing, reach over head, and hygiene/grooming   PARTICIPATION LIMITATIONS: meal prep, cleaning, laundry, driving, shopping, and community activity   PERSONAL FACTORS: 3+ comorbidities: cervical spondylosis, osteoporosis, R shoulder pain  are also affecting patient's functional outcome.    REHAB POTENTIAL: Good   CLINICAL DECISION MAKING: Stable/uncomplicated   EVALUATION COMPLEXITY: Low     GOALS:   SHORT TERM GOALS:    Pt will be independent and compliant with HEP for improved pain,  ROM, strength, and function  Baseline: Goal status: GOAL MET  Target date:  01/24/2023     2.  Pt will demo improved PROM to 110 deg in flexion and 30 deg in ER for improved shoulder mobility and tightness Baseline:  Goal status: GOAL MET  11/19 Target date:  01/17/2023       3.  Pt will demo L shoulder AAROM to 130 deg in flexion (supine) and 30 deg in ER for improved stiffness and mobility Baseline:  Goal status: GOAL MET  11/19 Target date:  01/31/2023     4.  Pt will be able to actively perform R shoulder flexion AROM to at least 90 deg in supine.  Baseline:  Goal status: GOAL MET  11/19 Target date:  01/24/2023     5.  Pt will be able to actively elevate R shoulder > 90 deg without significant shoulder hike.  Baseline:  Goal status:  goal met  2/10 Target date:  02/28/2023     6.  Pt will be able to perform her self care activities with no > than minimal difficulty.   Baseline:  Goal status:  GOAL MET  1/4 Target date:  03/21/2023     LONG TERM GOALS: Target date:   06/20/2023     Pt will be able to perform her ADLs and IADLs without significant difficulty and pain.  Baseline:  Goal status:  GOAL MET  2/10   2.   Pt will demo L shoulder AROM to be [facility] t/o for performance of ADLs and IADLs  Baseline:  Goal status: 95% MET 2/10   3.  Pt will be able to perform her normal reaching and functional overhead activities without significant limitation and pain. Baseline:  Goal status:  GOAL MET  3/25   4.  Pt will be able to reach into an overhead cabinet without significant difficulty.  Baseline:  Goal status:  GOAL MET 1/4 Target date:  04/04/2023     5.  Pt will progress with strengthening exercises per protocol without adverse effects in order to perform her normal functional lifting/carrying activities and household chores.  Baseline:  Goal status: GOAL MET   3/25       PLAN:   PT FREQUENCY: 1 visit  PT DURATION:  1 visit   PLANNED INTERVENTIONS:  Therapeutic exercises, Therapeutic activity, Neuromuscular re-education, Patient/Family education, Self Care, Aquatic Therapy, Dry Needling, Electrical stimulation, Cryotherapy, Moist heat, Taping, Manual therapy, and Re-evaluation   PLAN FOR NEXT SESSION: Pt to be discharged from skilled PT due to meeting all goals except 95% meeting AROM goal.  Pt is agreeable with discharge and will cont with HEP.     PHYSICAL THERAPY DISCHARGE SUMMARY  Visits from Start of Care: 34  Current functional level related to goals / functional outcomes: See above   Remaining deficits: See above   Education / Equipment: See above    Audie Clear III PT, DPT 06/22/23 10:04 AM

## 2023-06-28 ENCOUNTER — Encounter (HOSPITAL_BASED_OUTPATIENT_CLINIC_OR_DEPARTMENT_OTHER): Payer: Medicare HMO | Admitting: Physical Therapy

## 2023-07-10 NOTE — Progress Notes (Signed)
 Chief Complaint: Primary GI MD: Dr. Elvin Hammer  HPI:  *** is a  ***  who was referred to me by Marquetta Sit, MD for a complaint of *** .     Discussed the use of AI scribe software for clinical note transcription with the patient, who gave verbal consent to proceed.  History of Present Illness      PREVIOUS GI WORKUP   06/16/20 colonoscopy with diverticulosis in the left colon and otherwise norma, no repeat recommended   06/04/2022 EGD with Londa Rival dilation to [address] [place]. Findings of 1 benign-appearing esophageal stenosis which was dilated.   Past Medical History:  Diagnosis Date   Allergy    Anxiety    Arthritis    Borderline glaucoma    Cataract    surgery - Bilateral cataracts removed   Cervical spondylolysis    Chicken pox    Colon polyps    Depression    Diverticulitis    Diverticulosis    Esophageal spasm    Esophageal stricture    Family history of adverse reaction to anesthesia    difficult time waking mother up   GERD (gastroesophageal reflux disease)    Heart murmur    Hypertension    Osteoporosis    Traumatic complete tear of left rotator cuff     Past Surgical History:  Procedure Laterality Date   cataract surgery Bilateral 1996   COLONOSCOPY  2022   EYE SURGERY  2000   detached L retina   HERNIA REPAIR Bilateral 1990   inguinal   NECK SURGERY     age 78-trimmed a muscle due to not being able to hold her head up   POLYPECTOMY  2022   REVERSE SHOULDER ARTHROPLASTY Left 12/16/2022   Procedure: LEFT REVERSE SHOULDER ARTHROPLASTY;  Surgeon: Wilhelmenia Harada, MD;  Location: [facility] ORS;  Service: Orthopedics;  Laterality: Left;   SHOULDER ARTHROSCOPY WITH ROTATOR CUFF REPAIR AND OPEN BICEPS TENODESIS Left 12/10/2021   Procedure: LEFT SHOULDER ARTHROSCOPY WITH ROTATOR CUFF REPAIR AND OPEN BICEPS TENODESIS;  Surgeon: Wilhelmenia Harada, MD;  Location: [facility];  Service: Orthopedics;  Laterality: Left;   UPPER GASTROINTESTINAL ENDOSCOPY  2022    Current  Outpatient Medications  Medication Sig Dispense Refill   alendronate (FOSAMAX) 70 MG tablet Take 1 tablet (70 mg total) by mouth every 7 (seven) days. Take with a full glass of water on an empty stomach. 4 tablet 11   amLODipine (NORVASC) 2.5 MG tablet TAKE ONE TABLET BY MOUTH ONE TIME DAILY 90 tablet 3   Biotin w/ Vitamins C & E (HAIR SKIN & NAILS GUMMIES PO) Take 2 each by mouth 4 (four) times a week.     Calcium Carb-Cholecalciferol (CALCIUM 600+D3 PO) Take 1 tablet by mouth daily.     cetirizine (ZYRTEC) 10 MG chewable tablet Chew 10 mg by mouth every morning.     cyanocobalamin (VITAMIN B12) 1000 MCG tablet Take 1,000 mcg by mouth daily.     Ginkgo Biloba 60 MG CAPS Take 60 mg by mouth daily.     lisinopril-hydrochlorothiazide (ZESTORETIC) 20-12.5 MG tablet TAKE ONE TABLET BY MOUTH ONE TIME DAILY 90 tablet 3   Multiple Vitamin (MULTI-VITAMIN) tablet Take 1 tablet by mouth 4 (four) times a week.     omeprazole (PRILOSEC) 40 MG capsule TAKE ONE CAPSULE BY MOUTH ONE TIME DAILY 90 capsule 1   vitamin C (ASCORBIC ACID) 500 MG tablet Take 500 mg by mouth 4 (four) times a week.  VYZULTA 0.024 % SOLN Place 1 drop into both eyes at bedtime.     No current facility-administered medications for this visit.    Allergies as of 07/11/2023 - Review Complete 06/21/2023  Allergen Reaction Noted   Nickel sulfate [nickel] Rash 04/17/2018   Beeswax  04/17/2018   Cetrimonium chloride [cetrimide]  04/17/2018   Lactose intolerance (gi) Diarrhea 12/13/2022   Methylisothiazolinone  04/17/2018   Neomycin sulfate [neomycin]  04/17/2018   Penicillins  11/23/2012   Propolis  04/17/2018    Family History  Problem Relation Age of Onset   Heart disease Mother 69   COPD Mother    Crohn's disease Mother    Heart disease Father 6   Cancer Father        kidney   Colon cancer Neg Hx    Esophageal cancer Neg Hx    Stomach cancer Neg Hx    Rectal cancer Neg Hx     Social History   Socioeconomic  History   Marital status: Divorced    Spouse name: Not on file   Number of children: 2   Years of education: master's degree   Highest education level: Master's degree (e.g., MA, MS, MEng, MEd, MSW, MBA)  Occupational History   Occupation: retired Runner, broadcasting/film/video  Tobacco Use   Smoking status: Never   Smokeless tobacco: Never  Vaping Use   Vaping status: Never Used  Substance and Sexual Activity   Alcohol use: Yes    Alcohol/week: 1.0 standard drink of alcohol    Types: 1 Glasses of wine per week    Comment: one or two glasses of wine a couple times a week   Drug use: No   Sexual activity: Not on file  Other Topics Concern   Not on file  Social History Narrative      Lives alone in ranch home.    2 children   Son lives in [place]    Continues to teach virtually part time   Social Drivers of Health   Financial Resource Strain: Low Risk  (06/10/2023)   Overall Financial Resource Strain (CARDIA)    Difficulty of Paying Living Expenses: Not hard at all  Food Insecurity: No Food Insecurity (06/10/2023)   Hunger Vital Sign    Worried About Running Out of Food in the Last Year: Never true    Ran Out of Food in the Last Year: Never true  Transportation Needs: No Transportation Needs (06/10/2023)   PRAPARE - Administrator, Civil Service (Medical): No    Lack of Transportation (Non-Medical): No  Physical Activity: Sufficiently Active (06/10/2023)   Exercise Vital Sign    Days of Exercise per Week: 3 days    Minutes of Exercise per Session: 60 min  Stress: No Stress Concern Present (06/10/2023)   [employer] of Occupational Health - Occupational Stress Questionnaire    Feeling of Stress : Not at all  Social Connections: Moderately Integrated (06/10/2023)   Social Connection and Isolation Panel [NHANES]    Frequency of Communication with Friends and Family: More than three times a week    Frequency of Social Gatherings with Friends and Family: More than three times a  week    Attends Religious Services: More than 4 times per year    Active Member of [employer] or Organizations: Yes    Attends Banker Meetings: More than 4 times per year    Marital Status: Divorced  Intimate Partner Violence: Not At Risk (06/10/2023)  Humiliation, Afraid, Rape, and Kick questionnaire    Fear of Current or Ex-Partner: No    Emotionally Abused: No    Physically Abused: No    Sexually Abused: No    Review of Systems:    Constitutional: No weight loss, fever, chills, weakness or fatigue HEENT: Eyes: No change in vision               Ears, Nose, Throat:  No change in hearing or congestion Skin: No rash or itching Cardiovascular: No chest pain, chest pressure or palpitations   Respiratory: No SOB or cough Gastrointestinal: See HPI and otherwise negative Genitourinary: No dysuria or change in urinary frequency Neurological: No headache, dizziness or syncope Musculoskeletal: No new muscle or joint pain Hematologic: No bleeding or bruising Psychiatric: No history of depression or anxiety    Physical Exam:  Vital signs: There were no vitals taken for this visit.  Constitutional: NAD, Well developed, Well nourished, alert and cooperative Head:  Normocephalic and atraumatic. Eyes:   PEERL, EOMI. No icterus. Conjunctiva pink. Respiratory: Respirations even and unlabored. Lungs clear to auscultation bilaterally.   No wheezes, crackles, or rhonchi.  Cardiovascular:  Regular rate and rhythm. No peripheral edema, cyanosis or pallor.  Gastrointestinal:  Soft, nondistended, nontender. No rebound or guarding. Normal bowel sounds. No appreciable masses or hepatomegaly. Rectal:  Not performed.  Msk:  Symmetrical without gross deformities. Without edema, no deformity or joint abnormality.  Neurologic:  Alert and  oriented x4;  grossly normal neurologically.  Skin:   Dry and intact without significant lesions or rashes. Psychiatric: Oriented to person, place and time.  Demonstrates good judgement and reason without abnormal affect or behaviors.  Physical Exam    RELEVANT LABS AND IMAGING: CBC    Component Value Date/Time   WBC 6.8 12/06/2022 1458   RBC 4.58 12/06/2022 1458   HGB 13.8 12/06/2022 1458   HCT 42.3 12/06/2022 1458   PLT 333.0 12/06/2022 1458   MCV 92.4 12/06/2022 1458   MCH 31.0 12/04/2021 0845   MCHC 32.5 12/06/2022 1458   RDW 12.9 12/06/2022 1458   LYMPHSABS 2.7 12/06/2022 1458   MONOABS 0.7 12/06/2022 1458   EOSABS 0.2 12/06/2022 1458   BASOSABS 0.1 12/06/2022 1458    CMP     Component Value Date/Time   NA 136 04/04/2023 1152   K 3.6 04/04/2023 1152   CL 98 04/04/2023 1152   CO2 29 04/04/2023 1152   GLUCOSE 94 04/04/2023 1152   BUN 19 04/04/2023 1152   CREATININE 0.62 04/04/2023 1152   CREATININE 0.56 (L) 02/25/2020 0903   CALCIUM 10.0 04/04/2023 1152   PROT 7.1 04/04/2023 1152   ALBUMIN 4.6 04/04/2023 1152   AST 20 04/04/2023 1152   ALT 10 04/04/2023 1152   ALKPHOS 68 04/04/2023 1152   BILITOT 0.4 04/04/2023 1152   GFRNONAA >60 12/04/2021 0845   GFRAA  01/02/2010 0751    >60        The eGFR has been calculated using the MDRD equation. This calculation has not been validated in all clinical situations. eGFR's persistently <60 mL/min signify possible Chronic Kidney Disease.     Assessment/Plan:   Assessment and Plan Assessment & Plan     GERD with history of esophageal stenosis EGD 05/2022 with Londa Rival dilation to [address] [place].  Alternating bowel habits Seen July 2024 and recommended fiber.  Colonoscopy in March 2022 with multiple diverticula, otherwise normal.  No repeat recommended.  Suzanna Erp, PA-C [facility] 07/10/2023, 9:56 PM  Cc: Marquetta Sit, MD

## 2023-07-11 ENCOUNTER — Encounter (HOSPITAL_BASED_OUTPATIENT_CLINIC_OR_DEPARTMENT_OTHER): Payer: Self-pay | Admitting: Student

## 2023-07-11 ENCOUNTER — Ambulatory Visit: Admitting: Gastroenterology

## 2023-07-11 ENCOUNTER — Ambulatory Visit (HOSPITAL_BASED_OUTPATIENT_CLINIC_OR_DEPARTMENT_OTHER)

## 2023-07-11 ENCOUNTER — Telehealth: Payer: Self-pay | Admitting: Orthopaedic Surgery

## 2023-07-11 ENCOUNTER — Ambulatory Visit (HOSPITAL_BASED_OUTPATIENT_CLINIC_OR_DEPARTMENT_OTHER): Admitting: Student

## 2023-07-11 ENCOUNTER — Encounter: Payer: Self-pay | Admitting: Gastroenterology

## 2023-07-11 ENCOUNTER — Other Ambulatory Visit (HOSPITAL_BASED_OUTPATIENT_CLINIC_OR_DEPARTMENT_OTHER): Payer: Self-pay | Admitting: Student

## 2023-07-11 VITALS — BP 120/88 | HR 88 | Ht <= 58 in | Wt 121.0 lb

## 2023-07-11 DIAGNOSIS — M25511 Pain in right shoulder: Secondary | ICD-10-CM

## 2023-07-11 DIAGNOSIS — K219 Gastro-esophageal reflux disease without esophagitis: Secondary | ICD-10-CM | POA: Diagnosis not present

## 2023-07-11 DIAGNOSIS — Z8719 Personal history of other diseases of the digestive system: Secondary | ICD-10-CM

## 2023-07-11 DIAGNOSIS — K625 Hemorrhage of anus and rectum: Secondary | ICD-10-CM

## 2023-07-11 DIAGNOSIS — K5900 Constipation, unspecified: Secondary | ICD-10-CM | POA: Diagnosis not present

## 2023-07-11 DIAGNOSIS — S46211A Strain of muscle, fascia and tendon of other parts of biceps, right arm, initial encounter: Secondary | ICD-10-CM

## 2023-07-11 DIAGNOSIS — M79621 Pain in right upper arm: Secondary | ICD-10-CM | POA: Diagnosis not present

## 2023-07-11 DIAGNOSIS — R1310 Dysphagia, unspecified: Secondary | ICD-10-CM | POA: Diagnosis not present

## 2023-07-11 DIAGNOSIS — M19011 Primary osteoarthritis, right shoulder: Secondary | ICD-10-CM | POA: Diagnosis not present

## 2023-07-11 MED ORDER — HYDROCORTISONE ACETATE 25 MG RE SUPP: 25.0000 mg | Freq: Two times a day (BID) | RECTAL | 0 refills | Status: DC

## 2023-07-11 MED ORDER — FAMOTIDINE 40 MG PO TABS: 40.0000 mg | ORAL_TABLET | Freq: Every day | ORAL | 3 refills | Status: DC

## 2023-07-11 NOTE — Patient Instructions (Addendum)
 Start taking Miralax 1 capful (17 grams) 1x / day for 1 week.   If this is not effective, increase to 1 dose 2x / day for 1 week.   If this is still not effective, increase to two capfuls (34 grams) 2x / day.   Can adjust dose as needed based on response. Can take 1/2 cap daily, skip days, or increase per day.    We have sent the following medications to your pharmacy for you to pick up at your convenience: Anusol suppositories, Famotidine 40mg  at bedtime   You have been scheduled for a Barium Esophogram at [facility] (1st floor of the hospital) on 07/12/23 at 10:00am. Please arrive 30 minutes prior to your appointment for registration. Make certain not to have anything to eat or drink 3 hours prior to your test. If you need to reschedule for any reason, please contact radiology at [phone] to do so. __________________________________________________________________ A barium swallow is an examination that concentrates on views of the esophagus. This tends to be a double contrast exam (barium and two liquids which, when combined, create a gas to distend the wall of the oesophagus) or single contrast (non-ionic iodine based). The study is usually tailored to your symptoms so a good history is essential. Attention is paid during the study to the form, structure and configuration of the esophagus, looking for functional disorders (such as aspiration, dysphagia, achalasia, motility and reflux) EXAMINATION You may be asked to change into a gown, depending on the type of swallow being performed. A radiologist and radiographer will perform the procedure. The radiologist will advise you of the type of contrast selected for your procedure and direct you during the exam. You will be asked to stand, sit or lie in several different positions and to hold a small amount of fluid in your mouth before being asked to swallow while the imaging is performed .In some instances you may be asked to swallow  barium coated marshmallows to assess the motility of a solid food bolus. The exam can be recorded as a digital or video fluoroscopy procedure. POST PROCEDURE It will take 1-2 days for the barium to pass through your system. To facilitate this, it is important, unless otherwise directed, to increase your fluids for the next 24-48hrs and to resume your normal diet.  This test typically takes about 30 minutes to perform. __________________________________________________________________________________  _______________________________________________________  If your blood pressure at your visit was 140/90 or greater, please contact your primary care physician to follow up on this.  _______________________________________________________  If you are age 37 or older, your body mass index should be between 23-30. Your Body mass index is 25.29 kg/m. If this is out of the aforementioned range listed, please consider follow up with your Primary Care Provider.  If you are age 76 or younger, your body mass index should be between 19-25. Your Body mass index is 25.29 kg/m. If this is out of the aformentioned range listed, please consider follow up with your Primary Care Provider.   ________________________________________________________  The [facility] providers would like to encourage you to use MYCHART to communicate with providers for non-urgent requests or questions.  Due to long hold times on the telephone, sending your provider a message by [facility] may be a faster and more efficient way to get a response.  Please allow 48 business hours for a response.  Please remember that this is for non-urgent requests.  _______________________________________________________ Thank you for trusting me with your gastrointestinal care!  Suzanna Erp, PA

## 2023-07-11 NOTE — Telephone Encounter (Signed)
 Patient called. Would like to speak with Heidi Shah. She had a pop in her arm. [phone]

## 2023-07-11 NOTE — Progress Notes (Signed)
 Noted.

## 2023-07-11 NOTE — Progress Notes (Signed)
 Chief Complaint: Right arm pain    Discussed the use of AI scribe software for clinical note transcription with the patient, who gave verbal consent to proceed.  History of Present Illness Heidi Shah, a 78 year old right-hand-dominant female with a history of two surgeries on her left shoulder including a reverse shoulder replacement, presents with a recent incident involving her right arm. She reports feeling a pop in her arm, not the shoulder, during a non-strenuous activity. Since then, she has noticed a bulge in her bicep area. She denies any associated pain or functional impairment. She manages any discomfort with ibuprofen as needed, which she reports is effective. 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    Surgical History:   Left reverse shoulder arthroplasty 12/06/2022  PMH/PSH/Family History/Social History/Meds/Allergies:    Past Medical History:  Diagnosis Date   Allergy    Anxiety    Arthritis    Borderline glaucoma    Cataract    surgery - Bilateral cataracts removed   Cervical spondylolysis    Chicken pox    Colon polyps    Depression    Diverticulitis    Diverticulosis    Esophageal spasm    Esophageal stricture    Family history of adverse reaction to anesthesia    difficult time waking mother up   GERD (gastroesophageal reflux disease)    Heart murmur    Hypertension    Osteoporosis    Traumatic complete tear of left rotator cuff    Past Surgical History:  Procedure Laterality Date   cataract surgery Bilateral 1996   COLONOSCOPY  2022   EYE SURGERY  2000   detached L retina   HERNIA REPAIR Bilateral 1990   inguinal   NECK SURGERY     age 48-trimmed a muscle due to not being able to hold her head up   POLYPECTOMY  2022   REVERSE SHOULDER ARTHROPLASTY Left 12/16/2022   Procedure: LEFT REVERSE SHOULDER ARTHROPLASTY;  Surgeon: Huel Cote, MD;  Location: [facility];  Service: Orthopedics;   Laterality: Left;   SHOULDER ARTHROSCOPY WITH ROTATOR CUFF REPAIR AND OPEN BICEPS TENODESIS Left 12/10/2021   Procedure: LEFT SHOULDER ARTHROSCOPY WITH ROTATOR CUFF REPAIR AND OPEN BICEPS TENODESIS;  Surgeon: Huel Cote, MD;  Location: [facility];  Service: Orthopedics;  Laterality: Left;   UPPER GASTROINTESTINAL ENDOSCOPY  2022   Social History   Socioeconomic History   Marital status: Divorced    Spouse name: Not on file   Number of children: 2   Years of education: master's degree   Highest education level: Master's degree (e.g., MA, MS, MEng, MEd, MSW, MBA)  Occupational History   Occupation: retired Runner, broadcasting/film/video  Tobacco Use   Smoking status: Never   Smokeless tobacco: Never  Vaping Use   Vaping status: Never Used  Substance and Sexual Activity   Alcohol use: Yes    Alcohol/week: 1.0 standard drink of alcohol    Types: 1 Glasses of wine per week    Comment: one or two glasses of wine a couple times a week   Drug use: No   Sexual activity: Not on file  Other Topics Concern   Not on file  Social History Narrative      Lives alone in ranch home.    2 children   Son  lives in [place]    Continues to teach virtually part time   Social Drivers of Health   Financial Resource Strain: Low Risk  (06/10/2023)   Overall Financial Resource Strain (CARDIA)    Difficulty of Paying Living Expenses: Not hard at all  Food Insecurity: No Food Insecurity (06/10/2023)   Hunger Vital Sign    Worried About Running Out of Food in the Last Year: Never true    Ran Out of Food in the Last Year: Never true  Transportation Needs: No Transportation Needs (06/10/2023)   PRAPARE - Administrator, Civil Service (Medical): No    Lack of Transportation (Non-Medical): No  Physical Activity: Sufficiently Active (06/10/2023)   Exercise Vital Sign    Days of Exercise per Week: 3 days    Minutes of Exercise per Session: 60 min  Stress: No Stress Concern Present (06/10/2023)   [employer]  of Occupational Health - Occupational Stress Questionnaire    Feeling of Stress : Not at all  Social Connections: Moderately Integrated (06/10/2023)   Social Connection and Isolation Panel [NHANES]    Frequency of Communication with Friends and Family: More than three times a week    Frequency of Social Gatherings with Friends and Family: More than three times a week    Attends Religious Services: More than 4 times per year    Active Member of [employer] or Organizations: Yes    Attends Engineer, structural: More than 4 times per year    Marital Status: Divorced   Family History  Problem Relation Age of Onset   Heart disease Mother 66   COPD Mother    Crohn's disease Mother    Heart disease Father 68   Cancer Father        kidney   Colon cancer Neg Hx    Esophageal cancer Neg Hx    Stomach cancer Neg Hx    Rectal cancer Neg Hx    Allergies  Allergen Reactions   Nickel Sulfate [Nickel] Rash    Per skin test from dermatologist   Beeswax     Per skin test from dermatologist   Cetrimonium Chloride [Cetrimide]     Per skin test from dermatologist   Lactose Intolerance (Gi) Diarrhea   Methylisothiazolinone     Per skin test from dermatologist   Neomycin Sulfate [Neomycin]     Per skin test from dermatologist   Penicillins     Childhood Reaction    Propolis     Per skin test from dermatologist   Current Outpatient Medications  Medication Sig Dispense Refill   alendronate (FOSAMAX) 70 MG tablet Take 1 tablet (70 mg total) by mouth every 7 (seven) days. Take with a full glass of water on an empty stomach. 4 tablet 11   amLODipine (NORVASC) 2.5 MG tablet TAKE ONE TABLET BY MOUTH ONE TIME DAILY 90 tablet 3   Biotin w/ Vitamins C & E (HAIR SKIN & NAILS GUMMIES PO) Take 2 each by mouth 4 (four) times a week.     Calcium Carb-Cholecalciferol (CALCIUM 600+D3 PO) Take 1 tablet by mouth daily.     cetirizine (ZYRTEC) 10 MG chewable tablet Chew 10 mg by mouth every morning.      cyanocobalamin (VITAMIN B12) 1000 MCG tablet Take 1,000 mcg by mouth daily.     famotidine (PEPCID) 40 MG tablet Take 1 tablet (40 mg total) by mouth at bedtime. 30 tablet 3   Ginkgo Biloba 60 MG CAPS Take  60 mg by mouth daily.     hydrocortisone (ANUSOL-HC) 25 MG suppository Place 1 suppository (25 mg total) rectally 2 (two) times daily. 28 suppository 0   lisinopril-hydrochlorothiazide (ZESTORETIC) 20-12.5 MG tablet TAKE ONE TABLET BY MOUTH ONE TIME DAILY 90 tablet 3   Multiple Vitamin (MULTI-VITAMIN) tablet Take 1 tablet by mouth 4 (four) times a week.     omeprazole (PRILOSEC) 40 MG capsule TAKE ONE CAPSULE BY MOUTH ONE TIME DAILY 90 capsule 1   vitamin C (ASCORBIC ACID) 500 MG tablet Take 500 mg by mouth 4 (four) times a week.     VYZULTA 0.024 % SOLN Place 1 drop into both eyes at bedtime.     No current facility-administered medications for this visit.   No results found.  Review of Systems:   A ROS was performed including pertinent positives and negatives as documented in the HPI.  Physical Exam :   Constitutional: NAD and appears stated age Neurological: Alert and oriented Psych: Appropriate affect and cooperative There were no vitals taken for this visit.   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  Active right shoulder range of motion to 160 degrees forward flexion, external rotation to 50 degrees, and internal rotation to T8.  Right elbow range of motion from 0 to 120 degrees with 5/5 strength in flexion and extension.  Good strength with forearm pronation and supination, although supination causes mild discomfort.  Distal neurosensory exam intact.  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  Negative for acute bony abnormality.   I personally reviewed and interpreted the radiographs.      Assessment & Plan Right biceps tendon rupture   Suspected proximal  rupture with a Popeye sign is noted. There is no significant pain or functional loss, as she maintains full shoulder range of motion and good strength with elbow flexion and forearm supination.  She does not express concern over the cosmetic appearance of the biceps.  She recently had an MRI of the right shoulder in January 2025 that did show severe intra-articular tendinosis of the biceps long head, in addition to a partial-thickness articular tear of the subscapularis and moderate supra and infraspinatus tendinosis.  Given that she maintains good function today, I have recommended a close follow-up with Dr. Hermina Loosen to discuss further treatment options, and in the meantime she can use the shoulder and arm as tolerated.     I personally saw and evaluated the patient, and participated in the management and treatment plan.  Sharrell Deck, PA-C Orthopedics

## 2023-07-11 NOTE — Telephone Encounter (Signed)
 Called and worked in today with [employer]

## 2023-07-12 ENCOUNTER — Ambulatory Visit (HOSPITAL_COMMUNITY)
Admission: RE | Admit: 2023-07-12 | Discharge: 2023-07-12 | Disposition: A | Discharge status: Home / Self Care | Source: Ambulatory Visit | Attending: Gastroenterology | Admitting: Gastroenterology

## 2023-07-12 ENCOUNTER — Other Ambulatory Visit: Payer: Self-pay | Admitting: Gastroenterology

## 2023-07-12 DIAGNOSIS — R1310 Dysphagia, unspecified: Secondary | ICD-10-CM | POA: Diagnosis not present

## 2023-07-12 DIAGNOSIS — K219 Gastro-esophageal reflux disease without esophagitis: Secondary | ICD-10-CM | POA: Diagnosis not present

## 2023-07-12 DIAGNOSIS — Z8719 Personal history of other diseases of the digestive system: Secondary | ICD-10-CM | POA: Insufficient documentation

## 2023-07-12 DIAGNOSIS — K259 Gastric ulcer, unspecified as acute or chronic, without hemorrhage or perforation: Secondary | ICD-10-CM | POA: Diagnosis not present

## 2023-07-12 DIAGNOSIS — K625 Hemorrhage of anus and rectum: Secondary | ICD-10-CM

## 2023-07-12 DIAGNOSIS — K5900 Constipation, unspecified: Secondary | ICD-10-CM

## 2023-07-12 DIAGNOSIS — K449 Diaphragmatic hernia without obstruction or gangrene: Secondary | ICD-10-CM | POA: Diagnosis not present

## 2023-07-29 ENCOUNTER — Ambulatory Visit (HOSPITAL_BASED_OUTPATIENT_CLINIC_OR_DEPARTMENT_OTHER): Admitting: Orthopaedic Surgery

## 2023-07-29 DIAGNOSIS — S46211A Strain of muscle, fascia and tendon of other parts of biceps, right arm, initial encounter: Secondary | ICD-10-CM | POA: Diagnosis not present

## 2023-07-29 NOTE — Progress Notes (Signed)
 Post Operative Evaluation    Procedure/Date of Surgery: Left shoulder reverse shoulder arthroplasty 9/19  Interval History:   Presents today for follow-up of the left shoulder.  Overall she is doing extremely well from the left shoulder.  With regard to the right shoulder she did feel a pop and this with subsequent positive Popeye sign consistent with a proximal biceps rupture.  She has had improvement in her pain since this happened   PMH/PSH/Family History/Social History/Meds/Allergies:    Past Medical History:  Diagnosis Date   Allergy     Anxiety    Arthritis    Borderline glaucoma    Cataract    surgery - Bilateral cataracts removed   Cervical spondylolysis    Chicken pox    Colon polyps    Depression    Diverticulitis    Diverticulosis    Esophageal spasm    Esophageal stricture    Family history of adverse reaction to anesthesia    difficult time waking mother up   GERD (gastroesophageal reflux disease)    Heart murmur    Hypertension    Osteoporosis    Traumatic complete tear of left rotator cuff    Past Surgical History:  Procedure Laterality Date   cataract surgery Bilateral 1996   COLONOSCOPY  2022   EYE SURGERY  2000   detached L retina   HERNIA REPAIR Bilateral 1990   inguinal   NECK SURGERY     age 74-trimmed a muscle due to not being able to hold her head up   POLYPECTOMY  2022   REVERSE SHOULDER ARTHROPLASTY Left 12/16/2022   Procedure: LEFT REVERSE SHOULDER ARTHROPLASTY;  Surgeon: Wilhelmenia Harada, MD;  Location: [facility];  Service: Orthopedics;  Laterality: Left;   SHOULDER ARTHROSCOPY WITH ROTATOR CUFF REPAIR AND OPEN BICEPS TENODESIS Left 12/10/2021   Procedure: LEFT SHOULDER ARTHROSCOPY WITH ROTATOR CUFF REPAIR AND OPEN BICEPS TENODESIS;  Surgeon: Wilhelmenia Harada, MD;  Location: [facility];  Service: Orthopedics;  Laterality: Left;   UPPER GASTROINTESTINAL ENDOSCOPY  2022   Social History   Socioeconomic History    Marital status: Divorced    Spouse name: Not on file   Number of children: 2   Years of education: master's degree   Highest education level: Master's degree (e.g., MA, MS, MEng, MEd, MSW, MBA)  Occupational History   Occupation: retired Runner, broadcasting/film/video  Tobacco Use   Smoking status: Never   Smokeless tobacco: Never  Vaping Use   Vaping status: Never Used  Substance and Sexual Activity   Alcohol use: Yes    Alcohol/week: 1.0 standard drink of alcohol    Types: 1 Glasses of wine per week    Comment: one or two glasses of wine a couple times a week   Drug use: No   Sexual activity: Not on file  Other Topics Concern   Not on file  Social History Narrative      Lives alone in ranch home.    2 children   Son lives in [place]    Continues to teach virtually part time   Social Drivers of Health   Financial Resource Strain: Low Risk  (06/10/2023)   Overall Financial Resource Strain (CARDIA)    Difficulty of Paying Living Expenses: Not hard at all  Food Insecurity: No Food Insecurity (06/10/2023)   Hunger  Vital Sign    Worried About Programme researcher, broadcasting/film/video in the Last Year: Never true    Ran Out of Food in the Last Year: Never true  Transportation Needs: No Transportation Needs (06/10/2023)   PRAPARE - Administrator, Civil Service (Medical): No    Lack of Transportation (Non-Medical): No  Physical Activity: Sufficiently Active (06/10/2023)   Exercise Vital Sign    Days of Exercise per Week: 3 days    Minutes of Exercise per Session: 60 min  Stress: No Stress Concern Present (06/10/2023)   [employer] of Occupational Health - Occupational Stress Questionnaire    Feeling of Stress : Not at all  Social Connections: Moderately Integrated (06/10/2023)   Social Connection and Isolation Panel [NHANES]    Frequency of Communication with Friends and Family: More than three times a week    Frequency of Social Gatherings with Friends and Family: More than three times a week     Attends Religious Services: More than 4 times per year    Active Member of [employer] or Organizations: Yes    Attends Engineer, structural: More than 4 times per year    Marital Status: Divorced   Family History  Problem Relation Age of Onset   Heart disease Mother 50   COPD Mother    Crohn's disease Mother    Heart disease Father 46   Cancer Father        kidney   Colon cancer Neg Hx    Esophageal cancer Neg Hx    Stomach cancer Neg Hx    Rectal cancer Neg Hx    Allergies  Allergen Reactions   Nickel Sulfate [Nickel] Rash    Per skin test from dermatologist   Beeswax     Per skin test from dermatologist   Cetrimonium Chloride [Cetrimide]     Per skin test from dermatologist   Lactose Intolerance (Gi) Diarrhea   Methylisothiazolinone     Per skin test from dermatologist   Neomycin Sulfate [Neomycin]     Per skin test from dermatologist   Penicillins     Childhood Reaction    Propolis     Per skin test from dermatologist   Current Outpatient Medications  Medication Sig Dispense Refill   alendronate  (FOSAMAX ) 70 MG tablet Take 1 tablet (70 mg total) by mouth every 7 (seven) days. Take with a full glass of water on an empty stomach. 4 tablet 11   amLODipine  (NORVASC ) 2.5 MG tablet TAKE ONE TABLET BY MOUTH ONE TIME DAILY 90 tablet 3   Biotin w/ Vitamins C & E (HAIR SKIN & NAILS GUMMIES PO) Take 2 each by mouth 4 (four) times a week.     Calcium Carb-Cholecalciferol (CALCIUM 600+D3 PO) Take 1 tablet by mouth daily.     cetirizine (ZYRTEC) 10 MG chewable tablet Chew 10 mg by mouth every morning.     cyanocobalamin (VITAMIN B12) 1000 MCG tablet Take 1,000 mcg by mouth daily.     famotidine  (PEPCID ) 40 MG tablet Take 1 tablet (40 mg total) by mouth at bedtime. 30 tablet 3   Ginkgo Biloba 60 MG CAPS Take 60 mg by mouth daily.     hydrocortisone  (ANUSOL -HC) 25 MG suppository Place 1 suppository (25 mg total) rectally 2 (two) times daily. 28 suppository 0    lisinopril -hydrochlorothiazide  (ZESTORETIC ) 20-12.5 MG tablet TAKE ONE TABLET BY MOUTH ONE TIME DAILY 90 tablet 3   Multiple Vitamin (MULTI-VITAMIN) tablet Take 1 tablet by  mouth 4 (four) times a week.     omeprazole  (PRILOSEC) 40 MG capsule TAKE ONE CAPSULE BY MOUTH ONE TIME DAILY 90 capsule 1   vitamin C (ASCORBIC ACID) 500 MG tablet Take 500 mg by mouth 4 (four) times a week.     VYZULTA 0.024 % SOLN Place 1 drop into both eyes at bedtime.     No current facility-administered medications for this visit.   No results found.   Review of Systems:   A ROS was performed including pertinent positives and negatives as documented in the HPI.   Musculoskeletal Exam:    There were no vitals taken for this visit.  Left incision is well-appearing without erythema or drainage.  In the spine position she is able to forward elevate to 135 with external rotation at the side to 45 actively.  Internal rotation to back pocket.  Distal neurosensory exam is intact  Right shoulder with tenderness to palpation about the lateral deltoid.  She has weakness 4 out of 5 with forward elevation.  Negative belly press.  External rotation at the side is to 45 bilaterally.  Forward elevation is to 170 with pain and weakness on the right.  Positive Neer impingement  Imaging:    3 views right shoulder: Status post left reverse shoulder arthroplasty without evidence of complication  MRI right shoulder: There is intrasubstance tearing of the rotator cuff without a discrete tear.,  Significant fluid around the biceps consistent with biceps tearing thickened coracoacromial ligament consistent subacromial impingement  I personally reviewed and interpreted the radiographs.   Assessment:   78 year old female with left shoulder arthroplasty overall doing extremely well.  With regard to the right I did discuss that she does have evidence now over the biceps tear which has completed proximally.  She is feeling much better  and to that effect I would not at this point recommend surgical intervention.  I did discuss that should she remain symptomatic she may be a candidate for subacromial decompression with collagen patch augmentation particularly given the biology on her contralateral side and progression of rotator cuff tear although that being said her symptoms are improving and as such I would defer surgery at this time  Plan :    -Plan for follow-up as needed     I personally saw and evaluated the patient, and participated in the management and treatment plan.  Wilhelmenia Harada, MD Attending Physician, Orthopedic Surgery  This document was dictated using Dragon voice recognition software. A reasonable attempt at proof reading has been made to minimize errors.

## 2023-08-09 ENCOUNTER — Ambulatory Visit
Admission: RE | Admit: 2023-08-09 | Discharge: 2023-08-09 | Disposition: A | Discharge status: Home / Self Care | Payer: Medicare HMO | Source: Ambulatory Visit | Attending: Family Medicine | Admitting: Family Medicine

## 2023-08-09 DIAGNOSIS — Z1231 Encounter for screening mammogram for malignant neoplasm of breast: Secondary | ICD-10-CM

## 2023-08-10 ENCOUNTER — Ambulatory Visit: Payer: Self-pay

## 2023-08-10 NOTE — Telephone Encounter (Signed)
 Copied from CRM [id number]. Topic: Clinical - Red Word Triage >> Aug 10, 2023 11:35 AM Artemio Larry wrote: Red Word that prompted transfer to Nurse Triage: patient hands, legs and ankles have been swollen for the past week   Chief Complaint: leg and ankle swelling; finger swelling Symptoms: swelling Frequency: intermittent Pertinent Negatives: Patient denies chest pain and redness Disposition: [] ED /[] Urgent Care (no appt availability in office) / [] Appointment(In office/virtual)/ []  [facility]/ [] Home Care/ [] Refused Recommended Disposition /[] [facility]/ []  Follow-up with PCP Additional Notes: Pt reports intermittent leg and ankle swelling x 1 week, appt scheduled for 5/16 Reason for Disposition  [1] MILD swelling of both ankles (i.e., pedal edema) AND [2] new-onset or worsening  Answer Assessment - Initial Assessment Questions 1. ONSET: "When did the swelling start?" (e.g., minutes, hours, days)     Last week   2. LOCATION: "What part of the leg is swollen?"  "Are both legs swollen or just one leg?"     Legs and ankles, both but more left than right  3. SEVERITY: "How bad is the swelling?" (e.g., localized; mild, moderate, severe)   - Localized: Small area of swelling localized to one leg.   - MILD pedal edema: Swelling limited to foot and ankle, pitting edema < 1/4 inch (6 mm) deep, rest and elevation eliminate most or all swelling.   - MODERATE edema: Swelling of lower leg to knee, pitting edema > 1/4 inch (6 mm) deep, rest and elevation only partially reduce swelling.   - SEVERE edema: Swelling extends above knee, facial or hand swelling present.      Mild swelling, more noticeable when on foot  4. REDNESS: "Does the swelling look red or infected?"     "I dont think its really red"  5. PAIN: "Is the swelling painful to touch?" If Yes, ask: "How painful is it?"   (Scale 1-10; mild, moderate or severe)     No  6. FEVER: "Do you have a fever?" If Yes, ask:  "What is it, how was it measured, and when did it start?"      No  7. CAUSE: "What do you think is causing the leg swelling?"     Unsure of cause  8. MEDICAL HISTORY: "Do you have a history of blood clots (e.g., DVT), cancer, heart failure, kidney disease, or liver failure?"     No but has family, kidney cancer and heart disease  9. RECURRENT SYMPTOM: "Have you had leg swelling before?" If Yes, ask: "When was the last time?" "What happened that time?"     Yes, when she was pregnant  10. OTHER SYMPTOMS: "Do you have any other symptoms?" (e.g., chest pain, difficulty breathing)       No  11. PREGNANCY: "Is there any chance you are pregnant?" "When was your last menstrual period?"       No  Protocols used: Leg Swelling and Edema-A-AH

## 2023-08-11 ENCOUNTER — Other Ambulatory Visit: Payer: Medicare HMO

## 2023-08-12 ENCOUNTER — Encounter: Payer: Self-pay | Admitting: Family Medicine

## 2023-08-12 ENCOUNTER — Ambulatory Visit (INDEPENDENT_AMBULATORY_CARE_PROVIDER_SITE_OTHER): Admitting: Family Medicine

## 2023-08-12 ENCOUNTER — Ambulatory Visit: Payer: Medicare HMO | Admitting: Family Medicine

## 2023-08-12 VITALS — BP 138/82 | HR 99 | Temp 98.2°F | Wt 122.4 lb

## 2023-08-12 DIAGNOSIS — I10 Essential (primary) hypertension: Secondary | ICD-10-CM

## 2023-08-12 DIAGNOSIS — R600 Localized edema: Secondary | ICD-10-CM | POA: Diagnosis not present

## 2023-08-12 MED ORDER — DILTIAZEM HCL ER COATED BEADS 120 MG PO CP24
120.0000 mg | ORAL_CAPSULE | Freq: Every day | ORAL | 1 refills | Status: AC
Start: 2023-08-12 — End: ?

## 2023-08-12 NOTE — Progress Notes (Signed)
 Established Patient Office Visit  Subjective   Patient ID: Heidi Shah, female    DOB: 21-Apr-1945  Age: 78 y.o. MRN: [id number]  Chief Complaint  Patient presents with   Edema    Patient complains of Hand and leg edema, x1 week    Tachycardia    HPI   Heidi Shah has history of hypertension, GERD, osteoarthritis involving multiple joints, aortic heart murmur, and past history of diverticulitis.  She had a couple of shoulder surgeries within the past year.  Stable from that standpoint.  She has had some intermittent bilateral lower extremity edema for several months.  Saw another provider here last December and was felt to have some venous stasis.  Has been using knee-high compression hose with mild improvement.  Medications do include amlodipine  2.5 mg daily in addition to lisinopril  HCTZ for hypertension.  Not monitoring blood pressures recently.  She has noted that her heart rate tends to be up somewhat at rest at times as high as 119.  She had normal TSH last October.  Possibly has some mild hand edema as well.  She has been taking nonsteroidals fairly frequently for her arthritic complaints.  Most recent albumin 4.6.  No orthopnea.  No dyspnea with day-to-day activities.  Only has dyspnea with extreme exertion.  No chest pain.  No history of heart failure.  Previous echo showed no major valve problems.  She had mild aortic valve sclerosis without evidence for aortic valve stenosis.  This was back in 2022.  She had grade 1 diastolic dysfunction.  Had c-Met back in January with GFR of 87.  Past Medical History:  Diagnosis Date   Allergy     Anxiety    Arthritis    Borderline glaucoma    Cataract    surgery - Bilateral cataracts removed   Cervical spondylolysis    Chicken pox    Colon polyps    Depression    Diverticulitis    Diverticulosis    Esophageal spasm    Esophageal stricture    Family history of adverse reaction to anesthesia    difficult time waking mother up    GERD (gastroesophageal reflux disease)    Heart murmur    Hypertension    Osteoporosis    Traumatic complete tear of left rotator cuff    Past Surgical History:  Procedure Laterality Date   cataract surgery Bilateral 1996   COLONOSCOPY  2022   EYE SURGERY  2000   detached L retina   HERNIA REPAIR Bilateral 1990   inguinal   NECK SURGERY     age 33-trimmed a muscle due to not being able to hold her head up   POLYPECTOMY  2022   REVERSE SHOULDER ARTHROPLASTY Left 12/16/2022   Procedure: LEFT REVERSE SHOULDER ARTHROPLASTY;  Surgeon: Wilhelmenia Harada, MD;  Location: [facility] ORS;  Service: Orthopedics;  Laterality: Left;   SHOULDER ARTHROSCOPY WITH ROTATOR CUFF REPAIR AND OPEN BICEPS TENODESIS Left 12/10/2021   Procedure: LEFT SHOULDER ARTHROSCOPY WITH ROTATOR CUFF REPAIR AND OPEN BICEPS TENODESIS;  Surgeon: Wilhelmenia Harada, MD;  Location: [facility];  Service: Orthopedics;  Laterality: Left;   UPPER GASTROINTESTINAL ENDOSCOPY  2022    reports that she has never smoked. She has never used smokeless tobacco. She reports current alcohol use of about 1.0 standard drink of alcohol per week. She reports that she does not use drugs. family history includes COPD in her mother; Cancer in her father; Crohn's disease in her mother; Heart disease (age of onset:  60) in her mother; Heart disease (age of onset: 61) in her father. Allergies  Allergen Reactions   Nickel Sulfate [Nickel] Rash    Per skin test from dermatologist   Beeswax     Per skin test from dermatologist   Cetrimonium Chloride [Cetrimide]     Per skin test from dermatologist   Lactose Intolerance (Gi) Diarrhea   Methylisothiazolinone     Per skin test from dermatologist   Neomycin Sulfate [Neomycin]     Per skin test from dermatologist   Penicillins     Childhood Reaction    Propolis     Per skin test from dermatologist    Review of Systems  Constitutional:  Negative for malaise/fatigue and weight loss.  Eyes:  Negative for blurred  vision.  Respiratory:  Negative for shortness of breath.   Cardiovascular:  Positive for leg swelling. Negative for chest pain, palpitations and orthopnea.  Neurological:  Negative for dizziness, weakness and headaches.      Objective:     BP 138/82 (BP Location: Left Arm, Patient Position: Sitting, Cuff Size: Normal)   Pulse 99   Temp 98.2 F (36.8 C) (Oral)   Wt 122 lb 6.4 oz (55.5 kg)   SpO2 96%   BMI 25.58 kg/m  BP Readings from Last 3 Encounters:  08/12/23 138/82  07/11/23 120/88  06/10/23 120/62   Wt Readings from Last 3 Encounters:  08/12/23 122 lb 6.4 oz (55.5 kg)  07/11/23 121 lb (54.9 kg)  06/10/23 116 lb 14.4 oz (53 kg)      Physical Exam Vitals reviewed.  Constitutional:      General: She is not in acute distress.    Appearance: She is not ill-appearing.  Cardiovascular:     Rate and Rhythm: Normal rate and regular rhythm.     Heart sounds: Murmur heard.     Comments: Current heart rate at rest palpated 88-90 and regular 2/6 to 3/6 systolic ejection murmur right upper sternal border over aortic valve. Pulmonary:     Effort: Pulmonary effort is normal.     Breath sounds: Normal breath sounds. No wheezing or rales.  Musculoskeletal:     Comments: No pitting edema of the legs  Neurological:     General: No focal deficit present.     Mental Status: She is alert.      No results found for any visits on 08/12/23.  Last CBC Lab Results  Component Value Date   WBC 6.8 12/06/2022   HGB 13.8 12/06/2022   HCT 42.3 12/06/2022   MCV 92.4 12/06/2022   MCH 31.0 12/04/2021   RDW 12.9 12/06/2022   PLT 333.0 12/06/2022   Last metabolic panel Lab Results  Component Value Date   GLUCOSE 94 04/04/2023   NA 136 04/04/2023   K 3.6 04/04/2023   CL 98 04/04/2023   CO2 29 04/04/2023   BUN 19 04/04/2023   CREATININE 0.62 04/04/2023   GFR 87.28 04/04/2023   CALCIUM 10.0 04/04/2023   PROT 7.1 04/04/2023   ALBUMIN 4.6 04/04/2023   BILITOT 0.4 04/04/2023    ALKPHOS 68 04/04/2023   AST 20 04/04/2023   ALT 10 04/04/2023   ANIONGAP 8 12/04/2021   Last lipids Lab Results  Component Value Date   CHOL 258 (H) 04/04/2023   HDL 78.70 04/04/2023   LDLCALC 165 (H) 04/04/2023   LDLDIRECT 162.6 12/20/2012   TRIG 70.0 04/04/2023   CHOLHDL 3 04/04/2023   Last thyroid  functions Lab Results  Component  Value Date   TSH 0.75 01/03/2023      The 10-year ASCVD risk score (Arnett DK, et al., 2019) is: 24.7%    Assessment & Plan:   #1 bilateral leg edema.  Symptoms are relatively mild.  No pitting edema on exam today.  Suspect component of diastolic dysfunction and also patient on amlodipine  though low dosage.  We discussed various causes of leg edema including venous stasis.  She does not have any obvious significant varicosities.  We discussed the following  -Try to reduce or avoid nonsteroidals as much as possible - Try to keep daily sodium intake less than 2500 mg - Discontinue amlodipine  and switch to Cardizem CD 120 mg daily.  May still have some vasodilation but hopefully less - Continue knee-high compression hose - Set up 1 month follow-up - If edema not improving at follow-up consider follow-up labs with basic metabolic panel, TSH, urine microalbumin screen, BNP  #2 hypertension.  Continue lisinopril  HCTZ.  Switching from amlodipine  to Cardizem as above.  She feels like her baseline pulse has been elevated somewhat recently and this may help slightly.  Try to avoid nonsteroidals as much as possible  Glean Lamy, MD

## 2023-08-12 NOTE — Patient Instructions (Signed)
 Stop the Amlodipine    Start the Cardizem CD 120 mg one daily  Avoid NSAIDS as much as possible.   Set up one month follow up

## 2023-08-24 NOTE — Progress Notes (Signed)
 Heidi Shah [address] [place] [address] Phone: [phone] Subjective:   Heidi Shah, am serving as a scribe for Dr. Ronnell Shah.  I'm seeing this patient by the request  of:  Heidi Sit, MD  CC: osteoporosis f/u  [id number]  05/12/2023 We discussed at great length about osteoporosis, the progression, as well as different medications.  Patient has done Prolia  previously.  Has been off of it as well.  Is looking to go back to the Fosamax .  I do think potentially trying that for 2 years with patient just barely being in the osteoporotic aspect would be beneficial.  Discussed with patient at great length of other potential treatment options as well as different things such as physical therapy and vibration place that could be beneficial.  Patient wants to continue with all of these.  Will follow-up with me again in 3 months.  Total time with patient as well as reviewing bone density was 47 minutes     Update 09/01/2023 Heidi Shah is a 78 y.o. female coming in with complaint of bpne health management. Some lower back pain.  Patient was to continue to work with physical therapy and do see the notes from that.  Recently saw another provider for a bicep tendon rupture.  Her and the provider have decided on conservative therapy.  Patient states has been taking Shah. Not much of a change.     Movement  Past Medical History:  Diagnosis Date   Allergy     Anxiety    Arthritis    Borderline glaucoma    Cataract    surgery - Bilateral cataracts removed   Cervical spondylolysis    Chicken pox    Colon polyps    Depression    Diverticulitis    Diverticulosis    Esophageal spasm    Esophageal stricture    Family history of adverse reaction to anesthesia    difficult time waking mother up   GERD (gastroesophageal reflux disease)    Heart murmur    Hypertension    Osteoporosis    Traumatic complete tear of left rotator cuff     Past Surgical History:  Procedure Laterality Date   cataract surgery Bilateral 1996   COLONOSCOPY  2022   EYE SURGERY  2000   detached L retina   HERNIA REPAIR Bilateral 1990   inguinal   NECK SURGERY     age 74-trimmed a muscle due to not being able to hold her head up   POLYPECTOMY  2022   REVERSE SHOULDER ARTHROPLASTY Left 12/16/2022   Procedure: LEFT REVERSE SHOULDER ARTHROPLASTY;  Surgeon: Wilhelmenia Harada, MD;  Location: [facility] ORS;  Service: Orthopedics;  Laterality: Left;   SHOULDER ARTHROSCOPY WITH ROTATOR CUFF REPAIR AND OPEN BICEPS TENODESIS Left 12/10/2021   Procedure: LEFT SHOULDER ARTHROSCOPY WITH ROTATOR CUFF REPAIR AND OPEN BICEPS TENODESIS;  Surgeon: Wilhelmenia Harada, MD;  Location: [facility];  Service: Orthopedics;  Laterality: Left;   UPPER GASTROINTESTINAL ENDOSCOPY  2022   Social History   Socioeconomic History   Marital status: Divorced    Spouse name: Not on file   Number of children: 2   Years of education: master's degree   Highest education level: Master's degree (e.g., MA, MS, MEng, MEd, MSW, MBA)  Occupational History   Occupation: retired Runner, broadcasting/film/video  Tobacco Use   Smoking status: Never   Smokeless tobacco: Never  Vaping Use   Vaping status: Never Used  Substance and Sexual  Activity   Alcohol use: Yes    Alcohol/week: 1.0 standard drink of alcohol    Types: 1 Glasses of wine per week    Comment: one or two glasses of wine a couple times a week   Drug use: No   Sexual activity: Not on file  Other Topics Concern   Not on file  Social History Narrative      Lives alone in ranch home.    2 children   Son lives in [place]    Continues to teach virtually part time   Social Drivers of Health   Financial Resource Strain: Low Risk  (06/10/2023)   Overall Financial Resource Strain (CARDIA)    Difficulty of Paying Living Expenses: Not hard at all  Food Insecurity: No Food Insecurity (06/10/2023)   Hunger Vital Sign    Worried About Running Out of Food in  the Last Year: Never true    Ran Out of Food in the Last Year: Never true  Transportation Needs: No Transportation Needs (06/10/2023)   PRAPARE - Administrator, Civil Service (Medical): No    Lack of Transportation (Non-Medical): No  Physical Activity: Sufficiently Active (06/10/2023)   Exercise Vital Sign    Days of Exercise per Week: 3 days    Minutes of Exercise per Session: 60 min  Stress: No Stress Concern Present (06/10/2023)   Heidi Shah [employer] Occupational Health - Occupational Stress Questionnaire    Feeling of Stress : Not at all  Social Connections: Moderately Integrated (06/10/2023)   Social Connection and Isolation Panel [NHANES]    Frequency of Communication with Friends and Family: More than three times a week    Frequency of Social Gatherings with Friends and Family: More than three times a week    Attends Religious Services: More than 4 times per year    Active Member of [employer] or Organizations: Yes    Attends Engineer, structural: More than 4 times per year    Marital Status: Divorced   Allergies  Allergen Reactions   Nickel Sulfate [Nickel] Rash    Per skin test from dermatologist   Beeswax     Per skin test from dermatologist   Cetrimonium Chloride [Cetrimide]     Per skin test from dermatologist   Lactose Intolerance (Gi) Diarrhea   Methylisothiazolinone     Per skin test from dermatologist   Neomycin Sulfate [Neomycin]     Per skin test from dermatologist   Penicillins     Childhood Reaction    Propolis     Per skin test from dermatologist   Family History  Problem Relation Age of Onset   Heart disease Mother 42   COPD Mother    Crohn's disease Mother    Heart disease Father 49   Cancer Father        kidney   Colon cancer Neg Hx    Esophageal cancer Neg Hx    Stomach cancer Neg Hx    Rectal cancer Neg Hx     Current Outpatient Medications (Endocrine & Metabolic):    alendronate  (FOSAMAX ) 70 MG tablet, Take 1 tablet (70 mg  total) by mouth every 7 (seven) days. Take with a full glass of water on an empty stomach.  Current Outpatient Medications (Cardiovascular):    diltiazem  (CARDIZEM  CD) 120 MG 24 hr capsule, Take 1 capsule (120 mg total) by mouth daily.   lisinopril -hydrochlorothiazide  (ZESTORETIC ) 20-12.5 MG tablet, TAKE ONE TABLET BY MOUTH ONE TIME DAILY  Current Outpatient Medications (Respiratory):    cetirizine (ZYRTEC) 10 MG chewable tablet, Chew 10 mg by mouth every morning.   Current Outpatient Medications (Hematological):    cyanocobalamin (VITAMIN B12) 1000 MCG tablet, Take 1,000 mcg by mouth daily.  Current Outpatient Medications (Other):    Biotin w/ Vitamins C & E (HAIR SKIN & NAILS GUMMIES PO), Take 2 each by mouth 4 (four) times a week.   Calcium Carb-Cholecalciferol (CALCIUM 600+D3 PO), Take 1 tablet by mouth daily.   Ginkgo Biloba 60 MG CAPS, Take 60 mg by mouth daily.   Multiple Vitamin (MULTI-VITAMIN) tablet, Take 1 tablet by mouth 4 (four) times a week.   omeprazole  (PRILOSEC) 40 MG capsule, TAKE ONE CAPSULE BY MOUTH ONE TIME DAILY   vitamin C (ASCORBIC ACID) 500 MG tablet, Take 500 mg by mouth 4 (four) times a week.   VYZULTA 0.024 % SOLN, Place 1 drop into both eyes at bedtime.   Reviewed prior external information including notes and imaging from  primary care provider As well as notes that were available from care everywhere and other healthcare systems.  Past medical history, social, surgical and family history all reviewed in electronic medical record.  No pertanent information unless stated regarding to the chief complaint.   Review of Systems:  No headache, visual changes, nausea, vomiting, diarrhea, constipation, dizziness, abdominal pain, skin rash, fevers, chills, night sweats, weight loss, swollen lymph nodes, body aches, joint swelling, chest pain, shortness of breath, mood changes. POSITIVE muscle aches  Objective  Blood pressure 108/74, pulse 86, height 4\' 10"  (1.473  m), weight 122 lb (55.3 kg), SpO2 97%.   General: No apparent distress alert and oriented x3 mood and affect normal, dressed appropriately.  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  Patient does have well only 5 degrees of extension of the back with some increasing discomfort and does have some radicular symptoms going down the left leg.  Weakness noted with dorsi flexion of 4 out of 5 strength on the left foot.    Impression and Recommendations:    The above documentation has been reviewed and is accurate and complete Nicie Milan M Miamor Ayler, DO

## 2023-09-01 ENCOUNTER — Ambulatory Visit (INDEPENDENT_AMBULATORY_CARE_PROVIDER_SITE_OTHER)

## 2023-09-01 ENCOUNTER — Ambulatory Visit: Admitting: Family Medicine

## 2023-09-01 VITALS — BP 108/74 | HR 86 | Ht <= 58 in | Wt 122.0 lb

## 2023-09-01 DIAGNOSIS — M4150 Other secondary scoliosis, site unspecified: Secondary | ICD-10-CM

## 2023-09-01 DIAGNOSIS — M47816 Spondylosis without myelopathy or radiculopathy, lumbar region: Secondary | ICD-10-CM | POA: Diagnosis not present

## 2023-09-01 DIAGNOSIS — M545 Low back pain, unspecified: Secondary | ICD-10-CM

## 2023-09-01 NOTE — Patient Instructions (Signed)
 Heel lift to left shoe Do prescribed exercises at least 3x a week [facility] [phone] Call Today  When we receive your results we will contact you.

## 2023-09-01 NOTE — Assessment & Plan Note (Signed)
 Significant of the lower back with radicular symptoms at this time now on the left leg.  Affecting daily activities that did not even affect getting dressed.  At this point I do think that advanced imaging with an MRI would be beneficial and patient could be a candidate for the possibility of an epidural.  Patient would like to be more active especially with the osteoporosis.  After the imaging we will discuss with patient and discuss the next treatment options.

## 2023-09-07 ENCOUNTER — Encounter: Payer: Self-pay | Admitting: Family Medicine

## 2023-09-08 ENCOUNTER — Ambulatory Visit: Payer: Self-pay | Admitting: Family Medicine

## 2023-09-12 ENCOUNTER — Ambulatory Visit (INDEPENDENT_AMBULATORY_CARE_PROVIDER_SITE_OTHER): Admitting: Family Medicine

## 2023-09-12 ENCOUNTER — Encounter: Payer: Self-pay | Admitting: Family Medicine

## 2023-09-12 ENCOUNTER — Ambulatory Visit
Admission: RE | Admit: 2023-09-12 | Discharge: 2023-09-12 | Disposition: A | Discharge status: Home / Self Care | Source: Ambulatory Visit | Attending: Family Medicine | Admitting: Family Medicine

## 2023-09-12 ENCOUNTER — Ambulatory Visit: Admitting: Family Medicine

## 2023-09-12 VITALS — BP 146/84 | HR 90 | Temp 98.1°F | Wt 119.8 lb

## 2023-09-12 DIAGNOSIS — M48061 Spinal stenosis, lumbar region without neurogenic claudication: Secondary | ICD-10-CM | POA: Diagnosis not present

## 2023-09-12 DIAGNOSIS — I10 Essential (primary) hypertension: Secondary | ICD-10-CM | POA: Diagnosis not present

## 2023-09-12 DIAGNOSIS — R600 Localized edema: Secondary | ICD-10-CM

## 2023-09-12 DIAGNOSIS — M545 Low back pain, unspecified: Secondary | ICD-10-CM

## 2023-09-12 DIAGNOSIS — M47816 Spondylosis without myelopathy or radiculopathy, lumbar region: Secondary | ICD-10-CM | POA: Diagnosis not present

## 2023-09-12 MED ORDER — DILTIAZEM HCL ER COATED BEADS 180 MG PO CP24: 180.0000 mg | ORAL_CAPSULE | Freq: Every day | ORAL | 5 refills | Status: DC

## 2023-09-12 NOTE — Patient Instructions (Signed)
 We are increasing your Cardizem  CD to 180 mg   Set up one month follow up and bring in your cuff at follow up.

## 2023-09-12 NOTE — Progress Notes (Signed)
 Established Patient Office Visit  Subjective   Patient ID: Heidi Shah, female    DOB: 1945/06/24  Age: 78 y.o. MRN: [id number]  Chief Complaint  Patient presents with   Medical Management of Chronic Issues    HPI   Heidi Shah is seen today for follow-up regarding bilateral leg edema and hypertension.  Refer to last note for details.  We stopped her amlodipine  and switch to Cardizem  CD 120 mg daily.  Her leg edema has essentially resolved.  She has no dyspnea.  She has home blood pressure monitor but has not been monitoring blood pressure recently.  Denies any side effects from Cardizem .  She also remains on lisinopril  HCTZ.  Compliant with therapy.  She is having some ongoing back difficulties.  Had recent lumbar films which showed severe scoliosis changes.  She has MRI scan later today.  She has been doing some home stretches.  She is hoping there may be some sort of injection therapy that could help her back pain.  Past Medical History:  Diagnosis Date   Allergy     Anxiety    Arthritis    Borderline glaucoma    Cataract    surgery - Bilateral cataracts removed   Cervical spondylolysis    Chicken pox    Colon polyps    Depression    Diverticulitis    Diverticulosis    Esophageal spasm    Esophageal stricture    Family history of adverse reaction to anesthesia    difficult time waking mother up   GERD (gastroesophageal reflux disease)    Heart murmur    Hypertension    Osteoporosis    Traumatic complete tear of left rotator cuff    Past Surgical History:  Procedure Laterality Date   cataract surgery Bilateral 1996   COLONOSCOPY  2022   EYE SURGERY  2000   detached L retina   HERNIA REPAIR Bilateral 1990   inguinal   NECK SURGERY     age 81-trimmed a muscle due to not being able to hold her head up   POLYPECTOMY  2022   REVERSE SHOULDER ARTHROPLASTY Left 12/16/2022   Procedure: LEFT REVERSE SHOULDER ARTHROPLASTY;  Surgeon: Heidi Harada, MD;  Location: [facility]  ORS;  Service: Orthopedics;  Laterality: Left;   SHOULDER ARTHROSCOPY WITH ROTATOR CUFF REPAIR AND OPEN BICEPS TENODESIS Left 12/10/2021   Procedure: LEFT SHOULDER ARTHROSCOPY WITH ROTATOR CUFF REPAIR AND OPEN BICEPS TENODESIS;  Surgeon: Heidi Harada, MD;  Location: [facility] OR;  Service: Orthopedics;  Laterality: Left;   UPPER GASTROINTESTINAL ENDOSCOPY  2022    reports that she has never smoked. She has never used smokeless tobacco. She reports current alcohol use of about 1.0 standard drink of alcohol per week. She reports that she does not use drugs. family history includes COPD in her mother; Cancer in her father; Crohn's disease in her mother; Heart disease (age of onset: 102) in her mother; Heart disease (age of onset: 50) in her father. Allergies  Allergen Reactions   Nickel Sulfate [Nickel] Rash    Per skin test from dermatologist   Beeswax     Per skin test from dermatologist   Cetrimonium Chloride [Cetrimide]     Per skin test from dermatologist   Lactose Intolerance (Gi) Diarrhea   Methylisothiazolinone     Per skin test from dermatologist   Neomycin Sulfate [Neomycin]     Per skin test from dermatologist   Penicillins     Childhood Reaction    Propolis  Per skin test from dermatologist    Review of Systems  Constitutional:  Negative for chills, fever and malaise/fatigue.  Eyes:  Negative for blurred vision.  Respiratory:  Negative for shortness of breath.   Cardiovascular:  Negative for chest pain.  Neurological:  Negative for dizziness, weakness and headaches.      Objective:     BP (!) 146/84 (BP Location: Left Arm, Patient Position: Sitting, Cuff Size: Normal)   Pulse 90   Temp 98.1 F (36.7 C) (Oral)   Wt 119 lb 12.8 oz (54.3 kg)   SpO2 96%   BMI 25.04 kg/m  BP Readings from Last 3 Encounters:  09/12/23 (!) 146/84  09/01/23 108/74  08/12/23 138/82   Wt Readings from Last 3 Encounters:  09/12/23 119 lb 12.8 oz (54.3 kg)  09/01/23 122 lb (55.3 kg)   08/12/23 122 lb 6.4 oz (55.5 kg)      Physical Exam Vitals reviewed.  Constitutional:      General: She is not in acute distress.    Appearance: She is well-developed. She is not ill-appearing.   Eyes:     Pupils: Pupils are equal, round, and reactive to light.   Neck:     Thyroid : No thyromegaly.     Vascular: No JVD.   Cardiovascular:     Rate and Rhythm: Normal rate and regular rhythm.     Heart sounds: Murmur heard.     No gallop.     Comments: Soft systolic murmur aortic valve Pulmonary:     Effort: Pulmonary effort is normal. No respiratory distress.     Breath sounds: Normal breath sounds. No wheezing or rales.   Musculoskeletal:     Cervical back: Neck supple.     Right lower leg: No edema.     Left lower leg: No edema.   Neurological:     Mental Status: She is alert.      No results found for any visits on 09/12/23.  Last CBC Lab Results  Component Value Date   WBC 6.8 12/06/2022   HGB 13.8 12/06/2022   HCT 42.3 12/06/2022   MCV 92.4 12/06/2022   MCH 31.0 12/04/2021   RDW 12.9 12/06/2022   PLT 333.0 12/06/2022   Last metabolic panel Lab Results  Component Value Date   GLUCOSE 94 04/04/2023   NA 136 04/04/2023   K 3.6 04/04/2023   CL 98 04/04/2023   CO2 29 04/04/2023   BUN 19 04/04/2023   CREATININE 0.62 04/04/2023   GFR 87.28 04/04/2023   CALCIUM 10.0 04/04/2023   PROT 7.1 04/04/2023   ALBUMIN 4.6 04/04/2023   BILITOT 0.4 04/04/2023   ALKPHOS 68 04/04/2023   AST 20 04/04/2023   ALT 10 04/04/2023   ANIONGAP 8 12/04/2021   Last lipids Lab Results  Component Value Date   CHOL 258 (H) 04/04/2023   HDL 78.70 04/04/2023   LDLCALC 165 (H) 04/04/2023   LDLDIRECT 162.6 12/20/2012   TRIG 70.0 04/04/2023   CHOLHDL 3 04/04/2023   Last thyroid  functions Lab Results  Component Value Date   TSH 0.75 01/03/2023      The 10-year ASCVD risk score (Arnett DK, et al., 2019) is: 27.2%    Assessment & Plan:   #1 bilateral leg edema  essentially resolved.  She is now off amlodipine  which may be helping somewhat.  Continue to watch sodium intake.  Be in touch for any recurrence  #2 hypertension poorly controlled.  We recommended increasing her Cardizem  CD  to 180 mg daily.  Continue lisinopril  HCTZ.  Continue low-sodium diet.  Continue regular walking for exercise.  Set up 1 month follow-up.  Would also suggest that she bring in her cuff for comparison with ours next visit and get some home readings in the meantime  Glean Lamy, MD

## 2023-09-14 ENCOUNTER — Other Ambulatory Visit

## 2023-09-19 ENCOUNTER — Ambulatory Visit: Admitting: Gastroenterology

## 2023-09-22 ENCOUNTER — Other Ambulatory Visit: Payer: Self-pay

## 2023-09-22 DIAGNOSIS — M5416 Radiculopathy, lumbar region: Secondary | ICD-10-CM

## 2023-09-26 ENCOUNTER — Encounter: Payer: Self-pay | Admitting: Family Medicine

## 2023-09-29 ENCOUNTER — Ambulatory Visit
Admission: RE | Admit: 2023-09-29 | Discharge: 2023-09-29 | Disposition: A | Discharge status: Home / Self Care | Source: Ambulatory Visit | Attending: Family Medicine | Admitting: Family Medicine

## 2023-09-29 DIAGNOSIS — M48061 Spinal stenosis, lumbar region without neurogenic claudication: Secondary | ICD-10-CM | POA: Diagnosis not present

## 2023-09-29 DIAGNOSIS — M5416 Radiculopathy, lumbar region: Secondary | ICD-10-CM | POA: Diagnosis not present

## 2023-09-29 MED ORDER — METHYLPREDNISOLONE ACETATE 40 MG/ML INJ SUSP (RADIOLOG
80.0000 mg | Freq: Once | INTRAMUSCULAR | Status: AC
  Administered 2023-09-29: 80 mg via EPIDURAL

## 2023-09-29 MED ORDER — IOPAMIDOL (ISOVUE-M 200) INJECTION 41%
1.0000 mL | Freq: Once | INTRAMUSCULAR | Status: AC
  Administered 2023-09-29: 1 mL via EPIDURAL

## 2023-09-29 NOTE — Discharge Instructions (Signed)

## 2023-10-10 DIAGNOSIS — H401131 Primary open-angle glaucoma, bilateral, mild stage: Secondary | ICD-10-CM | POA: Diagnosis not present

## 2023-10-12 ENCOUNTER — Encounter: Payer: Self-pay | Admitting: Family Medicine

## 2023-10-12 ENCOUNTER — Ambulatory Visit (INDEPENDENT_AMBULATORY_CARE_PROVIDER_SITE_OTHER): Admitting: Family Medicine

## 2023-10-12 VITALS — BP 124/78 | HR 89 | Temp 98.2°F | Wt 120.4 lb

## 2023-10-12 DIAGNOSIS — I10 Essential (primary) hypertension: Secondary | ICD-10-CM | POA: Diagnosis not present

## 2023-10-12 NOTE — Patient Instructions (Signed)
 Let me know if BP consistently > 140 systolic (top number).

## 2023-10-12 NOTE — Progress Notes (Signed)
 Established Patient Office Visit  Subjective   Patient ID: Heidi Shah, female    DOB: [birth year]  Age: 78 y.o. MRN: [id number]  Chief Complaint  Patient presents with   Annual Exam    HPI   Heidi Shah is seen for follow-up hypertension.  She had several recent elevations and we increased her diltiazem  from 120 to 180 mg.  She also takes lisinopril  HCTZ 20/12.5 mg daily.  We had asked that she bring in her cuff to compare with ours.  Most of her home blood pressures have been favorable with 120s to 130s systolic.  Rare readings over 140.  She has increased her walking and currently walking about an hour a few times per week.  She hopes to increase frequency.  No peripheral edema.  No chest pains or dizziness.  She is trying to watch sodium intake closely.  Past Medical History:  Diagnosis Date   Allergy     Anxiety    Arthritis    Borderline glaucoma    Cataract    surgery - Bilateral cataracts removed   Cervical spondylolysis    Chicken pox    Colon polyps    Depression    Diverticulitis    Diverticulosis    Esophageal spasm    Esophageal stricture    Family history of adverse reaction to anesthesia    difficult time waking mother up   GERD (gastroesophageal reflux disease)    Heart murmur    Hypertension    Osteoporosis    Traumatic complete tear of left rotator cuff    Past Surgical History:  Procedure Laterality Date   cataract surgery Bilateral 1996   COLONOSCOPY  2022   EYE SURGERY  2000   detached L retina   HERNIA REPAIR Bilateral 1990   inguinal   NECK SURGERY     age 66-trimmed a muscle due to not being able to hold her head up   POLYPECTOMY  2022   REVERSE SHOULDER ARTHROPLASTY Left 12/16/2022   Procedure: LEFT REVERSE SHOULDER ARTHROPLASTY;  Surgeon: Genelle Standing, MD;  Location: [facility];  Service: Orthopedics;  Laterality: Left;   SHOULDER ARTHROSCOPY WITH ROTATOR CUFF REPAIR AND OPEN BICEPS TENODESIS Left 12/10/2021   Procedure: LEFT SHOULDER  ARTHROSCOPY WITH ROTATOR CUFF REPAIR AND OPEN BICEPS TENODESIS;  Surgeon: Genelle Standing, MD;  Location: [facility];  Service: Orthopedics;  Laterality: Left;   UPPER GASTROINTESTINAL ENDOSCOPY  2022    reports that she has never smoked. She has never used smokeless tobacco. She reports current alcohol use of about 1.0 standard drink of alcohol per week. She reports that she does not use drugs. family history includes COPD in her mother; Cancer in her father; Crohn's disease in her mother; Heart disease (age of onset: 48) in her mother; Heart disease (age of onset: 43) in her father. Allergies  Allergen Reactions   Nickel Sulfate [Nickel] Rash    Per skin test from dermatologist   Beeswax     Per skin test from dermatologist   Cetrimonium Chloride [Cetrimide]     Per skin test from dermatologist   Lactose Intolerance (Gi) Diarrhea   Methylisothiazolinone     Per skin test from dermatologist   Neomycin Sulfate [Neomycin]     Per skin test from dermatologist   Penicillins     Childhood Reaction    Propolis     Per skin test from dermatologist    Review of Systems  Constitutional:  Negative for malaise/fatigue.  Eyes:  Negative for blurred vision.  Respiratory:  Negative for shortness of breath.   Cardiovascular:  Negative for chest pain.  Neurological:  Negative for dizziness, weakness and headaches.      Objective:     BP 124/78 (BP Location: Left Arm, Cuff Size: Normal)   Pulse 89   Temp 98.2 F (36.8 C) (Oral)   Wt 120 lb 6.4 oz (54.6 kg)   SpO2 96%   BMI 25.16 kg/m  BP Readings from Last 3 Encounters:  10/12/23 124/78  09/29/23 (!) 127/92  09/12/23 (!) 146/84   Wt Readings from Last 3 Encounters:  10/12/23 120 lb 6.4 oz (54.6 kg)  09/12/23 119 lb 12.8 oz (54.3 kg)  09/01/23 122 lb (55.3 kg)      Physical Exam Vitals reviewed.  Constitutional:      General: She is not in acute distress.    Appearance: She is not ill-appearing.  Cardiovascular:     Rate and  Rhythm: Normal rate and regular rhythm.  Pulmonary:     Effort: Pulmonary effort is normal.     Breath sounds: Normal breath sounds. No wheezing or rales.  Musculoskeletal:     Right lower leg: No edema.     Left lower leg: No edema.  Neurological:     Mental Status: She is alert.      No results found for any visits on 10/12/23.  Last CBC Lab Results  Component Value Date   WBC 6.8 12/06/2022   HGB 13.8 12/06/2022   HCT 42.3 12/06/2022   MCV 92.4 12/06/2022   MCH 31.0 12/04/2021   RDW 12.9 12/06/2022   PLT 333.0 12/06/2022   Last metabolic panel Lab Results  Component Value Date   GLUCOSE 94 04/04/2023   NA 136 04/04/2023   K 3.6 04/04/2023   CL 98 04/04/2023   CO2 29 04/04/2023   BUN 19 04/04/2023   CREATININE 0.62 04/04/2023   GFR 87.28 04/04/2023   CALCIUM 10.0 04/04/2023   PROT 7.1 04/04/2023   ALBUMIN 4.6 04/04/2023   BILITOT 0.4 04/04/2023   ALKPHOS 68 04/04/2023   AST 20 04/04/2023   ALT 10 04/04/2023   ANIONGAP 8 12/04/2021      The 10-year ASCVD risk score (Arnett DK, et al., 2019) is: 20.4%    Assessment & Plan:   Problem List Items Addressed This Visit       Unprioritized   Essential hypertension, benign - Primary  Hypertension improved today with repeat reading at rest left arm seated with her machine 121/76 and with our manual reading 124/78.  This shows good consistency. - Continue Dash eating plan - Continue regular aerobic exercise with walking.  She will try to increase frequency - Continue monitoring blood pressure and be in touch if consistent systolic readings over 140. - Set up 59-month follow-up for ongoing assessment.  This will be about the time of her physical.  No follow-ups on file.    Wolm Scarlet, MD

## 2023-10-27 NOTE — Progress Notes (Unsigned)
 Ellouise Console, PA-C [address] [place], [place]  [address] Phone: [phone]   Primary Care Physician: Micheal Wolm ORN, MD  Primary Gastroenterologist:  Ellouise Console, PA-C / Norleen Kiang, MD   Chief Complaint: Follow-up GERD, dysphagia, bowel irregularities, rectal bleeding       HPI:   Heidi Shah is a 78 y.o. female returns for 68-month follow-up of GERD, dysphagia, constipation, diarrhea, and rectal bleeding.  She last saw Con Blower, PA  3 months ago.  Continued on omeprazole  40 Mg once daily and added famotidine  40 Mg nightly.  Started on MiraLAX and fiber supplement daily.  Prescribed hydrocortisone  suppositories to treat hemorrhoids.  Current symptoms:  Feeling a lot better.  GERD is better.  She takes omeprazole  40 Mg every morning.  She is not taking famotidine .  She does take OTC Tums as needed.  She has occasional mild breakthrough heartburn and throat clearing.  No recent dysphagia symptoms.  Hemorrhoids resolved.  Constipation is controlled.  No GI concerns today.  She was on Nexium  for many years.  She has history of osteopenia and osteoporosis.  She is reluctant to increase PPI dose.  History of GERD, peptic stricture, gastric ulcers, and multiple esophageal dilations presenting with dysphagia with solids and liquids and voice changes since her last endoscopy on 06/04/2022. States that previous dilations helped with her dysphagia; however, most recent dilation was non-therapeutic and has caused her to have a hoarse voice for many months.  06/2023 barium swallow with tablet: Mild intermittent dysmotility with spontaneous gastroesophageal reflux. Small sliding hiatal hernia. No evidence of esophageal stricture or other significant mucosal abnormality.  06/16/20 colonoscopy with diverticulosis in the left colon and otherwise normal, no repeat recommended    06/04/2022 EGD with Agapito dilation to [address] [place]. Findings of 1 benign-appearing esophageal  stenosis which was dilated.   Current Outpatient Medications  Medication Sig Dispense Refill   alendronate  (FOSAMAX ) 70 MG tablet Take 1 tablet (70 mg total) by mouth every 7 (seven) days. Take with a full glass of water on an empty stomach. 4 tablet 11   Biotin w/ Vitamins C & E (HAIR SKIN & NAILS GUMMIES PO) Take 2 each by mouth 4 (four) times a week.     Calcium Carb-Cholecalciferol (CALCIUM 600+D3 PO) Take 1 tablet by mouth daily.     cetirizine (ZYRTEC) 10 MG chewable tablet Chew 10 mg by mouth every morning.     Collagen-Vitamin C-Biotin (COLLAGEN PO) Take 1 Dose by mouth daily.     cyanocobalamin (VITAMIN B12) 1000 MCG tablet Take 1,000 mcg by mouth daily.     diltiazem  (CARDIZEM  CD) 180 MG 24 hr capsule Take 1 capsule (180 mg total) by mouth daily. 30 capsule 5   Ginkgo Biloba 60 MG CAPS Take 60 mg by mouth daily.     lisinopril -hydrochlorothiazide  (ZESTORETIC ) 20-12.5 MG tablet TAKE ONE TABLET BY MOUTH ONE TIME DAILY 90 tablet 3   Multiple Vitamin (MULTI-VITAMIN) tablet Take 1 tablet by mouth 4 (four) times a week.     omeprazole  (PRILOSEC) 40 MG capsule TAKE ONE CAPSULE BY MOUTH ONE TIME DAILY 90 capsule 1   vitamin C (ASCORBIC ACID) 500 MG tablet Take 500 mg by mouth 4 (four) times a week.     VYZULTA 0.024 % SOLN Place 1 drop into both eyes at bedtime.     No current facility-administered medications for this visit.    Allergies as of 10/28/2023 - Review Complete 10/28/2023  Allergen Reaction Noted   Nickel sulfate [nickel] Rash 04/17/2018   Beeswax  04/17/2018   Cetrimonium chloride [cetrimide]  04/17/2018   Lactose intolerance (gi) Diarrhea 12/13/2022   Methylisothiazolinone  04/17/2018   Neomycin sulfate [neomycin]  04/17/2018   Penicillins  11/23/2012   Propolis  04/17/2018    Past Medical History:  Diagnosis Date   Allergy     Anxiety    Arthritis    Borderline glaucoma    Cataract    surgery - Bilateral cataracts removed   Cervical spondylolysis    Chicken  pox    Colon polyps    Depression    Diverticulitis    Diverticulosis    Esophageal spasm    Esophageal stricture    Family history of adverse reaction to anesthesia    difficult time waking mother up   GERD (gastroesophageal reflux disease)    Heart murmur    Hypertension    Osteoporosis    Traumatic complete tear of left rotator cuff     Past Surgical History:  Procedure Laterality Date   cataract surgery Bilateral 1996   COLONOSCOPY  2022   EYE SURGERY  2000   detached L retina   HERNIA REPAIR Bilateral 1990   inguinal   NECK SURGERY     age 3-trimmed a muscle due to not being able to hold her head up   POLYPECTOMY  2022   REVERSE SHOULDER ARTHROPLASTY Left 12/16/2022   Procedure: LEFT REVERSE SHOULDER ARTHROPLASTY;  Surgeon: Genelle Standing, MD;  Location: [facility] OR[facility];  Service: Orthopedics;  Laterality: Left;   SHOULDER ARTHROSCOPY WITH ROTATOR CUFF REPAIR AND OPEN BICEPS TENODESIS Left 12/10/2021   Procedure: LEFT SHOULDER ARTHROSCOPY WITH ROTATOR CUFF REPAIR AND OPEN BICEPS TENODESIS;  Surgeon: Genelle Standing, MD;  Location: [facility];  Service: Orthopedics;  Laterality: Left;   UPPER GASTROINTESTINAL ENDOSCOPY  2022    Review of Systems:    All systems reviewed and negative except where noted in HPI.    Physical Exam:  BP 128/72 (BP Location: Left Arm, Patient Position: Sitting)   Pulse 93   Ht 4' 10 (1.473 m)   Wt 119 lb (54 kg)   BMI 24.87 kg/m  No LMP recorded. Patient is postmenopausal.  General: Well-nourished, well-developed in no acute distress.  Neuro: Alert and oriented x 3.  Grossly intact.  Psych: Alert and cooperative, normal mood and affect.   Imaging Studies: DG INJECT DIAG/THERA/INC NEEDLE/CATH/PLC EPI/LUMB/SAC W/IMG Result Date: 09/29/2023 CLINICAL DATA:  78 year old female with history of lumbar radiculopathy and left low back pain with moderate foraminal narrowing at L3-L4 on recent MRI. FLUOROSCOPY: Radiation Exposure Index (as provided by the  fluoroscopic device): 6.8 mGy reference air Kerma PROCEDURE: The procedure, risks, benefits, and alternatives were explained to the patient. Questions regarding the procedure were encouraged and answered. The patient understands and consents to the procedure. LUMBAR EPIDURAL INJECTION: An interlaminar approach was performed on left at L3-L4. The overlying skin was cleansed and anesthetized. A 3.5 inch 20 gauge epidural needle was advanced using loss-of-resistance technique. DIAGNOSTIC EPIDURAL INJECTION: Injection of Isovue -M 200 shows a good epidural pattern with spread above and below the level of needle placement, primarily on the left no vascular opacification is seen. THERAPEUTIC EPIDURAL INJECTION: Eighty mg of Depo-Medrol  mixed with 2 mL 1% lidocaine  were instilled. The procedure was well-tolerated, and the patient was discharged thirty minutes following the injection in good condition. COMPLICATIONS: None immediate IMPRESSION: Technically successful epidural injection on the left at L3-L4. Dylan  Jennefer, MD [facility] Electronically Signed   By: Ester Jennefer M.D.   On: 09/29/2023 09:28    Labs: CBC    Component Value Date/Time   WBC 6.8 12/06/2022 1458   RBC 4.58 12/06/2022 1458   HGB 13.8 12/06/2022 1458   HCT 42.3 12/06/2022 1458   PLT 333.0 12/06/2022 1458   MCV 92.4 12/06/2022 1458   MCH 31.0 12/04/2021 0845   MCHC 32.5 12/06/2022 1458   RDW 12.9 12/06/2022 1458   LYMPHSABS 2.7 12/06/2022 1458   MONOABS 0.7 12/06/2022 1458   EOSABS 0.2 12/06/2022 1458   BASOSABS 0.1 12/06/2022 1458    CMP     Component Value Date/Time   NA 136 04/04/2023 1152   K 3.6 04/04/2023 1152   CL 98 04/04/2023 1152   CO2 29 04/04/2023 1152   GLUCOSE 94 04/04/2023 1152   BUN 19 04/04/2023 1152   CREATININE 0.62 04/04/2023 1152   CREATININE 0.56 (L) 02/25/2020 0903   CALCIUM 10.0 04/04/2023 1152   PROT 7.1 04/04/2023 1152   ALBUMIN 4.6  04/04/2023 1152   AST 20 04/04/2023 1152   ALT 10 04/04/2023 1152   ALKPHOS 68 04/04/2023 1152   BILITOT 0.4 04/04/2023 1152   GFRNONAA >60 12/04/2021 0845   GFRAA  01/02/2010 0751    >60        The eGFR has been calculated using the MDRD equation. This calculation has not been validated in all clinical situations. eGFR's persistently <60 mL/min signify possible Chronic Kidney Disease.       Assessment and Plan:   NOLIE BIGNELL is a 78 y.o. y/o female returns for followup of:  GERD Mild Esophageal Dysmotility Small Hiatal Hernia  Plan: - Continue Omeprazole  40mg  1 tablet daily before breakfast. - Take OTC Pepcid  20mg  BID or TUMS as needed for breakthrough GERD symptoms. - Continue Lifestyle Modifications to prevent Acid Reflux.  Rec. Avoid coffee, sodas, peppermint, garlic, onions, alcohol, citrus fruits, chocolate, tomatoes, fatty and spicey foods.   - Avoid eating 2-3 hours before bedtime.   - We discussed adverse side effects of PPIs to include vitamin deficiencies, osteoporosis, renal insufficiency, dementia and increased risk of C. Difficile.  Recommend take lowest effective dose of PPI necessary to control acid reflux.  OK to add H2RB (Pepcid  20mg  daily) or antiacid if needed for breakthrough acid reflux.  - Chew Food Well.  Eat Slowly.  Eat Sitting upright.   Ellouise Console, PA-C  Follow up As Needed if recurrent GI symptoms.

## 2023-10-28 ENCOUNTER — Ambulatory Visit: Admitting: Physician Assistant

## 2023-10-28 ENCOUNTER — Encounter: Payer: Self-pay | Admitting: Physician Assistant

## 2023-10-28 VITALS — BP 128/72 | HR 93 | Ht <= 58 in | Wt 119.0 lb

## 2023-10-28 DIAGNOSIS — K449 Diaphragmatic hernia without obstruction or gangrene: Secondary | ICD-10-CM | POA: Diagnosis not present

## 2023-10-28 DIAGNOSIS — K219 Gastro-esophageal reflux disease without esophagitis: Secondary | ICD-10-CM | POA: Diagnosis not present

## 2023-10-28 DIAGNOSIS — K224 Dyskinesia of esophagus: Secondary | ICD-10-CM | POA: Diagnosis not present

## 2023-10-28 NOTE — Progress Notes (Signed)
 Noted

## 2023-10-28 NOTE — Patient Instructions (Signed)
 _______________________________________________________  If your blood pressure at your visit was 140/90 or greater, please contact your primary care physician to follow up on this. _______________________________________________________  If you are age 76 or older, your body mass index should be between 23-30. Your Body mass index is 24.87 kg/m. If this is out of the aforementioned range listed, please consider follow up with your Primary Care Provider. _______________________________________________________  The [facility] providers would like to encourage you to use MYCHART to communicate with providers for non-urgent requests or questions.  Due to long hold times on the telephone, sending your provider a message by [facility] may be a faster and more efficient way to get a response.  Please allow 48 business hours for a response.  Please remember that this is for non-urgent requests.  _______________________________________________________  [facility] is using a team-based approach to care.  Your team is made up of your doctor and two to three APPS. Our APPS (Nurse Practitioners and Physician Assistants) work with your physician to ensure care continuity for you. They are fully qualified to address your health concerns and develop a treatment plan. They communicate directly with your gastroenterologist to care for you. Seeing the Advanced Practice Practitioners on your physician's team can help you by facilitating care more promptly, often allowing for earlier appointments, access to diagnostic testing, procedures, and other specialty referrals.   Thank you for entrusting me with your care and choosing [facility].  Ellouise Console, PA-C

## 2023-11-05 ENCOUNTER — Other Ambulatory Visit: Payer: Self-pay | Admitting: Internal Medicine

## 2023-11-05 DIAGNOSIS — K259 Gastric ulcer, unspecified as acute or chronic, without hemorrhage or perforation: Secondary | ICD-10-CM

## 2023-11-08 NOTE — Progress Notes (Unsigned)
 [facility] [address] [place] [address] Phone: [phone] Subjective:   Heidi Shah, am serving as a scribe for Dr. Arthea Claudene.  I'm seeing this patient by the request  of:  Micheal Wolm ORN, MD  CC: Low back pain  YEP:Dlagzrupcz  09/01/2023 Significant of the lower back with radicular symptoms at this time now on the left leg.  Affecting daily activities that did not even affect getting dressed.  At this point I do think that advanced imaging with an MRI would be beneficial and patient could be a candidate for the possibility of an epidural.  Patient would like to be more active especially with the osteoporosis.  After the imaging we will discuss with patient and discuss the next treatment options.     Updated 11/16/2023 Heidi Shah is a 78 y.o. female coming in with complaint of back pain.  MRI of the lumbar spine showed multiple areas of potential nerve root impingement in the lumbar spine.  Epi on 09/29/2023.  This was at the L3-L4 level. Back has been doing well. L leg has been bothering her, worse since epidural. Pain sometimes radiates. No other symptoms.       Past Medical History:  Diagnosis Date   Allergy     Anxiety    Arthritis    Borderline glaucoma    Cataract    surgery - Bilateral cataracts removed   Cervical spondylolysis    Chicken pox    Colon polyps    Depression    Diverticulitis    Diverticulosis    Esophageal spasm    Esophageal stricture    Family history of adverse reaction to anesthesia    difficult time waking mother up   GERD (gastroesophageal reflux disease)    Heart murmur    Hypertension    Osteoporosis    Traumatic complete tear of left rotator cuff    Past Surgical History:  Procedure Laterality Date   cataract surgery Bilateral 1996   COLONOSCOPY  2022   EYE SURGERY  2000   detached L retina   HERNIA REPAIR Bilateral 1990   inguinal   NECK SURGERY     age 59-trimmed a muscle  due to not being able to hold her head up   POLYPECTOMY  2022   REVERSE SHOULDER ARTHROPLASTY Left 12/16/2022   Procedure: LEFT REVERSE SHOULDER ARTHROPLASTY;  Surgeon: Genelle Standing, MD;  Location: [facility];  Service: Orthopedics;  Laterality: Left;   SHOULDER ARTHROSCOPY WITH ROTATOR CUFF REPAIR AND OPEN BICEPS TENODESIS Left 12/10/2021   Procedure: LEFT SHOULDER ARTHROSCOPY WITH ROTATOR CUFF REPAIR AND OPEN BICEPS TENODESIS;  Surgeon: Genelle Standing, MD;  Location: [facility];  Service: Orthopedics;  Laterality: Left;   UPPER GASTROINTESTINAL ENDOSCOPY  2022   Social History   Socioeconomic History   Marital status: Divorced    Spouse name: Not on file   Number of children: 2   Years of education: master's degree   Highest education level: Master's degree (e.g., MA, MS, MEng, MEd, MSW, MBA)  Occupational History   Occupation: retired Runner, broadcasting/film/video  Tobacco Use   Smoking status: Never   Smokeless tobacco: Never  Vaping Use   Vaping status: Never Used  Substance and Sexual Activity   Alcohol use: Yes    Alcohol/week: 1.0 standard drink of alcohol    Types: 1 Glasses of wine per week    Comment: one or two glasses of wine a couple times a week  Drug use: No   Sexual activity: Not on file  Other Topics Concern   Not on file  Social History Narrative      Lives alone in ranch home.    2 children   Son lives in [place]    Continues to teach virtually part time   Social Drivers of Health   Financial Resource Strain: Low Risk  (10/08/2023)   Overall Financial Resource Strain (CARDIA)    Difficulty of Paying Living Expenses: Not hard at all  Food Insecurity: No Food Insecurity (10/08/2023)   Hunger Vital Sign    Worried About Running Out of Food in the Last Year: Never true    Ran Out of Food in the Last Year: Never true  Transportation Needs: No Transportation Needs (10/08/2023)   PRAPARE - Administrator, Civil Service (Medical): No    Lack of Transportation  (Non-Medical): No  Physical Activity: Sufficiently Active (10/08/2023)   Exercise Vital Sign    Days of Exercise per Week: 3 days    Minutes of Exercise per Session: 90 min  Stress: No Stress Concern Present (10/08/2023)   [employer] of Occupational Health - Occupational Stress Questionnaire    Feeling of Stress: Only a little  Social Connections: Moderately Integrated (10/08/2023)   Social Connection and Isolation Panel    Frequency of Communication with Friends and Family: More than three times a week    Frequency of Social Gatherings with Friends and Family: More than three times a week    Attends Religious Services: More than 4 times per year    Active Member of [employer] or Organizations: Yes    Attends Banker Meetings: 1 to 4 times per year    Marital Status: Divorced   Allergies  Allergen Reactions   Nickel Sulfate [Nickel] Rash    Per skin test from dermatologist   Beeswax     Per skin test from dermatologist   Cetrimonium Chloride [Cetrimide]     Per skin test from dermatologist   Lactose Intolerance (Gi) Diarrhea   Methylisothiazolinone     Per skin test from dermatologist   Neomycin Sulfate [Neomycin]     Per skin test from dermatologist   Penicillins     Childhood Reaction    Propolis     Per skin test from dermatologist   Family History  Problem Relation Age of Onset   Heart disease Mother 71   COPD Mother    Crohn's disease Mother    Heart disease Father 32   Cancer Father        kidney   Colon cancer Neg Hx    Esophageal cancer Neg Hx    Stomach cancer Neg Hx    Rectal cancer Neg Hx     Current Outpatient Medications (Endocrine & Metabolic):    alendronate  (FOSAMAX ) 70 MG tablet, Take 1 tablet (70 mg total) by mouth every 7 (seven) days. Take with a full glass of water on an empty stomach.  Current Outpatient Medications (Cardiovascular):    diltiazem  (CARDIZEM  CD) 180 MG 24 hr capsule, Take 1 capsule (180 mg total) by mouth daily.    lisinopril -hydrochlorothiazide  (ZESTORETIC ) 20-12.5 MG tablet, TAKE ONE TABLET BY MOUTH ONE TIME DAILY  Current Outpatient Medications (Respiratory):    cetirizine (ZYRTEC) 10 MG chewable tablet, Chew 10 mg by mouth every morning.   Current Outpatient Medications (Hematological):    cyanocobalamin (VITAMIN B12) 1000 MCG tablet, Take 1,000 mcg by mouth daily.  Current  Outpatient Medications (Other):    Biotin w/ Vitamins C & E (HAIR SKIN & NAILS GUMMIES PO), Take 2 each by mouth 4 (four) times a week.   Calcium Carb-Cholecalciferol (CALCIUM 600+D3 PO), Take 1 tablet by mouth daily.   Collagen-Vitamin C-Biotin (COLLAGEN PO), Take 1 Dose by mouth daily.   Ginkgo Biloba 60 MG CAPS, Take 60 mg by mouth daily.   Multiple Vitamin (MULTI-VITAMIN) tablet, Take 1 tablet by mouth 4 (four) times a week.   omeprazole  (PRILOSEC) 40 MG capsule, TAKE ONE CAPSULE BY MOUTH ONE TIME DAILY   vitamin C (ASCORBIC ACID) 500 MG tablet, Take 500 mg by mouth 4 (four) times a week.   VYZULTA 0.024 % SOLN, Place 1 drop into both eyes at bedtime.   Reviewed prior external information including notes and imaging from  primary care provider As well as notes that were available from care everywhere and other healthcare systems.  Past medical history, social, surgical and family history all reviewed in electronic medical record.  No pertanent information unless stated regarding to the chief complaint.   Review of Systems:  No headache, visual changes, nausea, vomiting, diarrhea, constipation, dizziness, abdominal pain, skin rash, fevers, chills, night sweats, weight loss, swollen lymph nodes, body aches, joint swelling, chest pain, shortness of breath, mood changes. POSITIVE muscle aches  Objective  Blood pressure 106/72, pulse 86, height 4' 10 (1.473 m), weight 120 lb (54.4 kg), SpO2 96%.   General: No apparent distress alert and oriented x3 mood and affect normal, dressed appropriately.  HEENT: Pupils equal,  extraocular movements intact  Respiratory: Patient's speak in full sentences and does not appear short of breath  Cardiovascular: No lower extremity edema, non tender, no erythema   Low back exam shows patient does have still some degenerative scoliosis noted.  Relatively some mild increase in kyphosis of the upper left side of the thoracic spine.  Foot exam does show some mild overpronation of the hindfoot noted.  Breakdown somewhat of the longitudinal arch and somewhat of the transverse arch.  Mild splaying noted between the 1st and 2nd toes.    Impression and Recommendations:     The above documentation has been reviewed and is accurate and complete Chantal Worthey M Pruitt Taboada, DO

## 2023-11-11 DIAGNOSIS — L821 Other seborrheic keratosis: Secondary | ICD-10-CM | POA: Diagnosis not present

## 2023-11-11 DIAGNOSIS — L309 Dermatitis, unspecified: Secondary | ICD-10-CM | POA: Diagnosis not present

## 2023-11-11 DIAGNOSIS — D225 Melanocytic nevi of trunk: Secondary | ICD-10-CM | POA: Diagnosis not present

## 2023-11-11 DIAGNOSIS — L814 Other melanin hyperpigmentation: Secondary | ICD-10-CM | POA: Diagnosis not present

## 2023-11-16 ENCOUNTER — Ambulatory Visit: Admitting: Family Medicine

## 2023-11-16 VITALS — BP 106/72 | HR 86 | Ht <= 58 in | Wt 120.0 lb

## 2023-11-16 DIAGNOSIS — M79671 Pain in right foot: Secondary | ICD-10-CM | POA: Diagnosis not present

## 2023-11-16 DIAGNOSIS — M4150 Other secondary scoliosis, site unspecified: Secondary | ICD-10-CM

## 2023-11-16 DIAGNOSIS — M79672 Pain in left foot: Secondary | ICD-10-CM

## 2023-11-16 DIAGNOSIS — M25552 Pain in left hip: Secondary | ICD-10-CM | POA: Diagnosis not present

## 2023-11-16 NOTE — Assessment & Plan Note (Signed)
 Degenerative scoliosis noted.  Patient is doing much better after the back injection.  Doing somewhat better with the very small eighth of an inch heel lift.  Some of the breakdown of the foot could be potentially contributing and will refer patient for custom orthotics.  Patient will follow-up with me again otherwise in 3 months time.

## 2023-11-16 NOTE — Patient Instructions (Signed)
 [place] [facility] Postural exercises 5 mins a day See you again when you need me

## 2023-11-16 NOTE — Assessment & Plan Note (Signed)
 Multifactorial but does have breakdown of the longitudinal and transverse arch minorly.  Could be contributing to some of the difficulty patient is having imaging but does have the underlying scoliosis as well.

## 2023-11-22 ENCOUNTER — Encounter: Admitting: Family Medicine

## 2023-11-22 ENCOUNTER — Encounter: Payer: Self-pay | Admitting: Family Medicine

## 2023-12-06 ENCOUNTER — Encounter: Admitting: Family Medicine

## 2023-12-15 ENCOUNTER — Encounter

## 2023-12-16 ENCOUNTER — Encounter: Admitting: Family Medicine

## 2024-01-08 ENCOUNTER — Emergency Department (HOSPITAL_COMMUNITY)

## 2024-01-08 ENCOUNTER — Encounter (HOSPITAL_COMMUNITY): Payer: Self-pay | Admitting: Emergency Medicine

## 2024-01-08 ENCOUNTER — Other Ambulatory Visit: Payer: Self-pay

## 2024-01-08 ENCOUNTER — Emergency Department (HOSPITAL_COMMUNITY)
Admission: EM | Admit: 2024-01-08 | Discharge: 2024-01-08 | Disposition: A | Discharge status: Home / Self Care | Attending: Emergency Medicine | Admitting: Emergency Medicine

## 2024-01-08 DIAGNOSIS — M542 Cervicalgia: Secondary | ICD-10-CM | POA: Insufficient documentation

## 2024-01-08 DIAGNOSIS — S299XXA Unspecified injury of thorax, initial encounter: Secondary | ICD-10-CM | POA: Diagnosis not present

## 2024-01-08 DIAGNOSIS — W19XXXA Unspecified fall, initial encounter: Secondary | ICD-10-CM | POA: Diagnosis not present

## 2024-01-08 DIAGNOSIS — S0990XA Unspecified injury of head, initial encounter: Secondary | ICD-10-CM | POA: Diagnosis not present

## 2024-01-08 DIAGNOSIS — W108XXA Fall (on) (from) other stairs and steps, initial encounter: Secondary | ICD-10-CM | POA: Insufficient documentation

## 2024-01-08 DIAGNOSIS — M546 Pain in thoracic spine: Secondary | ICD-10-CM | POA: Diagnosis not present

## 2024-01-08 DIAGNOSIS — M4319 Spondylolisthesis, multiple sites in spine: Secondary | ICD-10-CM | POA: Diagnosis not present

## 2024-01-08 DIAGNOSIS — M438X4 Other specified deforming dorsopathies, thoracic region: Secondary | ICD-10-CM | POA: Diagnosis not present

## 2024-01-08 DIAGNOSIS — H538 Other visual disturbances: Secondary | ICD-10-CM | POA: Diagnosis not present

## 2024-01-08 DIAGNOSIS — M47812 Spondylosis without myelopathy or radiculopathy, cervical region: Secondary | ICD-10-CM | POA: Diagnosis not present

## 2024-01-08 DIAGNOSIS — M7918 Myalgia, other site: Secondary | ICD-10-CM

## 2024-01-08 DIAGNOSIS — R42 Dizziness and giddiness: Secondary | ICD-10-CM | POA: Diagnosis not present

## 2024-01-08 DIAGNOSIS — M47814 Spondylosis without myelopathy or radiculopathy, thoracic region: Secondary | ICD-10-CM | POA: Diagnosis not present

## 2024-01-08 DIAGNOSIS — M25519 Pain in unspecified shoulder: Secondary | ICD-10-CM | POA: Diagnosis not present

## 2024-01-08 MED ORDER — LIDOCAINE 5 % EX PTCH: 1.0000 | MEDICATED_PATCH | CUTANEOUS | 0 refills | Status: AC

## 2024-01-08 MED ORDER — KETOROLAC TROMETHAMINE 15 MG/ML IJ SOLN
15.0000 mg | Freq: Once | INTRAMUSCULAR | Status: AC
  Administered 2024-01-08: 15 mg via INTRAMUSCULAR
  Filled 2024-01-08: qty 1

## 2024-01-08 NOTE — ED Provider Notes (Signed)
 [place] EMERGENCY DEPARTMENT AT [facility] Provider Note   CSN: [id number] Arrival date & time: 01/08/24  1524     Patient presents with: Heidi Shah Heidi Shah is a 78 y.o. female presents today after a mechanical fall in which she missed a step while walking down stairs, fell, and hit the back of her head against a wall.  Patient reports neck/thoracic spine pain.  Patient denies LOC, diplopia, tinnitus, headache, nausea, vomiting, chest pain, shortness of breath, numbness, weakness, saddle anesthesia, or loss of bowel/bladder control.  Patient denies blood thinner use.    Fall       Prior to Admission medications   Medication Sig Start Date End Date Taking? Authorizing Provider  lidocaine  (LIDODERM ) 5 % Place 1 patch onto the skin daily. Remove & Discard patch within 12 hours or as directed by MD 01/08/24  Yes Kiarra Kidd N, PA-C  alendronate  (FOSAMAX ) 70 MG tablet Take 1 tablet (70 mg total) by mouth every 7 (seven) days. Take with a full glass of water on an empty stomach. 04/08/23   Burchette, Wolm ORN, MD  Biotin w/ Vitamins C & E (HAIR SKIN & NAILS GUMMIES PO) Take 2 each by mouth 4 (four) times a week.    [provider]  Calcium Carb-Cholecalciferol (CALCIUM 600+D3 PO) Take 1 tablet by mouth daily.    [provider]  cetirizine (ZYRTEC) 10 MG chewable tablet Chew 10 mg by mouth every morning.    [provider]  Collagen-Vitamin C-Biotin (COLLAGEN PO) Take 1 Dose by mouth daily.    [provider]  cyanocobalamin (VITAMIN B12) 1000 MCG tablet Take 1,000 mcg by mouth daily.    [provider]  diltiazem  (CARDIZEM  CD) 180 MG 24 hr capsule Take 1 capsule (180 mg total) by mouth daily. 09/12/23   Burchette, Wolm ORN, MD  Ginkgo Biloba 60 MG CAPS Take 60 mg by mouth daily.    [provider]  lisinopril -hydrochlorothiazide  (ZESTORETIC ) 20-12.5 MG tablet TAKE ONE TABLET BY MOUTH ONE TIME DAILY 05/13/23    Burchette, Wolm ORN, MD  Multiple Vitamin (MULTI-VITAMIN) tablet Take 1 tablet by mouth 4 (four) times a week.    [provider]  omeprazole  (PRILOSEC) 40 MG capsule TAKE ONE CAPSULE BY MOUTH ONE TIME DAILY 11/07/23   Abran Norleen SAILOR, MD  vitamin C (ASCORBIC ACID) 500 MG tablet Take 500 mg by mouth 4 (four) times a week.    [provider]  VYZULTA 0.024 % SOLN Place 1 drop into both eyes at bedtime. 12/01/19   [provider]    Allergies: Nickel sulfate [nickel], Beeswax, Cetrimonium chloride [cetrimide], Lactose intolerance (gi), Methylisothiazolinone, Neomycin sulfate [neomycin], Penicillins, and Propolis    Review of Systems  Musculoskeletal:  Positive for back pain and neck pain.    Updated Vital Signs BP (!) 138/92   Pulse 80   Temp 98 F (36.7 C)   Resp 16   SpO2 100%   Physical Exam Vitals and nursing note reviewed.  Constitutional:      General: She is not in acute distress.    Appearance: She is well-developed.  HENT:     Head: Normocephalic and atraumatic. No raccoon eyes or Battle's sign.     Jaw: There is normal jaw occlusion.     Right Ear: External ear normal.     Left Ear: External ear normal.     Nose: Nose normal.  Eyes:     Conjunctiva/sclera: Conjunctivae  normal.  Neck:     Comments: Patient has tenderness to palpation of the bony and paraspinal muscles in the lower C-spine and upper T-spine without deformity, step-off, ecchymosis, or other obvious injury. Cardiovascular:     Rate and Rhythm: Normal rate and regular rhythm.     Pulses: Normal pulses.     Heart sounds: Normal heart sounds. No murmur heard. Pulmonary:     Effort: Pulmonary effort is normal. No respiratory distress.     Breath sounds: Normal breath sounds.  Abdominal:     Palpations: Abdomen is soft.     Tenderness: There is no abdominal tenderness.  Musculoskeletal:        General: No swelling.     Cervical back: Neck supple.  Skin:    General: Skin is warm  and dry.     Capillary Refill: Capillary refill takes less than 2 seconds.  Neurological:     General: No focal deficit present.     Mental Status: She is alert and oriented to person, place, and time.     Cranial Nerves: No cranial nerve deficit.     Sensory: No sensory deficit.     Motor: No weakness.     Gait: Gait normal.  Psychiatric:        Mood and Affect: Mood normal.     (all labs ordered are listed, but only abnormal results are displayed) Labs Reviewed - No data to display  EKG: None  Radiology: CT Thoracic Spine Wo Contrast Result Date: 01/08/2024 EXAM: CT THORACIC SPINE WITHOUT CONTRAST 01/08/2024 04:44:26 PM TECHNIQUE: CT of the thoracic spine was performed without the administration of intravenous contrast. Multiplanar reformatted images are provided for review. Automated exposure control, iterative reconstruction, and/or weight based adjustment of the mA/kV was utilized to reduce the radiation dose to as low as reasonably achievable. COMPARISON: None available. CLINICAL HISTORY: Back trauma, no prior imaging (Age >= 16y). Patient BIB EMS c/o fall. Patient reports missing a step while descending the stairs and striking the back of her head against the wall. She complains of neck and upper back pain, Patient denies LOC and is not on blood thinners. Patient ambulatory in triage. BP120/76. FINDINGS: BONES AND ALIGNMENT: Rightward curvature of the thoracic spine is centered at T8. Straightening of a normal thoracic kyphosis is present. Normal vertebral body heights. No acute fracture or suspicious bone lesion. DEGENERATIVE CHANGES: Degenerative changes are present along the concave aspect of the curvature. SOFT TISSUES: No acute abnormality. IMPRESSION: 1. No acute abnormality of the thoracic spine related to the reported trauma. 2. Rightward curvature of the thoracic spine centered at T8 with straightening of the normal thoracic kyphosis. Electronically signed by: Lonni Necessary MD 01/08/2024 05:20 PM EDT RP Workstation: [id number]   CT Cervical Spine Wo Contrast Result Date: 01/08/2024 EXAM: CT CERVICAL SPINE WITHOUT CONTRAST 01/08/2024 04:44:26 PM TECHNIQUE: CT of the cervical spine was performed without the administration of intravenous contrast. Multiplanar reformatted images are provided for review. Automated exposure control, iterative reconstruction, and/or weight based adjustment of the mA/kV was utilized to reduce the radiation dose to as low as reasonably achievable. COMPARISON: None available. CLINICAL HISTORY: Age >= 80y, female. Neck trauma due to fall, complaining of neck and upper back pain. No LOC, not on blood thinners. FINDINGS: CERVICAL SPINE: BONES AND ALIGNMENT: Straightening of the normal cervical lordosis is present. Grade 1 degenerative anterolisthesis is present at C2-C3 and C3-C4. Grade 1 anterolisthesis present at C7-T1. No acute fracture or traumatic malalignment. DEGENERATIVE  CHANGES: Chronic loss of disc height is present at C4-C5, C5-C6 and C6-C7. SOFT TISSUES: No prevertebral soft tissue swelling. IMPRESSION: 1. No acute traumatic injury identified in the cervical spine. 2. Straightening of the normal cervical lordosis. 3. Grade 1 degenerative anterolisthesis at C2-3 and C3-4, and grade 1 anterolisthesis at C7-T1. 4. Chronic loss of disc height at C4-5, C5-6, and C6-7. Electronically signed by: Lonni Necessary MD 01/08/2024 05:15 PM EDT RP Workstation: [id number]   CT Head Wo Contrast Result Date: 01/08/2024 EXAM: CT HEAD WITHOUT CONTRAST 01/08/2024 04:44:26 PM TECHNIQUE: CT of the head was performed without the administration of intravenous contrast. Automated exposure control, iterative reconstruction, and/or weight based adjustment of the mA/kV was utilized to reduce the radiation dose to as low as reasonably achievable. COMPARISON: MR head without contrast 05/25/2022. CLINICAL HISTORY: Head trauma, minor (Age >= 65y). Patient presented  via EMS complaining of a fall, reporting she missed a step while descending stairs and struck the back of her head against a wall. She complains of neck and upper back pain, denies loss of consciousness, and is not on blood thinners. FINDINGS: BRAIN AND VENTRICLES: No acute hemorrhage. No evidence of acute infarct. No hydrocephalus. No extra-axial collection. No mass effect or midline shift. Mild atrophy and white matter changes are within normal limits for age. ORBITS: Bilateral lens replacements are noted. The globes and orbits are otherwise within normal limits. Left-sided scleral banding is noted. SINUSES: No acute abnormality. SOFT TISSUES AND SKULL: No acute soft tissue abnormality. No skull fracture. IMPRESSION: 1. No acute intracranial abnormality related to the reported head trauma. Electronically signed by: Lonni Necessary MD 01/08/2024 05:11 PM EDT RP Workstation: [id number]     Procedures   Medications Ordered in the ED  ketorolac (TORADOL) 15 MG/ML injection 15 mg (has no administration in time range)                                    Medical Decision Making Amount and/or Complexity of Data Reviewed Radiology: ordered.   This patient presents to the ED for concern of fall differential diagnosis includes brain bleed, C-spine injury, T-spine injury, musculoskeletal pain, cauda equina syndrome, spinal cord injury   Imaging Studies ordered:  I ordered imaging studies including CT head, C-spine, T-spine Noncon I independently visualized and interpreted imaging which showed no acute traumatic injury identified in the C-spine, no acute intracranial abnormality related to the reported head trauma, no acute abnormality of the thoracic spine related to reported trauma I agree with the radiologist interpretation   Medicines ordered and prescription drug management:  I ordered medication including toradol    I have reviewed the patients home medicines and have made adjustments as  needed   Problem List / ED Course:  Considered for admission or further workup however patient's vital signs, physical exam, and imaging are reassuring.  Patient has no red flag signs or symptoms concerning for spinal cord injury or cauda equina syndrome.  Patient symptoms likely due to musculoskeletal pain.  Patient advised to alternate Tylenol /Motrin  as needed for pain.  Patient given short course of lidoderm  patches outpatient.  Patient may also ice affected area.  Patient given return precautions.  I feel patient safe for discharge at this time.      Final diagnoses:  Fall, initial encounter    ED Discharge Orders          Ordered    lidocaine  (  LIDODERM ) 5 %  Every 24 hours        01/08/24 1733               Francis Ileana SAILOR, PA-C 01/08/24 1734    Melvenia Motto, MD 01/09/24 [id number]

## 2024-01-08 NOTE — ED Triage Notes (Signed)
 Patient BIB EMS c/o fall. Patient reports missing a step while descending the stairs and striking the back of her head against the wall. She complains of neck and upper back pain , Patient denies LOC and is not on blood thinners. Patient ambulatory in triage.  BP120/76 HR 76 RR 16 O2sat 98% on RA CBG 98

## 2024-01-08 NOTE — Discharge Instructions (Addendum)
 Today you were seen for a fall.  Your workup while in the emergency department was reassuring.  Your symptoms are likely due to musculoskeletal pain.  You may alternate Tylenol /Motrin  as needed for pain.  You have also been prescribed Lidoderm  patches to use as needed.  Please follow-up with your PCP if your symptoms persist for further evaluation workup.  Please return to the ED if you have numbness, weakness, or loss of bowel or bladder control.  Thank you for letting us  treat you today. After reviewing your imaging, I feel you are safe to go home. Please follow up with your PCP in the next several days and provide them with your records from this visit. Return to the Emergency Room if pain becomes severe or symptoms worsen.

## 2024-01-16 ENCOUNTER — Other Ambulatory Visit: Payer: Self-pay

## 2024-01-16 ENCOUNTER — Telehealth: Payer: Self-pay | Admitting: Family Medicine

## 2024-01-16 DIAGNOSIS — M5416 Radiculopathy, lumbar region: Secondary | ICD-10-CM

## 2024-01-16 NOTE — Telephone Encounter (Signed)
 Pt calling to repeat epidural. Last epidural 10/26/2023 Last OV with Claudene 11/16/2023  FYI, pt mentioned a fall 1 week ago that has affected her back. Location of discomfort is the same, just more constant since the fall.  Unsure if this impacts her having another epidural or if she needs and OV first.

## 2024-01-19 DIAGNOSIS — R350 Frequency of micturition: Secondary | ICD-10-CM | POA: Diagnosis not present

## 2024-01-19 DIAGNOSIS — R399 Unspecified symptoms and signs involving the genitourinary system: Secondary | ICD-10-CM | POA: Diagnosis not present

## 2024-01-24 NOTE — Discharge Instructions (Signed)

## 2024-01-25 ENCOUNTER — Ambulatory Visit
Admission: RE | Admit: 2024-01-25 | Discharge: 2024-01-25 | Disposition: A | Discharge status: Home / Self Care | Source: Ambulatory Visit | Attending: Family Medicine | Admitting: Family Medicine

## 2024-01-25 ENCOUNTER — Other Ambulatory Visit

## 2024-01-25 DIAGNOSIS — M5416 Radiculopathy, lumbar region: Secondary | ICD-10-CM

## 2024-01-25 MED ORDER — IOPAMIDOL (ISOVUE-M 200) INJECTION 41%
1.0000 mL | Freq: Once | INTRAMUSCULAR | Status: AC
  Administered 2024-01-25: 1 mL via EPIDURAL

## 2024-01-25 MED ORDER — METHYLPREDNISOLONE ACETATE 40 MG/ML INJ SUSP (RADIOLOG
80.0000 mg | Freq: Once | INTRAMUSCULAR | Status: AC
  Administered 2024-01-25: 80 mg via EPIDURAL

## 2024-01-30 ENCOUNTER — Encounter: Payer: Self-pay | Admitting: Radiology

## 2024-02-09 ENCOUNTER — Other Ambulatory Visit: Payer: Self-pay | Admitting: Family Medicine

## 2024-03-01 ENCOUNTER — Ambulatory Visit: Admitting: Physician Assistant

## 2024-03-07 NOTE — Progress Notes (Signed)
 Ellouise Console, PA-C [address] [place], [place]  [address] Phone: [phone]   Primary Care Physician: Micheal Wolm ORN, MD  Primary Gastroenterologist:  Ellouise Console, PA-C / Norleen Kiang, MD   Chief Complaint: Rectal bleeding   HPI:   Discussed the use of AI scribe software for clinical note transcription with the patient, who gave verbal consent to proceed.  History of Present Illness Heidi Shah is a 78 year old female, established patient of Dr. Kiang, with GERD, dysphagia, and bowel irregularities who presents for evaluation of rectal bleeding and bowel issues.  Rectal bleeding - Intermittent rectal bleeding began in October and persisted for about one month - Bleeding is associated with bowel movements and may be related to straining - Bleeding ceases for several months before resuming - Last colonoscopy in March 2022 showed diverticulosis, no colon polyps - She has not had any more rectal bleeding since end of October.  Bowel irregularities - Irregular bowel movements with frequent episodes of leaky bowel and loose strands - She has also had constipation with hard stools with straining. - Has a feeling of incomplete bowel evacuation.  Small thin stools. - History of irritable bowel syndrome and bowel irregularities for many years. - Only three solid bowel movements in the past three months - Occasional pain primarily due to gas and bloating - No significant abdominal pain - No current medication for bowel issues  Dysphagia and throat discomfort - Occasional difficulty swallowing, particularly with liquids, not constant - Persistent throat discomfort and frequent throat clearing  Gastroesophageal reflux symptoms - GERD controlled with omeprazole  40 mg daily - No current symptoms of acid reflux  Relevant gastrointestinal history - History of peptic stricture, gastric ulcers, and multiple esophageal dilations   06/2023 barium swallow with  tablet: Mild intermittent dysmotility with spontaneous gastroesophageal reflux. Small sliding hiatal hernia. No evidence of esophageal stricture or other significant mucosal abnormality.   06/16/20 colonoscopy by Dr. Kiang with diverticulosis in the left colon and otherwise normal, no repeat recommended    06/04/2022 EGD by Dr. Kiang with Agapito dilation to 32 French. Findings of 1 benign-appearing esophageal stenosis which was dilated.   Current Outpatient Medications  Medication Sig Dispense Refill   alendronate  (FOSAMAX ) 70 MG tablet Take 1 tablet (70 mg total) by mouth every 7 (seven) days. Take with a full glass of water on an empty stomach. 4 tablet 11   Biotin w/ Vitamins C & E (HAIR SKIN & NAILS GUMMIES PO) Take 2 each by mouth 4 (four) times a week.     Calcium Carb-Cholecalciferol (CALCIUM 600+D3 PO) Take 1 tablet by mouth daily.     cetirizine (ZYRTEC) 10 MG chewable tablet Chew 10 mg by mouth every morning.     Collagen-Vitamin C-Biotin (COLLAGEN PO) Take 1 Dose by mouth daily.     cyanocobalamin (VITAMIN B12) 1000 MCG tablet Take 1,000 mcg by mouth daily.     diltiazem  (CARDIZEM  CD) 180 MG 24 hr capsule Take 1 capsule (180 mg total) by mouth daily. 30 capsule 5   famotidine  (PEPCID ) 40 MG tablet Take 1 tablet (40 mg total) by mouth at bedtime. 90 tablet 3   Ginkgo Biloba 60 MG CAPS Take 60 mg by mouth daily.     lidocaine  (LIDODERM ) 5 % Place 1 patch onto the skin daily. Remove & Discard patch within 12 hours or as directed by MD 30 patch 0   lisinopril -hydrochlorothiazide  (ZESTORETIC ) 20-12.5 MG tablet TAKE ONE  TABLET BY MOUTH ONE TIME DAILY 90 tablet 0   Multiple Vitamin (MULTI-VITAMIN) tablet Take 1 tablet by mouth 4 (four) times a week.     Na Sulfate-K Sulfate-Mg Sulfate concentrate (SUPREP) 17.5-3.13-1.6 GM/177ML SOLN Take 1 kit (354 mLs total) by mouth once for 1 dose. 354 mL 0   pantoprazole (PROTONIX) 40 MG tablet Take 1 tablet (40 mg total) by mouth daily before breakfast.  90 tablet 3   vitamin C (ASCORBIC ACID) 500 MG tablet Take 500 mg by mouth 4 (four) times a week.     VYZULTA 0.024 % SOLN Place 1 drop into both eyes at bedtime.     No current facility-administered medications for this visit.    Allergies as of 03/08/2024 - Review Complete 03/08/2024  Allergen Reaction Noted   Nickel sulfate [nickel] Rash 04/17/2018   Beeswax  04/17/2018   Cetrimonium chloride [cetrimide]  04/17/2018   Lactose intolerance (gi) Diarrhea 12/13/2022   Methylisothiazolinone  04/17/2018   Neomycin sulfate [neomycin]  04/17/2018   Penicillins  11/23/2012   Propolis  04/17/2018    Past Medical History:  Diagnosis Date   Allergy     Anxiety    Arthritis    Borderline glaucoma    Cataract    surgery - Bilateral cataracts removed   Cervical spondylolysis    Chicken pox    Colon polyps    Depression    Diverticulitis    Diverticulosis    Esophageal spasm    Esophageal stricture    Family history of adverse reaction to anesthesia    difficult time waking mother up   GERD (gastroesophageal reflux disease)    Heart murmur    Hypertension    Osteoporosis    Traumatic complete tear of left rotator cuff     Past Surgical History:  Procedure Laterality Date   cataract surgery Bilateral 1996   COLONOSCOPY  2022   EYE SURGERY  2000   detached L retina   HERNIA REPAIR Bilateral 1990   inguinal   NECK SURGERY     age 53-trimmed a muscle due to not being able to hold her head up   POLYPECTOMY  2022   REVERSE SHOULDER ARTHROPLASTY Left 12/16/2022   Procedure: LEFT REVERSE SHOULDER ARTHROPLASTY;  Surgeon: Genelle Standing, MD;  Location: [facility];  Service: Orthopedics;  Laterality: Left;   SHOULDER ARTHROSCOPY WITH ROTATOR CUFF REPAIR AND OPEN BICEPS TENODESIS Left 12/10/2021   Procedure: LEFT SHOULDER ARTHROSCOPY WITH ROTATOR CUFF REPAIR AND OPEN BICEPS TENODESIS;  Surgeon: Genelle Standing, MD;  Location: [facility];  Service: Orthopedics;  Laterality: Left;   UPPER  GASTROINTESTINAL ENDOSCOPY  2022    Review of Systems:    All systems reviewed and negative except where noted in HPI.    Physical Exam:  BP 124/82   Pulse 86   Ht 4' 10 (1.473 m)   Wt 119 lb 4 oz (54.1 kg)   BMI 24.92 kg/m  No LMP recorded. Patient is postmenopausal.  General: Well-nourished, well-developed in no acute distress.  Lungs: Clear to auscultation bilaterally. Non-labored. Heart: Regular rate and rhythm, no murmurs rubs or gallops.  Abdomen: Bowel sounds are normal; Abdomen is Soft; No hepatosplenomegaly, masses or hernias;  No Abdominal Tenderness; No guarding or rebound tenderness. Neuro: Alert and oriented x 3.  Grossly intact.  Psych: Alert and cooperative, normal mood and affect.   Imaging Studies: No results found.  Labs: CBC    Component Value Date/Time   WBC 5.0 03/08/2024 1120  RBC 4.25 03/08/2024 1120   HGB 13.3 03/08/2024 1120   HCT 39.3 03/08/2024 1120   PLT 295.0 03/08/2024 1120   MCV 92.3 03/08/2024 1120   MCH 31.0 12/04/2021 0845   MCHC 33.7 03/08/2024 1120   RDW 13.5 03/08/2024 1120   LYMPHSABS 1.8 03/08/2024 1120   MONOABS 0.6 03/08/2024 1120   EOSABS 0.1 03/08/2024 1120   BASOSABS 0.1 03/08/2024 1120    CMP     Component Value Date/Time   NA 136 04/04/2023 1152   K 3.6 04/04/2023 1152   CL 98 04/04/2023 1152   CO2 29 04/04/2023 1152   GLUCOSE 94 04/04/2023 1152   BUN 19 04/04/2023 1152   CREATININE 0.62 04/04/2023 1152   CREATININE 0.56 (L) 02/25/2020 0903   CALCIUM 10.0 04/04/2023 1152   PROT 7.1 04/04/2023 1152   ALBUMIN 4.6 04/04/2023 1152   AST 20 04/04/2023 1152   ALT 10 04/04/2023 1152   ALKPHOS 68 04/04/2023 1152   BILITOT 0.4 04/04/2023 1152   GFRNONAA >60 12/04/2021 0845   GFRAA  01/02/2010 0751    >60        The eGFR has been calculated using the MDRD equation. This calculation has not been validated in all clinical situations. eGFR's persistently <60 mL/min signify possible Chronic Kidney Disease.      Assessment and Plan:   Heidi Shah is a 77 y.o. y/o female presents for: Assessment & Plan 1.  Irritable bowel syndrome with constipation Intermittent constipation and loose stools with occasional rectal bleeding likely due to hemorrhoids. Previous colonoscopy showed diverticulosis without polyps. Symptoms suggestive of IBS-C. - Recommended daily fiber supplement (Metamucil). - Advised use of Miralax. - Scheduled repeat colonoscopy. - Ordered blood work for hemoglobin levels.  2.  Rectal bleeding Intermittent rectal bleeding likely due to hemorrhoids. Previous colonoscopy showed diverticulosis without polyps. - Scheduled repeat colonoscopy. - Ordered blood work (CBC) to screen for anemia. - Scheduling Colonoscopy I discussed risks of colonoscopy with patient to include risk of bleeding, colon perforation, and risk of sedation.  Patient expressed understanding and agrees to proceed with colonoscopy.   3.  Gastroesophageal reflux disease Chronic GERD with throat discomfort and occasional dysphagia. Current omeprazole  treatment ineffective. - Discontinued omeprazole . - Started pantoprazole once daily before breakfast. - Added famotidine  40 mg before bedtime. - GERD diet.  4.  Esophageal dysmotility with intermittent dysphagia Intermittent dysphagia, particularly with liquids. Previous barium swallow test showed no stricture. - Reassurance. -  Recommend eat small frequent meals.  Eat sitting up.  Eat slowly.  Ellouise Console, PA-C  Follow up after colonoscopy with [facility].

## 2024-03-08 ENCOUNTER — Ambulatory Visit: Admitting: Physician Assistant

## 2024-03-08 ENCOUNTER — Other Ambulatory Visit: Payer: Self-pay | Admitting: Family Medicine

## 2024-03-08 ENCOUNTER — Encounter: Payer: Self-pay | Admitting: Physician Assistant

## 2024-03-08 ENCOUNTER — Other Ambulatory Visit (INDEPENDENT_AMBULATORY_CARE_PROVIDER_SITE_OTHER)

## 2024-03-08 ENCOUNTER — Telehealth: Payer: Self-pay

## 2024-03-08 ENCOUNTER — Ambulatory Visit: Payer: Self-pay | Admitting: Physician Assistant

## 2024-03-08 VITALS — BP 124/82 | HR 86 | Ht <= 58 in | Wt 119.2 lb

## 2024-03-08 DIAGNOSIS — K625 Hemorrhage of anus and rectum: Secondary | ICD-10-CM

## 2024-03-08 DIAGNOSIS — K5904 Chronic idiopathic constipation: Secondary | ICD-10-CM

## 2024-03-08 DIAGNOSIS — K581 Irritable bowel syndrome with constipation: Secondary | ICD-10-CM

## 2024-03-08 DIAGNOSIS — R1310 Dysphagia, unspecified: Secondary | ICD-10-CM

## 2024-03-08 DIAGNOSIS — K219 Gastro-esophageal reflux disease without esophagitis: Secondary | ICD-10-CM

## 2024-03-08 DIAGNOSIS — K224 Dyskinesia of esophagus: Secondary | ICD-10-CM

## 2024-03-08 LAB — CBC WITH DIFFERENTIAL/PLATELET
Basophils Absolute: 0.1 K/uL (ref 0.0–0.1)
Basophils Relative: 1 % (ref 0.0–3.0)
Eosinophils Absolute: 0.1 K/uL (ref 0.0–0.7)
Eosinophils Relative: 2.1 % (ref 0.0–5.0)
HCT: 39.3 % (ref 36.0–46.0)
Hemoglobin: 13.3 g/dL (ref 12.0–15.0)
Lymphocytes Relative: 35.8 % (ref 12.0–46.0)
Lymphs Abs: 1.8 K/uL (ref 0.7–4.0)
MCHC: 33.7 g/dL (ref 30.0–36.0)
MCV: 92.3 fl (ref 78.0–100.0)
Monocytes Absolute: 0.6 K/uL (ref 0.1–1.0)
Monocytes Relative: 11.7 % (ref 3.0–12.0)
Neutro Abs: 2.5 K/uL (ref 1.4–7.7)
Neutrophils Relative %: 49.4 % (ref 43.0–77.0)
Platelets: 295 K/uL (ref 150.0–400.0)
RBC: 4.25 Mil/uL (ref 3.87–5.11)
RDW: 13.5 % (ref 11.5–15.5)
WBC: 5 K/uL (ref 4.0–10.5)

## 2024-03-08 MED ORDER — PANTOPRAZOLE SODIUM 40 MG PO TBEC: 40.0000 mg | DELAYED_RELEASE_TABLET | Freq: Every day | ORAL | 3 refills | Status: AC

## 2024-03-08 MED ORDER — FAMOTIDINE 40 MG PO TABS: 40.0000 mg | ORAL_TABLET | Freq: Every day | ORAL | 3 refills | Status: AC

## 2024-03-08 MED ORDER — NA SULFATE-K SULFATE-MG SULF 17.5-3.13-1.6 GM/177ML PO SOLN: 1.0000 | Freq: Once | ORAL | 0 refills | Status: AC

## 2024-03-08 NOTE — Telephone Encounter (Unsigned)
 Copied from CRM #[id number]. Topic: Clinical - Medication Refill >> Mar 08, 2024  2:04 PM Alfonso ORN wrote: Medication: diltiazem  (CARDIZEM  CD) 180 MG 24 hr capsule  Has the patient contacted their pharmacy? Yes   This is the patient's preferred pharmacy:  [id number] [place] - [place], [place] - [id number] [place]. AT [facility] [address] [place]. [place] [address] Phone: [phone] Fax: [phone]  Is this the correct pharmacy for this prescription? Yes If no, delete pharmacy and type the correct one.   Has the prescription been filled recently? Yes, 90 day fill   Is the patient out of the medication? Yes  Has the patient been seen for an appointment in the last year OR does the patient have an upcoming appointment? Yes  Can we respond through MyChart? Yes

## 2024-03-08 NOTE — Patient Instructions (Addendum)
 Your provider has requested that you go to the basement level for lab work before leaving today. Press B on the elevator. The lab is located at the first door on the left as you exit the elevator.  STOP Omeprazole   START Pantoprazole 40 mg once daily in the morning START Famotidine  40 mg once daily at bedtime   Start Metamucil (Psyllium Husk) Fiber Powder: Metamucil powders: Put 1-2 rounded spoons in an empty glass. If you're taking Metamucil Sugar-Free Powder or Premium Blend, use a teaspoon. If you're taking Metamucil with Real Sugar, use a tablespoon. Mix briskly with 8 oz. or more of cool liquid. Drink promptly, and enjoy! Drink 64 ounces of water or other liquids daily.     For constipation: Start OTC Miralax Powder Mix 1 capful in 6 to 8 ounces of a drink once daily  Recommend high-fiber diet, 30 g of fiber daily Eat fruits, vegetables, and whole grains Drink 64 ounces of water / fluids daily.   You have been scheduled for a Colonoscopy. Please follow written instructions given to you at your visit today.   If you use inhalers (even only as needed), please bring them with you on the day of your procedure.  DO NOT TAKE 7 DAYS PRIOR TO TEST- Trulicity (dulaglutide) Ozempic, Wegovy (semaglutide) Mounjaro (tirzepatide) Bydureon Bcise (exanatide extended release)  DO NOT TAKE 1 DAY PRIOR TO YOUR TEST Rybelsus (semaglutide) Adlyxin (lixisenatide) Victoza (liraglutide) Byetta (exanatide) ___________________________________________________________________________  Please follow up sooner if symptoms increase or worsen   Due to recent changes in healthcare laws, you may see the results of your imaging and laboratory studies on MyChart before your provider has had a chance to review them.  We understand that in some cases there may be results that are confusing or concerning to you. Not all laboratory results come back in the same time frame and the provider may be waiting for  multiple results in order to interpret others.  Please give us  48 hours in order for your provider to thoroughly review all the results before contacting the office for clarification of your results.   Thank you for trusting me with your gastrointestinal care!   Ellouise Console, PA-C _______________________________________________________  If your blood pressure at your visit was 140/90 or greater, please contact your primary care physician to follow up on this.  _______________________________________________________  If you are age 76 or older, your body mass index should be between 23-30. Your Body mass index is 24.92 kg/m. If this is out of the aforementioned range listed, please consider follow up with your Primary Care Provider.  If you are age 10 or younger, your body mass index should be between 19-25. Your Body mass index is 24.92 kg/m. If this is out of the aformentioned range listed, please consider follow up with your Primary Care Provider.   ________________________________________________________  The [facility] providers would like to encourage you to use MYCHART to communicate with providers for non-urgent requests or questions.  Due to long hold times on the telephone, sending your provider a message by [facility] may be a faster and more efficient way to get a response.  Please allow 48 business hours for a response.  Please remember that this is for non-urgent requests.  _______________________________________________________

## 2024-03-08 NOTE — Progress Notes (Signed)
 Noted

## 2024-03-08 NOTE — Telephone Encounter (Signed)
 Copied from CRM #[id number]. Topic: Clinical - Medication Question >> Mar 08, 2024  2:01 PM Alfonso ORN wrote: Reason for CRM: pt had a question on diltiazem  (CARDIZEM  CD) 180 MG 24 hr capsule pharmacy ordered amlodipine  2.5 in place of it and she is wanting to know why the medication was changed. Please contact pt to advise.

## 2024-03-09 ENCOUNTER — Telehealth: Payer: Self-pay

## 2024-03-09 NOTE — Telephone Encounter (Signed)
 Left message for the patient to return my call.

## 2024-03-09 NOTE — Telephone Encounter (Signed)
 Please see most recent encounter

## 2024-03-09 NOTE — Telephone Encounter (Signed)
 Copied from CRM #[id number]. Topic: Clinical - Medication Question >> Mar 09, 2024  2:42 PM Aisha D wrote: Reason for CRM: Pt is returning a missed call from Bubber Rothert. I informed the pt of the message from Dr.Burchette regarding the medication and informed the pt to contact the pharmacy.

## 2024-03-11 ENCOUNTER — Other Ambulatory Visit: Payer: Self-pay

## 2024-03-11 ENCOUNTER — Ambulatory Visit
Admission: EM | Admit: 2024-03-11 | Discharge: 2024-03-11 | Disposition: A | Discharge status: Home / Self Care | Attending: Physician Assistant | Admitting: Physician Assistant

## 2024-03-11 DIAGNOSIS — R42 Dizziness and giddiness: Secondary | ICD-10-CM

## 2024-03-11 MED ORDER — MECLIZINE HCL 25 MG PO TABS: 25.0000 mg | ORAL_TABLET | Freq: Three times a day (TID) | ORAL | 0 refills | Status: AC | PRN

## 2024-03-11 NOTE — ED Provider Notes (Signed)
 [facility]    CSN: [id number] Arrival date & time: 03/11/24  9043      History   Chief Complaint Chief Complaint  Patient presents with   Dizziness    HPI CLARABELL MATSUOKA is a 78 y.o. female.  has a past medical history of Allergy , Anxiety, Arthritis, Borderline glaucoma, Cataract, Cervical spondylolysis, Chicken pox, Colon polyps, Depression, Diverticulitis, Diverticulosis, Esophageal spasm, Esophageal stricture, Family history of adverse reaction to anesthesia, GERD (gastroesophageal reflux disease), Heart murmur, Hypertension, Osteoporosis, and Traumatic complete tear of left rotator cuff.   HPI  Discussed the use of AI scribe software for clinical note transcription with the patient, who gave verbal consent to proceed.  The patient presents with dizziness and tingling in the right hand.  She woke up with dizziness this morning, which worsens with head movement and makes her feel unsteady on her feet. The dizziness is persistent throughout the day and is particularly exacerbated by bending over and standing up. She recalls a similar episode approximately fourteen years ago, which was more severe and included nausea. No headaches, confusion, nausea, vomiting, diarrhea, fever, or chills.  For the past three days, she has experienced intermittent tingling in her right hand. The sensation comes and goes without a clear pattern.  She mentions experiencing some ear pain and a sensation of water in her ear after showering, although the pain is not constant.  She has not taken any medications to alleviate her symptoms. She recently had blood work done on Thursday, which was reported as normal and she declines repeat CMP and CBC today given these results.   Past Medical History:  Diagnosis Date   Allergy     Anxiety    Arthritis    Borderline glaucoma    Cataract    surgery - Bilateral cataracts removed   Cervical spondylolysis    Chicken pox    Colon polyps     Depression    Diverticulitis    Diverticulosis    Esophageal spasm    Esophageal stricture    Family history of adverse reaction to anesthesia    difficult time waking mother up   GERD (gastroesophageal reflux disease)    Heart murmur    Hypertension    Osteoporosis    Traumatic complete tear of left rotator cuff     Patient Active Problem List   Diagnosis Date Noted   Bilateral foot pain 11/16/2023   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 09/01/2023   Traumatic complete tear of left rotator cuff    Cervical spondylosis with myelopathy and radiculopathy 03/14/2020   Neck pain 03/14/2020   Aortic heart murmur 02/25/2020   Age-related osteoporosis without current pathological fracture 04/04/2019   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   Constipation 03/05/2013   GERD (gastroesophageal reflux disease) 01/19/2013   Esophageal spasm 01/19/2013   Dysphagia 01/19/2013   Diverticulitis of colon (without mention of hemorrhage)(562.11) 11/23/2012   History of depression 11/23/2012   Essential hypertension, benign 11/23/2012    Past Surgical History:  Procedure Laterality Date   cataract surgery Bilateral 1996   COLONOSCOPY  2022   EYE SURGERY  2000   detached L retina   HERNIA REPAIR Bilateral 1990   inguinal   NECK SURGERY     age 40-trimmed a muscle due to not being able to hold her head up   POLYPECTOMY  2022   REVERSE SHOULDER ARTHROPLASTY Left 12/16/2022   Procedure: LEFT REVERSE SHOULDER ARTHROPLASTY;  Surgeon: Genelle Standing, MD;  Location: [facility];  Service: Orthopedics;  Laterality: Left;   SHOULDER ARTHROSCOPY WITH ROTATOR CUFF REPAIR AND OPEN BICEPS TENODESIS Left 12/10/2021   Procedure: LEFT SHOULDER ARTHROSCOPY WITH ROTATOR CUFF REPAIR AND OPEN BICEPS TENODESIS;  Surgeon: Genelle Standing, MD;  Location: [facility] OR;  Service: Orthopedics;  Laterality: Left;   UPPER GASTROINTESTINAL ENDOSCOPY  2022    OB History   No obstetric history on file.      Home Medications     Prior to Admission medications  Medication Sig Start Date End Date Taking? Authorizing Provider  meclizine  (ANTIVERT ) 25 MG tablet Take 1 tablet (25 mg total) by mouth 3 (three) times daily as needed for dizziness. 03/11/24  Yes Tyson Parkison E, PA-C  alendronate  (FOSAMAX ) 70 MG tablet Take 1 tablet (70 mg total) by mouth every 7 (seven) days. Take with a full glass of water on an empty stomach. 04/08/23   Burchette, Wolm ORN, MD  Biotin w/ Vitamins C & E (HAIR SKIN & NAILS GUMMIES PO) Take 2 each by mouth 4 (four) times a week.    [provider]  Calcium Carb-Cholecalciferol (CALCIUM 600+D3 PO) Take 1 tablet by mouth daily.    [provider]  cetirizine (ZYRTEC) 10 MG chewable tablet Chew 10 mg by mouth every morning.    [provider]  Collagen-Vitamin C-Biotin (COLLAGEN PO) Take 1 Dose by mouth daily.    [provider]  cyanocobalamin (VITAMIN B12) 1000 MCG tablet Take 1,000 mcg by mouth daily.    [provider]  diltiazem  (CARDIZEM  CD) 180 MG 24 hr capsule TAKE ONE CAPSULE BY MOUTH ONE TIME DAILY 03/09/24   Burchette, Wolm ORN, MD  famotidine  (PEPCID ) 40 MG tablet Take 1 tablet (40 mg total) by mouth at bedtime. 03/08/24 03/03/25  [place], PA-C  Ginkgo Biloba 60 MG CAPS Take 60 mg by mouth daily.    [provider]  lidocaine  (LIDODERM ) 5 % Place 1 patch onto the skin daily. Remove & Discard patch within 12 hours or as directed by MD 01/08/24   Keith, Kayla N, PA-C  lisinopril -hydrochlorothiazide  (ZESTORETIC ) 20-12.5 MG tablet TAKE ONE TABLET BY MOUTH ONE TIME DAILY 02/10/24   Burchette, Wolm ORN, MD  Multiple Vitamin (MULTI-VITAMIN) tablet Take 1 tablet by mouth 4 (four) times a week.    [provider]  pantoprazole  (PROTONIX ) 40 MG tablet Take 1 tablet (40 mg total) by mouth daily before breakfast. 03/08/24   [place], PA-C  vitamin C (ASCORBIC ACID) 500 MG tablet Take 500 mg by mouth 4 (four) times a week.     [provider]  VYZULTA 0.024 % SOLN Place 1 drop into both eyes at bedtime. 12/01/19   [provider]    Family History Family History  Problem Relation Age of Onset   Heart disease Mother 61   COPD Mother    Crohn's disease Mother    Heart disease Father 53   Cancer Father        kidney   Colon cancer Neg Hx    Esophageal cancer Neg Hx    Stomach cancer Neg Hx    Rectal cancer Neg Hx     Social History Social History[1]   Allergies   Nickel sulfate [nickel], Beeswax, Cetrimonium chloride [cetrimide], Lactose intolerance (gi), Methylisothiazolinone, Neomycin sulfate [neomycin], Penicillins, and Propolis   Review of Systems Review of Systems  Constitutional:  Negative for chills and fever.  HENT:  Positive for ear pain.   Gastrointestinal:  Negative for diarrhea, nausea  and vomiting.  Neurological:  Positive for dizziness. Negative for headaches.       Tingling in right  hand- intermittent   Psychiatric/Behavioral:  Negative for confusion.      Physical Exam Triage Vital Signs ED Triage Vitals  Encounter Vitals Group     BP 03/11/24 1007 131/83     Girls Systolic BP Percentile --      Girls Diastolic BP Percentile --      Boys Systolic BP Percentile --      Boys Diastolic BP Percentile --      Pulse Rate 03/11/24 1007 85     Resp 03/11/24 1007 17     Temp 03/11/24 1007 97.8 F (36.6 C)     Temp Source 03/11/24 1007 Oral     SpO2 03/11/24 1007 95 %     Weight --      Height --      Head Circumference --      Peak Flow --      Pain Score 03/11/24 1008 0     Pain Loc --      Pain Education --      Exclude from Growth Chart --    No data found.  Updated Vital Signs BP 131/83 (BP Location: Right Arm)   Pulse 85   Temp 97.8 F (36.6 C) (Oral)   Resp 17   SpO2 95%   Visual Acuity Right Eye Distance:   Left Eye Distance:   Bilateral Distance:    Right Eye Near:   Left Eye Near:    Bilateral Near:     Physical Exam Vitals  reviewed.  Constitutional:      General: She is awake. She is not in acute distress.    Appearance: Normal appearance. She is well-developed and well-groomed. She is not ill-appearing, toxic-appearing or diaphoretic.  HENT:     Head: Normocephalic and atraumatic.     Right Ear: Hearing, tympanic membrane and ear canal normal.     Left Ear: Hearing, tympanic membrane and ear canal normal.     Mouth/Throat:     Lips: Pink.     Mouth: Mucous membranes are moist.     Pharynx: Oropharynx is clear. Uvula midline.  Eyes:     General: Lids are normal. Gaze aligned appropriately.     Extraocular Movements: Extraocular movements intact.     Right eye: No nystagmus.     Left eye: No nystagmus.     Conjunctiva/sclera: Conjunctivae normal.  Pulmonary:     Effort: Pulmonary effort is normal.  Neurological:     Mental Status: She is alert and oriented to person, place, and time.  Psychiatric:        Attention and Perception: Attention and perception normal.        Mood and Affect: Mood and affect normal.        Speech: Speech normal.        Behavior: Behavior normal. Behavior is cooperative.      UC Treatments / Results  Labs (all labs ordered are listed, but only abnormal results are displayed) Labs Reviewed - No data to display  EKG   Radiology No results found.  Procedures Procedures (including critical care time)  Medications Ordered in UC Medications - No data to display  Initial Impression / Assessment and Plan / UC Course  I have reviewed the triage vital signs and the nursing notes.  Pertinent labs & imaging results that were available during my care of  the patient were reviewed by me and considered in my medical decision making (see chart for details).      Final Clinical Impressions(s) / UC Diagnoses   Final diagnoses:  Dizziness and giddiness   Vertigo and Eustachian tube dysfunction Acute onset of dizziness and unsteadiness, exacerbated by head movements and  bending over. Intermittent tingling in the right hand over the past three days. Ear pain and sensation of fullness post-shower suggest possible Eustachian tube dysfunction. Differential includes vertigo and Eustachian tube dysfunction due to inner ear disturbance. No recent illness, blood loss, or significant changes in blood pressure. Recent blood work was normal, reducing likelihood of anemia or electrolyte imbalance. - Instructed on Epley maneuver for vertigo management. - Prescribed meclizine  for dizziness and nausea, with caution regarding drowsiness. - Recommended Flonase and an antihistamine (Claritin or Zyrtec) for Eustachian tube dysfunction. - Advised slow and methodical position changes to minimize dizziness. - Instructed to sit down safely if feeling dizzy or unsteady. - Advised to keep a phone nearby for safety in case of falls. - Instructed to follow up with primary care doctor if symptoms do not improve in 1-2 weeks or worsen, for potential referral to vestibular rehab.    Discharge Instructions      VISIT SUMMARY:  You came in today because you were experiencing dizziness and tingling in your right hand. The dizziness started this morning and gets worse with head movements, making you feel unsteady. You also mentioned some ear pain and a sensation of water in your ear after showering. Your recent blood work was normal.  YOUR PLAN:  -VERTIGO: Vertigo is a condition where you feel like you or your surroundings are spinning. It can be caused by problems in the inner ear. You were instructed on how to perform the Epley maneuver to help manage your vertigo. You were also prescribed meclizine  to help with dizziness and nausea, but be cautious as it can cause drowsiness. Additionally, you should change positions slowly and methodically to minimize dizziness, and sit down safely if you feel dizzy or unsteady. Keep a phone nearby in case you need help.  -EUSTACHIAN TUBE DYSFUNCTION:  Eustachian tube dysfunction occurs when the tube connecting your middle ear to the back of your nose doesn't work properly, causing ear pain and a sensation of fullness. You were recommended to use Flonase and an antihistamine like Claritin or Zyrtec to help with this issue.  INSTRUCTIONS:  Please follow up with your primary care doctor if your symptoms do not improve in 1-2 weeks or if they worsen. You may need a referral to vestibular rehab if your symptoms persist.     ED Prescriptions     Medication Sig Dispense Auth. Provider   meclizine  (ANTIVERT ) 25 MG tablet Take 1 tablet (25 mg total) by mouth 3 (three) times daily as needed for dizziness. 30 tablet Dewitt Judice E, PA-C      PDMP not reviewed this encounter.     [1]  Social History Tobacco Use   Smoking status: Never   Smokeless tobacco: Never  Vaping Use   Vaping status: Never Used  Substance Use Topics   Alcohol use: Yes    Alcohol/week: 1.0 standard drink of alcohol    Types: 1 Glasses of wine per week    Comment: one or two glasses of wine a couple times a week   Drug use: No     Demiana Crumbley, Rocky BRAVO, PA-C 03/11/24 1221

## 2024-03-11 NOTE — ED Notes (Signed)
 Pt put back into lobby. Well-appearing. Vitals are stable. Gait is steady.

## 2024-03-11 NOTE — ED Triage Notes (Addendum)
 Pt presents with complaints of dizziness and right hand tingling. Dizziness started this morning. Right hand tingling began three days ago, no radiation. No pain. Feels very unsteady on ambulation. VSS in triage. Steady gait to triage room. No medications taken PTA for symptoms reported.

## 2024-03-11 NOTE — Discharge Instructions (Addendum)
 VISIT SUMMARY:  You came in today because you were experiencing dizziness and tingling in your right hand. The dizziness started this morning and gets worse with head movements, making you feel unsteady. You also mentioned some ear pain and a sensation of water in your ear after showering. Your recent blood work was normal.  YOUR PLAN:  -VERTIGO: Vertigo is a condition where you feel like you or your surroundings are spinning. It can be caused by problems in the inner ear. You were instructed on how to perform the Epley maneuver to help manage your vertigo. You were also prescribed meclizine  to help with dizziness and nausea, but be cautious as it can cause drowsiness. Additionally, you should change positions slowly and methodically to minimize dizziness, and sit down safely if you feel dizzy or unsteady. Keep a phone nearby in case you need help.  -EUSTACHIAN TUBE DYSFUNCTION: Eustachian tube dysfunction occurs when the tube connecting your middle ear to the back of your nose doesn't work properly, causing ear pain and a sensation of fullness. You were recommended to use Flonase and an antihistamine like Claritin or Zyrtec to help with this issue.  INSTRUCTIONS:  Please follow up with your primary care doctor if your symptoms do not improve in 1-2 weeks or if they worsen. You may need a referral to vestibular rehab if your symptoms persist.

## 2024-03-12 NOTE — Telephone Encounter (Signed)
 Noted

## 2024-03-20 ENCOUNTER — Encounter: Payer: Self-pay | Admitting: Internal Medicine

## 2024-03-20 ENCOUNTER — Other Ambulatory Visit: Payer: Self-pay

## 2024-03-20 DIAGNOSIS — K219 Gastro-esophageal reflux disease without esophagitis: Secondary | ICD-10-CM

## 2024-03-20 DIAGNOSIS — K224 Dyskinesia of esophagus: Secondary | ICD-10-CM

## 2024-04-05 ENCOUNTER — Encounter: Payer: Self-pay | Admitting: Internal Medicine

## 2024-04-05 ENCOUNTER — Ambulatory Visit: Admitting: Internal Medicine

## 2024-04-05 VITALS — BP 95/59 | HR 85 | Temp 98.0°F | Resp 12 | Ht <= 58 in | Wt 119.4 lb

## 2024-04-05 DIAGNOSIS — K219 Gastro-esophageal reflux disease without esophagitis: Secondary | ICD-10-CM

## 2024-04-05 DIAGNOSIS — D122 Benign neoplasm of ascending colon: Secondary | ICD-10-CM

## 2024-04-05 DIAGNOSIS — R1310 Dysphagia, unspecified: Secondary | ICD-10-CM

## 2024-04-05 DIAGNOSIS — K222 Esophageal obstruction: Secondary | ICD-10-CM | POA: Diagnosis not present

## 2024-04-05 DIAGNOSIS — D123 Benign neoplasm of transverse colon: Secondary | ICD-10-CM

## 2024-04-05 DIAGNOSIS — R194 Change in bowel habit: Secondary | ICD-10-CM

## 2024-04-05 DIAGNOSIS — K224 Dyskinesia of esophagus: Secondary | ICD-10-CM

## 2024-04-05 DIAGNOSIS — K648 Other hemorrhoids: Secondary | ICD-10-CM

## 2024-04-05 DIAGNOSIS — Z8719 Personal history of other diseases of the digestive system: Secondary | ICD-10-CM

## 2024-04-05 DIAGNOSIS — K449 Diaphragmatic hernia without obstruction or gangrene: Secondary | ICD-10-CM | POA: Diagnosis not present

## 2024-04-05 DIAGNOSIS — K5904 Chronic idiopathic constipation: Secondary | ICD-10-CM

## 2024-04-05 DIAGNOSIS — K625 Hemorrhage of anus and rectum: Secondary | ICD-10-CM

## 2024-04-05 DIAGNOSIS — K5730 Diverticulosis of large intestine without perforation or abscess without bleeding: Secondary | ICD-10-CM | POA: Diagnosis not present

## 2024-04-05 DIAGNOSIS — K259 Gastric ulcer, unspecified as acute or chronic, without hemorrhage or perforation: Secondary | ICD-10-CM

## 2024-04-05 MED ORDER — SODIUM CHLORIDE 0.9 % IV SOLN: 500.0000 mL | Freq: Once | INTRAVENOUS | Status: DC

## 2024-04-05 NOTE — Patient Instructions (Signed)
 Handouts given: Polyps, Hemorrhoids, Post Dilation Diet Resume previous diet. Continue present medications.  Await pathology results. Repeat colonoscopy is not recommended for surveillance.  YOU HAD AN ENDOSCOPIC PROCEDURE TODAY AT THE [facility]:   Refer to the procedure report that was given to you for any specific questions about what was found during the examination.  If the procedure report does not answer your questions, please call your gastroenterologist to clarify.  If you requested that your care partner not be given the details of your procedure findings, then the procedure report has been included in a sealed envelope for you to review at your convenience later.  YOU SHOULD EXPECT: Some feelings of bloating in the abdomen. Passage of more gas than usual.  Walking can help get rid of the air that was put into your GI tract during the procedure and reduce the bloating. If you had a lower endoscopy (such as a colonoscopy or flexible sigmoidoscopy) you may notice spotting of blood in your stool or on the toilet paper. If you underwent a bowel prep for your procedure, you may not have a normal bowel movement for a few days.  Please Note:  You might notice some irritation and congestion in your nose or some drainage.  This is from the oxygen used during your procedure.  There is no need for concern and it should clear up in a day or so.  SYMPTOMS TO REPORT IMMEDIATELY:  Following lower endoscopy (colonoscopy or flexible sigmoidoscopy):  Excessive amounts of blood in the stool  Significant tenderness or worsening of abdominal pains  Swelling of the abdomen that is new, acute  Fever of 100F or higher  Following upper endoscopy (EGD)  Vomiting of blood or coffee ground material  New chest pain or pain under the shoulder blades  Painful or persistently difficult swallowing  New shortness of breath  Fever of 100F or higher  Black, tarry-looking stools  For urgent or  emergent issues, a gastroenterologist can be reached at any hour by calling [phone]. Do not use MyChart messaging for urgent concerns.    DIET:  We do recommend a small meal at first, but then you may proceed to your regular diet.  Drink plenty of fluids but you should avoid alcoholic beverages for 24 hours.  ACTIVITY:  You should plan to take it easy for the rest of today and you should NOT DRIVE or use heavy machinery until tomorrow (because of the sedation medicines used during the test).    FOLLOW UP: Our staff will call the number listed on your records the next business day following your procedure.  We will call around 7:15- 8:00 am to check on you and address any questions or concerns that you may have regarding the information given to you following your procedure. If we do not reach you, we will leave a message.     If any biopsies were taken you will be contacted by phone or by letter within the next 1-3 weeks.  Please call us  at [phone] if you have not heard about the biopsies in 3 weeks.    SIGNATURES/CONFIDENTIALITY: You and/or your care partner have signed paperwork which will be entered into your electronic medical record.  These signatures attest to the fact that that the information above on your After Visit Summary has been reviewed and is understood.  Full responsibility of the confidentiality of this discharge information lies with you and/or your care-partner.

## 2024-04-05 NOTE — Progress Notes (Signed)
 Pt's states no medical or surgical changes since previsit or office visit.

## 2024-04-05 NOTE — Op Note (Signed)
 [facility] Patient Name: Heidi Shah Procedure Date: 04/05/2024 10:57 AM MRN: [id number] Endoscopist: Norleen SAILOR. Abran , MD, [id number] Age: 79 Referring MD:  Date of Birth: 1945-12-18 Gender: Female Account #: [id number] Procedure:                Colonoscopy with cold snare polypectomy x 1, biopsy                            polypectomy x 1 Indications:              Rectal bleeding, change in bowel habits. History of                            sessile serrated polyp. Previous examinations 2007,                            2017, 2022 Medicines:                Monitored Anesthesia Care Procedure:                Pre-Anesthesia Assessment:                           - Prior to the procedure, a History and Physical                            was performed, and patient medications and                            allergies were reviewed. The patient's tolerance of                            previous anesthesia was also reviewed. The risks                            and benefits of the procedure and the sedation                            options and risks were discussed with the patient.                            All questions were answered, and informed consent                            was obtained. Prior Anticoagulants: The patient has                            taken no anticoagulant or antiplatelet agents.                            After reviewing the risks and benefits, the patient                            was deemed in satisfactory condition to undergo the  procedure.                           After obtaining informed consent, the colonoscope                            was passed under direct vision. Throughout the                            procedure, the patient's blood pressure, pulse, and                            oxygen saturations were monitored continuously. The                            CF [id number] #[id number] was introduced through the  anus                            and advanced to the the cecum, identified by                            appendiceal orifice and ileocecal valve. The                            ileocecal valve, appendiceal orifice, and rectum                            were photographed. The quality of the bowel                            preparation was excellent. The colonoscopy was                            performed without difficulty. The patient tolerated                            the procedure well. The bowel preparation used was                            SUPREP via split dose instruction. Scope In: 11:13:53 AM Scope Out: 11:26:59 AM Scope Withdrawal Time: 0 hours 9 minutes 18 seconds  Total Procedure Duration: 0 hours 13 minutes 6 seconds  Findings:                 A 5 mm polyp was found in the ascending colon. The                            polyp was removed with a cold snare. Resection and                            retrieval were complete.                           A 1 mm polyp was found in the hepatic flexure. The  polyp was removed with a jumbo cold forceps.                            Resection and retrieval were complete.                           Multiple diverticula were found in the left colon.                           Internal hemorrhoids were found during                            retroflexion. The hemorrhoids were small.                           The exam was otherwise without abnormality on                            direct and retroflexion views. Complications:            No immediate complications. Estimated blood loss:                            None. Estimated Blood Loss:     Estimated blood loss: none. Impression:               - One 5 mm polyp in the ascending colon, removed                            with a cold snare. Resected and retrieved.                           - One 1 mm polyp at the hepatic flexure, removed                             with a jumbo cold forceps. Resected and retrieved.                           - Diverticulosis in the left colon.                           - Internal hemorrhoids.                           - The examination was otherwise normal on direct                            and retroflexion views. Recommendation:           - Repeat colonoscopy is not recommended for                            surveillance.                           - Patient has a contact number available for  emergencies. The signs and symptoms of potential                            delayed complications were discussed with the                            patient. Return to normal activities tomorrow.                            Written discharge instructions were provided to the                            patient.                           - Resume previous diet.                           - Continue present medications.                           - Await pathology results. Norleen SAILOR. Abran, MD 04/05/2024 11:33:58 AM This report has been signed electronically.

## 2024-04-05 NOTE — Progress Notes (Signed)
 Sedate, gd SR, tolerated procedure well, VSS, report to RN

## 2024-04-05 NOTE — Op Note (Signed)
 [facility] Patient Name: Heidi Shah Procedure Date: 04/05/2024 11:06 AM MRN: [id number] Endoscopist: Norleen SAILOR. Abran , MD, [id number] Age: 79 Referring MD:  Date of Birth: 04/20/1945 Gender: Female Account #: [id number] Procedure:                Upper GI endoscopy with balloon dilation of the                            esophagus. 18, 19, 20 mm Indications:              Dysphagia, GERD, pharyngeal secretions with throat                            clearing Medicines:                Monitored Anesthesia Care Procedure:                Pre-Anesthesia Assessment:                           - Prior to the procedure, a History and Physical                            was performed, and patient medications and                            allergies were reviewed. The patient's tolerance of                            previous anesthesia was also reviewed. The risks                            and benefits of the procedure and the sedation                            options and risks were discussed with the patient.                            All questions were answered, and informed consent                            was obtained. Prior Anticoagulants: The patient has                            taken no anticoagulant or antiplatelet agents.                            After reviewing the risks and benefits, the patient                            was deemed in satisfactory condition to undergo the                            procedure.  After obtaining informed consent, the endoscope was                            passed under direct vision. Throughout the                            procedure, the patient's blood pressure, pulse, and                            oxygen saturations were monitored continuously. The                            Endoscope was introduced through the mouth, and                            advanced to the second part of duodenum. The upper                             GI endoscopy was accomplished without difficulty.                            The patient tolerated the procedure well. Scope In: Scope Out: Findings:                 One benign-appearing, ringlike intrinsic moderate                            stenosis was found 34 cm from the incisors. This                            stenosis measured 1.5 cm (inner diameter). A TTS                            dilator was passed through the scope. Dilation with                            an 18-19-20 mm balloon dilator was performed to 20                            mm. No mucosal disruption.                           The exam of the esophagus was otherwise normal.                           The stomach was normal except for large hiatal                            hernia.                           The examined duodenum was normal.                           The cardia and gastric  fundus were normal on                            retroflexion. Complications:            No immediate complications. Estimated Blood Loss:     Estimated blood loss: none. Impression:               - Benign-appearing esophageal stenosis. Dilated.                           - Normal stomach with large hiatal hernia.                           - Normal examined duodenum.                           - No specimens collected. Recommendation:           1. Patient has a contact number available for                            emergencies. The signs and symptoms of potential                            delayed complications were discussed with the                            patient. Return to normal activities tomorrow.                            Written discharge instructions were provided to the                            patient.                           2. Post dilation diet.                           3. Continue present medications.                           4. Return to the care of your primary provider Norleen SAILOR.  Abran, MD 04/05/2024 11:45:54 AM This report has been signed electronically.

## 2024-04-05 NOTE — Progress Notes (Signed)
 Called to room to assist during endoscopic procedure.  Patient ID and intended procedure confirmed with present staff. Received instructions for my participation in the procedure from the performing physician.

## 2024-04-05 NOTE — Progress Notes (Signed)
 Expand All Collapse All       Ellouise Console, PA-C [address] [place], [place]  [address] Phone: [phone]     Primary Care Physician: Micheal Wolm ORN, MD   Primary Gastroenterologist:  Ellouise Console, PA-C / Norleen Kiang, MD    Chief Complaint: Rectal bleeding    HPI:   Discussed the use of AI scribe software for clinical note transcription with the patient, who gave verbal consent to proceed.   History of Present Illness Heidi Shah is a 79 year old female, established patient of Dr. Kiang, with GERD, dysphagia, and bowel irregularities who presents for evaluation of rectal bleeding and bowel issues.   Rectal bleeding - Intermittent rectal bleeding began in October and persisted for about one month - Bleeding is associated with bowel movements and may be related to straining - Bleeding ceases for several months before resuming - Last colonoscopy in March 2022 showed diverticulosis, no colon polyps - She has not had any more rectal bleeding since end of October.   Bowel irregularities - Irregular bowel movements with frequent episodes of leaky bowel and loose strands - She has also had constipation with hard stools with straining. - Has a feeling of incomplete bowel evacuation.  Small thin stools. - History of irritable bowel syndrome and bowel irregularities for many years. - Only three solid bowel movements in the past three months - Occasional pain primarily due to gas and bloating - No significant abdominal pain - No current medication for bowel issues   Dysphagia and throat discomfort - Occasional difficulty swallowing, particularly with liquids, not constant - Persistent throat discomfort and frequent throat clearing   Gastroesophageal reflux symptoms - GERD controlled with omeprazole  40 mg daily - No current symptoms of acid reflux   Relevant gastrointestinal history - History of peptic stricture, gastric ulcers, and multiple esophageal  dilations    06/2023 barium swallow with tablet: Mild intermittent dysmotility with spontaneous gastroesophageal reflux. Small sliding hiatal hernia. No evidence of esophageal stricture or other significant mucosal abnormality.   06/16/20 colonoscopy by Dr. Kiang with diverticulosis in the left colon and otherwise normal, no repeat recommended    06/04/2022 EGD by Dr. Kiang with Agapito dilation to 20 French. Findings of 1 benign-appearing esophageal stenosis which was dilated.          Current Outpatient Medications  Medication Sig Dispense Refill   alendronate  (FOSAMAX ) 70 MG tablet Take 1 tablet (70 mg total) by mouth every 7 (seven) days. Take with a full glass of water on an empty stomach. 4 tablet 11   Biotin w/ Vitamins C & E (HAIR SKIN & NAILS GUMMIES PO) Take 2 each by mouth 4 (four) times a week.       Calcium Carb-Cholecalciferol (CALCIUM 600+D3 PO) Take 1 tablet by mouth daily.       cetirizine (ZYRTEC) 10 MG chewable tablet Chew 10 mg by mouth every morning.       Collagen-Vitamin C-Biotin (COLLAGEN PO) Take 1 Dose by mouth daily.       cyanocobalamin (VITAMIN B12) 1000 MCG tablet Take 1,000 mcg by mouth daily.       diltiazem  (CARDIZEM  CD) 180 MG 24 hr capsule Take 1 capsule (180 mg total) by mouth daily. 30 capsule 5   famotidine  (PEPCID ) 40 MG tablet Take 1 tablet (40 mg total) by mouth at bedtime. 90 tablet 3   Ginkgo Biloba 60 MG CAPS Take 60 mg by mouth daily.  lidocaine  (LIDODERM ) 5 % Place 1 patch onto the skin daily. Remove & Discard patch within 12 hours or as directed by MD 30 patch 0   lisinopril -hydrochlorothiazide  (ZESTORETIC ) 20-12.5 MG tablet TAKE ONE TABLET BY MOUTH ONE TIME DAILY 90 tablet 0   Multiple Vitamin (MULTI-VITAMIN) tablet Take 1 tablet by mouth 4 (four) times a week.       Na Sulfate-K Sulfate-Mg Sulfate concentrate (SUPREP) 17.5-3.13-1.6 GM/177ML SOLN Take 1 kit (354 mLs total) by mouth once for 1 dose. 354 mL 0   pantoprazole  (PROTONIX ) 40 MG  tablet Take 1 tablet (40 mg total) by mouth daily before breakfast. 90 tablet 3   vitamin C (ASCORBIC ACID) 500 MG tablet Take 500 mg by mouth 4 (four) times a week.       VYZULTA 0.024 % SOLN Place 1 drop into both eyes at bedtime.          No current facility-administered medications for this visit.             Allergies as of 03/08/2024 - Review Complete 03/08/2024  Allergen Reaction Noted   Nickel sulfate [nickel] Rash 04/17/2018   Beeswax   04/17/2018   Cetrimonium chloride [cetrimide]   04/17/2018   Lactose intolerance (gi) Diarrhea 12/13/2022   Methylisothiazolinone   04/17/2018   Neomycin sulfate [neomycin]   04/17/2018   Penicillins   11/23/2012   Propolis   04/17/2018          Past Medical History:  Diagnosis Date   Allergy      Anxiety     Arthritis     Borderline glaucoma     Cataract      surgery - Bilateral cataracts removed   Cervical spondylolysis     Chicken pox     Colon polyps     Depression     Diverticulitis     Diverticulosis     Esophageal spasm     Esophageal stricture     Family history of adverse reaction to anesthesia      difficult time waking mother up   GERD (gastroesophageal reflux disease)     Heart murmur     Hypertension     Osteoporosis     Traumatic complete tear of left rotator cuff                 Past Surgical History:  Procedure Laterality Date   cataract surgery Bilateral 1996   COLONOSCOPY   2022   EYE SURGERY   2000    detached L retina   HERNIA REPAIR Bilateral 1990    inguinal   NECK SURGERY        age 59-trimmed a muscle due to not being able to hold her head up   POLYPECTOMY   2022   REVERSE SHOULDER ARTHROPLASTY Left 12/16/2022    Procedure: LEFT REVERSE SHOULDER ARTHROPLASTY;  Surgeon: Genelle Standing, MD;  Location: [facility];  Service: Orthopedics;  Laterality: Left;   SHOULDER ARTHROSCOPY WITH ROTATOR CUFF REPAIR AND OPEN BICEPS TENODESIS Left 12/10/2021    Procedure: LEFT SHOULDER ARTHROSCOPY WITH  ROTATOR CUFF REPAIR AND OPEN BICEPS TENODESIS;  Surgeon: Genelle Standing, MD;  Location: [facility];  Service: Orthopedics;  Laterality: Left;   UPPER GASTROINTESTINAL ENDOSCOPY   2022          Review of Systems:    All systems reviewed and negative except where noted in HPI.      Physical Exam:  BP 124/82   Pulse 86  Ht 4' 10 (1.473 m)   Wt 119 lb 4 oz (54.1 kg)   BMI 24.92 kg/m  No LMP recorded. Patient is postmenopausal.   General: Well-nourished, well-developed in no acute distress.  Lungs: Clear to auscultation bilaterally. Non-labored. Heart: Regular rate and rhythm, no murmurs rubs or gallops.  Abdomen: Bowel sounds are normal; Abdomen is Soft; No hepatosplenomegaly, masses or hernias;  No Abdominal Tenderness; No guarding or rebound tenderness. Neuro: Alert and oriented x 3.  Grossly intact.  Psych: Alert and cooperative, normal mood and affect.     Imaging Studies: Imaging Results  No results found.     Labs: CBC Labs (Brief)          Component Value Date/Time    WBC 5.0 03/08/2024 1120    RBC 4.25 03/08/2024 1120    HGB 13.3 03/08/2024 1120    HCT 39.3 03/08/2024 1120    PLT 295.0 03/08/2024 1120    MCV 92.3 03/08/2024 1120    MCH 31.0 12/04/2021 0845    MCHC 33.7 03/08/2024 1120    RDW 13.5 03/08/2024 1120    LYMPHSABS 1.8 03/08/2024 1120    MONOABS 0.6 03/08/2024 1120    EOSABS 0.1 03/08/2024 1120    BASOSABS 0.1 03/08/2024 1120        CMP     Labs (Brief)           Component Value Date/Time    NA 136 04/04/2023 1152    K 3.6 04/04/2023 1152    CL 98 04/04/2023 1152    CO2 29 04/04/2023 1152    GLUCOSE 94 04/04/2023 1152    BUN 19 04/04/2023 1152    CREATININE 0.62 04/04/2023 1152    CREATININE 0.56 (L) 02/25/2020 0903    CALCIUM 10.0 04/04/2023 1152    PROT 7.1 04/04/2023 1152    ALBUMIN 4.6 04/04/2023 1152    AST 20 04/04/2023 1152    ALT 10 04/04/2023 1152    ALKPHOS 68 04/04/2023 1152    BILITOT 0.4 04/04/2023 1152    GFRNONAA >60  12/04/2021 0845    GFRAA   01/02/2010 0751      >60        The eGFR has been calculated using the MDRD equation. This calculation has not been validated in all clinical situations. eGFR's persistently <60 mL/min signify possible Chronic Kidney Disease.          Assessment and Plan:    AARIAN CLEAVER is a 79 y.o. y/o female presents for: Assessment & Plan 1.  Irritable bowel syndrome with constipation Intermittent constipation and loose stools with occasional rectal bleeding likely due to hemorrhoids. Previous colonoscopy showed diverticulosis without polyps. Symptoms suggestive of IBS-C. - Recommended daily fiber supplement (Metamucil). - Advised use of Miralax. - Scheduled repeat colonoscopy. - Ordered blood work for hemoglobin levels.   2.  Rectal bleeding Intermittent rectal bleeding likely due to hemorrhoids. Previous colonoscopy showed diverticulosis without polyps. - Scheduled repeat colonoscopy. - Ordered blood work (CBC) to screen for anemia. - Scheduling Colonoscopy I discussed risks of colonoscopy with patient to include risk of bleeding, colon perforation, and risk of sedation.  Patient expressed understanding and agrees to proceed with colonoscopy.    3.  Gastroesophageal reflux disease Chronic GERD with throat discomfort and occasional dysphagia. Current omeprazole  treatment ineffective. - Discontinued omeprazole . - Started pantoprazole  once daily before breakfast. - Added famotidine  40 mg before bedtime. - GERD diet.   4.  Esophageal dysmotility with intermittent dysphagia Intermittent  dysphagia, particularly with liquids. Previous barium swallow test showed no stricture. - Reassurance. -  Recommend eat small frequent meals.  Eat sitting up.  Eat slowly.   Ellouise Console, PA-C

## 2024-04-06 ENCOUNTER — Ambulatory Visit (INDEPENDENT_AMBULATORY_CARE_PROVIDER_SITE_OTHER): Payer: Medicare HMO | Admitting: Family Medicine

## 2024-04-06 ENCOUNTER — Encounter: Payer: Self-pay | Admitting: Family Medicine

## 2024-04-06 ENCOUNTER — Ambulatory Visit: Payer: Self-pay | Admitting: Family Medicine

## 2024-04-06 ENCOUNTER — Telehealth: Payer: Self-pay | Admitting: *Deleted

## 2024-04-06 VITALS — BP 120/74 | HR 76 | Temp 98.2°F | Ht 59.06 in | Wt 117.0 lb

## 2024-04-06 DIAGNOSIS — Z0000 Encounter for general adult medical examination without abnormal findings: Secondary | ICD-10-CM

## 2024-04-06 DIAGNOSIS — Z23 Encounter for immunization: Secondary | ICD-10-CM

## 2024-04-06 LAB — LIPID PANEL
Cholesterol: 255 mg/dL — ABNORMAL HIGH (ref 28–200)
HDL: 74.3 mg/dL
LDL Cholesterol: 167 mg/dL — ABNORMAL HIGH (ref 10–99)
NonHDL: 181.16
Total CHOL/HDL Ratio: 3
Triglycerides: 70 mg/dL (ref 10.0–149.0)
VLDL: 14 mg/dL (ref 0.0–40.0)

## 2024-04-06 LAB — COMPREHENSIVE METABOLIC PANEL WITH GFR
ALT: 11 U/L (ref 3–35)
AST: 20 U/L (ref 5–37)
Albumin: 4.5 g/dL (ref 3.5–5.2)
Alkaline Phosphatase: 41 U/L (ref 39–117)
BUN: 11 mg/dL (ref 6–23)
CO2: 30 meq/L (ref 19–32)
Calcium: 9.1 mg/dL (ref 8.4–10.5)
Chloride: 99 meq/L (ref 96–112)
Creatinine, Ser: 0.65 mg/dL (ref 0.40–1.20)
GFR: 85.68 mL/min
Glucose, Bld: 85 mg/dL (ref 70–99)
Potassium: 3.9 meq/L (ref 3.5–5.1)
Sodium: 135 meq/L (ref 135–145)
Total Bilirubin: 0.4 mg/dL (ref 0.2–1.2)
Total Protein: 6.9 g/dL (ref 6.0–8.3)

## 2024-04-06 NOTE — Telephone Encounter (Signed)
 Left message on f/u call

## 2024-04-06 NOTE — Progress Notes (Signed)
 "  Established Patient Office Visit  Subjective   Patient ID: Heidi Shah, female    DOB: July 08, 1945  Age: 79 y.o. MRN: [id number]  Chief Complaint  Patient presents with   Annual Exam    HPI    Zoiey is seen today for physical exam.  She had EGD and colonoscopy yesterday that went well overall.  Mild esophageal stricture.  She has chronic GERD and is on chronic PPI therapy.  Her other chronic problems include history of hypertension, osteoarthritis involving multiple joints, history of diverticulitis of the colon.  Generally doing well.  No recent falls.  Still has occasional palpitations but not regularly.  Aortic murmur with echo couple years ago showing only mild aortic valve sclerosis but no stenosis.  She does have some intermittent right hand numbness without weakness.  No severe pain.  No neck pain.  Health maintenance reviewed:  Health Maintenance  Topic Date Due   COVID-19 Vaccine (4 - 2025-26 season) 11/28/2023   Influenza Vaccine  06/26/2024 (Originally 10/28/2023)   Hepatitis C Screening  02/12/2026 (Originally 02/06/1964)   Pneumococcal Vaccine: 50+ Years (3 of 3 - PCV20 or PCV21) 03/10/2039 (Originally 01/02/2019)   Medicare Annual Wellness (AWV)  06/09/2024   DTaP/Tdap/Td (3 - Td or Tdap) 02/25/2026   Colonoscopy  04/05/2029   Bone Density Scan  Completed   Zoster Vaccines- Shingrix  Completed   Meningococcal B Vaccine  Aged Out   Mammogram  Discontinued   - She initially declined flu vaccine but then consents -No history of RSV.  She declines at this time. -Just had EGD and colonoscopy yesterday.  Aged out of further studies at this point.  Family history-reviewed and unchanged.  Mom with history of A-fib.  She ultimately died age 81 of sepsis complications.  Father had renal cell carcinoma and history of CAD.  No siblings.  Social history-she is divorced.  Retired tourist information centre manager.  She smoked only briefly in her 76s in college.  No regular  alcohol.  2 children and 4 grandchildren  Past Medical History:  Diagnosis Date   Allergy     Anxiety    Arthritis    Borderline glaucoma    Cataract    surgery - Bilateral cataracts removed   Cervical spondylolysis    Chicken pox    Colon polyps    Depression    Diverticulitis    Diverticulosis    Esophageal spasm    Esophageal stricture    Family history of adverse reaction to anesthesia    difficult time waking mother up   GERD (gastroesophageal reflux disease)    Heart murmur    Hypertension    Osteoporosis    Traumatic complete tear of left rotator cuff    Past Surgical History:  Procedure Laterality Date   cataract surgery Bilateral 1996   COLONOSCOPY  2022   EYE SURGERY  2000   detached L retina   HERNIA REPAIR Bilateral 1990   inguinal   NECK SURGERY     age 69-trimmed a muscle due to not being able to hold her head up   POLYPECTOMY  2022   REVERSE SHOULDER ARTHROPLASTY Left 12/16/2022   Procedure: LEFT REVERSE SHOULDER ARTHROPLASTY;  Surgeon: Genelle Standing, MD;  Location: [facility];  Service: Orthopedics;  Laterality: Left;   SHOULDER ARTHROSCOPY WITH ROTATOR CUFF REPAIR AND OPEN BICEPS TENODESIS Left 12/10/2021   Procedure: LEFT SHOULDER ARTHROSCOPY WITH ROTATOR CUFF REPAIR AND OPEN BICEPS TENODESIS;  Surgeon: Genelle Standing, MD;  Location: [facility];  Service: Orthopedics;  Laterality: Left;   UPPER GASTROINTESTINAL ENDOSCOPY  2022    reports that she has never smoked. She has never used smokeless tobacco. She reports current alcohol use of about 1.0 standard drink of alcohol per week. She reports that she does not use drugs. family history includes COPD in her mother; Cancer in her father; Crohn's disease in her mother; Heart disease (age of onset: 75) in her mother; Heart disease (age of onset: 54) in her father. Allergies[1]  Review of Systems  Constitutional:  Negative for chills, fever, malaise/fatigue and weight loss.  HENT:  Negative for hearing loss.    Eyes:  Negative for blurred vision and double vision.  Respiratory:  Negative for cough and shortness of breath.   Cardiovascular:  Positive for palpitations. Negative for chest pain and leg swelling.  Gastrointestinal:  Negative for abdominal pain, blood in stool, constipation and diarrhea.  Genitourinary:  Negative for dysuria.  Skin:  Negative for rash.  Neurological:  Negative for dizziness, speech change, seizures, loss of consciousness and headaches.  Psychiatric/Behavioral:  Negative for depression.       Objective:     BP 120/74   Pulse 76   Temp 98.2 F (36.8 C) (Oral)   Ht 4' 11.06 (1.5 m)   Wt 117 lb (53.1 kg)   SpO2 96%   BMI 23.59 kg/m  BP Readings from Last 3 Encounters:  04/06/24 120/74  04/05/24 (!) 95/59  03/11/24 131/83   Wt Readings from Last 3 Encounters:  04/06/24 117 lb (53.1 kg)  04/05/24 119 lb 6.4 oz (54.2 kg)  03/08/24 119 lb 4 oz (54.1 kg)      Physical Exam Vitals reviewed.  Constitutional:      General: She is not in acute distress.    Appearance: She is well-developed. She is not ill-appearing.  HENT:     Head: Normocephalic and atraumatic.     Ears:     Comments: Left ear canal is clear.  She has small amount of cerumen right canal Eyes:     Pupils: Pupils are equal, round, and reactive to light.  Neck:     Thyroid : No thyromegaly.  Cardiovascular:     Rate and Rhythm: Normal rate and regular rhythm.     Heart sounds: Murmur heard.     Comments: 2 out of 6 systolic ejection murmur right upper sternal border Pulmonary:     Effort: No respiratory distress.     Breath sounds: Normal breath sounds. No wheezing or rales.  Abdominal:     General: Bowel sounds are normal. There is no distension.     Palpations: Abdomen is soft. There is no mass.     Tenderness: There is no abdominal tenderness. There is no guarding or rebound.  Musculoskeletal:        General: Normal range of motion.     Cervical back: Normal range of motion and  neck supple.     Right lower leg: No edema.     Left lower leg: No edema.  Lymphadenopathy:     Cervical: No cervical adenopathy.  Skin:    Findings: No rash.  Neurological:     Mental Status: She is alert and oriented to person, place, and time.     Cranial Nerves: No cranial nerve deficit.  Psychiatric:        Behavior: Behavior normal.        Thought Content: Thought content normal.  Judgment: Judgment normal.      No results found for any visits on 04/06/24.    The 10-year ASCVD risk score (Arnett DK, et al., 2019) is: 21.4%    Assessment & Plan:   Problem List Items Addressed This Visit   None Visit Diagnoses       Physical exam    -  Primary   Relevant Orders   Lipid panel   CMP     79 year old female with chronic medical problems as above.  She has hypertension which is currently well-controlled.  We discussed the following health maintenance items  -Discussed flu vaccine and she did consent to getting this - Discussed RSV vaccine which she declines at this time but will consider - Other vaccines up-to-date - Just had colonoscopy and EGD yesterday - She is getting yearly mammograms and due in May - Continue regular weightbearing exercise and resistance training  No follow-ups on file.    Wolm Scarlet, MD     [1]  Allergies Allergen Reactions   Beeswax Other (See Comments)    Per skin test from dermatologist   Cetrimonium Chloride [Cetrimide] Other (See Comments)    Per skin test from dermatologist   Methylisothiazolinone Other (See Comments)    Per skin test from dermatologist   Neomycin Sulfate [Neomycin] Other (See Comments)    Per skin test from dermatologist   Nickel Sulfate [Nickel] Rash    Per skin test from dermatologist   Penicillins Other (See Comments)    Childhood Reaction-Patient remembers fainting   Propolis Other (See Comments)    Per skin test from dermatologist   Lactose Intolerance (Gi) Diarrhea   "

## 2024-04-09 ENCOUNTER — Ambulatory Visit: Payer: Self-pay | Admitting: Internal Medicine

## 2024-04-09 LAB — SURGICAL PATHOLOGY

## 2024-04-11 ENCOUNTER — Other Ambulatory Visit: Payer: Self-pay | Admitting: Family Medicine

## 2024-05-25 ENCOUNTER — Ambulatory Visit: Admitting: Gastroenterology

## 2024-06-15 ENCOUNTER — Ambulatory Visit

## 2025-04-08 ENCOUNTER — Encounter: Admitting: Family Medicine
